# Patient Record
Sex: Female | Born: 1945
Health system: Southern US, Community
[De-identification: ages and names within clinical notes are randomized; demographics above are authoritative.]

## PROBLEM LIST (undated history)

## (undated) DIAGNOSIS — J9610 Chronic respiratory failure, unspecified whether with hypoxia or hypercapnia: Secondary | ICD-10-CM

## (undated) DIAGNOSIS — C801 Malignant (primary) neoplasm, unspecified: Secondary | ICD-10-CM

## (undated) DIAGNOSIS — E785 Hyperlipidemia, unspecified: Secondary | ICD-10-CM

## (undated) DIAGNOSIS — K529 Noninfective gastroenteritis and colitis, unspecified: Secondary | ICD-10-CM

## (undated) DIAGNOSIS — K219 Gastro-esophageal reflux disease without esophagitis: Secondary | ICD-10-CM

## (undated) DIAGNOSIS — J449 Chronic obstructive pulmonary disease, unspecified: Secondary | ICD-10-CM

## (undated) DIAGNOSIS — K449 Diaphragmatic hernia without obstruction or gangrene: Secondary | ICD-10-CM

## (undated) DIAGNOSIS — E039 Hypothyroidism, unspecified: Secondary | ICD-10-CM

## (undated) DIAGNOSIS — E876 Hypokalemia: Secondary | ICD-10-CM

## (undated) DIAGNOSIS — H269 Unspecified cataract: Secondary | ICD-10-CM

## (undated) DIAGNOSIS — I5032 Chronic diastolic (congestive) heart failure: Secondary | ICD-10-CM

## (undated) DIAGNOSIS — I471 Supraventricular tachycardia: Secondary | ICD-10-CM

## (undated) DIAGNOSIS — K222 Esophageal obstruction: Principal | ICD-10-CM

## (undated) DIAGNOSIS — R0902 Hypoxemia: Secondary | ICD-10-CM

## (undated) DIAGNOSIS — K5100 Ulcerative (chronic) pancolitis without complications: Secondary | ICD-10-CM

## (undated) DIAGNOSIS — R609 Edema, unspecified: Secondary | ICD-10-CM

## (undated) HISTORY — PX: CARPAL TUNNEL RELEASE: SHX101

## (undated) HISTORY — DX: Hyperlipidemia, unspecified: E78.5

## (undated) HISTORY — DX: Unspecified cataract: H26.9

## (undated) HISTORY — PX: UPPER GASTROINTESTINAL ENDOSCOPY: SHX188

## (undated) HISTORY — DX: Diaphragmatic hernia without obstruction or gangrene: K44.9

## (undated) HISTORY — DX: Chronic respiratory failure, unspecified whether with hypoxia or hypercapnia: J96.10

## (undated) HISTORY — DX: Esophageal obstruction: K22.2

## (undated) HISTORY — DX: Hypoxemia: R09.02

## (undated) HISTORY — DX: Gastro-esophageal reflux disease without esophagitis: K21.9

## (undated) HISTORY — DX: Ulcerative (chronic) pancolitis without complications: K51.00

## (undated) HISTORY — PX: CHOLECYSTECTOMY: SHX55

## (undated) HISTORY — PX: TONSILLECTOMY: SUR1361

## (undated) HISTORY — PX: LEG SURGERY: SHX1003

## (undated) HISTORY — DX: Hypothyroidism, unspecified: E03.9

## (undated) HISTORY — DX: Chronic obstructive pulmonary disease, unspecified: J44.9

---

## 2004-11-12 ENCOUNTER — Ambulatory Visit: Payer: Self-pay | Admitting: Family Medicine

## 2004-11-12 ENCOUNTER — Inpatient Hospital Stay (HOSPITAL_COMMUNITY): Admission: EM | Admit: 2004-11-12 | Discharge: 2004-11-15 | Payer: Self-pay | Admitting: Emergency Medicine

## 2004-11-12 IMAGING — CR DG CHEST 2V
2 series · 2 of 2 positions shown · non-contrast
Comparison: None.

CLINICAL DATA: Short of breath, cough. 
 CHEST ? 2 VIEW:

[w chest pa]
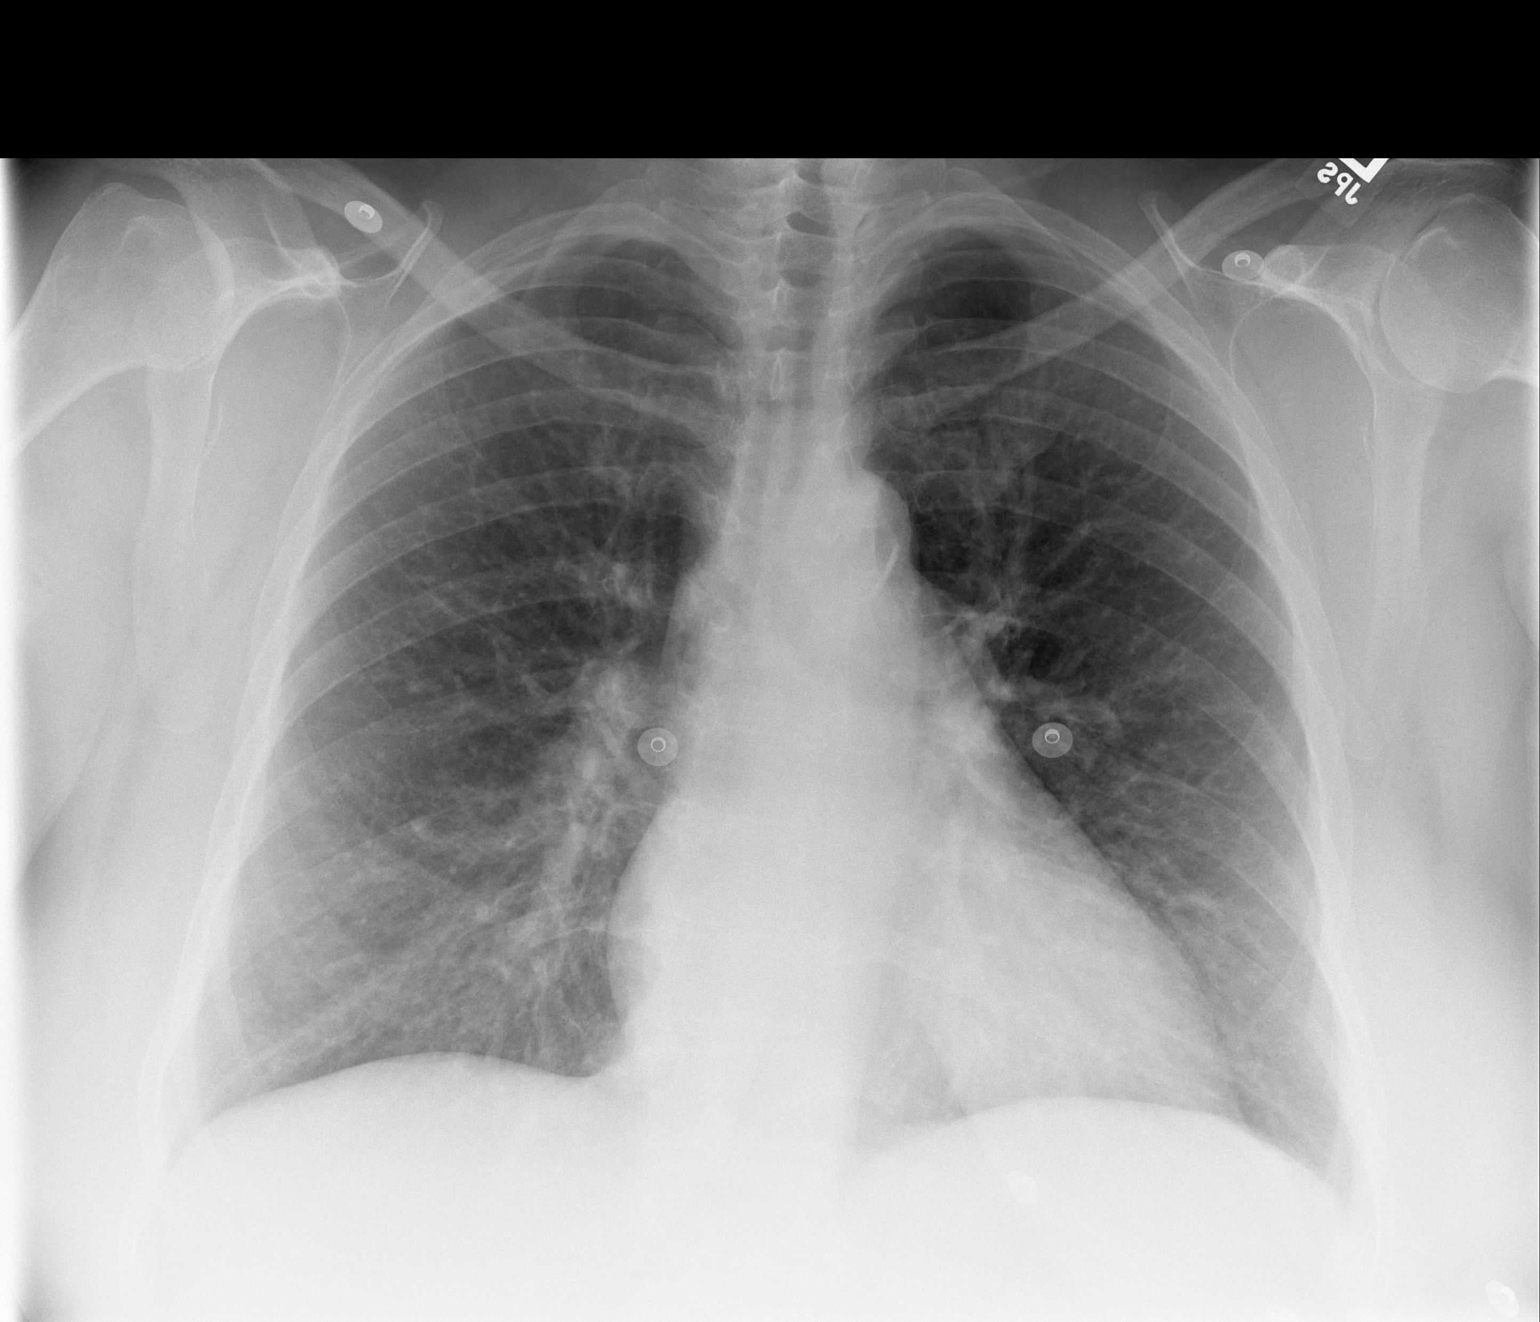

[w chest lat]
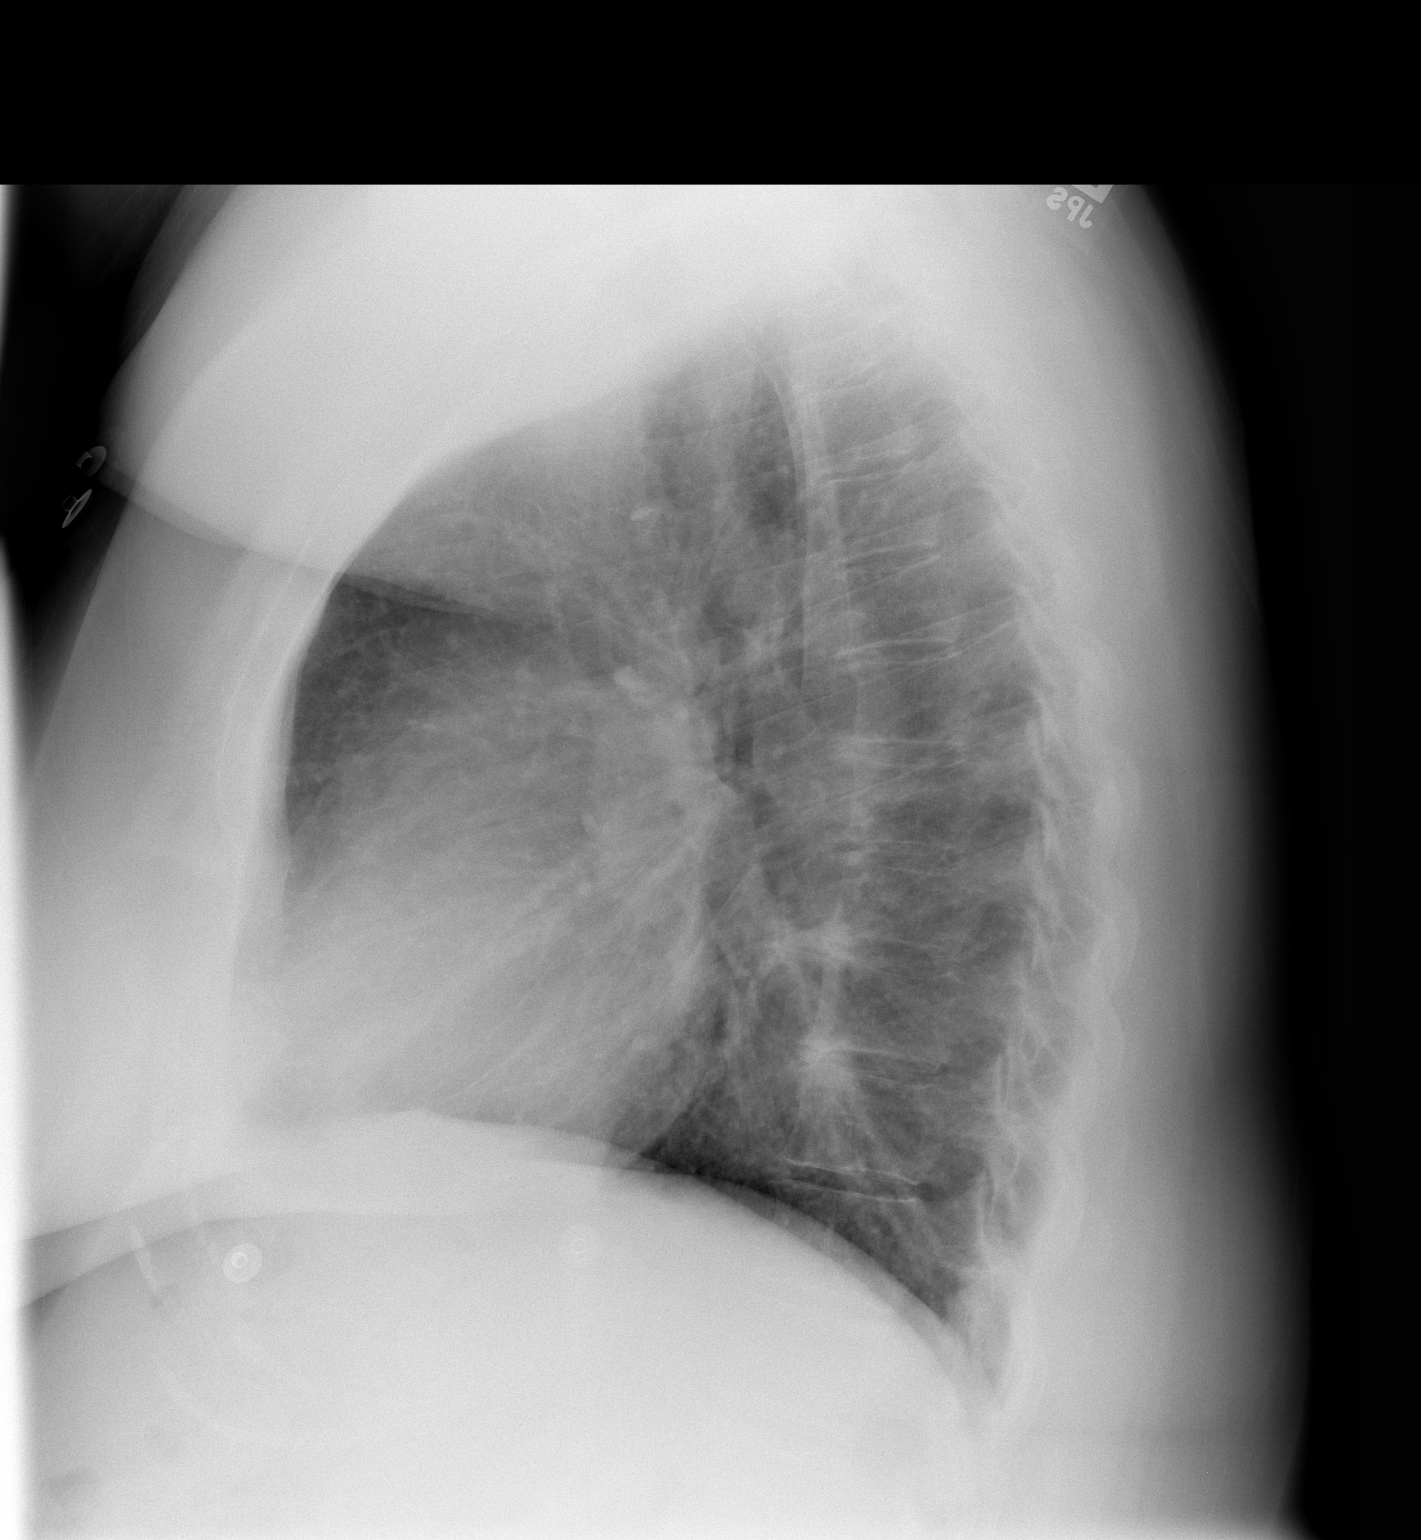

[2 of 2 positions shown; findings below may reference images not displayed]

Heart size upper normal.  There is peribronchial thickening without definite acute airspace disease or pleural fluid.  There is calcification in the aortic arch.
IMPRESSION: Borderline cardiomegaly and bronchitic changes ? no active airspace disease or congestive heart failure.

## 2010-08-29 ENCOUNTER — Encounter (INDEPENDENT_AMBULATORY_CARE_PROVIDER_SITE_OTHER): Payer: Self-pay | Admitting: *Deleted

## 2010-09-04 NOTE — Letter (Signed)
Summary: New Patient letter  [facility]  [place], [place] [address]   Phone: [phone]  Fax: [phone]       08/29/2010 MRN: [id number]  Jamie Montoya [address] [place], [place]  [address]  Dear Jamie Montoya,  Welcome to the Gastroenterology Division at [facility].    You are scheduled to see Dr.  Carlean Purl on 09-06-10 at 10:00A.M. on the 3rd floor at [facility] [employer].  We ask that you try to arrive at our office 15 minutes prior to your appointment time to allow for check-in.  We would like you to complete the enclosed self-administered evaluation form prior to your visit and bring it with you on the day of your appointment.  We will review it with you.  Also, please bring a complete list of all your medications or, if you prefer, bring the medication bottles and we will list them.  Please bring your insurance card so that we may make a copy of it.  If your insurance requires a referral to see a specialist, please bring your referral form from your primary care physician.  Co-payments are due at the time of your visit and may be paid by cash, check or credit card.     Your office visit will consist of a consult with your physician (includes a physical exam), any laboratory testing he/she may order, scheduling of any necessary diagnostic testing (e.g. x-ray, ultrasound, CT-scan), and scheduling of a procedure (e.g. Endoscopy, Colonoscopy) if required.  Please allow enough time on your schedule to allow for any/all of these possibilities.    If you cannot keep your appointment, please call [phone] to cancel or reschedule prior to your appointment date.  This allows [place] the opportunity to schedule an appointment for another patient in need of care.  If you do not cancel or reschedule by 5 p.m. the business day prior to your appointment date, you will be charged a $50.00 late cancellation/no-show fee.    Thank you for choosing [facility] for your medical needs.  We appreciate the opportunity to care for you.  Please visit [place] at our website  to learn more about our practice.                     Sincerely,                                                             The Gastroenterology Division

## 2010-09-06 ENCOUNTER — Ambulatory Visit (INDEPENDENT_AMBULATORY_CARE_PROVIDER_SITE_OTHER): Payer: Medicare Other | Admitting: Internal Medicine

## 2010-09-06 ENCOUNTER — Other Ambulatory Visit: Payer: Medicare Other

## 2010-09-06 ENCOUNTER — Other Ambulatory Visit: Payer: Self-pay | Admitting: Internal Medicine

## 2010-09-06 ENCOUNTER — Encounter: Payer: Self-pay | Admitting: Internal Medicine

## 2010-09-06 DIAGNOSIS — K222 Esophageal obstruction: Secondary | ICD-10-CM | POA: Insufficient documentation

## 2010-09-06 DIAGNOSIS — R1319 Other dysphagia: Secondary | ICD-10-CM

## 2010-09-06 DIAGNOSIS — K5190 Ulcerative colitis, unspecified, without complications: Secondary | ICD-10-CM | POA: Insufficient documentation

## 2010-09-06 DIAGNOSIS — J449 Chronic obstructive pulmonary disease, unspecified: Secondary | ICD-10-CM | POA: Insufficient documentation

## 2010-09-06 DIAGNOSIS — K921 Melena: Secondary | ICD-10-CM

## 2010-09-06 DIAGNOSIS — E66813 Obesity, class 3: Secondary | ICD-10-CM | POA: Insufficient documentation

## 2010-09-06 LAB — CBC WITH DIFFERENTIAL/PLATELET
Basophils Relative: 0.8 % (ref 0.0–3.0)
Eosinophils Absolute: 0.2 10*3/uL (ref 0.0–0.7)
Eosinophils Relative: 2.9 % (ref 0.0–5.0)
Lymphs Abs: 1.8 10*3/uL (ref 0.7–4.0)
MCHC: 33.6 g/dL (ref 30.0–36.0)
Monocytes Relative: 9.7 % (ref 3.0–12.0)
Neutro Abs: 2.9 10*3/uL (ref 1.4–7.7)
WBC: 5.4 10*3/uL (ref 4.5–10.5)

## 2010-09-10 ENCOUNTER — Encounter (AMBULATORY_SURGERY_CENTER): Payer: Medicare Other | Admitting: Internal Medicine

## 2010-09-10 ENCOUNTER — Encounter: Payer: Self-pay | Admitting: Internal Medicine

## 2010-09-10 ENCOUNTER — Other Ambulatory Visit: Payer: Self-pay | Admitting: Internal Medicine

## 2010-09-10 DIAGNOSIS — K222 Esophageal obstruction: Secondary | ICD-10-CM

## 2010-09-10 DIAGNOSIS — K5289 Other specified noninfective gastroenteritis and colitis: Secondary | ICD-10-CM

## 2010-09-10 DIAGNOSIS — D126 Benign neoplasm of colon, unspecified: Secondary | ICD-10-CM

## 2010-09-10 DIAGNOSIS — K219 Gastro-esophageal reflux disease without esophagitis: Secondary | ICD-10-CM

## 2010-09-10 DIAGNOSIS — K449 Diaphragmatic hernia without obstruction or gangrene: Secondary | ICD-10-CM

## 2010-09-10 DIAGNOSIS — K5100 Ulcerative (chronic) pancolitis without complications: Secondary | ICD-10-CM

## 2010-09-10 DIAGNOSIS — K625 Hemorrhage of anus and rectum: Secondary | ICD-10-CM

## 2010-09-10 DIAGNOSIS — R1319 Other dysphagia: Secondary | ICD-10-CM

## 2010-09-10 HISTORY — DX: Gastro-esophageal reflux disease without esophagitis: K21.9

## 2010-09-10 HISTORY — DX: Esophageal obstruction: K22.2

## 2010-09-10 HISTORY — DX: Ulcerative (chronic) pancolitis without complications: K51.00

## 2010-09-10 HISTORY — PX: ESOPHAGOGASTRODUODENOSCOPY: SHX1529

## 2010-09-10 HISTORY — DX: Diaphragmatic hernia without obstruction or gangrene: K44.9

## 2010-09-10 HISTORY — PX: COLONOSCOPY: SHX174
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
## 2010-09-11 NOTE — Letter (Signed)
Summary: [facility] Instructions  [facility]  [place], [place] [address]   Phone: [phone]  Fax: [phone]       Jamie Montoya    [birth year]    MRN: [id number]        Procedure Day /Date: Monday March 19th, 2012     Arrival Time: 2:00pm     Procedure Time: 3:00pm     Location of Procedure:                    _ x_  [place] (4th Floor)                        [place]   Starting 5 days prior to your procedure today do not eat nuts, seeds, popcorn, corn, beans, peas,  salads, or any raw vegetables.  Do not take any fiber supplements (e.g. Metamucil, Citrucel, and Benefiber).  THE DAY BEFORE YOUR PROCEDURE         DATE: 09/09/10  DAY: Sunday  1.  Drink clear liquids the entire day-NO SOLID FOOD  2.  Do not drink anything colored red or purple.  Avoid juices with pulp.  No orange juice.  3.  Drink at least 64 oz. (8 glasses) of fluid/clear liquids during the day to prevent dehydration and help the prep work efficiently.  CLEAR LIQUIDS INCLUDE: Water Jello Ice Popsicles Tea (sugar ok, no milk/cream) Powdered fruit flavored drinks Coffee (sugar ok, no milk/cream) Gatorade Juice: apple, white grape, white cranberry  Lemonade Clear bullion, consomm, broth Carbonated beverages (any kind) Strained chicken noodle soup Hard Candy                             4.  In the morning, mix first dose of MoviPrep solution:    Empty 1 Pouch A and 1 Pouch B into the disposable container    Add lukewarm drinking water to the top line of the container. Mix to dissolve    Refrigerate (mixed solution should be used within 24 hrs)  5.  Begin drinking the prep at 5:00 p.m. The MoviPrep container is divided by 4 marks.   Every 15 minutes drink the solution down to the next mark (approximately 8 oz) until the full liter is complete.   6.  Follow completed prep with 16 oz of clear liquid of your choice (Nothing  red or purple).  Continue to drink clear liquids until bedtime.  7.  Before going to bed, mix second dose of MoviPrep solution:    Empty 1 Pouch A and 1 Pouch B into the disposable container    Add lukewarm drinking water to the top line of the container. Mix to dissolve    Refrigerate  THE DAY OF YOUR PROCEDURE      DATE: 09/10/10 DAY: Monday Beginning at 10:00 a.m. (5 hours before procedure):         1. Every 15 minutes, drink the solution down to the next mark (approx 8 oz) until the full liter is complete.  2. Follow completed prep with 16 oz. of clear liquid of your choice.    3. You may drink clear liquids until 1:00pm (2 HOURS BEFORE PROCEDURE).   MEDICATION INSTRUCTIONS  Unless otherwise instructed, you should take regular prescription medications with a small sip of water   as early as possible the morning of your procedure.  OTHER INSTRUCTIONS  You will need a responsible adult at least 67 years of age to accompany you and drive you home.   This person must remain in the waiting room during your procedure.  Wear loose fitting clothing that is easily removed.  Leave jewelry and other valuables at home.  However, you may wish to bring a book to read or  an iPod/MP3 player to listen to music as you wait for your procedure to start.  Remove all body piercing jewelry and leave at home.  Total time from sign-in until discharge is approximately 2-3 hours.  You should go home directly after your procedure and rest.  You can resume normal activities the  day after your procedure.  The day of your procedure you should not:   Drive   Make legal decisions   Operate machinery   Drink alcohol   Return to work  You will receive specific instructions about eating, activities and medications before you leave.    The above instructions have been reviewed and explained to me by   _______________________    I fully understand and can verbalize these  instructions _____________________________ Date _________

## 2010-09-20 ENCOUNTER — Encounter: Payer: Self-pay | Admitting: Internal Medicine

## 2010-09-20 ENCOUNTER — Telehealth: Payer: Self-pay | Admitting: Internal Medicine

## 2010-09-20 DIAGNOSIS — K5190 Ulcerative colitis, unspecified, without complications: Secondary | ICD-10-CM

## 2010-09-20 DIAGNOSIS — K219 Gastro-esophageal reflux disease without esophagitis: Secondary | ICD-10-CM

## 2010-09-20 DIAGNOSIS — K222 Esophageal obstruction: Secondary | ICD-10-CM | POA: Insufficient documentation

## 2010-09-20 NOTE — Procedures (Signed)
Summary: Colonoscopy  Patient: [name] Note: All result statuses are Final unless otherwise noted.  Tests: (1) Colonoscopy (COL)   COL Colonoscopy           [facility]     [place], [place]  [address]          COLONOSCOPY PROCEDURE REPORT          PATIENT:  Jamie Montoya, Jamie Montoya  MR#:  [id number]     BIRTHDATE:  08-16-1945, 41 yrs. old  GENDER:  female     ENDOSCOPIST:  Gatha Mayer, MD, [facility]          PROCEDURE DATE:  09/10/2010     PROCEDURE:  Colonoscopy with biopsy and snare polypectomy     ASA CLASS:  Class III     INDICATIONS:  hematochezia     MEDICATIONS:   There was residual sedation effect present from     prior procedure., MAC sedation, administered by CRNA, propofol     (Diprivan) 220 mg IV          DESCRIPTION OF PROCEDURE:   After the risks benefits and     alternatives of the procedure were thoroughly explained, informed     consent was obtained.  Digital rectal exam was performed and     revealed no abnormalities.   The LB 180AL [id number] endoscope was     introduced through the anus and advanced to the cecum, which was     identified by both the appendix and ileocecal valve, without     limitations.  The quality of the prep was excellent, using     MoviPrep.  The instrument was then slowly withdrawn as the colon     was fully examined. Insertion: 5:41 minutes and Withdrawal: 9:29     minutes     <<PROCEDUREIMAGES>>          FINDINGS:  Colitis was found in the rectum and sigmoid colon.     Confluent granularity and aphthous ulceration with loss of normal     mucosal pattern except for some patchy changes in rectum. From     rectum to 40 cm (sigmoid). Multiple biopsies were obtained and     sent to pathology.  A diminutive polyp was found in the mid     transverse colon. Polyp was snared without cautery. Retrieval was     successful. snare polyp  Mild diverticulosis was found throughout     the colon.  This was otherwise a  normal examination of the colon.     The ileocecal valve might have been inflamed, could not enter as     redundant colon effect in place with most of scope inserted. This     was biopsied also. Random biopsies of normal colon taken also.     Retroflexed views in the rectum revealed no other findings other     than those already described.    The scope was then withdrawn from     the patient and the procedure completed.          COMPLICATIONS:  None     ENDOSCOPIC IMPRESSION:     1) Colitis in the rectum and sigmoid colon     2) Diminutive polyp in the mid transverse colon     3) Mild diverticulosis throughout the colon     4) Otherwise normal examination except ? inflamed ileocecal  valve (ileum not entered due to redundant colon effect)          RECOMMENDATIONS:     1) Await pathology     2) looks like left-sided ulcerative colitis - start Asacol 800     mg three times a day - prescription sent     REPEAT EXAM:  In for Colonoscopy, pending biopsy results.          Gatha Mayer, MD, [facility]          CC:  Daiva Eves, MD and The Patient          n.     eSIGNED:   Gatha Mayer at 09/10/2010 04:16 PM          Wilburt Finlay, [id number]  Note: An exclamation mark (!) indicates a result that was not dispersed into the flowsheet. Document Creation Date: 09/10/2010 4:16 PM _______________________________________________________________________  (1) Order result status: Final Collection or observation date-time: 09/10/2010 15:57 Requested date-time:  Receipt date-time:  Reported date-time:  Referring Physician:   Ordering Physician: Silvano Rusk [phone]) Specimen Source:  Source: Tawanna Cooler Order Number: [id number] Lab site:

## 2010-09-20 NOTE — Miscellaneous (Signed)
Summary: omeprazole and asacol rxs  Clinical Lists Changes  Medications: Removed medication of MOVIPREP 100 GM  SOLR (PEG-KCL-NACL-NASULF-NA ASC-C) As per prep instructions. Added new medication of OMEPRAZOLE 40 MG CPDR (OMEPRAZOLE) 1 by mouth once daily 30 minutes before breakfast - Signed Added new medication of ASACOL 400 MG TBEC (MESALAMINE) 2 by mouth three times a day - Signed Rx of OMEPRAZOLE 40 MG CPDR (OMEPRAZOLE) 1 by mouth once daily 30 minutes before breakfast;  #30 x 11;  Signed;  Entered by: Gatha Mayer MD, [facility];  Authorized by: Gatha Mayer MD, [facility];  Method used: Electronically to [facility] [address], [place], [place], [place]  [address], Ph: [phone], Fax: [phone] Rx of ASACOL 400 MG TBEC (MESALAMINE) 2 by mouth three times a day;  #180 x 11;  Signed;  Entered by: Gatha Mayer MD, [facility];  Authorized by: Gatha Mayer MD, [facility];  Method used: Electronically to [facility] [address], [place], [place], [place]  [address], Ph: [phone], Fax: [phone]    Prescriptions: ASACOL 400 MG TBEC (MESALAMINE) 2 by mouth three times a day  #180 x 11   Entered and Authorized by:   Gatha Mayer MD, [facility]   Signed by:   Gatha Mayer MD, [facility] on 09/10/2010   Method used:   Electronically to        [facility] (retail)       [address]       [place], [place]  [address]       Ph: [phone]       Fax: [phone]   RxID:   [id number] OMEPRAZOLE 40 MG CPDR (OMEPRAZOLE) 1 by mouth once daily 30 minutes before breakfast  #30 x 11   Entered and Authorized by:   Gatha Mayer MD, [facility]   Signed by:   Gatha Mayer MD, FACG on 09/10/2010   Method used:   Electronically to        [facility] (retail)       [address]       [place], [place]  [address]       Ph: [phone]       Fax: [phone]   RxID:   [id number]

## 2010-09-20 NOTE — Telephone Encounter (Signed)
RN to call patient and let her know that  Biopsies confirm ulcerative colitis. Stay on Asacol Schedule REV with Dr. Carlean Purl for 6 weeks or so. Let us know if problems before then, bleeding should resolve over time with this treatment Colon polyp is benign and she will need a colonoscopy recall in 3 years for this. Once we call her need to let [place] Ernestine Conrad know) and get the recall placed (3 years for colonoscopy)

## 2010-09-20 NOTE — Procedures (Addendum)
Summary: Upper Endoscopy w/DIL  Patient: Jamie Montoya Note: All result statuses are Final unless otherwise noted.  Tests: (1) Upper Endoscopy w/DIL (UED)  UED Upper Endoscopy w/DIL                             [facility].     [place], [place]  [address]          ENDOSCOPY PROCEDURE REPORT          PATIENT:  Jamie, Montoya  MR#:  [id number]     BIRTHDATE:  1946-02-03, 72 yrs. old  GENDER:  female          ENDOSCOPIST:  Gatha Mayer, MD, [facility]          Referred by:  Self          PROCEDURE DATE:  09/10/2010     PROCEDURE:  EGD, diagnostic, Maloney Dilation of the Esophagus     ASA CLASS:  Class III     INDICATIONS:  1) dysphagia          MEDICATIONS:   MAC sedation, administered by CRNA, propofol     (Diprivan) 180 mg IV     TOPICAL ANESTHETIC:  Exactacain Spray          DESCRIPTION OF PROCEDURE:   After the risks benefits and     alternatives of the procedure were thoroughly explained, informed     consent was obtained.  The LB GIF-H180 [id number] endoscope was     introduced through the mouth and advanced to the second portion of     the duodenum, without limitations.  The instrument was slowly     withdrawn as the mucosa was carefully examined.     <<PROCEDUREIMAGES>>          A stricture was found in the distal esophagus.15 mm diameter     estimated. Associated inflammatory changes.  A hiatal hernia was     found. It was 1 - 2 cm in size.  The examination was otherwise     normal.    Dilation was then performed at the distal esophagus     1) Dilator:  Venia Minks  Size(s):  50 and 54 [place], sequential     Resistance:  minimal  Heme:  none          COMPLICATIONS:  None          ENDOSCOPIC IMPRESSION:     1) Stricture (inflammatory from GERD) in the distal esophagus -     dilated to 54 [place]     2) 1 - 2 cm hiatal hernia     3) Otherwise normal examination.     RECOMMENDATIONS:     1) Clear liquids until 5PM then soft foods  today     2) Normal consistency foods tomorrow     3) START OMEPRAZOLE 40 MG EVERY MORNING 30 MINUTES BEFORE     BREAKFAST, PRESCRIPTION SENT     4) see colonoscopy report also          REPEAT EXAM:  In for as needed.          Gatha Mayer, MD, [facility]          CC:  Daiva Eves, MD     The Patient          n.     eSIGNED:  Gatha Mayer at 09/10/2010 04:05 PM          Wilburt Finlay, [id number]  Note: An exclamation mark (!) indicates a result that was not dispersed into the flowsheet. Document Creation Date: 09/19/2010 10:03 AM _______________________________________________________________________  (1) Order result status: Final Collection or observation date-time: 09/10/2010 15:35 Requested date-time:  Receipt date-time:  Reported date-time:  Referring Physician:   Ordering Physician: Silvano Rusk [phone]) Specimen Source:  Source: Tawanna Cooler Order Number: [id number] Lab site:

## 2010-09-21 ENCOUNTER — Encounter: Payer: Self-pay | Admitting: Internal Medicine

## 2010-09-21 NOTE — Telephone Encounter (Signed)
I have advised the patient of Dr Celesta Aver recommendations and she will call back if she is not continuing to improve after a few weeks.

## 2010-09-21 NOTE — Telephone Encounter (Signed)
Diarrhea should gradually resolve Asacol is a great drug with few side effects We may be able to switch it but want her to stay with it for now and we can talk about changing to possible cheaper rx when she returns- her ulcerative colitis will require chronic daily medication to control and reduce cancer risl

## 2010-09-21 NOTE — Telephone Encounter (Signed)
Notified pt per Dr Carlean Purl, the biopies confirm UC and she needs to remain on Asacol. She was also scheduled for a f/u on 10/29/10 @ 1045am. She is to call us for problems before that appt and notified her the bleeding should resolve over time with the tx. She stated understanding when told the polyp was benign, but she will need a f/u COLON in 3 years- informed Ernestine Conrad in [facility]. Pt reports the bleeding is gone, but the diarrhea remains and starts soon after she eats. Pt is concerned with the co pay on her Asacol, but stated the Omeprazole is working great. Dr Carlean Purl, pt wants to know: 1) will she have to remain on Asacol forever?  2) Will the diarrhea clear with the Asacol?- no real compacted stool ever, just really loose.

## 2010-09-25 NOTE — Letter (Signed)
Summary: Appt Reminder [facility]  [place], [place] [address]   Phone: [phone]  Fax: [phone]        September 21, 2010 MRN: [id number]    Jamie Montoya [address] [place], [place]  [address]    Dear Ms. Ronnald Ramp,   You have a return appointment with Dr. Carlean Purl on Oct 29, 2010 at 10:45 am.  Please remember to bring a complete list of the medicines you are taking, your insurance card and your co-pay.  If you have to cancel or reschedule this appointment, please call before 5:00 pm the evening before to avoid a cancellation fee.  If you have any questions or concerns, please call [phone].    Sincerely,    Shella Maxim RN

## 2010-10-29 ENCOUNTER — Other Ambulatory Visit (INDEPENDENT_AMBULATORY_CARE_PROVIDER_SITE_OTHER): Payer: Medicare Other

## 2010-10-29 ENCOUNTER — Encounter: Payer: Self-pay | Admitting: Internal Medicine

## 2010-10-29 ENCOUNTER — Ambulatory Visit (INDEPENDENT_AMBULATORY_CARE_PROVIDER_SITE_OTHER): Payer: Medicare Other | Admitting: Internal Medicine

## 2010-10-29 VITALS — BP 104/68 | HR 80 | Ht 64.5 in | Wt 327.0 lb

## 2010-10-29 DIAGNOSIS — K219 Gastro-esophageal reflux disease without esophagitis: Secondary | ICD-10-CM

## 2010-10-29 DIAGNOSIS — R5383 Other fatigue: Secondary | ICD-10-CM

## 2010-10-29 DIAGNOSIS — K5100 Ulcerative (chronic) pancolitis without complications: Secondary | ICD-10-CM

## 2010-10-29 DIAGNOSIS — R5381 Other malaise: Secondary | ICD-10-CM

## 2010-10-29 DIAGNOSIS — K222 Esophageal obstruction: Secondary | ICD-10-CM

## 2010-10-29 DIAGNOSIS — Z79899 Other long term (current) drug therapy: Secondary | ICD-10-CM

## 2010-10-29 LAB — VITAMIN B12: Vitamin B-12: 725 pg/mL (ref 211–911)

## 2010-10-29 MED ORDER — LOPERAMIDE HCL 2 MG PO TABS: ORAL_TABLET | ORAL | Status: DC

## 2010-10-29 MED ORDER — MESALAMINE 400 MG PO TBEC: DELAYED_RELEASE_TABLET | ORAL | Status: DC

## 2010-10-29 NOTE — Assessment & Plan Note (Signed)
Better as faras cramps and bleeding but still diarrhea increse asacol to 1600 mg tid Rev 6 weeks Check TSH loperamide

## 2010-10-29 NOTE — Assessment & Plan Note (Signed)
improved

## 2010-10-29 NOTE — Patient Instructions (Signed)
You will go to the basement for labs today We have increased your Asacol Please schedule a 6 week follow up appointment with Dr Carlean Purl

## 2010-10-29 NOTE — Progress Notes (Signed)
  Subjective:    Patient ID: Jamie Montoya, female    DOB: 06-05-1946, 65 y.o.   MRN: [id number]  HPI 65 yo ww here to follow-up GERD with dysphagia and stricture and ulcerative colitis.  No dysphagia, no heartburn on omeprazle 40 mg daily started after EGD in March 2012.  Is still having loose stools, 10-15 times a day but no blood. No formed stools. Less abdominal cramps, some nausea. Is on Asacol 800 mg tid.  The past medical history, past surgical history, social history, family history, medications and allergies were reviewed and updated in the EMR database.    Review of Systems As per history of present illness. She also complains of persistent fatigue. She thinks her last TSH test was in the fall of 2011.    Objective:   Physical Exam Obese no acute distress Eyes anicteric Lungs clear anteriorly Heart S1-S2 no rubs murmurs or gallops The abdomen is obese soft and nontender without obvious organomegaly or mass     Assessment & Plan:

## 2010-10-29 NOTE — Assessment & Plan Note (Signed)
Improved after dili and PPI

## 2010-10-30 NOTE — Progress Notes (Signed)
Quick Note:  Let her know B12 and TSH are ok I have cced Dr. Lisbeth Ply ______

## 2010-12-07 ENCOUNTER — Ambulatory Visit: Payer: Medicare Other | Admitting: Internal Medicine

## 2011-01-15 ENCOUNTER — Ambulatory Visit (INDEPENDENT_AMBULATORY_CARE_PROVIDER_SITE_OTHER): Payer: Medicare Other | Admitting: Internal Medicine

## 2011-01-15 ENCOUNTER — Encounter: Payer: Self-pay | Admitting: Internal Medicine

## 2011-01-15 DIAGNOSIS — K5190 Ulcerative colitis, unspecified, without complications: Secondary | ICD-10-CM

## 2011-01-15 DIAGNOSIS — K222 Esophageal obstruction: Secondary | ICD-10-CM

## 2011-01-15 DIAGNOSIS — K219 Gastro-esophageal reflux disease without esophagitis: Secondary | ICD-10-CM

## 2011-01-15 NOTE — Patient Instructions (Signed)
Continue current medications. Return to see Dr. Carlean Purl in 4 months.

## 2011-01-15 NOTE — Assessment & Plan Note (Signed)
No dysphagia at this time.

## 2011-01-15 NOTE — Assessment & Plan Note (Signed)
Asymptomatic - continue PPI ? Dose reduction later

## 2011-01-15 NOTE — Progress Notes (Signed)
  This is a 65 year old white woman with ulcerative colitis diagnosed earlier this year. She did not respond initially to overdose the sounding but on 4.8 g daily she appears relatively asymptomatic for the past several weeks. No loperamide used lately. She has no dysphagia and her heartburn is controlled. 15 minutes time spent discussing the patient's illness is with her. She is currently in the Medicare doughnut hole which is causing some problems, we worked at possibly getting meds through [place] but that was not necessarily cheaper for her. She is asking about possible reduced dose of mesalamine in the future.

## 2011-01-15 NOTE — Assessment & Plan Note (Addendum)
Doing well without diarrhea on Asacol 4.8 grams/day. Continue this. She is in donut hole and it is problematic. Still cheaper here than Meds via [place] she says (we checked). Return in 4 months  Asacol was denied Will try sulfasalazine 100 mg qid Check cbc in 2 weeks after starting

## 2011-01-30 MED ORDER — SULFASALAZINE 500 MG PO TABS: ORAL_TABLET | ORAL | Status: DC

## 2011-01-30 NOTE — Progress Notes (Signed)
Addended by: Gatha Mayer on: 01/30/2011 05:03 PM   Modules accepted: Orders

## 2011-07-17 ENCOUNTER — Other Ambulatory Visit: Payer: Self-pay | Admitting: Gastroenterology

## 2011-07-17 DIAGNOSIS — K5190 Ulcerative colitis, unspecified, without complications: Secondary | ICD-10-CM

## 2011-07-17 MED ORDER — SULFASALAZINE 500 MG PO TABS: 1000.0000 mg | ORAL_TABLET | Freq: Four times a day (QID) | ORAL | Status: DC

## 2011-07-17 NOTE — Telephone Encounter (Signed)
Received a faxed medication refill request from [facility]. Medication filled for 1 month with a note that patient needs an appointment.

## 2011-08-19 ENCOUNTER — Other Ambulatory Visit: Payer: Self-pay | Admitting: Internal Medicine

## 2011-09-24 ENCOUNTER — Other Ambulatory Visit: Payer: Self-pay | Admitting: Internal Medicine

## 2011-10-25 ENCOUNTER — Other Ambulatory Visit: Payer: Self-pay | Admitting: Internal Medicine

## 2011-10-25 DIAGNOSIS — K5190 Ulcerative colitis, unspecified, without complications: Secondary | ICD-10-CM

## 2011-10-25 MED ORDER — SULFASALAZINE 500 MG PO TABS: 1000.0000 mg | ORAL_TABLET | Freq: Four times a day (QID) | ORAL | Status: DC

## 2011-10-25 MED ORDER — OMEPRAZOLE 40 MG PO CPDR: 40.0000 mg | DELAYED_RELEASE_CAPSULE | Freq: Every day | ORAL | Status: DC

## 2011-10-25 NOTE — Telephone Encounter (Signed)
Pt has been scheduled an ROV and was told no further refills until seen in the office

## 2011-11-11 ENCOUNTER — Telehealth: Payer: Self-pay | Admitting: Internal Medicine

## 2011-11-11 NOTE — Telephone Encounter (Signed)
Received copies from [facility] 11/11/11. Forwarded 3  pages to Dr. Carlean Purl ,for review.

## 2011-11-12 ENCOUNTER — Encounter: Payer: Self-pay | Admitting: *Deleted

## 2011-11-13 ENCOUNTER — Ambulatory Visit (INDEPENDENT_AMBULATORY_CARE_PROVIDER_SITE_OTHER): Payer: Medicare Other | Admitting: Internal Medicine

## 2011-11-13 ENCOUNTER — Encounter: Payer: Self-pay | Admitting: Internal Medicine

## 2011-11-13 VITALS — BP 124/64 | HR 80 | Ht 64.5 in | Wt 372.0 lb

## 2011-11-13 DIAGNOSIS — K5190 Ulcerative colitis, unspecified, without complications: Secondary | ICD-10-CM

## 2011-11-13 DIAGNOSIS — L989 Disorder of the skin and subcutaneous tissue, unspecified: Secondary | ICD-10-CM

## 2011-11-13 DIAGNOSIS — K219 Gastro-esophageal reflux disease without esophagitis: Secondary | ICD-10-CM

## 2011-11-13 DIAGNOSIS — Z860100 Personal history of colon polyps, unspecified: Secondary | ICD-10-CM | POA: Insufficient documentation

## 2011-11-13 MED ORDER — ESOMEPRAZOLE MAGNESIUM 40 MG PO PACK: 40.0000 mg | PACK | Freq: Every day | ORAL | Status: DC

## 2011-11-13 MED ORDER — TRIAMTERENE-HCTZ 37.5-25 MG PO CAPS: 1.0000 | ORAL_CAPSULE | Freq: Every day | ORAL | Status: DC

## 2011-11-13 MED ORDER — OMEPRAZOLE 40 MG PO CPDR: 40.0000 mg | DELAYED_RELEASE_CAPSULE | Freq: Every day | ORAL | Status: DC

## 2011-11-13 MED ORDER — SULFASALAZINE 500 MG PO TABS: 1000.0000 mg | ORAL_TABLET | Freq: Three times a day (TID) | ORAL | Status: DC

## 2011-11-13 NOTE — Patient Instructions (Signed)
We have sent the following medications to your pharmacy for you to pick up at your convenience: Generic Prilosec, samples given today.   Come Oct. 7th and go to the lab for a CBC to be drawn.  They are open 7:30am-5pm.  Please let your PCP see your knee lesion.

## 2011-11-13 NOTE — Progress Notes (Signed)
  Subjective:    Patient ID: Jamie Montoya, female    DOB: 13-Oct-1945, 66 y.o.   MRN: [id number]  HPI 66 year old white woman with ulcerative colitis and history of adenomatous colon polyp diagnosed in 2012. Originally started on Asacol but was not on her formulary so switch to sulfasalazine. She self reduced the dose from 1000 mg 4 times a day to 3 times a day. She has been fine without diarrhea or bleeding. She is on omeprazole 40 mg daily, currently has run out. She has significant heartburn she does not take this. She has a history of esophageal stricture and dilation, there is no dysphagia at this time.  Medications, allergies, past medical history, past surgical history, family history and social history are reviewed and updated in the EMR.  Review of Systems Skin lesion, ? Elevated blood pressure at last PCP visit    Objective:   Physical Exam General:  NAD - morbidly obese Eyes:   anicteric Lungs:  clear Heart:  S1S2 no rubs, murmurs or gallops Abdomen:  soft and nontender, BS+, obese Skin:  Pink, erythematous round papule on left inner leg below knee     Data Reviewed: Laboratory testing from 11/06/2011, comprehensive metabolic panel is normal. TSH is 24, her thyroid medication was adjusted. Her hemoglobin was 13 white count 5.4 and normal platelets at 198.         Assessment & Plan:   1. Ulcerative colitis, chronic   Doing well, continue sulfasalazine, return in one year. CBC in October as there are risks of leukopenia and hematologic abnormalities on sulfasalazine.   2. GERD with stricture   Refill omeprazole 40 mg daily. May order prescription sent. Since she is out a few samples of Nexium 40 mg a day or given. Weight loss again advised.   3. Skin lesion of left leg   I have advised she return to primary care for evaluation of this. She was there recently but says she simply forgot to mention it.     CC: Leonides Sake, MD

## 2011-11-13 NOTE — Assessment & Plan Note (Addendum)
EGD and dilation 2012 Doing well on omeprazole 40 mg daily - refilled Advised weight loss.

## 2011-11-13 NOTE — Assessment & Plan Note (Signed)
Doing well on sulfasalazine 1000 mg tid. Continue REV 1 year CBC in October 2013

## 2012-03-04 ENCOUNTER — Other Ambulatory Visit (INDEPENDENT_AMBULATORY_CARE_PROVIDER_SITE_OTHER): Payer: Medicare Other

## 2012-03-04 ENCOUNTER — Other Ambulatory Visit: Payer: Self-pay

## 2012-03-04 DIAGNOSIS — K5190 Ulcerative colitis, unspecified, without complications: Secondary | ICD-10-CM

## 2012-03-04 DIAGNOSIS — D649 Anemia, unspecified: Secondary | ICD-10-CM

## 2012-03-04 LAB — CBC WITH DIFFERENTIAL/PLATELET
Basophils Absolute: 0 10*3/uL (ref 0.0–0.1)
Basophils Relative: 0.5 % (ref 0.0–3.0)
HCT: 44.9 % (ref 36.0–46.0)
Monocytes Relative: 10.4 % (ref 3.0–12.0)
Neutrophils Relative %: 63.1 % (ref 43.0–77.0)
Platelets: 171 10*3/uL (ref 150.0–400.0)
RBC: 4.91 Mil/uL (ref 3.87–5.11)
RDW: 15.7 % — ABNORMAL HIGH (ref 11.5–14.6)

## 2012-03-04 NOTE — Progress Notes (Signed)
Quick Note:  Let her know this is ok  Repeat CBC in 6 months  Also - double check that her MVI has 1 mg folate in it and if not needs to be on this qd ______

## 2012-06-02 ENCOUNTER — Telehealth: Payer: Self-pay | Admitting: Internal Medicine

## 2012-06-02 NOTE — Telephone Encounter (Signed)
Patient is not due for labs until March 2014.  CBC.

## 2012-09-10 ENCOUNTER — Other Ambulatory Visit (INDEPENDENT_AMBULATORY_CARE_PROVIDER_SITE_OTHER): Payer: Medicare Other

## 2012-09-10 DIAGNOSIS — D649 Anemia, unspecified: Secondary | ICD-10-CM

## 2012-09-10 LAB — CBC WITH DIFFERENTIAL/PLATELET
Basophils Absolute: 0 10*3/uL (ref 0.0–0.1)
Hemoglobin: 13.9 g/dL (ref 12.0–15.0)
Lymphocytes Relative: 19.2 % (ref 12.0–46.0)
Lymphs Abs: 1.4 10*3/uL (ref 0.7–4.0)
MCHC: 31.3 g/dL (ref 30.0–36.0)
Monocytes Absolute: 0.7 10*3/uL (ref 0.1–1.0)
Monocytes Relative: 10 % (ref 3.0–12.0)
Platelets: 193 10*3/uL (ref 150.0–400.0)
RBC: 5.06 Mil/uL (ref 3.87–5.11)
RDW: 15.9 % — ABNORMAL HIGH (ref 11.5–14.6)
WBC: 7.1 10*3/uL (ref 4.5–10.5)

## 2012-09-10 NOTE — Progress Notes (Signed)
Quick Note:  Labs are ok Please schedule an appointment to see me in April or May ______

## 2013-02-11 ENCOUNTER — Other Ambulatory Visit: Payer: Self-pay

## 2013-02-11 MED ORDER — OMEPRAZOLE 40 MG PO CPDR: 40.0000 mg | DELAYED_RELEASE_CAPSULE | Freq: Every day | ORAL | Status: DC

## 2013-08-17 ENCOUNTER — Other Ambulatory Visit: Payer: Self-pay

## 2013-08-17 MED ORDER — SULFASALAZINE 500 MG PO TABS: 1000.0000 mg | ORAL_TABLET | Freq: Three times a day (TID) | ORAL | Status: DC

## 2013-08-17 NOTE — Telephone Encounter (Signed)
Spoke with patient and made appointment for April 14th 2015 and sent in refill of her azulfidine to [facility] as requested.

## 2013-08-25 ENCOUNTER — Encounter: Payer: Self-pay | Admitting: Internal Medicine

## 2013-10-11 ENCOUNTER — Ambulatory Visit (INDEPENDENT_AMBULATORY_CARE_PROVIDER_SITE_OTHER): Payer: Medicare Other | Admitting: Internal Medicine

## 2013-10-11 ENCOUNTER — Other Ambulatory Visit (INDEPENDENT_AMBULATORY_CARE_PROVIDER_SITE_OTHER): Payer: Medicare Other

## 2013-10-11 ENCOUNTER — Encounter: Payer: Self-pay | Admitting: Internal Medicine

## 2013-10-11 VITALS — BP 124/82 | HR 80 | Ht 64.5 in | Wt 345.6 lb

## 2013-10-11 DIAGNOSIS — K219 Gastro-esophageal reflux disease without esophagitis: Secondary | ICD-10-CM

## 2013-10-11 DIAGNOSIS — K222 Esophageal obstruction: Secondary | ICD-10-CM

## 2013-10-11 DIAGNOSIS — K5190 Ulcerative colitis, unspecified, without complications: Secondary | ICD-10-CM

## 2013-10-11 LAB — CBC WITH DIFFERENTIAL/PLATELET
BASOS ABS: 0.1 10*3/uL (ref 0.0–0.1)
Basophils Relative: 0.8 % (ref 0.0–3.0)
EOS ABS: 0.1 10*3/uL (ref 0.0–0.7)
EOS PCT: 1.9 % (ref 0.0–5.0)
HEMATOCRIT: 40.2 % (ref 36.0–46.0)
HEMOGLOBIN: 12.9 g/dL (ref 12.0–15.0)
LYMPHS ABS: 1.8 10*3/uL (ref 0.7–4.0)
Lymphocytes Relative: 23.3 % (ref 12.0–46.0)
MCHC: 32.2 g/dL (ref 30.0–36.0)
MCV: 90.9 fl (ref 78.0–100.0)
MONO ABS: 0.8 10*3/uL (ref 0.1–1.0)
Monocytes Relative: 10.5 % (ref 3.0–12.0)
Neutro Abs: 4.9 10*3/uL (ref 1.4–7.7)
Neutrophils Relative %: 63.5 % (ref 43.0–77.0)
PLATELETS: 201 10*3/uL (ref 150.0–400.0)
RBC: 4.43 Mil/uL (ref 3.87–5.11)
RDW: 15.1 % — AB (ref 11.5–14.6)
WBC: 7.8 10*3/uL (ref 4.5–10.5)

## 2013-10-11 MED ORDER — SULFASALAZINE 500 MG PO TABS
1000.0000 mg | ORAL_TABLET | Freq: Three times a day (TID) | ORAL | Status: DC
Start: 2013-10-11 — End: 2014-06-28

## 2013-10-11 NOTE — Patient Instructions (Addendum)
Your physician has requested that you go to the basement for the following lab work before leaving today: CBC/diff  Please take all of sulfasalazine doses every day.  Take 3 twice a day if that's easier to remember.   I appreciate the opportunity to care for you.

## 2013-10-11 NOTE — Assessment & Plan Note (Signed)
Continue PPI ?

## 2013-10-11 NOTE — Progress Notes (Signed)
    Subjective:    Patient ID: Jamie Montoya, female    DOB: 02/06/1946, 68 y.o.   MRN: [id number]  HPI Pleasant woman with UC dx 2012 - treated w/ Asacol then sulfasalazine (cost issues) She has occ diarrhea, no bleeding or abd pain Missing second dose of sulfasalazine She tried to stop PPI but had terrible heartburn. No dysphagia  Review of Systems CHF admission last year    Objective:   Physical Exam General:  NAD, morbidly obese Eyes:   anicteric Lungs:  clear Heart:  S1S2 no rubs, murmurs or gallops Abdomen:  Obese, soft and nontender, BS+ Ext:   1+ edema, surgical deformity LLE    Data Reviewed:  Labs from Dr. Lisbeth Ply TSH, CMET - scanned     Assessment & Plan:  Ulcerative colitis, chronic Continue sulfasalazine - get all 6 in (3 bid ok) RTC 1 year CBC today  GERD with stricture Continue PPI    JO:INOMVEH,MCNOB L, MD

## 2013-10-11 NOTE — Assessment & Plan Note (Addendum)
Continue sulfasalazine - get all 6 in (3 bid ok) RTC 1 year CBC today

## 2013-10-12 ENCOUNTER — Encounter: Payer: Self-pay | Admitting: Internal Medicine

## 2013-10-12 ENCOUNTER — Other Ambulatory Visit: Payer: Self-pay

## 2013-10-12 DIAGNOSIS — K5190 Ulcerative colitis, unspecified, without complications: Secondary | ICD-10-CM

## 2013-10-12 NOTE — Progress Notes (Signed)
Mailed letter with 10/11/13 CBC test results that Dr. Carlean Purl wrote and put order in for CBC to be done in August 2015.

## 2013-11-16 ENCOUNTER — Encounter: Payer: Self-pay | Admitting: Podiatrist

## 2013-11-16 ENCOUNTER — Ambulatory Visit (INDEPENDENT_AMBULATORY_CARE_PROVIDER_SITE_OTHER): Payer: Medicare Other | Admitting: Podiatrist

## 2013-11-16 VITALS — BP 107/46 | HR 70 | Resp 18

## 2013-11-16 DIAGNOSIS — M722 Plantar fascial fibromatosis: Secondary | ICD-10-CM

## 2013-11-16 MED ORDER — TRIAMCINOLONE ACETONIDE 10 MG/ML IJ SUSP
10.0000 mg | Freq: Once | INTRAMUSCULAR | Status: AC
  Administered 2013-11-16: 10 mg

## 2013-11-16 NOTE — Patient Instructions (Signed)

## 2013-11-16 NOTE — Progress Notes (Signed)
   Subjective:    Patient ID: Jamie Montoya, female    DOB: September 25, 1945, 68 y.o.   MRN: [id number]  HPI My left heel is not doing any better and I was here last February and something is hurting it and there is a place on the bottom of my heel    Review of Systems  Respiratory:       COPD  Cardiovascular:       CHF  Hematological: Bruises/bleeds easily.  All other systems reviewed and are negative.      Objective:   Physical Exam Patient is awake, alert, and oriented x 3.  In no acute distress.  Vascular status is intact with palpable pedal pulses at 2/4 DP and PT bilateral and capillary refill time within normal limits. Swelling to bilateral legs is present with redness to anterior shins-- appears to be from the swelling itself and does appear cellulitic.   Neurological sensation is also intact bilaterally via Semmes Weinstein monofilament at 5/5 sites. Light touch, vibratory sensation, Achilles tendon reflex is intact. Porokeratotic lesions present x 2 left heel.  Pain with direct pressure elicited.  Pain along the plantar medial side of the left foot also palpated consistent with plantar fasciitis.     Assessment & Plan:  Plantar fasciitis left, porokeratotic lesion left heel  Plan: Under sterile technique an injection of Kenalog and Marcaine mixture was infiltrated into the plantar aspect of the left heel without complication. The porokeratotic lesions were excised as well with a #15 blade. If the injection is not beneficial within 2 weeks she is instructed to call otherwise she will be seen back for recheck as necessary.

## 2014-02-24 ENCOUNTER — Encounter: Payer: Self-pay | Admitting: Internal Medicine

## 2014-03-02 ENCOUNTER — Encounter: Payer: Self-pay | Admitting: Internal Medicine

## 2014-04-25 ENCOUNTER — Telehealth: Payer: Self-pay | Admitting: *Deleted

## 2014-04-25 NOTE — Telephone Encounter (Signed)
Dr Jamie Montoya,  This Pt. Is scheduled for a PV Wednesday 11-4 and reviewing her chart her last BMI was 58.5 in April of this year. I didn't know if you wanted to see her in the office or if she was okay for a direct at the hospital. She has history of TA polyps.  Please advise. Thanks   Lelan Pons PV

## 2014-04-26 NOTE — Telephone Encounter (Signed)
OK for direct I saw her in April If anything up at [facility] we can go from there

## 2014-04-27 ENCOUNTER — Ambulatory Visit (AMBULATORY_SURGERY_CENTER): Payer: Self-pay | Admitting: *Deleted

## 2014-04-27 ENCOUNTER — Other Ambulatory Visit: Payer: Self-pay

## 2014-04-27 VITALS — Ht 65.0 in | Wt 365.8 lb

## 2014-04-27 DIAGNOSIS — Z1211 Encounter for screening for malignant neoplasm of colon: Secondary | ICD-10-CM

## 2014-04-27 DIAGNOSIS — Z86010 Personal history of colonic polyps: Secondary | ICD-10-CM

## 2014-04-27 NOTE — Progress Notes (Signed)
No egg or soy allergy. ewm No issues with past sedation. ewm Pt has 02 at home, 2.5L as needed via [place] per pt. ewm No diet pills. ewm No blood thinners ewm Pt has BMI of 60.87 today in PV, weight of 364.8lb. Per TE from Dr Carlean Purl, ok for a direct Hospital colon. See TE. ewm Pt doesn't have e  Mail. ewm Pt given blank instructions to complete when called by office to give new dates and times . Pt worried about insurance covering her procedure at [facility]. ewm

## 2014-04-27 NOTE — Telephone Encounter (Signed)
Patient is scheduled for 06/21/14 10:00 at [facility].  I have reviewed all the instructions with the patient by phone.  Orders entered

## 2014-04-27 NOTE — Telephone Encounter (Signed)
Pt came in today for her PV. Her weight was 364.8lb, BMI 60.87 and pt states she uses 02 2.5L at home as needed for her breathing issues. Per Dr Celesta Aver note, okay to schedule pt as a direct colon at [facility]. I gave pt blank instructions and informed her she would be called with dates and times for procedure and how to complete her prep and arrive at hosp etc. Pt is concerned about her insurance not covering her at the hospital. Told her that she can call her insurance to check on coverage and that the office would do the same.   I saw this pt in PV today.  Pt is scheduled for procedure at [facility] on .  This is your reminder to enter orders for pt.  Thanks.  Any questions please call me in PV.  Thanks  Lelan Pons

## 2014-05-03 ENCOUNTER — Telehealth: Payer: Self-pay

## 2014-05-03 NOTE — Telephone Encounter (Signed)
Questions answered about doing the Miralax prep for her upcoming colonoscopy at [place] the end of December.

## 2014-05-11 ENCOUNTER — Encounter: Payer: Medicare Other | Admitting: Internal Medicine

## 2014-06-03 ENCOUNTER — Encounter (HOSPITAL_COMMUNITY): Payer: Self-pay | Admitting: *Deleted

## 2014-06-21 ENCOUNTER — Ambulatory Visit (HOSPITAL_COMMUNITY)
Admission: RE | Admit: 2014-06-21 | Discharge: 2014-06-21 | Disposition: A | Discharge status: Home / Self Care | Payer: Medicare Other | Source: Ambulatory Visit | Attending: Internal Medicine | Admitting: Internal Medicine

## 2014-06-21 ENCOUNTER — Encounter (HOSPITAL_COMMUNITY)
Admission: RE | Disposition: A | Discharge status: Home / Self Care | Payer: Self-pay | Source: Ambulatory Visit | Attending: Internal Medicine

## 2014-06-21 ENCOUNTER — Ambulatory Visit (HOSPITAL_COMMUNITY): Payer: Medicare Other | Admitting: *Deleted

## 2014-06-21 ENCOUNTER — Encounter (HOSPITAL_COMMUNITY): Payer: Self-pay

## 2014-06-21 DIAGNOSIS — E039 Hypothyroidism, unspecified: Secondary | ICD-10-CM | POA: Insufficient documentation

## 2014-06-21 DIAGNOSIS — I509 Heart failure, unspecified: Secondary | ICD-10-CM | POA: Diagnosis not present

## 2014-06-21 DIAGNOSIS — Z1211 Encounter for screening for malignant neoplasm of colon: Secondary | ICD-10-CM | POA: Diagnosis present

## 2014-06-21 DIAGNOSIS — Z9981 Dependence on supplemental oxygen: Secondary | ICD-10-CM | POA: Diagnosis not present

## 2014-06-21 DIAGNOSIS — Z87891 Personal history of nicotine dependence: Secondary | ICD-10-CM | POA: Insufficient documentation

## 2014-06-21 DIAGNOSIS — E785 Hyperlipidemia, unspecified: Secondary | ICD-10-CM | POA: Diagnosis not present

## 2014-06-21 DIAGNOSIS — K621 Rectal polyp: Secondary | ICD-10-CM | POA: Insufficient documentation

## 2014-06-21 DIAGNOSIS — K5190 Ulcerative colitis, unspecified, without complications: Secondary | ICD-10-CM | POA: Diagnosis not present

## 2014-06-21 DIAGNOSIS — K635 Polyp of colon: Secondary | ICD-10-CM | POA: Diagnosis not present

## 2014-06-21 DIAGNOSIS — K219 Gastro-esophageal reflux disease without esophagitis: Secondary | ICD-10-CM | POA: Diagnosis not present

## 2014-06-21 DIAGNOSIS — K5790 Diverticulosis of intestine, part unspecified, without perforation or abscess without bleeding: Secondary | ICD-10-CM | POA: Diagnosis not present

## 2014-06-21 DIAGNOSIS — D123 Benign neoplasm of transverse colon: Secondary | ICD-10-CM | POA: Insufficient documentation

## 2014-06-21 DIAGNOSIS — K51918 Ulcerative colitis, unspecified with other complication: Secondary | ICD-10-CM

## 2014-06-21 DIAGNOSIS — J449 Chronic obstructive pulmonary disease, unspecified: Secondary | ICD-10-CM | POA: Diagnosis not present

## 2014-06-21 DIAGNOSIS — D120 Benign neoplasm of cecum: Secondary | ICD-10-CM | POA: Insufficient documentation

## 2014-06-21 DIAGNOSIS — Z860100 Personal history of colon polyps, unspecified: Secondary | ICD-10-CM | POA: Insufficient documentation

## 2014-06-21 DIAGNOSIS — D128 Benign neoplasm of rectum: Secondary | ICD-10-CM | POA: Insufficient documentation

## 2014-06-21 HISTORY — PX: COLONOSCOPY WITH PROPOFOL: SHX5780

## 2014-06-21 SURGERY — COLONOSCOPY WITH PROPOFOL
Anesthesia: Monitor Anesthesia Care

## 2014-06-21 MED ORDER — PROPOFOL 10 MG/ML IV BOLUS
INTRAVENOUS | Status: AC
  Filled 2014-06-21: qty 20

## 2014-06-21 MED ORDER — LIDOCAINE HCL (CARDIAC) 20 MG/ML IV SOLN
INTRAVENOUS | Status: DC | PRN
  Administered 2014-06-21 (×2): 50 mg via INTRAVENOUS

## 2014-06-21 MED ORDER — GLYCOPYRROLATE 0.2 MG/ML IJ SOLN
INTRAMUSCULAR | Status: DC | PRN
  Administered 2014-06-21 (×2): 0.1 mg via INTRAVENOUS

## 2014-06-21 MED ORDER — PROPOFOL 10 MG/ML IV BOLUS
INTRAVENOUS | Status: DC | PRN
  Administered 2014-06-21: 10 mg via INTRAVENOUS
  Administered 2014-06-21 (×2): 20 mg via INTRAVENOUS
  Administered 2014-06-21: 30 mg via INTRAVENOUS
  Administered 2014-06-21: 20 mg via INTRAVENOUS

## 2014-06-21 MED ORDER — ESMOLOL HCL 10 MG/ML IV SOLN
INTRAVENOUS | Status: DC | PRN
  Administered 2014-06-21 (×2): 5 mg via INTRAVENOUS

## 2014-06-21 MED ORDER — KETAMINE HCL 10 MG/ML IJ SOLN
INTRAMUSCULAR | Status: AC
  Filled 2014-06-21: qty 1

## 2014-06-21 MED ORDER — ONDANSETRON HCL 4 MG/2ML IJ SOLN
INTRAMUSCULAR | Status: DC | PRN
Start: 2014-06-21 — End: 2014-06-21
  Administered 2014-06-21: 4 mg via INTRAVENOUS

## 2014-06-21 MED ORDER — FENTANYL CITRATE 0.05 MG/ML IJ SOLN: 25.0000 ug | INTRAMUSCULAR | Status: DC | PRN

## 2014-06-21 MED ORDER — SODIUM CHLORIDE 0.9 % IV SOLN: INTRAVENOUS | Status: DC

## 2014-06-21 MED ORDER — PROPOFOL INFUSION 10 MG/ML OPTIME
INTRAVENOUS | Status: DC | PRN
  Administered 2014-06-21: 200 ug/kg/min via INTRAVENOUS

## 2014-06-21 MED ORDER — KETAMINE HCL 10 MG/ML IJ SOLN
INTRAMUSCULAR | Status: DC | PRN
  Administered 2014-06-21 (×2): 5 mg via INTRAVENOUS
  Administered 2014-06-21 (×2): 10 mg via INTRAVENOUS
  Administered 2014-06-21: 20 mg via INTRAVENOUS
  Administered 2014-06-21: 10 mg via INTRAVENOUS

## 2014-06-21 MED ORDER — METOCLOPRAMIDE HCL 5 MG/ML IJ SOLN
INTRAMUSCULAR | Status: DC | PRN
  Administered 2014-06-21: 10 mg via INTRAVENOUS

## 2014-06-21 MED ORDER — LACTATED RINGERS IV SOLN
INTRAVENOUS | Status: DC
  Administered 2014-06-21: 10:00:00 via INTRAVENOUS

## 2014-06-21 SURGICAL SUPPLY — 21 items

## 2014-06-21 NOTE — Discharge Instructions (Signed)
° °  The colitis is active in the left-side of the colon.  I found and removed 3 polyps.  I will let you know results and plans. You need to be sure to take all 3 doses of sulfasalazine! Every day!  I appreciate the opportunity to care for you. Gatha Mayer, MD, FACG  YOU HAD AN ENDOSCOPIC PROCEDURE TODAY: Refer to the procedure report and other information in the discharge instructions given to you for any specific questions about what was found during the examination. If this information does not answer your questions, please call Dr. Celesta Aver office at [phone] to clarify.   YOU SHOULD EXPECT: Some feelings of bloating in the abdomen. Passage of more gas than usual. Walking can help get rid of the air that was put into your GI tract during the procedure and reduce the bloating. If you had a lower endoscopy (such as a colonoscopy or flexible sigmoidoscopy) you may notice spotting of blood in your stool or on the toilet paper. Some abdominal soreness may be present for a day or two, also.  DIET: Your first meal following the procedure should be a light meal and then it is ok to progress to your normal diet. A half-sandwich or bowl of soup is an example of a good first meal. Heavy or fried foods are harder to digest and may make you feel nauseous or bloated. Drink plenty of fluids but you should avoid alcoholic beverages for 24 hours.   ACTIVITY: Your care partner should take you home directly after the procedure. You should plan to take it easy, moving slowly for the rest of the day. You can resume normal activity the day after the procedure however YOU SHOULD NOT DRIVE, use power tools, machinery or perform tasks that involve climbing or major physical exertion for 24 hours (because of the sedation medicines used during the test).   SYMPTOMS TO REPORT IMMEDIATELY: A gastroenterologist can be reached at any hour. Please call [phone]  for any of the following symptoms:  Following  lower endoscopy (colonoscopy, flexible sigmoidoscopy) Excessive amounts of blood in the stool  Significant tenderness, worsening of abdominal pains  Swelling of the abdomen that is new, acute  Fever of 100 or higher  Following upper endoscopy (EGD, EUS, ERCP, esophageal dilation) Vomiting of blood or coffee ground material  New, significant abdominal pain  New, significant chest pain or pain under the shoulder blades  Painful or persistently difficult swallowing  New shortness of breath  Black, tarry-looking or red, bloody stools  FOLLOW UP:  If any biopsies were taken you will be contacted by phone or by letter within the next 1-3 weeks. Call [phone]  if you have not heard about the biopsies in 3 weeks.  Please also call with any specific questions about appointments or follow up tests.

## 2014-06-21 NOTE — Transfer of Care (Signed)
Immediate Anesthesia Transfer of Care Note  Patient: Jamie Montoya  Procedure(s) Performed: Procedure(s): COLONOSCOPY WITH PROPOFOL (N/A)  Patient Location: PACU  Anesthesia Type:MAC  Level of Consciousness: Patient easily awoken, sedated, comfortable, cooperative, following commands, responds to stimulation.   Airway & Oxygen Therapy: Patient spontaneously breathing, ventilating well, oxygen via simple oxygen mask.  Post-op Assessment: Report given to PACU RN, vital signs reviewed and stable, moving all extremities.   Post vital signs: Reviewed and stable.  Complications: No apparent anesthesia complications

## 2014-06-21 NOTE — H&P (Signed)
[facility] History and Physical   Primary Care Physician:  Leonides Sake, MD   Reason for Procedure:   Surveillance of ulcerative colitis and colon polyp surveillance  Plan:    Colonoscopy - The risks and benefits as well as alternatives of endoscopic procedure(s) have been discussed and reviewed. All questions answered. The patient agrees to proceed.      HPI: Jamie Montoya is a 68 y.o. female with ulcerative colitis and hx colonic adenoma - she is due for routine repeat colonoscopy and surveillance biopsies.   Past Medical History  Diagnosis Date  . Stricture and stenosis of esophagus 09/10/2010  . Ulcerative colitis, universal 09/10/2010    endoscopic changes in rectum and sigmoid, microscopic elsewhere  . Obesity   . COPD (chronic obstructive pulmonary disease)   . GERD (gastroesophageal reflux disease) 09/10/10  . Hypothyroidism   . Hiatal hernia 09/10/10    1-2 cm  . CHF (congestive heart failure) 2014  . Cataract   . Hyperlipidemia     borderline  . Oxygen deficiency     has 02 at home to use as needed. no use in the last several months 2.5L as needed   . Edema extremities     bilateral lower extremity edema  . Cataract     bilateral    Past Surgical History  Procedure Laterality Date  . Carpal tunnel release      bilateral  . Cholecystectomy    . Colonoscopy  09/10/2010    ulcerative colitis, diminutive adenoma, diverticulosis  . Esophagogastroduodenoscopy  09/10/2010    esophageal stricture dilation, GERD, 1-2 cm hiatal hernia  . Polypectomy    . Upper gastrointestinal endoscopy    . Tonsillectomy      child    Prior to Admission medications   Medication Sig Start Date End Date Taking? Authorizing Provider  Biotin 5000 MCG TABS Take 1 tablet by mouth at bedtime.   Yes Historical Provider, MD  carvedilol (COREG) 6.25 MG tablet Take 6.25 mg by mouth 2 (two) times daily with a meal.   Yes Historical Provider, MD  clotrimazole-betamethasone  (LOTRISONE) cream Apply 1 application topically at bedtime. Applies to legs. 11/09/13  Yes Historical Provider, MD  furosemide (LASIX) 40 MG tablet Take 80 mg by mouth 2 (two) times daily.    Yes Historical Provider, MD  levothyroxine (SYNTHROID, LEVOTHROID) 125 MCG tablet Take 250 mcg by mouth daily before breakfast.   Yes Historical Provider, MD  Multiple Vitamins-Minerals (QC WOMENS DAILY MULTIVITAMIN) TABS Take 1 tablet by mouth at bedtime.    Yes Historical Provider, MD  omeprazole (PRILOSEC) 40 MG capsule Take 1 capsule (40 mg total) by mouth daily. Patient taking differently: Take 40 mg by mouth every morning.  02/11/13  Yes Gatha Mayer, MD  potassium chloride SA (K-DUR,KLOR-CON) 20 MEQ tablet Take 20 mEq by mouth at bedtime.    Yes Historical Provider, MD  pravastatin (PRAVACHOL) 20 MG tablet Take 20 mg by mouth at bedtime.    Yes Historical Provider, MD  sulfaSALAzine (AZULFIDINE) 500 MG tablet Take 2 tablets (1,000 mg total) by mouth 3 (three) times daily. 10/11/13  Yes Gatha Mayer, MD    Current Facility-Administered Medications  Medication Dose Route Frequency Provider Last Rate Last Dose  . 0.9 %  sodium chloride infusion   Intravenous Continuous Gatha Mayer, MD        Allergies as of 04/27/2014  . (No Known Allergies)    Family History  Problem Relation  Age of Onset  . Heart disease Father   . Esophageal cancer Mother   . Peripheral vascular disease Father   . Colon cancer Neg Hx     History   Social History  . Marital Status: Divorced    Spouse Name: N/A    Number of Children: 2  . Years of Education: N/A   Occupational History  . retired [employer]    Social History Main Topics  . Smoking status: Former Smoker    Types: Cigarettes    Quit date: 06/04/2003  . Smokeless tobacco: Never Used  . Alcohol Use: No     Comment: occasional ,very rare  . Drug Use: No  . Sexual Activity: Not on file   Other Topics Concern  . Not on file   Social History  Narrative    Review of Systems: All other review of systems as mentioned in the HPI.  Physical Exam: Vital signs in last 24 hours:     General:   Alert,  Well-developed, well-nourished, pleasant and cooperative in NAD - obese Lungs:  Clear throughout to auscultation.   Heart:  Regular rate and rhythm; no murmurs, clicks, rubs,  or gallops. Abdomen:  Soft, nontender and nondistended. Normal bowel sounds.   Neuro/Psych:  Alert and cooperative. Normal mood and affect. A and O x 3   @Jamie Vanzile  Simonne Maffucci, MD, [facility] [phone] (pager) 06/21/2014 9:49 AM@

## 2014-06-21 NOTE — Op Note (Signed)
[facility] [place], [address]   COLONOSCOPY PROCEDURE REPORT  PATIENT: Jamie Montoya, Jamie Montoya  MR#: [id number] BIRTHDATE: 26-May-1946 , 35  yrs. old GENDER: female ENDOSCOPIST: Gatha Mayer, MD, [facility] PROCEDURE DATE:  06/21/2014 PROCEDURE:   Colonoscopy with biopsy and Colonoscopy with snare polypectomy  ASA CLASS:   Class III INDICATIONS:hx adenomatous polyp(s) and ulecrative colitis. MEDICATIONS: Monitored anesthesia care and Per Anesthesia  DESCRIPTION OF PROCEDURE:   After the risks benefits and alternatives of the procedure were thoroughly explained, informed consent was obtained.  The digital rectal exam revealed no abnormalities of the rectum.   The Pentax Adult Colonscope [id number] endoscope was introduced through the anus and advanced to the cecum, which was identified by both the appendix and ileocecal valve. No adverse events experienced.   The quality of the prep was good, using MiraLax  The instrument was then slowly withdrawn as the colon was fully examined.      COLON FINDINGS: 1) 3 polyps removed by cold snare including surrounding tissue using cold snare technique.  cecum (20m) transverse (6 mm) and rectum (8636m.  Sent to pathology. 2) Active colitis, 15-40 cm with diffuse mucosal ulceration, erythema and loss of vascular pattern - moderate activity - multiple biopises taken 3) Nodular/polypoid patches of mucosa 50-60 cm - biopsied 4) Otherwise normal mucosa - biopised throughout in surveillance/dysplasia fashion 5) Pan-diverticulosis.  Retroflexed views revealed no abnormalities. The time to cecum=4 minutes 0 seconds.  Withdrawal time=28 minutes 0 seconds.  The scope was withdrawn and the procedure completed. COMPLICATIONS: There were no immediate complications.   ENDOSCOPIC IMPRESSION: 1) 3 polyps removed by cold snare including surrounding tissue using cold snare technique.  cecum (36m50mtransverse (6 mm) and rectum (8mm43m Sent  to pathology. 2) Active colitis, 15-40 cm with diffuse mucosal ulceration, erythema and loss of vascular pattern - moderate activity - multiple biopises taken 3) Nodular/polypoid patches of mucosa 50-60 cm - biopsied 4) Otherwise normal mucosa - biopised throughout in surveillance/dysplasia fashion 5) Pan-diverticulosis  RECOMMENDATIONS: 1.  Await biopsy results - will call 2.  Make sure she is taking full dose of sulfasalazine - may need to increase Co-morbidities may preclude future routine colonoscopy  eSigned:  CarlGatha Mayer, FACGSan Mateo Medical Center29/2015 11:19 AM   cc: MaurDaiva Eves and The Patient   PATIENT NAME:  Jamie Montoya, Jamie Montoya: [id number]

## 2014-06-21 NOTE — Anesthesia Preprocedure Evaluation (Addendum)
Anesthesia Evaluation  Patient identified by MRN, date of birth, ID band Patient awake    Reviewed: Allergy & Precautions, H&P , NPO status , Patient's Chart, lab work & pertinent test results  Airway Mallampati: III  TM Distance: >3 FB Neck ROM: full    Dental no notable dental hx. (+) Edentulous Upper, Edentulous Lower, Dental Advisory Given   Pulmonary COPD oxygen dependent, former smoker,  breath sounds clear to auscultation  Pulmonary exam normal       Cardiovascular Exercise Tolerance: Poor +CHF Rhythm:regular Rate:Normal     Neuro/Psych negative neurological ROS  negative psych ROS   GI/Hepatic negative GI ROS, Neg liver ROS, hiatal hernia, PUD, GERD-  Controlled and Medicated,Esophageal stricture   Endo/Other  Hypothyroidism Morbid obesity  Renal/GU negative Renal ROS  negative genitourinary   Musculoskeletal   Abdominal (+) + obese,   Peds  Hematology negative hematology ROS (+)   Anesthesia Other Findings   Reproductive/Obstetrics negative OB ROS                           Anesthesia Physical Anesthesia Plan  ASA: IV  Anesthesia Plan: MAC   Post-op Pain Management:    Induction:   Airway Management Planned:   Additional Equipment:   Intra-op Plan:   Post-operative Plan:   Informed Consent: I have reviewed the patients History and Physical, chart, labs and discussed the procedure including the risks, benefits and alternatives for the proposed anesthesia with the patient or authorized representative who has indicated his/her understanding and acceptance.   Dental Advisory Given  Plan Discussed with: CRNA and Surgeon  Anesthesia Plan Comments:         Anesthesia Quick Evaluation

## 2014-06-21 NOTE — Anesthesia Postprocedure Evaluation (Signed)
  Anesthesia Post-op Note  Patient: Jamie Montoya  Procedure(s) Performed: Procedure(s) (LRB): COLONOSCOPY WITH PROPOFOL (N/A)  Patient Location: PACU  Anesthesia Type: MAC  Level of Consciousness: awake and alert   Airway and Oxygen Therapy: Patient Spontanous Breathing  Post-op Pain: mild  Post-op Assessment: Post-op Vital signs reviewed, Patient's Cardiovascular Status Stable, Respiratory Function Stable, Patent Airway and No signs of Nausea or vomiting  Last Vitals:  Filed Vitals:   06/21/14 1134  BP:   Pulse: 66  Temp:   Resp: 10    Post-op Vital Signs: stable   Complications: No apparent anesthesia complications

## 2014-06-22 ENCOUNTER — Encounter (HOSPITAL_COMMUNITY): Payer: Self-pay | Admitting: Internal Medicine

## 2014-06-28 ENCOUNTER — Other Ambulatory Visit: Payer: Self-pay

## 2014-06-28 MED ORDER — SULFASALAZINE 500 MG PO TABS
1000.0000 mg | ORAL_TABLET | Freq: Four times a day (QID) | ORAL | Status: DC
Start: 2014-06-28 — End: 2015-10-03

## 2014-06-28 NOTE — Progress Notes (Signed)
Quick Note:  Let her know polyps were benign No cancer We may need to repeat colonoscopy in 2016 and I want her to schedule a next available appointment (Feb or March OK) to see me and discuss Place 1 year recall colon ______

## 2014-06-28 NOTE — Progress Notes (Signed)
Quick Note:  I called her - she will call for an appointment She needs new Rx for sulfasalazine 562m tabs 2 qid # 3 months supply with 3 RF ______

## 2014-07-18 DIAGNOSIS — J449 Chronic obstructive pulmonary disease, unspecified: Secondary | ICD-10-CM | POA: Diagnosis not present

## 2014-08-18 DIAGNOSIS — J449 Chronic obstructive pulmonary disease, unspecified: Secondary | ICD-10-CM | POA: Diagnosis not present

## 2014-09-08 ENCOUNTER — Ambulatory Visit (INDEPENDENT_AMBULATORY_CARE_PROVIDER_SITE_OTHER): Payer: Medicare Other

## 2014-09-08 VITALS — BP 130/67 | HR 77 | Resp 18

## 2014-09-08 DIAGNOSIS — Q828 Other specified congenital malformations of skin: Secondary | ICD-10-CM | POA: Diagnosis not present

## 2014-09-08 DIAGNOSIS — B070 Plantar wart: Secondary | ICD-10-CM | POA: Diagnosis not present

## 2014-09-08 NOTE — Progress Notes (Signed)
   Subjective:    Patient ID: Jamie Montoya, female    DOB: May 23, 1946, 69 y.o.   MRN: [id number]  HPI I HAVE SOME SPOTS ON THE BOTTOM OF MY LEFT HEEL AND HAS BEEN GOING ON FOR ABOUT 6 MONTHS AND HURTS WITH PRESSURE AND SORE AND TENDER AND BURNS AND THROBS AND SOME COLDNESS AND ON THE VERGE OF BEING DIABETIC    Review of Systems  All other systems reviewed and are negative.      Objective:   Physical Exam 69-year-old the options this time for follow up has recurrence of keratotic lesion plantar inferior left heel to the keratotic lesions are identified consistent with either verruca or porokeratosis had previously been treated however this time has recurred over the last several months. Patient is otherwise relatively stable health no diabetes no neuropathy neurovascular status intact except for significant edema both lower extremities a she's on a fluid pill did recommend using compression stockings to help control the edema and venous stasis which is present on both lower extremities. The keratotic lesions are debrided at this time should note neurovascular status otherwise intact       Assessment & Plan:  Assessment verruca plantaris for support keratoses lesions are debrided pack to 03% salicylic acid under occlusion for 24 hours follow-up in the future on an as-needed basis if there is any recurrence or exacerbation maintain cream or lotion recommended compression stockings to help with the edema in the future.  Harriet Masson DPM

## 2014-09-08 NOTE — Patient Instructions (Signed)

## 2014-09-09 DIAGNOSIS — E039 Hypothyroidism, unspecified: Secondary | ICD-10-CM | POA: Diagnosis not present

## 2014-09-09 DIAGNOSIS — R21 Rash and other nonspecific skin eruption: Secondary | ICD-10-CM | POA: Diagnosis not present

## 2014-09-09 DIAGNOSIS — Z9181 History of falling: Secondary | ICD-10-CM | POA: Diagnosis not present

## 2014-09-09 DIAGNOSIS — E782 Mixed hyperlipidemia: Secondary | ICD-10-CM | POA: Diagnosis not present

## 2014-09-09 DIAGNOSIS — R7309 Other abnormal glucose: Secondary | ICD-10-CM | POA: Diagnosis not present

## 2014-09-09 DIAGNOSIS — Z1389 Encounter for screening for other disorder: Secondary | ICD-10-CM | POA: Diagnosis not present

## 2014-09-09 DIAGNOSIS — E876 Hypokalemia: Secondary | ICD-10-CM | POA: Diagnosis not present

## 2014-09-16 DIAGNOSIS — J449 Chronic obstructive pulmonary disease, unspecified: Secondary | ICD-10-CM | POA: Diagnosis not present

## 2014-09-28 DIAGNOSIS — L97921 Non-pressure chronic ulcer of unspecified part of left lower leg limited to breakdown of skin: Secondary | ICD-10-CM | POA: Diagnosis not present

## 2014-09-28 DIAGNOSIS — I8310 Varicose veins of unspecified lower extremity with inflammation: Secondary | ICD-10-CM | POA: Diagnosis not present

## 2014-10-04 DIAGNOSIS — I8310 Varicose veins of unspecified lower extremity with inflammation: Secondary | ICD-10-CM | POA: Diagnosis not present

## 2014-10-04 DIAGNOSIS — L97921 Non-pressure chronic ulcer of unspecified part of left lower leg limited to breakdown of skin: Secondary | ICD-10-CM | POA: Diagnosis not present

## 2014-10-11 DIAGNOSIS — I8310 Varicose veins of unspecified lower extremity with inflammation: Secondary | ICD-10-CM | POA: Diagnosis not present

## 2014-10-11 DIAGNOSIS — R600 Localized edema: Secondary | ICD-10-CM | POA: Diagnosis not present

## 2014-10-17 DIAGNOSIS — J449 Chronic obstructive pulmonary disease, unspecified: Secondary | ICD-10-CM | POA: Diagnosis not present

## 2014-10-26 DIAGNOSIS — I8310 Varicose veins of unspecified lower extremity with inflammation: Secondary | ICD-10-CM | POA: Diagnosis not present

## 2014-11-16 DIAGNOSIS — J449 Chronic obstructive pulmonary disease, unspecified: Secondary | ICD-10-CM | POA: Diagnosis not present

## 2014-12-17 DIAGNOSIS — J449 Chronic obstructive pulmonary disease, unspecified: Secondary | ICD-10-CM | POA: Diagnosis not present

## 2015-01-16 DIAGNOSIS — J449 Chronic obstructive pulmonary disease, unspecified: Secondary | ICD-10-CM | POA: Diagnosis not present

## 2015-02-16 DIAGNOSIS — J449 Chronic obstructive pulmonary disease, unspecified: Secondary | ICD-10-CM | POA: Diagnosis not present

## 2015-03-19 DIAGNOSIS — J449 Chronic obstructive pulmonary disease, unspecified: Secondary | ICD-10-CM | POA: Diagnosis not present

## 2015-04-12 DIAGNOSIS — Z1389 Encounter for screening for other disorder: Secondary | ICD-10-CM | POA: Diagnosis not present

## 2015-04-12 DIAGNOSIS — Z23 Encounter for immunization: Secondary | ICD-10-CM | POA: Diagnosis not present

## 2015-04-12 DIAGNOSIS — R7303 Prediabetes: Secondary | ICD-10-CM | POA: Diagnosis not present

## 2015-04-12 DIAGNOSIS — R600 Localized edema: Secondary | ICD-10-CM | POA: Diagnosis not present

## 2015-04-12 DIAGNOSIS — Z139 Encounter for screening, unspecified: Secondary | ICD-10-CM | POA: Diagnosis not present

## 2015-04-12 DIAGNOSIS — E039 Hypothyroidism, unspecified: Secondary | ICD-10-CM | POA: Diagnosis not present

## 2015-04-12 DIAGNOSIS — E782 Mixed hyperlipidemia: Secondary | ICD-10-CM | POA: Diagnosis not present

## 2015-04-12 DIAGNOSIS — Z9181 History of falling: Secondary | ICD-10-CM | POA: Diagnosis not present

## 2015-04-18 DIAGNOSIS — J449 Chronic obstructive pulmonary disease, unspecified: Secondary | ICD-10-CM | POA: Diagnosis not present

## 2015-05-09 DIAGNOSIS — I8312 Varicose veins of left lower extremity with inflammation: Secondary | ICD-10-CM | POA: Diagnosis not present

## 2015-05-09 DIAGNOSIS — Z6841 Body Mass Index (BMI) 40.0 and over, adult: Secondary | ICD-10-CM | POA: Diagnosis not present

## 2015-05-09 DIAGNOSIS — J449 Chronic obstructive pulmonary disease, unspecified: Secondary | ICD-10-CM | POA: Diagnosis not present

## 2015-05-09 DIAGNOSIS — G4733 Obstructive sleep apnea (adult) (pediatric): Secondary | ICD-10-CM | POA: Diagnosis not present

## 2015-05-09 DIAGNOSIS — R609 Edema, unspecified: Secondary | ICD-10-CM | POA: Diagnosis not present

## 2015-05-09 DIAGNOSIS — T148 Other injury of unspecified body region: Secondary | ICD-10-CM | POA: Diagnosis not present

## 2015-05-09 DIAGNOSIS — J9690 Respiratory failure, unspecified, unspecified whether with hypoxia or hypercapnia: Secondary | ICD-10-CM | POA: Diagnosis not present

## 2015-05-09 DIAGNOSIS — I509 Heart failure, unspecified: Secondary | ICD-10-CM | POA: Diagnosis not present

## 2015-05-09 DIAGNOSIS — J441 Chronic obstructive pulmonary disease with (acute) exacerbation: Secondary | ICD-10-CM | POA: Diagnosis not present

## 2015-05-09 DIAGNOSIS — G9341 Metabolic encephalopathy: Secondary | ICD-10-CM | POA: Diagnosis not present

## 2015-05-09 DIAGNOSIS — E7800 Pure hypercholesterolemia, unspecified: Secondary | ICD-10-CM | POA: Diagnosis not present

## 2015-05-09 DIAGNOSIS — Z79899 Other long term (current) drug therapy: Secondary | ICD-10-CM | POA: Diagnosis not present

## 2015-05-09 DIAGNOSIS — L03116 Cellulitis of left lower limb: Secondary | ICD-10-CM | POA: Diagnosis not present

## 2015-05-09 DIAGNOSIS — Z515 Encounter for palliative care: Secondary | ICD-10-CM | POA: Diagnosis not present

## 2015-05-09 DIAGNOSIS — I8311 Varicose veins of right lower extremity with inflammation: Secondary | ICD-10-CM | POA: Diagnosis not present

## 2015-05-09 DIAGNOSIS — I110 Hypertensive heart disease with heart failure: Secondary | ICD-10-CM | POA: Diagnosis not present

## 2015-05-09 DIAGNOSIS — J9621 Acute and chronic respiratory failure with hypoxia: Secondary | ICD-10-CM | POA: Diagnosis not present

## 2015-05-09 DIAGNOSIS — E662 Morbid (severe) obesity with alveolar hypoventilation: Secondary | ICD-10-CM | POA: Diagnosis not present

## 2015-05-09 DIAGNOSIS — J9620 Acute and chronic respiratory failure, unspecified whether with hypoxia or hypercapnia: Secondary | ICD-10-CM | POA: Diagnosis not present

## 2015-05-09 DIAGNOSIS — M25571 Pain in right ankle and joints of right foot: Secondary | ICD-10-CM | POA: Diagnosis not present

## 2015-05-09 DIAGNOSIS — J9601 Acute respiratory failure with hypoxia: Secondary | ICD-10-CM | POA: Diagnosis not present

## 2015-05-09 DIAGNOSIS — L03115 Cellulitis of right lower limb: Secondary | ICD-10-CM | POA: Diagnosis not present

## 2015-05-09 DIAGNOSIS — I351 Nonrheumatic aortic (valve) insufficiency: Secondary | ICD-10-CM | POA: Diagnosis not present

## 2015-05-09 DIAGNOSIS — Z66 Do not resuscitate: Secondary | ICD-10-CM | POA: Diagnosis not present

## 2015-05-09 DIAGNOSIS — J9622 Acute and chronic respiratory failure with hypercapnia: Secondary | ICD-10-CM | POA: Diagnosis not present

## 2015-05-09 DIAGNOSIS — K219 Gastro-esophageal reflux disease without esophagitis: Secondary | ICD-10-CM | POA: Diagnosis not present

## 2015-05-09 DIAGNOSIS — R0602 Shortness of breath: Secondary | ICD-10-CM | POA: Diagnosis not present

## 2015-05-09 DIAGNOSIS — Z7982 Long term (current) use of aspirin: Secondary | ICD-10-CM | POA: Diagnosis not present

## 2015-05-09 DIAGNOSIS — J9602 Acute respiratory failure with hypercapnia: Secondary | ICD-10-CM | POA: Diagnosis not present

## 2015-05-09 DIAGNOSIS — M7731 Calcaneal spur, right foot: Secondary | ICD-10-CM | POA: Diagnosis not present

## 2015-05-09 DIAGNOSIS — E039 Hypothyroidism, unspecified: Secondary | ICD-10-CM | POA: Diagnosis not present

## 2015-05-09 DIAGNOSIS — Z7901 Long term (current) use of anticoagulants: Secondary | ICD-10-CM | POA: Diagnosis not present

## 2015-05-09 DIAGNOSIS — Z87891 Personal history of nicotine dependence: Secondary | ICD-10-CM | POA: Diagnosis not present

## 2015-05-09 DIAGNOSIS — J9600 Acute respiratory failure, unspecified whether with hypoxia or hypercapnia: Secondary | ICD-10-CM | POA: Diagnosis not present

## 2015-05-16 DIAGNOSIS — Z7401 Bed confinement status: Secondary | ICD-10-CM | POA: Diagnosis not present

## 2015-05-16 DIAGNOSIS — Z9981 Dependence on supplemental oxygen: Secondary | ICD-10-CM | POA: Diagnosis not present

## 2015-05-25 DIAGNOSIS — R197 Diarrhea, unspecified: Secondary | ICD-10-CM | POA: Diagnosis not present

## 2015-06-08 DIAGNOSIS — N390 Urinary tract infection, site not specified: Secondary | ICD-10-CM | POA: Diagnosis not present

## 2015-06-08 DIAGNOSIS — Z8719 Personal history of other diseases of the digestive system: Secondary | ICD-10-CM | POA: Diagnosis not present

## 2015-07-06 DIAGNOSIS — E669 Obesity, unspecified: Secondary | ICD-10-CM | POA: Diagnosis not present

## 2015-07-06 DIAGNOSIS — R262 Difficulty in walking, not elsewhere classified: Secondary | ICD-10-CM | POA: Diagnosis not present

## 2015-07-06 DIAGNOSIS — K5100 Ulcerative (chronic) pancolitis without complications: Secondary | ICD-10-CM | POA: Diagnosis not present

## 2015-07-06 DIAGNOSIS — J440 Chronic obstructive pulmonary disease with acute lower respiratory infection: Secondary | ICD-10-CM | POA: Diagnosis not present

## 2015-07-06 DIAGNOSIS — J449 Chronic obstructive pulmonary disease, unspecified: Secondary | ICD-10-CM | POA: Diagnosis not present

## 2015-07-06 DIAGNOSIS — Z8679 Personal history of other diseases of the circulatory system: Secondary | ICD-10-CM | POA: Diagnosis not present

## 2015-07-06 DIAGNOSIS — I10 Essential (primary) hypertension: Secondary | ICD-10-CM | POA: Diagnosis not present

## 2015-07-06 DIAGNOSIS — A047 Enterocolitis due to Clostridium difficile: Secondary | ICD-10-CM | POA: Diagnosis not present

## 2015-07-06 DIAGNOSIS — E785 Hyperlipidemia, unspecified: Secondary | ICD-10-CM | POA: Diagnosis not present

## 2015-07-06 DIAGNOSIS — R600 Localized edema: Secondary | ICD-10-CM | POA: Diagnosis not present

## 2015-07-06 DIAGNOSIS — Z8639 Personal history of other endocrine, nutritional and metabolic disease: Secondary | ICD-10-CM | POA: Diagnosis not present

## 2015-07-06 DIAGNOSIS — J069 Acute upper respiratory infection, unspecified: Secondary | ICD-10-CM | POA: Diagnosis not present

## 2015-07-06 DIAGNOSIS — I5023 Acute on chronic systolic (congestive) heart failure: Secondary | ICD-10-CM | POA: Diagnosis not present

## 2015-07-06 DIAGNOSIS — M6281 Muscle weakness (generalized): Secondary | ICD-10-CM | POA: Diagnosis not present

## 2015-07-10 DIAGNOSIS — I10 Essential (primary) hypertension: Secondary | ICD-10-CM | POA: Diagnosis not present

## 2015-07-10 DIAGNOSIS — K5100 Ulcerative (chronic) pancolitis without complications: Secondary | ICD-10-CM | POA: Diagnosis not present

## 2015-07-10 DIAGNOSIS — J449 Chronic obstructive pulmonary disease, unspecified: Secondary | ICD-10-CM | POA: Diagnosis not present

## 2015-07-10 DIAGNOSIS — I5023 Acute on chronic systolic (congestive) heart failure: Secondary | ICD-10-CM | POA: Diagnosis not present

## 2015-07-11 DIAGNOSIS — A047 Enterocolitis due to Clostridium difficile: Secondary | ICD-10-CM | POA: Diagnosis not present

## 2015-07-17 DIAGNOSIS — J069 Acute upper respiratory infection, unspecified: Secondary | ICD-10-CM | POA: Diagnosis not present

## 2015-07-20 DIAGNOSIS — J440 Chronic obstructive pulmonary disease with acute lower respiratory infection: Secondary | ICD-10-CM | POA: Diagnosis not present

## 2015-07-20 DIAGNOSIS — I5023 Acute on chronic systolic (congestive) heart failure: Secondary | ICD-10-CM | POA: Diagnosis not present

## 2015-07-20 DIAGNOSIS — R600 Localized edema: Secondary | ICD-10-CM | POA: Diagnosis not present

## 2015-07-24 ENCOUNTER — Encounter: Payer: Self-pay | Admitting: Internal Medicine

## 2015-07-26 DIAGNOSIS — K5100 Ulcerative (chronic) pancolitis without complications: Secondary | ICD-10-CM | POA: Diagnosis not present

## 2015-07-26 DIAGNOSIS — J449 Chronic obstructive pulmonary disease, unspecified: Secondary | ICD-10-CM | POA: Diagnosis not present

## 2015-07-26 DIAGNOSIS — R600 Localized edema: Secondary | ICD-10-CM | POA: Diagnosis not present

## 2015-07-26 DIAGNOSIS — J069 Acute upper respiratory infection, unspecified: Secondary | ICD-10-CM | POA: Diagnosis not present

## 2015-07-28 DIAGNOSIS — E785 Hyperlipidemia, unspecified: Secondary | ICD-10-CM | POA: Diagnosis not present

## 2015-07-28 DIAGNOSIS — Z8679 Personal history of other diseases of the circulatory system: Secondary | ICD-10-CM | POA: Diagnosis not present

## 2015-07-28 DIAGNOSIS — Z9981 Dependence on supplemental oxygen: Secondary | ICD-10-CM | POA: Diagnosis not present

## 2015-07-28 DIAGNOSIS — M6281 Muscle weakness (generalized): Secondary | ICD-10-CM | POA: Diagnosis not present

## 2015-07-28 DIAGNOSIS — R262 Difficulty in walking, not elsewhere classified: Secondary | ICD-10-CM | POA: Diagnosis not present

## 2015-07-28 DIAGNOSIS — Z8639 Personal history of other endocrine, nutritional and metabolic disease: Secondary | ICD-10-CM | POA: Diagnosis not present

## 2015-07-28 DIAGNOSIS — I10 Essential (primary) hypertension: Secondary | ICD-10-CM | POA: Diagnosis not present

## 2015-07-28 DIAGNOSIS — I509 Heart failure, unspecified: Secondary | ICD-10-CM | POA: Diagnosis not present

## 2015-07-28 DIAGNOSIS — J449 Chronic obstructive pulmonary disease, unspecified: Secondary | ICD-10-CM | POA: Diagnosis not present

## 2015-07-29 DIAGNOSIS — Z9981 Dependence on supplemental oxygen: Secondary | ICD-10-CM | POA: Diagnosis not present

## 2015-07-29 DIAGNOSIS — J449 Chronic obstructive pulmonary disease, unspecified: Secondary | ICD-10-CM | POA: Diagnosis not present

## 2015-07-29 DIAGNOSIS — Z8679 Personal history of other diseases of the circulatory system: Secondary | ICD-10-CM | POA: Diagnosis not present

## 2015-07-29 DIAGNOSIS — Z8639 Personal history of other endocrine, nutritional and metabolic disease: Secondary | ICD-10-CM | POA: Diagnosis not present

## 2015-07-29 DIAGNOSIS — I509 Heart failure, unspecified: Secondary | ICD-10-CM | POA: Diagnosis not present

## 2015-07-29 DIAGNOSIS — R262 Difficulty in walking, not elsewhere classified: Secondary | ICD-10-CM | POA: Diagnosis not present

## 2015-07-29 DIAGNOSIS — E785 Hyperlipidemia, unspecified: Secondary | ICD-10-CM | POA: Diagnosis not present

## 2015-07-29 DIAGNOSIS — M6281 Muscle weakness (generalized): Secondary | ICD-10-CM | POA: Diagnosis not present

## 2015-07-29 DIAGNOSIS — I10 Essential (primary) hypertension: Secondary | ICD-10-CM | POA: Diagnosis not present

## 2015-08-01 DIAGNOSIS — Z8639 Personal history of other endocrine, nutritional and metabolic disease: Secondary | ICD-10-CM | POA: Diagnosis not present

## 2015-08-01 DIAGNOSIS — E785 Hyperlipidemia, unspecified: Secondary | ICD-10-CM | POA: Diagnosis not present

## 2015-08-01 DIAGNOSIS — I10 Essential (primary) hypertension: Secondary | ICD-10-CM | POA: Diagnosis not present

## 2015-08-01 DIAGNOSIS — Z9981 Dependence on supplemental oxygen: Secondary | ICD-10-CM | POA: Diagnosis not present

## 2015-08-01 DIAGNOSIS — J449 Chronic obstructive pulmonary disease, unspecified: Secondary | ICD-10-CM | POA: Diagnosis not present

## 2015-08-01 DIAGNOSIS — M6281 Muscle weakness (generalized): Secondary | ICD-10-CM | POA: Diagnosis not present

## 2015-08-01 DIAGNOSIS — I509 Heart failure, unspecified: Secondary | ICD-10-CM | POA: Diagnosis not present

## 2015-08-01 DIAGNOSIS — Z8679 Personal history of other diseases of the circulatory system: Secondary | ICD-10-CM | POA: Diagnosis not present

## 2015-08-01 DIAGNOSIS — R262 Difficulty in walking, not elsewhere classified: Secondary | ICD-10-CM | POA: Diagnosis not present

## 2015-08-03 DIAGNOSIS — Z8679 Personal history of other diseases of the circulatory system: Secondary | ICD-10-CM | POA: Diagnosis not present

## 2015-08-03 DIAGNOSIS — Z8639 Personal history of other endocrine, nutritional and metabolic disease: Secondary | ICD-10-CM | POA: Diagnosis not present

## 2015-08-03 DIAGNOSIS — R262 Difficulty in walking, not elsewhere classified: Secondary | ICD-10-CM | POA: Diagnosis not present

## 2015-08-03 DIAGNOSIS — Z9981 Dependence on supplemental oxygen: Secondary | ICD-10-CM | POA: Diagnosis not present

## 2015-08-03 DIAGNOSIS — J449 Chronic obstructive pulmonary disease, unspecified: Secondary | ICD-10-CM | POA: Diagnosis not present

## 2015-08-03 DIAGNOSIS — I509 Heart failure, unspecified: Secondary | ICD-10-CM | POA: Diagnosis not present

## 2015-08-03 DIAGNOSIS — E785 Hyperlipidemia, unspecified: Secondary | ICD-10-CM | POA: Diagnosis not present

## 2015-08-03 DIAGNOSIS — I10 Essential (primary) hypertension: Secondary | ICD-10-CM | POA: Diagnosis not present

## 2015-08-03 DIAGNOSIS — M6281 Muscle weakness (generalized): Secondary | ICD-10-CM | POA: Diagnosis not present

## 2015-08-04 DIAGNOSIS — Z8679 Personal history of other diseases of the circulatory system: Secondary | ICD-10-CM | POA: Diagnosis not present

## 2015-08-04 DIAGNOSIS — R262 Difficulty in walking, not elsewhere classified: Secondary | ICD-10-CM | POA: Diagnosis not present

## 2015-08-04 DIAGNOSIS — I10 Essential (primary) hypertension: Secondary | ICD-10-CM | POA: Diagnosis not present

## 2015-08-04 DIAGNOSIS — Z9981 Dependence on supplemental oxygen: Secondary | ICD-10-CM | POA: Diagnosis not present

## 2015-08-04 DIAGNOSIS — I509 Heart failure, unspecified: Secondary | ICD-10-CM | POA: Diagnosis not present

## 2015-08-04 DIAGNOSIS — E785 Hyperlipidemia, unspecified: Secondary | ICD-10-CM | POA: Diagnosis not present

## 2015-08-04 DIAGNOSIS — J449 Chronic obstructive pulmonary disease, unspecified: Secondary | ICD-10-CM | POA: Diagnosis not present

## 2015-08-04 DIAGNOSIS — Z8639 Personal history of other endocrine, nutritional and metabolic disease: Secondary | ICD-10-CM | POA: Diagnosis not present

## 2015-08-04 DIAGNOSIS — M6281 Muscle weakness (generalized): Secondary | ICD-10-CM | POA: Diagnosis not present

## 2015-08-07 DIAGNOSIS — Z9981 Dependence on supplemental oxygen: Secondary | ICD-10-CM | POA: Diagnosis not present

## 2015-08-07 DIAGNOSIS — I10 Essential (primary) hypertension: Secondary | ICD-10-CM | POA: Diagnosis not present

## 2015-08-07 DIAGNOSIS — R262 Difficulty in walking, not elsewhere classified: Secondary | ICD-10-CM | POA: Diagnosis not present

## 2015-08-07 DIAGNOSIS — I509 Heart failure, unspecified: Secondary | ICD-10-CM | POA: Diagnosis not present

## 2015-08-07 DIAGNOSIS — Z8679 Personal history of other diseases of the circulatory system: Secondary | ICD-10-CM | POA: Diagnosis not present

## 2015-08-07 DIAGNOSIS — J449 Chronic obstructive pulmonary disease, unspecified: Secondary | ICD-10-CM | POA: Diagnosis not present

## 2015-08-07 DIAGNOSIS — M6281 Muscle weakness (generalized): Secondary | ICD-10-CM | POA: Diagnosis not present

## 2015-08-07 DIAGNOSIS — E785 Hyperlipidemia, unspecified: Secondary | ICD-10-CM | POA: Diagnosis not present

## 2015-08-07 DIAGNOSIS — Z8639 Personal history of other endocrine, nutritional and metabolic disease: Secondary | ICD-10-CM | POA: Diagnosis not present

## 2015-08-09 DIAGNOSIS — Z8679 Personal history of other diseases of the circulatory system: Secondary | ICD-10-CM | POA: Diagnosis not present

## 2015-08-09 DIAGNOSIS — I10 Essential (primary) hypertension: Secondary | ICD-10-CM | POA: Diagnosis not present

## 2015-08-09 DIAGNOSIS — E785 Hyperlipidemia, unspecified: Secondary | ICD-10-CM | POA: Diagnosis not present

## 2015-08-09 DIAGNOSIS — J449 Chronic obstructive pulmonary disease, unspecified: Secondary | ICD-10-CM | POA: Diagnosis not present

## 2015-08-09 DIAGNOSIS — M6281 Muscle weakness (generalized): Secondary | ICD-10-CM | POA: Diagnosis not present

## 2015-08-09 DIAGNOSIS — R262 Difficulty in walking, not elsewhere classified: Secondary | ICD-10-CM | POA: Diagnosis not present

## 2015-08-09 DIAGNOSIS — Z9981 Dependence on supplemental oxygen: Secondary | ICD-10-CM | POA: Diagnosis not present

## 2015-08-09 DIAGNOSIS — I509 Heart failure, unspecified: Secondary | ICD-10-CM | POA: Diagnosis not present

## 2015-08-09 DIAGNOSIS — Z8639 Personal history of other endocrine, nutritional and metabolic disease: Secondary | ICD-10-CM | POA: Diagnosis not present

## 2015-08-10 ENCOUNTER — Telehealth: Payer: Self-pay | Admitting: Internal Medicine

## 2015-08-10 DIAGNOSIS — R262 Difficulty in walking, not elsewhere classified: Secondary | ICD-10-CM | POA: Diagnosis not present

## 2015-08-10 DIAGNOSIS — M6281 Muscle weakness (generalized): Secondary | ICD-10-CM | POA: Diagnosis not present

## 2015-08-10 DIAGNOSIS — E785 Hyperlipidemia, unspecified: Secondary | ICD-10-CM | POA: Diagnosis not present

## 2015-08-10 DIAGNOSIS — Z9981 Dependence on supplemental oxygen: Secondary | ICD-10-CM | POA: Diagnosis not present

## 2015-08-10 DIAGNOSIS — Z8639 Personal history of other endocrine, nutritional and metabolic disease: Secondary | ICD-10-CM | POA: Diagnosis not present

## 2015-08-10 DIAGNOSIS — I509 Heart failure, unspecified: Secondary | ICD-10-CM | POA: Diagnosis not present

## 2015-08-10 DIAGNOSIS — J449 Chronic obstructive pulmonary disease, unspecified: Secondary | ICD-10-CM | POA: Diagnosis not present

## 2015-08-10 DIAGNOSIS — I10 Essential (primary) hypertension: Secondary | ICD-10-CM | POA: Diagnosis not present

## 2015-08-10 DIAGNOSIS — Z8679 Personal history of other diseases of the circulatory system: Secondary | ICD-10-CM | POA: Diagnosis not present

## 2015-08-10 NOTE — Telephone Encounter (Signed)
Patent reports that he is not able to leave her house for appt on 07/27/15 with Cecille Rubin Hvozdovic, PA. She needs a ramp built and is not able to navigate steps at this time.  She is having diarrhea.  She has been in and out of the hospital and hospice.  She is encouraged to call back once she has her ramp built and we can check for a cancellation on Dr. Carlean Purl or have her see an APP.  She is not sure when she will have the ramp built "maybe in a couple of weeks"   She is encouraged to call me once it is complete and we can look for an appt.  For now she is scheduled for 10/03/15.  She is advised that she should take imodium until she can leave the house and we can arrange an appt for her once ramp is built

## 2015-08-14 ENCOUNTER — Ambulatory Visit: Payer: Self-pay | Admitting: Physician Assistant

## 2015-08-14 DIAGNOSIS — M6281 Muscle weakness (generalized): Secondary | ICD-10-CM | POA: Diagnosis not present

## 2015-08-14 DIAGNOSIS — Z8679 Personal history of other diseases of the circulatory system: Secondary | ICD-10-CM | POA: Diagnosis not present

## 2015-08-14 DIAGNOSIS — R262 Difficulty in walking, not elsewhere classified: Secondary | ICD-10-CM | POA: Diagnosis not present

## 2015-08-14 DIAGNOSIS — Z8639 Personal history of other endocrine, nutritional and metabolic disease: Secondary | ICD-10-CM | POA: Diagnosis not present

## 2015-08-14 DIAGNOSIS — I509 Heart failure, unspecified: Secondary | ICD-10-CM | POA: Diagnosis not present

## 2015-08-14 DIAGNOSIS — Z9981 Dependence on supplemental oxygen: Secondary | ICD-10-CM | POA: Diagnosis not present

## 2015-08-14 DIAGNOSIS — E785 Hyperlipidemia, unspecified: Secondary | ICD-10-CM | POA: Diagnosis not present

## 2015-08-14 DIAGNOSIS — J449 Chronic obstructive pulmonary disease, unspecified: Secondary | ICD-10-CM | POA: Diagnosis not present

## 2015-08-14 DIAGNOSIS — I10 Essential (primary) hypertension: Secondary | ICD-10-CM | POA: Diagnosis not present

## 2015-08-15 DIAGNOSIS — R262 Difficulty in walking, not elsewhere classified: Secondary | ICD-10-CM | POA: Diagnosis not present

## 2015-08-15 DIAGNOSIS — Z9981 Dependence on supplemental oxygen: Secondary | ICD-10-CM | POA: Diagnosis not present

## 2015-08-15 DIAGNOSIS — Z8639 Personal history of other endocrine, nutritional and metabolic disease: Secondary | ICD-10-CM | POA: Diagnosis not present

## 2015-08-15 DIAGNOSIS — J449 Chronic obstructive pulmonary disease, unspecified: Secondary | ICD-10-CM | POA: Diagnosis not present

## 2015-08-15 DIAGNOSIS — Z8679 Personal history of other diseases of the circulatory system: Secondary | ICD-10-CM | POA: Diagnosis not present

## 2015-08-15 DIAGNOSIS — E785 Hyperlipidemia, unspecified: Secondary | ICD-10-CM | POA: Diagnosis not present

## 2015-08-15 DIAGNOSIS — M6281 Muscle weakness (generalized): Secondary | ICD-10-CM | POA: Diagnosis not present

## 2015-08-15 DIAGNOSIS — I10 Essential (primary) hypertension: Secondary | ICD-10-CM | POA: Diagnosis not present

## 2015-08-15 DIAGNOSIS — I509 Heart failure, unspecified: Secondary | ICD-10-CM | POA: Diagnosis not present

## 2015-08-16 DIAGNOSIS — I10 Essential (primary) hypertension: Secondary | ICD-10-CM | POA: Diagnosis not present

## 2015-08-16 DIAGNOSIS — Z8639 Personal history of other endocrine, nutritional and metabolic disease: Secondary | ICD-10-CM | POA: Diagnosis not present

## 2015-08-16 DIAGNOSIS — J449 Chronic obstructive pulmonary disease, unspecified: Secondary | ICD-10-CM | POA: Diagnosis not present

## 2015-08-16 DIAGNOSIS — Z9981 Dependence on supplemental oxygen: Secondary | ICD-10-CM | POA: Diagnosis not present

## 2015-08-16 DIAGNOSIS — Z8679 Personal history of other diseases of the circulatory system: Secondary | ICD-10-CM | POA: Diagnosis not present

## 2015-08-16 DIAGNOSIS — E785 Hyperlipidemia, unspecified: Secondary | ICD-10-CM | POA: Diagnosis not present

## 2015-08-16 DIAGNOSIS — R262 Difficulty in walking, not elsewhere classified: Secondary | ICD-10-CM | POA: Diagnosis not present

## 2015-08-16 DIAGNOSIS — M6281 Muscle weakness (generalized): Secondary | ICD-10-CM | POA: Diagnosis not present

## 2015-08-16 DIAGNOSIS — I509 Heart failure, unspecified: Secondary | ICD-10-CM | POA: Diagnosis not present

## 2015-08-17 DIAGNOSIS — Z9981 Dependence on supplemental oxygen: Secondary | ICD-10-CM | POA: Diagnosis not present

## 2015-08-17 DIAGNOSIS — J449 Chronic obstructive pulmonary disease, unspecified: Secondary | ICD-10-CM | POA: Diagnosis not present

## 2015-08-17 DIAGNOSIS — E785 Hyperlipidemia, unspecified: Secondary | ICD-10-CM | POA: Diagnosis not present

## 2015-08-17 DIAGNOSIS — M6281 Muscle weakness (generalized): Secondary | ICD-10-CM | POA: Diagnosis not present

## 2015-08-17 DIAGNOSIS — Z8639 Personal history of other endocrine, nutritional and metabolic disease: Secondary | ICD-10-CM | POA: Diagnosis not present

## 2015-08-17 DIAGNOSIS — I509 Heart failure, unspecified: Secondary | ICD-10-CM | POA: Diagnosis not present

## 2015-08-17 DIAGNOSIS — R262 Difficulty in walking, not elsewhere classified: Secondary | ICD-10-CM | POA: Diagnosis not present

## 2015-08-17 DIAGNOSIS — Z8679 Personal history of other diseases of the circulatory system: Secondary | ICD-10-CM | POA: Diagnosis not present

## 2015-08-17 DIAGNOSIS — I10 Essential (primary) hypertension: Secondary | ICD-10-CM | POA: Diagnosis not present

## 2015-08-21 DIAGNOSIS — K529 Noninfective gastroenteritis and colitis, unspecified: Secondary | ICD-10-CM | POA: Diagnosis not present

## 2015-08-21 DIAGNOSIS — Z8679 Personal history of other diseases of the circulatory system: Secondary | ICD-10-CM | POA: Diagnosis not present

## 2015-08-21 DIAGNOSIS — R262 Difficulty in walking, not elsewhere classified: Secondary | ICD-10-CM | POA: Diagnosis not present

## 2015-08-21 DIAGNOSIS — J449 Chronic obstructive pulmonary disease, unspecified: Secondary | ICD-10-CM | POA: Diagnosis not present

## 2015-08-21 DIAGNOSIS — I5030 Unspecified diastolic (congestive) heart failure: Secondary | ICD-10-CM | POA: Diagnosis not present

## 2015-08-21 DIAGNOSIS — I509 Heart failure, unspecified: Secondary | ICD-10-CM | POA: Diagnosis not present

## 2015-08-21 DIAGNOSIS — E785 Hyperlipidemia, unspecified: Secondary | ICD-10-CM | POA: Diagnosis not present

## 2015-08-21 DIAGNOSIS — I10 Essential (primary) hypertension: Secondary | ICD-10-CM | POA: Diagnosis not present

## 2015-08-21 DIAGNOSIS — Z9981 Dependence on supplemental oxygen: Secondary | ICD-10-CM | POA: Diagnosis not present

## 2015-08-21 DIAGNOSIS — I87303 Chronic venous hypertension (idiopathic) without complications of bilateral lower extremity: Secondary | ICD-10-CM | POA: Diagnosis not present

## 2015-08-21 DIAGNOSIS — Z8639 Personal history of other endocrine, nutritional and metabolic disease: Secondary | ICD-10-CM | POA: Diagnosis not present

## 2015-08-21 DIAGNOSIS — Z79899 Other long term (current) drug therapy: Secondary | ICD-10-CM | POA: Diagnosis not present

## 2015-08-21 DIAGNOSIS — E039 Hypothyroidism, unspecified: Secondary | ICD-10-CM | POA: Diagnosis not present

## 2015-08-21 DIAGNOSIS — M6281 Muscle weakness (generalized): Secondary | ICD-10-CM | POA: Diagnosis not present

## 2015-08-22 DIAGNOSIS — E785 Hyperlipidemia, unspecified: Secondary | ICD-10-CM | POA: Diagnosis not present

## 2015-08-22 DIAGNOSIS — Z8639 Personal history of other endocrine, nutritional and metabolic disease: Secondary | ICD-10-CM | POA: Diagnosis not present

## 2015-08-22 DIAGNOSIS — M6281 Muscle weakness (generalized): Secondary | ICD-10-CM | POA: Diagnosis not present

## 2015-08-22 DIAGNOSIS — Z9981 Dependence on supplemental oxygen: Secondary | ICD-10-CM | POA: Diagnosis not present

## 2015-08-22 DIAGNOSIS — J449 Chronic obstructive pulmonary disease, unspecified: Secondary | ICD-10-CM | POA: Diagnosis not present

## 2015-08-22 DIAGNOSIS — Z8679 Personal history of other diseases of the circulatory system: Secondary | ICD-10-CM | POA: Diagnosis not present

## 2015-08-22 DIAGNOSIS — I509 Heart failure, unspecified: Secondary | ICD-10-CM | POA: Diagnosis not present

## 2015-08-22 DIAGNOSIS — R262 Difficulty in walking, not elsewhere classified: Secondary | ICD-10-CM | POA: Diagnosis not present

## 2015-08-22 DIAGNOSIS — I10 Essential (primary) hypertension: Secondary | ICD-10-CM | POA: Diagnosis not present

## 2015-08-23 DIAGNOSIS — I509 Heart failure, unspecified: Secondary | ICD-10-CM | POA: Diagnosis not present

## 2015-08-23 DIAGNOSIS — Z8639 Personal history of other endocrine, nutritional and metabolic disease: Secondary | ICD-10-CM | POA: Diagnosis not present

## 2015-08-23 DIAGNOSIS — R262 Difficulty in walking, not elsewhere classified: Secondary | ICD-10-CM | POA: Diagnosis not present

## 2015-08-23 DIAGNOSIS — E785 Hyperlipidemia, unspecified: Secondary | ICD-10-CM | POA: Diagnosis not present

## 2015-08-23 DIAGNOSIS — J449 Chronic obstructive pulmonary disease, unspecified: Secondary | ICD-10-CM | POA: Diagnosis not present

## 2015-08-23 DIAGNOSIS — I10 Essential (primary) hypertension: Secondary | ICD-10-CM | POA: Diagnosis not present

## 2015-08-23 DIAGNOSIS — Z8679 Personal history of other diseases of the circulatory system: Secondary | ICD-10-CM | POA: Diagnosis not present

## 2015-08-23 DIAGNOSIS — Z9981 Dependence on supplemental oxygen: Secondary | ICD-10-CM | POA: Diagnosis not present

## 2015-08-23 DIAGNOSIS — M6281 Muscle weakness (generalized): Secondary | ICD-10-CM | POA: Diagnosis not present

## 2015-08-24 DIAGNOSIS — G4733 Obstructive sleep apnea (adult) (pediatric): Secondary | ICD-10-CM | POA: Diagnosis not present

## 2015-08-24 DIAGNOSIS — E785 Hyperlipidemia, unspecified: Secondary | ICD-10-CM | POA: Diagnosis not present

## 2015-08-24 DIAGNOSIS — M6281 Muscle weakness (generalized): Secondary | ICD-10-CM | POA: Diagnosis not present

## 2015-08-24 DIAGNOSIS — E662 Morbid (severe) obesity with alveolar hypoventilation: Secondary | ICD-10-CM | POA: Diagnosis not present

## 2015-08-24 DIAGNOSIS — Z9981 Dependence on supplemental oxygen: Secondary | ICD-10-CM | POA: Diagnosis not present

## 2015-08-24 DIAGNOSIS — I509 Heart failure, unspecified: Secondary | ICD-10-CM | POA: Diagnosis not present

## 2015-08-24 DIAGNOSIS — Z8639 Personal history of other endocrine, nutritional and metabolic disease: Secondary | ICD-10-CM | POA: Diagnosis not present

## 2015-08-24 DIAGNOSIS — Z8679 Personal history of other diseases of the circulatory system: Secondary | ICD-10-CM | POA: Diagnosis not present

## 2015-08-24 DIAGNOSIS — I82499 Acute embolism and thrombosis of other specified deep vein of unspecified lower extremity: Secondary | ICD-10-CM | POA: Diagnosis not present

## 2015-08-24 DIAGNOSIS — J449 Chronic obstructive pulmonary disease, unspecified: Secondary | ICD-10-CM | POA: Diagnosis not present

## 2015-08-24 DIAGNOSIS — I5030 Unspecified diastolic (congestive) heart failure: Secondary | ICD-10-CM | POA: Diagnosis not present

## 2015-08-24 DIAGNOSIS — I10 Essential (primary) hypertension: Secondary | ICD-10-CM | POA: Diagnosis not present

## 2015-08-24 DIAGNOSIS — R262 Difficulty in walking, not elsewhere classified: Secondary | ICD-10-CM | POA: Diagnosis not present

## 2015-08-25 DIAGNOSIS — Z9981 Dependence on supplemental oxygen: Secondary | ICD-10-CM | POA: Diagnosis not present

## 2015-08-25 DIAGNOSIS — I10 Essential (primary) hypertension: Secondary | ICD-10-CM | POA: Diagnosis not present

## 2015-08-25 DIAGNOSIS — Z8679 Personal history of other diseases of the circulatory system: Secondary | ICD-10-CM | POA: Diagnosis not present

## 2015-08-25 DIAGNOSIS — I509 Heart failure, unspecified: Secondary | ICD-10-CM | POA: Diagnosis not present

## 2015-08-25 DIAGNOSIS — E785 Hyperlipidemia, unspecified: Secondary | ICD-10-CM | POA: Diagnosis not present

## 2015-08-25 DIAGNOSIS — Z8639 Personal history of other endocrine, nutritional and metabolic disease: Secondary | ICD-10-CM | POA: Diagnosis not present

## 2015-08-25 DIAGNOSIS — M6281 Muscle weakness (generalized): Secondary | ICD-10-CM | POA: Diagnosis not present

## 2015-08-25 DIAGNOSIS — J449 Chronic obstructive pulmonary disease, unspecified: Secondary | ICD-10-CM | POA: Diagnosis not present

## 2015-08-25 DIAGNOSIS — R262 Difficulty in walking, not elsewhere classified: Secondary | ICD-10-CM | POA: Diagnosis not present

## 2015-08-27 DIAGNOSIS — Z8639 Personal history of other endocrine, nutritional and metabolic disease: Secondary | ICD-10-CM | POA: Diagnosis not present

## 2015-08-27 DIAGNOSIS — M6281 Muscle weakness (generalized): Secondary | ICD-10-CM | POA: Diagnosis not present

## 2015-08-27 DIAGNOSIS — I10 Essential (primary) hypertension: Secondary | ICD-10-CM | POA: Diagnosis not present

## 2015-08-27 DIAGNOSIS — R262 Difficulty in walking, not elsewhere classified: Secondary | ICD-10-CM | POA: Diagnosis not present

## 2015-08-27 DIAGNOSIS — Z8679 Personal history of other diseases of the circulatory system: Secondary | ICD-10-CM | POA: Diagnosis not present

## 2015-08-27 DIAGNOSIS — J449 Chronic obstructive pulmonary disease, unspecified: Secondary | ICD-10-CM | POA: Diagnosis not present

## 2015-08-27 DIAGNOSIS — E785 Hyperlipidemia, unspecified: Secondary | ICD-10-CM | POA: Diagnosis not present

## 2015-08-27 DIAGNOSIS — Z9981 Dependence on supplemental oxygen: Secondary | ICD-10-CM | POA: Diagnosis not present

## 2015-08-27 DIAGNOSIS — I509 Heart failure, unspecified: Secondary | ICD-10-CM | POA: Diagnosis not present

## 2015-08-29 DIAGNOSIS — J449 Chronic obstructive pulmonary disease, unspecified: Secondary | ICD-10-CM | POA: Diagnosis not present

## 2015-08-29 DIAGNOSIS — M6281 Muscle weakness (generalized): Secondary | ICD-10-CM | POA: Diagnosis not present

## 2015-08-29 DIAGNOSIS — I10 Essential (primary) hypertension: Secondary | ICD-10-CM | POA: Diagnosis not present

## 2015-08-29 DIAGNOSIS — E785 Hyperlipidemia, unspecified: Secondary | ICD-10-CM | POA: Diagnosis not present

## 2015-08-29 DIAGNOSIS — Z9981 Dependence on supplemental oxygen: Secondary | ICD-10-CM | POA: Diagnosis not present

## 2015-08-29 DIAGNOSIS — Z8679 Personal history of other diseases of the circulatory system: Secondary | ICD-10-CM | POA: Diagnosis not present

## 2015-08-29 DIAGNOSIS — I509 Heart failure, unspecified: Secondary | ICD-10-CM | POA: Diagnosis not present

## 2015-08-29 DIAGNOSIS — Z8639 Personal history of other endocrine, nutritional and metabolic disease: Secondary | ICD-10-CM | POA: Diagnosis not present

## 2015-08-29 DIAGNOSIS — R262 Difficulty in walking, not elsewhere classified: Secondary | ICD-10-CM | POA: Diagnosis not present

## 2015-08-31 DIAGNOSIS — Z8679 Personal history of other diseases of the circulatory system: Secondary | ICD-10-CM | POA: Diagnosis not present

## 2015-08-31 DIAGNOSIS — R262 Difficulty in walking, not elsewhere classified: Secondary | ICD-10-CM | POA: Diagnosis not present

## 2015-08-31 DIAGNOSIS — I509 Heart failure, unspecified: Secondary | ICD-10-CM | POA: Diagnosis not present

## 2015-08-31 DIAGNOSIS — M6281 Muscle weakness (generalized): Secondary | ICD-10-CM | POA: Diagnosis not present

## 2015-08-31 DIAGNOSIS — Z8639 Personal history of other endocrine, nutritional and metabolic disease: Secondary | ICD-10-CM | POA: Diagnosis not present

## 2015-08-31 DIAGNOSIS — Z9981 Dependence on supplemental oxygen: Secondary | ICD-10-CM | POA: Diagnosis not present

## 2015-08-31 DIAGNOSIS — I10 Essential (primary) hypertension: Secondary | ICD-10-CM | POA: Diagnosis not present

## 2015-08-31 DIAGNOSIS — E785 Hyperlipidemia, unspecified: Secondary | ICD-10-CM | POA: Diagnosis not present

## 2015-08-31 DIAGNOSIS — J449 Chronic obstructive pulmonary disease, unspecified: Secondary | ICD-10-CM | POA: Diagnosis not present

## 2015-09-04 DIAGNOSIS — J449 Chronic obstructive pulmonary disease, unspecified: Secondary | ICD-10-CM | POA: Diagnosis not present

## 2015-09-04 DIAGNOSIS — Z8679 Personal history of other diseases of the circulatory system: Secondary | ICD-10-CM | POA: Diagnosis not present

## 2015-09-04 DIAGNOSIS — M6281 Muscle weakness (generalized): Secondary | ICD-10-CM | POA: Diagnosis not present

## 2015-09-04 DIAGNOSIS — R262 Difficulty in walking, not elsewhere classified: Secondary | ICD-10-CM | POA: Diagnosis not present

## 2015-09-04 DIAGNOSIS — I509 Heart failure, unspecified: Secondary | ICD-10-CM | POA: Diagnosis not present

## 2015-09-04 DIAGNOSIS — Z9981 Dependence on supplemental oxygen: Secondary | ICD-10-CM | POA: Diagnosis not present

## 2015-09-04 DIAGNOSIS — I10 Essential (primary) hypertension: Secondary | ICD-10-CM | POA: Diagnosis not present

## 2015-09-04 DIAGNOSIS — E785 Hyperlipidemia, unspecified: Secondary | ICD-10-CM | POA: Diagnosis not present

## 2015-09-04 DIAGNOSIS — Z8639 Personal history of other endocrine, nutritional and metabolic disease: Secondary | ICD-10-CM | POA: Diagnosis not present

## 2015-09-07 DIAGNOSIS — Z8679 Personal history of other diseases of the circulatory system: Secondary | ICD-10-CM | POA: Diagnosis not present

## 2015-09-07 DIAGNOSIS — M6281 Muscle weakness (generalized): Secondary | ICD-10-CM | POA: Diagnosis not present

## 2015-09-07 DIAGNOSIS — I509 Heart failure, unspecified: Secondary | ICD-10-CM | POA: Diagnosis not present

## 2015-09-07 DIAGNOSIS — Z9981 Dependence on supplemental oxygen: Secondary | ICD-10-CM | POA: Diagnosis not present

## 2015-09-07 DIAGNOSIS — I10 Essential (primary) hypertension: Secondary | ICD-10-CM | POA: Diagnosis not present

## 2015-09-07 DIAGNOSIS — R262 Difficulty in walking, not elsewhere classified: Secondary | ICD-10-CM | POA: Diagnosis not present

## 2015-09-07 DIAGNOSIS — E785 Hyperlipidemia, unspecified: Secondary | ICD-10-CM | POA: Diagnosis not present

## 2015-09-07 DIAGNOSIS — J449 Chronic obstructive pulmonary disease, unspecified: Secondary | ICD-10-CM | POA: Diagnosis not present

## 2015-09-07 DIAGNOSIS — Z8639 Personal history of other endocrine, nutritional and metabolic disease: Secondary | ICD-10-CM | POA: Diagnosis not present

## 2015-09-08 DIAGNOSIS — I509 Heart failure, unspecified: Secondary | ICD-10-CM | POA: Diagnosis not present

## 2015-09-08 DIAGNOSIS — Z8679 Personal history of other diseases of the circulatory system: Secondary | ICD-10-CM | POA: Diagnosis not present

## 2015-09-08 DIAGNOSIS — M6281 Muscle weakness (generalized): Secondary | ICD-10-CM | POA: Diagnosis not present

## 2015-09-08 DIAGNOSIS — Z9981 Dependence on supplemental oxygen: Secondary | ICD-10-CM | POA: Diagnosis not present

## 2015-09-08 DIAGNOSIS — I10 Essential (primary) hypertension: Secondary | ICD-10-CM | POA: Diagnosis not present

## 2015-09-08 DIAGNOSIS — R262 Difficulty in walking, not elsewhere classified: Secondary | ICD-10-CM | POA: Diagnosis not present

## 2015-09-08 DIAGNOSIS — E785 Hyperlipidemia, unspecified: Secondary | ICD-10-CM | POA: Diagnosis not present

## 2015-09-08 DIAGNOSIS — Z8639 Personal history of other endocrine, nutritional and metabolic disease: Secondary | ICD-10-CM | POA: Diagnosis not present

## 2015-09-08 DIAGNOSIS — J449 Chronic obstructive pulmonary disease, unspecified: Secondary | ICD-10-CM | POA: Diagnosis not present

## 2015-09-11 DIAGNOSIS — J449 Chronic obstructive pulmonary disease, unspecified: Secondary | ICD-10-CM | POA: Diagnosis not present

## 2015-09-11 DIAGNOSIS — Z9981 Dependence on supplemental oxygen: Secondary | ICD-10-CM | POA: Diagnosis not present

## 2015-09-11 DIAGNOSIS — Z8639 Personal history of other endocrine, nutritional and metabolic disease: Secondary | ICD-10-CM | POA: Diagnosis not present

## 2015-09-11 DIAGNOSIS — I10 Essential (primary) hypertension: Secondary | ICD-10-CM | POA: Diagnosis not present

## 2015-09-11 DIAGNOSIS — R262 Difficulty in walking, not elsewhere classified: Secondary | ICD-10-CM | POA: Diagnosis not present

## 2015-09-11 DIAGNOSIS — I509 Heart failure, unspecified: Secondary | ICD-10-CM | POA: Diagnosis not present

## 2015-09-11 DIAGNOSIS — E785 Hyperlipidemia, unspecified: Secondary | ICD-10-CM | POA: Diagnosis not present

## 2015-09-11 DIAGNOSIS — Z8679 Personal history of other diseases of the circulatory system: Secondary | ICD-10-CM | POA: Diagnosis not present

## 2015-09-11 DIAGNOSIS — M6281 Muscle weakness (generalized): Secondary | ICD-10-CM | POA: Diagnosis not present

## 2015-09-12 DIAGNOSIS — I509 Heart failure, unspecified: Secondary | ICD-10-CM | POA: Diagnosis not present

## 2015-09-12 DIAGNOSIS — M6281 Muscle weakness (generalized): Secondary | ICD-10-CM | POA: Diagnosis not present

## 2015-09-12 DIAGNOSIS — J449 Chronic obstructive pulmonary disease, unspecified: Secondary | ICD-10-CM | POA: Diagnosis not present

## 2015-09-12 DIAGNOSIS — E785 Hyperlipidemia, unspecified: Secondary | ICD-10-CM | POA: Diagnosis not present

## 2015-09-12 DIAGNOSIS — Z9981 Dependence on supplemental oxygen: Secondary | ICD-10-CM | POA: Diagnosis not present

## 2015-09-12 DIAGNOSIS — Z8679 Personal history of other diseases of the circulatory system: Secondary | ICD-10-CM | POA: Diagnosis not present

## 2015-09-12 DIAGNOSIS — I10 Essential (primary) hypertension: Secondary | ICD-10-CM | POA: Diagnosis not present

## 2015-09-12 DIAGNOSIS — Z8639 Personal history of other endocrine, nutritional and metabolic disease: Secondary | ICD-10-CM | POA: Diagnosis not present

## 2015-09-12 DIAGNOSIS — R262 Difficulty in walking, not elsewhere classified: Secondary | ICD-10-CM | POA: Diagnosis not present

## 2015-09-14 DIAGNOSIS — I10 Essential (primary) hypertension: Secondary | ICD-10-CM | POA: Diagnosis not present

## 2015-09-14 DIAGNOSIS — Z8679 Personal history of other diseases of the circulatory system: Secondary | ICD-10-CM | POA: Diagnosis not present

## 2015-09-14 DIAGNOSIS — R262 Difficulty in walking, not elsewhere classified: Secondary | ICD-10-CM | POA: Diagnosis not present

## 2015-09-14 DIAGNOSIS — J449 Chronic obstructive pulmonary disease, unspecified: Secondary | ICD-10-CM | POA: Diagnosis not present

## 2015-09-14 DIAGNOSIS — Z9981 Dependence on supplemental oxygen: Secondary | ICD-10-CM | POA: Diagnosis not present

## 2015-09-14 DIAGNOSIS — E785 Hyperlipidemia, unspecified: Secondary | ICD-10-CM | POA: Diagnosis not present

## 2015-09-14 DIAGNOSIS — M6281 Muscle weakness (generalized): Secondary | ICD-10-CM | POA: Diagnosis not present

## 2015-09-14 DIAGNOSIS — Z8639 Personal history of other endocrine, nutritional and metabolic disease: Secondary | ICD-10-CM | POA: Diagnosis not present

## 2015-09-14 DIAGNOSIS — I509 Heart failure, unspecified: Secondary | ICD-10-CM | POA: Diagnosis not present

## 2015-09-18 DIAGNOSIS — Z8679 Personal history of other diseases of the circulatory system: Secondary | ICD-10-CM | POA: Diagnosis not present

## 2015-09-18 DIAGNOSIS — E785 Hyperlipidemia, unspecified: Secondary | ICD-10-CM | POA: Diagnosis not present

## 2015-09-18 DIAGNOSIS — R262 Difficulty in walking, not elsewhere classified: Secondary | ICD-10-CM | POA: Diagnosis not present

## 2015-09-18 DIAGNOSIS — I10 Essential (primary) hypertension: Secondary | ICD-10-CM | POA: Diagnosis not present

## 2015-09-18 DIAGNOSIS — J449 Chronic obstructive pulmonary disease, unspecified: Secondary | ICD-10-CM | POA: Diagnosis not present

## 2015-09-18 DIAGNOSIS — I509 Heart failure, unspecified: Secondary | ICD-10-CM | POA: Diagnosis not present

## 2015-09-18 DIAGNOSIS — M6281 Muscle weakness (generalized): Secondary | ICD-10-CM | POA: Diagnosis not present

## 2015-09-18 DIAGNOSIS — Z9981 Dependence on supplemental oxygen: Secondary | ICD-10-CM | POA: Diagnosis not present

## 2015-09-18 DIAGNOSIS — Z8639 Personal history of other endocrine, nutritional and metabolic disease: Secondary | ICD-10-CM | POA: Diagnosis not present

## 2015-09-21 DIAGNOSIS — M6281 Muscle weakness (generalized): Secondary | ICD-10-CM | POA: Diagnosis not present

## 2015-09-21 DIAGNOSIS — Z8639 Personal history of other endocrine, nutritional and metabolic disease: Secondary | ICD-10-CM | POA: Diagnosis not present

## 2015-09-21 DIAGNOSIS — I509 Heart failure, unspecified: Secondary | ICD-10-CM | POA: Diagnosis not present

## 2015-09-21 DIAGNOSIS — Z9981 Dependence on supplemental oxygen: Secondary | ICD-10-CM | POA: Diagnosis not present

## 2015-09-21 DIAGNOSIS — I10 Essential (primary) hypertension: Secondary | ICD-10-CM | POA: Diagnosis not present

## 2015-09-21 DIAGNOSIS — J449 Chronic obstructive pulmonary disease, unspecified: Secondary | ICD-10-CM | POA: Diagnosis not present

## 2015-09-21 DIAGNOSIS — R262 Difficulty in walking, not elsewhere classified: Secondary | ICD-10-CM | POA: Diagnosis not present

## 2015-09-21 DIAGNOSIS — Z8679 Personal history of other diseases of the circulatory system: Secondary | ICD-10-CM | POA: Diagnosis not present

## 2015-09-21 DIAGNOSIS — E785 Hyperlipidemia, unspecified: Secondary | ICD-10-CM | POA: Diagnosis not present

## 2015-09-24 DIAGNOSIS — I5030 Unspecified diastolic (congestive) heart failure: Secondary | ICD-10-CM | POA: Diagnosis not present

## 2015-09-24 DIAGNOSIS — E662 Morbid (severe) obesity with alveolar hypoventilation: Secondary | ICD-10-CM | POA: Diagnosis not present

## 2015-09-25 ENCOUNTER — Telehealth: Payer: Self-pay

## 2015-09-25 ENCOUNTER — Other Ambulatory Visit: Payer: Self-pay

## 2015-09-25 NOTE — Telephone Encounter (Signed)
Spoke with patient and she has almost enough sulfasalazine until her appointment next week.  She said lets just wait and see if Dr Carlean Purl wants to refill that or rx something new for her.

## 2015-09-25 NOTE — Telephone Encounter (Signed)
Opened in error

## 2015-09-26 DIAGNOSIS — J449 Chronic obstructive pulmonary disease, unspecified: Secondary | ICD-10-CM | POA: Diagnosis not present

## 2015-09-26 DIAGNOSIS — E785 Hyperlipidemia, unspecified: Secondary | ICD-10-CM | POA: Diagnosis not present

## 2015-09-26 DIAGNOSIS — Z8679 Personal history of other diseases of the circulatory system: Secondary | ICD-10-CM | POA: Diagnosis not present

## 2015-09-26 DIAGNOSIS — M6281 Muscle weakness (generalized): Secondary | ICD-10-CM | POA: Diagnosis not present

## 2015-09-26 DIAGNOSIS — Z8639 Personal history of other endocrine, nutritional and metabolic disease: Secondary | ICD-10-CM | POA: Diagnosis not present

## 2015-09-26 DIAGNOSIS — Z9981 Dependence on supplemental oxygen: Secondary | ICD-10-CM | POA: Diagnosis not present

## 2015-09-26 DIAGNOSIS — I509 Heart failure, unspecified: Secondary | ICD-10-CM | POA: Diagnosis not present

## 2015-09-26 DIAGNOSIS — I10 Essential (primary) hypertension: Secondary | ICD-10-CM | POA: Diagnosis not present

## 2015-09-26 DIAGNOSIS — R262 Difficulty in walking, not elsewhere classified: Secondary | ICD-10-CM | POA: Diagnosis not present

## 2015-09-29 DIAGNOSIS — I509 Heart failure, unspecified: Secondary | ICD-10-CM | POA: Diagnosis not present

## 2015-09-29 DIAGNOSIS — Z8639 Personal history of other endocrine, nutritional and metabolic disease: Secondary | ICD-10-CM | POA: Diagnosis not present

## 2015-09-29 DIAGNOSIS — M6281 Muscle weakness (generalized): Secondary | ICD-10-CM | POA: Diagnosis not present

## 2015-09-29 DIAGNOSIS — Z8679 Personal history of other diseases of the circulatory system: Secondary | ICD-10-CM | POA: Diagnosis not present

## 2015-09-29 DIAGNOSIS — Z9981 Dependence on supplemental oxygen: Secondary | ICD-10-CM | POA: Diagnosis not present

## 2015-09-29 DIAGNOSIS — E785 Hyperlipidemia, unspecified: Secondary | ICD-10-CM | POA: Diagnosis not present

## 2015-09-29 DIAGNOSIS — J449 Chronic obstructive pulmonary disease, unspecified: Secondary | ICD-10-CM | POA: Diagnosis not present

## 2015-09-29 DIAGNOSIS — R262 Difficulty in walking, not elsewhere classified: Secondary | ICD-10-CM | POA: Diagnosis not present

## 2015-09-29 DIAGNOSIS — I10 Essential (primary) hypertension: Secondary | ICD-10-CM | POA: Diagnosis not present

## 2015-10-02 DIAGNOSIS — E785 Hyperlipidemia, unspecified: Secondary | ICD-10-CM | POA: Diagnosis not present

## 2015-10-02 DIAGNOSIS — M6281 Muscle weakness (generalized): Secondary | ICD-10-CM | POA: Diagnosis not present

## 2015-10-02 DIAGNOSIS — I509 Heart failure, unspecified: Secondary | ICD-10-CM | POA: Diagnosis not present

## 2015-10-02 DIAGNOSIS — J449 Chronic obstructive pulmonary disease, unspecified: Secondary | ICD-10-CM | POA: Diagnosis not present

## 2015-10-02 DIAGNOSIS — R262 Difficulty in walking, not elsewhere classified: Secondary | ICD-10-CM | POA: Diagnosis not present

## 2015-10-02 DIAGNOSIS — I10 Essential (primary) hypertension: Secondary | ICD-10-CM | POA: Diagnosis not present

## 2015-10-02 DIAGNOSIS — Z8639 Personal history of other endocrine, nutritional and metabolic disease: Secondary | ICD-10-CM | POA: Diagnosis not present

## 2015-10-02 DIAGNOSIS — Z9981 Dependence on supplemental oxygen: Secondary | ICD-10-CM | POA: Diagnosis not present

## 2015-10-02 DIAGNOSIS — Z8679 Personal history of other diseases of the circulatory system: Secondary | ICD-10-CM | POA: Diagnosis not present

## 2015-10-03 ENCOUNTER — Ambulatory Visit (INDEPENDENT_AMBULATORY_CARE_PROVIDER_SITE_OTHER): Payer: Medicare Other | Admitting: Internal Medicine

## 2015-10-03 ENCOUNTER — Encounter: Payer: Self-pay | Admitting: Internal Medicine

## 2015-10-03 ENCOUNTER — Other Ambulatory Visit (INDEPENDENT_AMBULATORY_CARE_PROVIDER_SITE_OTHER): Payer: Medicare Other

## 2015-10-03 VITALS — BP 106/66 | HR 76

## 2015-10-03 DIAGNOSIS — R197 Diarrhea, unspecified: Secondary | ICD-10-CM | POA: Diagnosis not present

## 2015-10-03 DIAGNOSIS — K51919 Ulcerative colitis, unspecified with unspecified complications: Secondary | ICD-10-CM

## 2015-10-03 DIAGNOSIS — J9611 Chronic respiratory failure with hypoxia: Secondary | ICD-10-CM | POA: Diagnosis not present

## 2015-10-03 LAB — CBC WITH DIFFERENTIAL/PLATELET
BASOS PCT: 0.6 % (ref 0.0–3.0)
Basophils Absolute: 0 10*3/uL (ref 0.0–0.1)
EOS ABS: 0.1 10*3/uL (ref 0.0–0.7)
Eosinophils Relative: 2.4 % (ref 0.0–5.0)
HCT: 38.9 % (ref 36.0–46.0)
Hemoglobin: 12.3 g/dL (ref 12.0–15.0)
LYMPHS ABS: 1.1 10*3/uL (ref 0.7–4.0)
Lymphocytes Relative: 23.3 % (ref 12.0–46.0)
MCHC: 31.5 g/dL (ref 30.0–36.0)
MCV: 97.8 fl (ref 78.0–100.0)
MONO ABS: 0.5 10*3/uL (ref 0.1–1.0)
Monocytes Relative: 11.8 % (ref 3.0–12.0)
NEUTROS ABS: 2.9 10*3/uL (ref 1.4–7.7)
NEUTROS PCT: 61.9 % (ref 43.0–77.0)
PLATELETS: 145 10*3/uL — AB (ref 150.0–400.0)
RBC: 3.97 Mil/uL (ref 3.87–5.11)
RDW: 15.3 % (ref 11.5–15.5)
WBC: 4.6 10*3/uL (ref 4.0–10.5)

## 2015-10-03 LAB — COMPREHENSIVE METABOLIC PANEL
ALT: 7 U/L (ref 0–35)
AST: 10 U/L (ref 0–37)
Albumin: 3.8 g/dL (ref 3.5–5.2)
Alkaline Phosphatase: 48 U/L (ref 39–117)
BUN: 16 mg/dL (ref 6–23)
CALCIUM: 10.2 mg/dL (ref 8.4–10.5)
CHLORIDE: 95 meq/L — AB (ref 96–112)
CO2: 42 meq/L — AB (ref 19–32)
CREATININE: 0.71 mg/dL (ref 0.40–1.20)
GFR: 86.15 mL/min (ref >60.00)
Glucose, Bld: 87 mg/dL (ref 70–99)
Potassium: 4.2 mEq/L (ref 3.5–5.1)
SODIUM: 143 meq/L (ref 135–145)
Total Bilirubin: 0.4 mg/dL (ref 0.2–1.2)
Total Protein: 7.3 g/dL (ref 6.0–8.3)

## 2015-10-03 MED ORDER — PREDNISONE 10 MG PO TABS
40.0000 mg | ORAL_TABLET | Freq: Every day | ORAL | Status: DC
Start: 2015-10-03 — End: 2015-11-08

## 2015-10-03 MED ORDER — SULFASALAZINE 500 MG PO TABS: 1000.0000 mg | ORAL_TABLET | Freq: Four times a day (QID) | ORAL | Status: DC

## 2015-10-03 NOTE — Progress Notes (Signed)
   Subjective:    Patient ID: Jamie Montoya, female    DOB: August 21, 1945, 70 y.o.   MRN: [id number] Cc: diarrhea HPI Elderly ww w/ ulcerative colitis not seen in over 1 year - last year was in [facility] w/ resp failure it seems and was snet to Hospice then a SNF after leaving hospice. She was told only had months to live. Remains on sulfasalazine but has terrible watery diarrhea, no bleeding. Is weak. Some abd cramps. Has fecal incontinence. Says multiple C diff tests neg  I do not have records this is per patient and daughter Medications, allergies, past medical history, past surgical history, family history and social history are reviewed and updated in the EMR.   Review of Systems As above    Objective:   Physical Exam @BP  106/66 mmHg  Pulse 76  Ht   Wt @  General:  NAD morbidly obese chron ill on O2 in wheelchair Eyes:   anicteric Lungs:  clear Heart:: S1S2 no rubs, murmurs or gallops Abdomen:  soft and nontender, BS+ Ext:   no edema, cyanosis or clubbing       Assessment & Plan:   Encounter Diagnoses  Name Primary?  . Ulcerative colitis, chronic, unspecified complication ([place]) Yes  . Diarrhea, unspecified type   . Chronic respiratory failure with hypoxia ([facility])      Start prednisone at 40 mg qd and f/u by phone 5-7 d re: ? Response - my best guess now is fl[facility] - she seems reliable re: C diff neg - if prednisone not helping and records unhelpful could need C diff PCR vs endoscopic evaluation though her co-morbidities make that difficult She will remain on cholestyramine for now ? increase  Check labs  [facility]    Current outpatient prescriptions:  .  Biotin 5000 MCG TABS, Take 1 tablet by mouth at bedtime., Disp: , Rfl:  .  carvedilol (COREG) 6.25 MG tablet, Take 6.25 mg by mouth 2 (two) times daily with a meal., Disp: , Rfl:  .  cholestyramine (QUESTRAN) 4 g packet, Take 4 g by mouth 2 (two) times daily., Disp: , Rfl:  .   ferrous sulfate 325 (65 FE) MG tablet, Take 325 mg by mouth daily with breakfast., Disp: , Rfl:  .  furosemide (LASIX) 40 MG tablet, Take 40 mg by mouth 2 (two) times daily. , Disp: , Rfl:  .  levothyroxine (SYNTHROID, LEVOTHROID) 125 MCG tablet, Take 350 mcg by mouth daily before breakfast. , Disp: , Rfl:  .  potassium chloride SA (K-DUR,KLOR-CON) 20 MEQ tablet, Take 20 mEq by mouth at bedtime. , Disp: , Rfl:  .  pravastatin (PRAVACHOL) 20 MG tablet, Take 20 mg by mouth at bedtime. , Disp: , Rfl:  .  sulfaSALAzine (AZULFIDINE) 500 MG tablet, Take 2 tablets (1,000 mg total) by mouth 4 (four) times daily., Disp: 720 tablet, Rfl: 1 .  predniSONE (DELTASONE) 10 MG tablet, Take 4 tablets (40 mg total) by mouth daily with breakfast., Disp: 100 tablet, Rfl: 1   Cc;HAMRICK,MAURA L, MD

## 2015-10-03 NOTE — Patient Instructions (Addendum)
  Your physician has requested that you go to the basement for the following lab work before leaving today: Cbc/diff, Cmet   We are going to get your records to review from [facility], [facility], and [place].   We have sent the following medications to your pharmacy for you to pick up at your convenience: Prednisone, take 4 tablets daily until we direct you different.    We have sent the following prescriptions to your mail in pharmacy: sulfasalazine  If you have not heard from your mail in pharmacy within 1 week or if you have not received your medication in the mail, please contact us at [phone] so we may find out why.    I appreciate the opportunity to care for you.

## 2015-10-03 NOTE — Assessment & Plan Note (Signed)
Prednisone 40 mg daily x 1 week then 30 mg daily

## 2015-10-04 ENCOUNTER — Encounter: Payer: Self-pay | Admitting: Internal Medicine

## 2015-10-04 NOTE — Progress Notes (Signed)
Quick Note:  Labs ok except slightly low platelts - not a problem and abnormal CO2 and CL which I would bet is due to her breathiing problems and not something we would add meds for, etc I need an update from her in 5-7 days re: how she is doing on 40 mg prednisone - if she does not call us we need to call her  ______

## 2015-10-05 ENCOUNTER — Other Ambulatory Visit (INDEPENDENT_AMBULATORY_CARE_PROVIDER_SITE_OTHER): Payer: Medicare Other

## 2015-10-05 ENCOUNTER — Telehealth: Payer: Self-pay

## 2015-10-05 DIAGNOSIS — E785 Hyperlipidemia, unspecified: Secondary | ICD-10-CM | POA: Diagnosis not present

## 2015-10-05 DIAGNOSIS — R197 Diarrhea, unspecified: Secondary | ICD-10-CM

## 2015-10-05 DIAGNOSIS — Z9981 Dependence on supplemental oxygen: Secondary | ICD-10-CM | POA: Diagnosis not present

## 2015-10-05 DIAGNOSIS — J449 Chronic obstructive pulmonary disease, unspecified: Secondary | ICD-10-CM | POA: Diagnosis not present

## 2015-10-05 DIAGNOSIS — R262 Difficulty in walking, not elsewhere classified: Secondary | ICD-10-CM | POA: Diagnosis not present

## 2015-10-05 DIAGNOSIS — Z8679 Personal history of other diseases of the circulatory system: Secondary | ICD-10-CM | POA: Diagnosis not present

## 2015-10-05 DIAGNOSIS — I10 Essential (primary) hypertension: Secondary | ICD-10-CM | POA: Diagnosis not present

## 2015-10-05 DIAGNOSIS — M6281 Muscle weakness (generalized): Secondary | ICD-10-CM | POA: Diagnosis not present

## 2015-10-05 DIAGNOSIS — I509 Heart failure, unspecified: Secondary | ICD-10-CM | POA: Diagnosis not present

## 2015-10-05 DIAGNOSIS — Z8639 Personal history of other endocrine, nutritional and metabolic disease: Secondary | ICD-10-CM | POA: Diagnosis not present

## 2015-10-05 LAB — TSH: TSH: 0.23 u[IU]/mL — AB (ref 0.35–4.50)

## 2015-10-05 NOTE — Telephone Encounter (Signed)
-----   Message from Gatha Mayer, MD sent at 10/04/2015  5:46 PM EDT ----- Regarding: add TSH? See if lab can add TSH to 4/11 labs using diarrhea dx  If not that is ok

## 2015-10-05 NOTE — Telephone Encounter (Signed)
Spoke to the lab to add on TSH and faxed them a add on request form.

## 2015-10-05 NOTE — Telephone Encounter (Signed)
Jamie Montoya called in to give [place] the correct dose on her synthroid, changed in her chart.  She started her prednisone and has had less diarrhea today.  We will talk on Monday for a status update.

## 2015-10-09 ENCOUNTER — Telehealth: Payer: Self-pay

## 2015-10-09 DIAGNOSIS — M6281 Muscle weakness (generalized): Secondary | ICD-10-CM | POA: Diagnosis not present

## 2015-10-09 DIAGNOSIS — R262 Difficulty in walking, not elsewhere classified: Secondary | ICD-10-CM | POA: Diagnosis not present

## 2015-10-09 DIAGNOSIS — E785 Hyperlipidemia, unspecified: Secondary | ICD-10-CM | POA: Diagnosis not present

## 2015-10-09 DIAGNOSIS — J449 Chronic obstructive pulmonary disease, unspecified: Secondary | ICD-10-CM | POA: Diagnosis not present

## 2015-10-09 DIAGNOSIS — Z9981 Dependence on supplemental oxygen: Secondary | ICD-10-CM | POA: Diagnosis not present

## 2015-10-09 DIAGNOSIS — I10 Essential (primary) hypertension: Secondary | ICD-10-CM | POA: Diagnosis not present

## 2015-10-09 DIAGNOSIS — I509 Heart failure, unspecified: Secondary | ICD-10-CM | POA: Diagnosis not present

## 2015-10-09 DIAGNOSIS — Z8679 Personal history of other diseases of the circulatory system: Secondary | ICD-10-CM | POA: Diagnosis not present

## 2015-10-09 DIAGNOSIS — Z8639 Personal history of other endocrine, nutritional and metabolic disease: Secondary | ICD-10-CM | POA: Diagnosis not present

## 2015-10-09 NOTE — Progress Notes (Signed)
Quick Note:  This suggests she is on too much thyroid medication - she needs to see PCP about this. Too much thyroid hormone can cause diarrhea. Please get an update re: diarrhea sxs ______

## 2015-10-09 NOTE — Telephone Encounter (Signed)
  Spoke to [facility] [employer] [facility] and she reports doing "ok". Still has the urgency and 3-4 episodes of diarrhea daily. No temp.  I told her to continue the prednisone 70m at 4 tabs daily until we tell her different.  She is going to see Dr HLisbeth Plyon 10/11/15.   Sheri notified her today of TSH results.

## 2015-10-11 DIAGNOSIS — R7303 Prediabetes: Secondary | ICD-10-CM | POA: Diagnosis not present

## 2015-10-11 DIAGNOSIS — E782 Mixed hyperlipidemia: Secondary | ICD-10-CM | POA: Diagnosis not present

## 2015-10-11 DIAGNOSIS — R600 Localized edema: Secondary | ICD-10-CM | POA: Diagnosis not present

## 2015-10-11 DIAGNOSIS — E039 Hypothyroidism, unspecified: Secondary | ICD-10-CM | POA: Diagnosis not present

## 2015-10-12 DIAGNOSIS — I10 Essential (primary) hypertension: Secondary | ICD-10-CM | POA: Diagnosis not present

## 2015-10-12 DIAGNOSIS — R262 Difficulty in walking, not elsewhere classified: Secondary | ICD-10-CM | POA: Diagnosis not present

## 2015-10-12 DIAGNOSIS — M6281 Muscle weakness (generalized): Secondary | ICD-10-CM | POA: Diagnosis not present

## 2015-10-12 DIAGNOSIS — Z8679 Personal history of other diseases of the circulatory system: Secondary | ICD-10-CM | POA: Diagnosis not present

## 2015-10-12 DIAGNOSIS — J449 Chronic obstructive pulmonary disease, unspecified: Secondary | ICD-10-CM | POA: Diagnosis not present

## 2015-10-12 DIAGNOSIS — Z9981 Dependence on supplemental oxygen: Secondary | ICD-10-CM | POA: Diagnosis not present

## 2015-10-12 DIAGNOSIS — E785 Hyperlipidemia, unspecified: Secondary | ICD-10-CM | POA: Diagnosis not present

## 2015-10-12 DIAGNOSIS — I509 Heart failure, unspecified: Secondary | ICD-10-CM | POA: Diagnosis not present

## 2015-10-12 DIAGNOSIS — Z8639 Personal history of other endocrine, nutritional and metabolic disease: Secondary | ICD-10-CM | POA: Diagnosis not present

## 2015-10-16 ENCOUNTER — Telehealth: Payer: Self-pay | Admitting: Internal Medicine

## 2015-10-16 DIAGNOSIS — I509 Heart failure, unspecified: Secondary | ICD-10-CM | POA: Diagnosis not present

## 2015-10-16 DIAGNOSIS — J449 Chronic obstructive pulmonary disease, unspecified: Secondary | ICD-10-CM | POA: Diagnosis not present

## 2015-10-16 DIAGNOSIS — E785 Hyperlipidemia, unspecified: Secondary | ICD-10-CM | POA: Diagnosis not present

## 2015-10-16 DIAGNOSIS — Z8679 Personal history of other diseases of the circulatory system: Secondary | ICD-10-CM | POA: Diagnosis not present

## 2015-10-16 DIAGNOSIS — I10 Essential (primary) hypertension: Secondary | ICD-10-CM | POA: Diagnosis not present

## 2015-10-16 DIAGNOSIS — Z9981 Dependence on supplemental oxygen: Secondary | ICD-10-CM | POA: Diagnosis not present

## 2015-10-16 DIAGNOSIS — R262 Difficulty in walking, not elsewhere classified: Secondary | ICD-10-CM | POA: Diagnosis not present

## 2015-10-16 DIAGNOSIS — M6281 Muscle weakness (generalized): Secondary | ICD-10-CM | POA: Diagnosis not present

## 2015-10-16 DIAGNOSIS — Z8639 Personal history of other endocrine, nutritional and metabolic disease: Secondary | ICD-10-CM | POA: Diagnosis not present

## 2015-10-16 NOTE — Telephone Encounter (Signed)
Rec'd from [facility] forward 65 pages to [employer]

## 2015-10-19 DIAGNOSIS — I509 Heart failure, unspecified: Secondary | ICD-10-CM | POA: Diagnosis not present

## 2015-10-19 DIAGNOSIS — E785 Hyperlipidemia, unspecified: Secondary | ICD-10-CM | POA: Diagnosis not present

## 2015-10-19 DIAGNOSIS — I10 Essential (primary) hypertension: Secondary | ICD-10-CM | POA: Diagnosis not present

## 2015-10-19 DIAGNOSIS — Z8679 Personal history of other diseases of the circulatory system: Secondary | ICD-10-CM | POA: Diagnosis not present

## 2015-10-19 DIAGNOSIS — J449 Chronic obstructive pulmonary disease, unspecified: Secondary | ICD-10-CM | POA: Diagnosis not present

## 2015-10-19 DIAGNOSIS — Z8639 Personal history of other endocrine, nutritional and metabolic disease: Secondary | ICD-10-CM | POA: Diagnosis not present

## 2015-10-19 DIAGNOSIS — Z9981 Dependence on supplemental oxygen: Secondary | ICD-10-CM | POA: Diagnosis not present

## 2015-10-19 DIAGNOSIS — R262 Difficulty in walking, not elsewhere classified: Secondary | ICD-10-CM | POA: Diagnosis not present

## 2015-10-19 DIAGNOSIS — M6281 Muscle weakness (generalized): Secondary | ICD-10-CM | POA: Diagnosis not present

## 2015-10-19 NOTE — Telephone Encounter (Signed)
Reduce prednisone to 30 mg qd Need an update in 1 week please Schedule an OV for late May please

## 2015-10-19 NOTE — Telephone Encounter (Signed)
Informed patient to go to 17m daily of her prednisone and I will call her in a week for an update.  We will book her follow up when the June opens up so she can get a 1:30pm appt.

## 2015-10-19 NOTE — Telephone Encounter (Signed)
Pt is on 350 mcg ( she takes 2 of the 126mg tablets) daily.  The Dr wants her to take this for several weeks and they will check her again.  She had been trying to equal the 3549m dose by mixing her other dose tablets and now she has an accurate dose since it is the 175 mcg tablets.

## 2015-10-19 NOTE — Telephone Encounter (Signed)
Please get sx update from her and current dose of prednisone

## 2015-10-19 NOTE — Telephone Encounter (Signed)
Did they lower her thyroid medication dose?

## 2015-10-19 NOTE — Telephone Encounter (Signed)
Today only 2 episodes of diarrhea but past several days has had 6-8 episodes of diarrhea, no blood, no fever, has had abdominal cramps.  She is still on 85m of Prednisone daily.

## 2015-10-23 DIAGNOSIS — J449 Chronic obstructive pulmonary disease, unspecified: Secondary | ICD-10-CM | POA: Diagnosis not present

## 2015-10-23 DIAGNOSIS — Z9981 Dependence on supplemental oxygen: Secondary | ICD-10-CM | POA: Diagnosis not present

## 2015-10-23 DIAGNOSIS — Z8639 Personal history of other endocrine, nutritional and metabolic disease: Secondary | ICD-10-CM | POA: Diagnosis not present

## 2015-10-23 DIAGNOSIS — M6281 Muscle weakness (generalized): Secondary | ICD-10-CM | POA: Diagnosis not present

## 2015-10-23 DIAGNOSIS — Z8679 Personal history of other diseases of the circulatory system: Secondary | ICD-10-CM | POA: Diagnosis not present

## 2015-10-23 DIAGNOSIS — I10 Essential (primary) hypertension: Secondary | ICD-10-CM | POA: Diagnosis not present

## 2015-10-23 DIAGNOSIS — E785 Hyperlipidemia, unspecified: Secondary | ICD-10-CM | POA: Diagnosis not present

## 2015-10-23 DIAGNOSIS — I509 Heart failure, unspecified: Secondary | ICD-10-CM | POA: Diagnosis not present

## 2015-10-23 DIAGNOSIS — R262 Difficulty in walking, not elsewhere classified: Secondary | ICD-10-CM | POA: Diagnosis not present

## 2015-10-24 DIAGNOSIS — I5030 Unspecified diastolic (congestive) heart failure: Secondary | ICD-10-CM | POA: Diagnosis not present

## 2015-10-24 DIAGNOSIS — E662 Morbid (severe) obesity with alveolar hypoventilation: Secondary | ICD-10-CM | POA: Diagnosis not present

## 2015-10-26 DIAGNOSIS — J449 Chronic obstructive pulmonary disease, unspecified: Secondary | ICD-10-CM | POA: Diagnosis not present

## 2015-10-26 DIAGNOSIS — E785 Hyperlipidemia, unspecified: Secondary | ICD-10-CM | POA: Diagnosis not present

## 2015-10-26 DIAGNOSIS — I10 Essential (primary) hypertension: Secondary | ICD-10-CM | POA: Diagnosis not present

## 2015-10-26 DIAGNOSIS — Z8639 Personal history of other endocrine, nutritional and metabolic disease: Secondary | ICD-10-CM | POA: Diagnosis not present

## 2015-10-26 DIAGNOSIS — Z9981 Dependence on supplemental oxygen: Secondary | ICD-10-CM | POA: Diagnosis not present

## 2015-10-26 DIAGNOSIS — R262 Difficulty in walking, not elsewhere classified: Secondary | ICD-10-CM | POA: Diagnosis not present

## 2015-10-26 DIAGNOSIS — Z8679 Personal history of other diseases of the circulatory system: Secondary | ICD-10-CM | POA: Diagnosis not present

## 2015-10-26 DIAGNOSIS — M6281 Muscle weakness (generalized): Secondary | ICD-10-CM | POA: Diagnosis not present

## 2015-10-26 DIAGNOSIS — I509 Heart failure, unspecified: Secondary | ICD-10-CM | POA: Diagnosis not present

## 2015-10-26 NOTE — Telephone Encounter (Signed)
Spoke with Jamie Montoya she said she's doing "good" but doesn't feel like the prednisone 15m qd is helping.  Sill having "diarrhea days"  She wants to know if she can cut down on the prednisone. Please advise Sir, thank you.  Made appointment for 01/02/16.

## 2015-10-26 NOTE — Telephone Encounter (Signed)
Reduce prednisone to 20 mg qd x 2 weeks then give me update

## 2015-10-27 NOTE — Telephone Encounter (Signed)
Informed patient of new prednisone directions .

## 2015-10-30 DIAGNOSIS — R262 Difficulty in walking, not elsewhere classified: Secondary | ICD-10-CM | POA: Diagnosis not present

## 2015-10-30 DIAGNOSIS — E785 Hyperlipidemia, unspecified: Secondary | ICD-10-CM | POA: Diagnosis not present

## 2015-10-30 DIAGNOSIS — Z8679 Personal history of other diseases of the circulatory system: Secondary | ICD-10-CM | POA: Diagnosis not present

## 2015-10-30 DIAGNOSIS — M6281 Muscle weakness (generalized): Secondary | ICD-10-CM | POA: Diagnosis not present

## 2015-10-30 DIAGNOSIS — Z9981 Dependence on supplemental oxygen: Secondary | ICD-10-CM | POA: Diagnosis not present

## 2015-10-30 DIAGNOSIS — Z8639 Personal history of other endocrine, nutritional and metabolic disease: Secondary | ICD-10-CM | POA: Diagnosis not present

## 2015-10-30 DIAGNOSIS — J449 Chronic obstructive pulmonary disease, unspecified: Secondary | ICD-10-CM | POA: Diagnosis not present

## 2015-10-30 DIAGNOSIS — I10 Essential (primary) hypertension: Secondary | ICD-10-CM | POA: Diagnosis not present

## 2015-10-30 DIAGNOSIS — I509 Heart failure, unspecified: Secondary | ICD-10-CM | POA: Diagnosis not present

## 2015-11-02 DIAGNOSIS — Z9981 Dependence on supplemental oxygen: Secondary | ICD-10-CM | POA: Diagnosis not present

## 2015-11-02 DIAGNOSIS — I10 Essential (primary) hypertension: Secondary | ICD-10-CM | POA: Diagnosis not present

## 2015-11-02 DIAGNOSIS — E785 Hyperlipidemia, unspecified: Secondary | ICD-10-CM | POA: Diagnosis not present

## 2015-11-02 DIAGNOSIS — I509 Heart failure, unspecified: Secondary | ICD-10-CM | POA: Diagnosis not present

## 2015-11-02 DIAGNOSIS — Z8639 Personal history of other endocrine, nutritional and metabolic disease: Secondary | ICD-10-CM | POA: Diagnosis not present

## 2015-11-02 DIAGNOSIS — M6281 Muscle weakness (generalized): Secondary | ICD-10-CM | POA: Diagnosis not present

## 2015-11-02 DIAGNOSIS — R262 Difficulty in walking, not elsewhere classified: Secondary | ICD-10-CM | POA: Diagnosis not present

## 2015-11-02 DIAGNOSIS — J449 Chronic obstructive pulmonary disease, unspecified: Secondary | ICD-10-CM | POA: Diagnosis not present

## 2015-11-02 DIAGNOSIS — Z8679 Personal history of other diseases of the circulatory system: Secondary | ICD-10-CM | POA: Diagnosis not present

## 2015-11-06 DIAGNOSIS — I509 Heart failure, unspecified: Secondary | ICD-10-CM | POA: Diagnosis not present

## 2015-11-06 DIAGNOSIS — R262 Difficulty in walking, not elsewhere classified: Secondary | ICD-10-CM | POA: Diagnosis not present

## 2015-11-06 DIAGNOSIS — I10 Essential (primary) hypertension: Secondary | ICD-10-CM | POA: Diagnosis not present

## 2015-11-06 DIAGNOSIS — J449 Chronic obstructive pulmonary disease, unspecified: Secondary | ICD-10-CM | POA: Diagnosis not present

## 2015-11-06 DIAGNOSIS — Z8639 Personal history of other endocrine, nutritional and metabolic disease: Secondary | ICD-10-CM | POA: Diagnosis not present

## 2015-11-06 DIAGNOSIS — E785 Hyperlipidemia, unspecified: Secondary | ICD-10-CM | POA: Diagnosis not present

## 2015-11-06 DIAGNOSIS — Z8679 Personal history of other diseases of the circulatory system: Secondary | ICD-10-CM | POA: Diagnosis not present

## 2015-11-06 DIAGNOSIS — M6281 Muscle weakness (generalized): Secondary | ICD-10-CM | POA: Diagnosis not present

## 2015-11-06 DIAGNOSIS — Z9981 Dependence on supplemental oxygen: Secondary | ICD-10-CM | POA: Diagnosis not present

## 2015-11-08 MED ORDER — PREDNISONE 10 MG PO TABS: 20.0000 mg | ORAL_TABLET | Freq: Every day | ORAL | Status: DC

## 2015-11-08 NOTE — Telephone Encounter (Signed)
Stay on prednisone 20 mg daily - refill if needed x 2 She needs to see me before I go on vacay

## 2015-11-08 NOTE — Telephone Encounter (Signed)
Jamie Montoya took her last prednisone 66m today.  Her stool is more formed but still having diarrhea.  She has been 3-4 times today, no fever.  She wonders if this is just going to be her normal.  Please advise if she needs to continue the prednisone Sir, thank you.

## 2015-11-08 NOTE — Telephone Encounter (Signed)
Spoke with Jamie Montoya and we moved her appointment to 11/24/15 at 9:15AM.  Refilled her prednisone.

## 2015-11-09 DIAGNOSIS — Z8639 Personal history of other endocrine, nutritional and metabolic disease: Secondary | ICD-10-CM | POA: Diagnosis not present

## 2015-11-09 DIAGNOSIS — M6281 Muscle weakness (generalized): Secondary | ICD-10-CM | POA: Diagnosis not present

## 2015-11-09 DIAGNOSIS — Z9981 Dependence on supplemental oxygen: Secondary | ICD-10-CM | POA: Diagnosis not present

## 2015-11-09 DIAGNOSIS — E785 Hyperlipidemia, unspecified: Secondary | ICD-10-CM | POA: Diagnosis not present

## 2015-11-09 DIAGNOSIS — J449 Chronic obstructive pulmonary disease, unspecified: Secondary | ICD-10-CM | POA: Diagnosis not present

## 2015-11-09 DIAGNOSIS — Z8679 Personal history of other diseases of the circulatory system: Secondary | ICD-10-CM | POA: Diagnosis not present

## 2015-11-09 DIAGNOSIS — R262 Difficulty in walking, not elsewhere classified: Secondary | ICD-10-CM | POA: Diagnosis not present

## 2015-11-09 DIAGNOSIS — I509 Heart failure, unspecified: Secondary | ICD-10-CM | POA: Diagnosis not present

## 2015-11-09 DIAGNOSIS — I10 Essential (primary) hypertension: Secondary | ICD-10-CM | POA: Diagnosis not present

## 2015-11-10 DIAGNOSIS — E039 Hypothyroidism, unspecified: Secondary | ICD-10-CM | POA: Diagnosis not present

## 2015-11-13 DIAGNOSIS — I509 Heart failure, unspecified: Secondary | ICD-10-CM | POA: Diagnosis not present

## 2015-11-13 DIAGNOSIS — I10 Essential (primary) hypertension: Secondary | ICD-10-CM | POA: Diagnosis not present

## 2015-11-13 DIAGNOSIS — E785 Hyperlipidemia, unspecified: Secondary | ICD-10-CM | POA: Diagnosis not present

## 2015-11-13 DIAGNOSIS — Z8639 Personal history of other endocrine, nutritional and metabolic disease: Secondary | ICD-10-CM | POA: Diagnosis not present

## 2015-11-13 DIAGNOSIS — Z8679 Personal history of other diseases of the circulatory system: Secondary | ICD-10-CM | POA: Diagnosis not present

## 2015-11-13 DIAGNOSIS — J449 Chronic obstructive pulmonary disease, unspecified: Secondary | ICD-10-CM | POA: Diagnosis not present

## 2015-11-13 DIAGNOSIS — M6281 Muscle weakness (generalized): Secondary | ICD-10-CM | POA: Diagnosis not present

## 2015-11-13 DIAGNOSIS — R262 Difficulty in walking, not elsewhere classified: Secondary | ICD-10-CM | POA: Diagnosis not present

## 2015-11-13 DIAGNOSIS — Z9981 Dependence on supplemental oxygen: Secondary | ICD-10-CM | POA: Diagnosis not present

## 2015-11-16 DIAGNOSIS — I509 Heart failure, unspecified: Secondary | ICD-10-CM | POA: Diagnosis not present

## 2015-11-16 DIAGNOSIS — I10 Essential (primary) hypertension: Secondary | ICD-10-CM | POA: Diagnosis not present

## 2015-11-16 DIAGNOSIS — Z8639 Personal history of other endocrine, nutritional and metabolic disease: Secondary | ICD-10-CM | POA: Diagnosis not present

## 2015-11-16 DIAGNOSIS — J449 Chronic obstructive pulmonary disease, unspecified: Secondary | ICD-10-CM | POA: Diagnosis not present

## 2015-11-16 DIAGNOSIS — Z8679 Personal history of other diseases of the circulatory system: Secondary | ICD-10-CM | POA: Diagnosis not present

## 2015-11-16 DIAGNOSIS — Z9981 Dependence on supplemental oxygen: Secondary | ICD-10-CM | POA: Diagnosis not present

## 2015-11-16 DIAGNOSIS — R262 Difficulty in walking, not elsewhere classified: Secondary | ICD-10-CM | POA: Diagnosis not present

## 2015-11-16 DIAGNOSIS — E785 Hyperlipidemia, unspecified: Secondary | ICD-10-CM | POA: Diagnosis not present

## 2015-11-16 DIAGNOSIS — M6281 Muscle weakness (generalized): Secondary | ICD-10-CM | POA: Diagnosis not present

## 2015-11-21 DIAGNOSIS — J449 Chronic obstructive pulmonary disease, unspecified: Secondary | ICD-10-CM | POA: Diagnosis not present

## 2015-11-21 DIAGNOSIS — Z8679 Personal history of other diseases of the circulatory system: Secondary | ICD-10-CM | POA: Diagnosis not present

## 2015-11-21 DIAGNOSIS — M6281 Muscle weakness (generalized): Secondary | ICD-10-CM | POA: Diagnosis not present

## 2015-11-21 DIAGNOSIS — I10 Essential (primary) hypertension: Secondary | ICD-10-CM | POA: Diagnosis not present

## 2015-11-21 DIAGNOSIS — E785 Hyperlipidemia, unspecified: Secondary | ICD-10-CM | POA: Diagnosis not present

## 2015-11-21 DIAGNOSIS — Z8639 Personal history of other endocrine, nutritional and metabolic disease: Secondary | ICD-10-CM | POA: Diagnosis not present

## 2015-11-21 DIAGNOSIS — I509 Heart failure, unspecified: Secondary | ICD-10-CM | POA: Diagnosis not present

## 2015-11-21 DIAGNOSIS — Z9981 Dependence on supplemental oxygen: Secondary | ICD-10-CM | POA: Diagnosis not present

## 2015-11-21 DIAGNOSIS — R262 Difficulty in walking, not elsewhere classified: Secondary | ICD-10-CM | POA: Diagnosis not present

## 2015-11-23 DIAGNOSIS — E785 Hyperlipidemia, unspecified: Secondary | ICD-10-CM | POA: Diagnosis not present

## 2015-11-23 DIAGNOSIS — M6281 Muscle weakness (generalized): Secondary | ICD-10-CM | POA: Diagnosis not present

## 2015-11-23 DIAGNOSIS — I10 Essential (primary) hypertension: Secondary | ICD-10-CM | POA: Diagnosis not present

## 2015-11-23 DIAGNOSIS — Z8639 Personal history of other endocrine, nutritional and metabolic disease: Secondary | ICD-10-CM | POA: Diagnosis not present

## 2015-11-23 DIAGNOSIS — R262 Difficulty in walking, not elsewhere classified: Secondary | ICD-10-CM | POA: Diagnosis not present

## 2015-11-23 DIAGNOSIS — Z9981 Dependence on supplemental oxygen: Secondary | ICD-10-CM | POA: Diagnosis not present

## 2015-11-23 DIAGNOSIS — I509 Heart failure, unspecified: Secondary | ICD-10-CM | POA: Diagnosis not present

## 2015-11-23 DIAGNOSIS — Z8679 Personal history of other diseases of the circulatory system: Secondary | ICD-10-CM | POA: Diagnosis not present

## 2015-11-23 DIAGNOSIS — J449 Chronic obstructive pulmonary disease, unspecified: Secondary | ICD-10-CM | POA: Diagnosis not present

## 2015-11-24 ENCOUNTER — Ambulatory Visit (INDEPENDENT_AMBULATORY_CARE_PROVIDER_SITE_OTHER): Payer: Medicare Other | Admitting: Internal Medicine

## 2015-11-24 ENCOUNTER — Encounter: Payer: Self-pay | Admitting: Internal Medicine

## 2015-11-24 VITALS — BP 100/50 | HR 76 | Ht 64.0 in | Wt 308.0 lb

## 2015-11-24 DIAGNOSIS — K51919 Ulcerative colitis, unspecified with unspecified complications: Secondary | ICD-10-CM | POA: Diagnosis not present

## 2015-11-24 DIAGNOSIS — I509 Heart failure, unspecified: Secondary | ICD-10-CM | POA: Diagnosis not present

## 2015-11-24 DIAGNOSIS — J9611 Chronic respiratory failure with hypoxia: Secondary | ICD-10-CM

## 2015-11-24 DIAGNOSIS — E662 Morbid (severe) obesity with alveolar hypoventilation: Secondary | ICD-10-CM | POA: Diagnosis not present

## 2015-11-24 DIAGNOSIS — I5030 Unspecified diastolic (congestive) heart failure: Secondary | ICD-10-CM | POA: Diagnosis not present

## 2015-11-24 NOTE — Patient Instructions (Signed)
  We will call you in a month to check on how your doing.   Continue your medicines with no changes.    Follow up with [place] in 2 months.    I appreciate the opportunity to care for you. Silvano Rusk, MD, [facility]

## 2015-11-24 NOTE — Assessment & Plan Note (Signed)
Improving on prednisone Continue current dose and stay on sulfasalazine See me 2 mos Hoping to get her back under control on sulfasalazine Did bring up possibility of biologics
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
## 2015-11-26 ENCOUNTER — Encounter: Payer: Self-pay | Admitting: Internal Medicine

## 2015-12-07 ENCOUNTER — Ambulatory Visit (INDEPENDENT_AMBULATORY_CARE_PROVIDER_SITE_OTHER): Payer: Medicare Other | Admitting: Sports Medicine

## 2015-12-07 ENCOUNTER — Encounter: Payer: Self-pay | Admitting: Sports Medicine

## 2015-12-07 DIAGNOSIS — M79672 Pain in left foot: Secondary | ICD-10-CM | POA: Diagnosis not present

## 2015-12-07 DIAGNOSIS — I739 Peripheral vascular disease, unspecified: Secondary | ICD-10-CM

## 2015-12-07 DIAGNOSIS — B351 Tinea unguium: Secondary | ICD-10-CM | POA: Diagnosis not present

## 2015-12-07 DIAGNOSIS — M79671 Pain in right foot: Secondary | ICD-10-CM

## 2015-12-07 NOTE — Progress Notes (Signed)
Patient ID: Jamie Montoya, female   DOB: Dec 14, 1945, 70 y.o.   MRN: [id number] Subjective: Jamie Montoya is a 70 y.o. female patient seen today in office with complaint of painful thickened and elongated toenails; unable to trim. Patient also wants to have her left heel looked at. States that she feels like there is a hard spot on the bottom has been having increased tenderness with first few steps out of bed in the morning. However, she is unable to see the bottom of her feet and wanted to make sure that there is no callus like she had once before. Also admits to a past history of plantar fasciitis. Patient denies history of Diabetes or Neuropathy. Admits to Vascular disease with trouble with swelling in her legs and the skin breaking open, however, no recent opening. Patient has no other pedal complaints at this time.   Patient Active Problem List   Diagnosis Date Noted  . History of colonic polyps   . Personal history of adenomatous colonic polyps 11/13/2011  . GERD with stricture 09/20/2010  . OBESITY, MORBID 09/06/2010  . COPD 09/06/2010  . Ulcerative colitis, chronic ([place]) 09/06/2010    Current Outpatient Prescriptions on File Prior to Visit  Medication Sig Dispense Refill  . Biotin 5000 MCG TABS Take 1 tablet by mouth at bedtime.    . carvedilol (COREG) 6.25 MG tablet Take 6.25 mg by mouth 2 (two) times daily with a meal.    . cholestyramine (QUESTRAN) 4 g packet Take 4 g by mouth 2 (two) times daily.    . furosemide (LASIX) 40 MG tablet Take 40 mg by mouth 2 (two) times daily.     Marland Kitchen levothyroxine (SYNTHROID, LEVOTHROID) 300 MCG tablet Take 300 mcg by mouth daily before breakfast.    . potassium chloride SA (K-DUR,KLOR-CON) 20 MEQ tablet Take 20 mEq by mouth at bedtime.     . pravastatin (PRAVACHOL) 20 MG tablet Take 20 mg by mouth at bedtime.     . predniSONE (DELTASONE) 10 MG tablet Take 2 tablets (20 mg total) by mouth daily with breakfast. 100 tablet 1  . sulfaSALAzine (AZULFIDINE)  500 MG tablet Take 2 tablets (1,000 mg total) by mouth 4 (four) times daily. 720 tablet 1   No current facility-administered medications on file prior to visit.    No Known Allergies  Objective: Physical Exam  General: Well developed, nourished, no acute distress, awake, alert and oriented x 3, Obese  Vascular: Dorsalis pedis artery 0/4 bilateral, Posterior tibial artery 0/4 bilateral, due to trophic skin changes, severe edema, nonpitting in nature, likely consistent with lymphedema , skin temperature warm to warm proximal to distal bilateral lower extremities, varicosities with brawny pigmentation to both legs, no pedal hair present bilateral.  Neurological: Gross sensation present via light touch bilateral. Protective and vibratory sensation intact bilateral.  Dermatological: Skin is warm, dry, and supple bilateral, Nails 1-10 are tender, long, thick, and discolored with mild subungal debris, no webspace macerations present bilateral, no open lesions present bilateral, no callus/corns/hyperkeratotic tissue present bilateral. No signs of infection bilateral.  Musculoskeletal: Mild tenderness to palpation at the left heel at insertion of plantar fascia, likely recurrent in nature. Muscular strength within normal limits for patient status without pain on range of motion. No pain with calf compression bilateral.  Assessment and Plan:  Problem List Items Addressed This Visit    None    Visit Diagnoses    Heel pain, left    -  Primary  Dermatophytosis of nail        Foot pain, bilateral        PVD (peripheral vascular disease) ([place])          -Examined patient.  -Discussed treatment options for painful mycotic nails and likely heel pain secondary to recurrent plantar fasciitis on left -Mechanically debrided and reduced mycotic nails with sterile nail nipper and dremel nail file without incident. -Advised patient to perform daily stretching exercises before activity or getting out of  bed in the morning as instructed -Applied heel cushion to left shoe and advised patient to do the same for her shoes at home -Advised patient to refrain from walking barefoot or in shoes that offered no support -Advised patient that if left heel pain persists to return to office for further evaluation and possible steroid injection -Encouraged lower limb elevation for edema control -Patient to return as needed for follow up evaluation or sooner if symptoms worsen.  Landis Martins, DPM

## 2015-12-18 ENCOUNTER — Telehealth: Payer: Self-pay | Admitting: Internal Medicine

## 2015-12-18 ENCOUNTER — Other Ambulatory Visit: Payer: Self-pay

## 2015-12-18 MED ORDER — PREDNISONE 5 MG PO TABS: ORAL_TABLET | ORAL | Status: DC

## 2015-12-18 NOTE — Telephone Encounter (Signed)
Yes taper to 15 mg daily 7 days, 10 mg daily 7 days and 5 mg daily 7 days. Continue sulfasalazine

## 2015-12-18 NOTE — Telephone Encounter (Signed)
Patient instructed. Call back at end of the taper or sooner if she acutely worsens.

## 2015-12-18 NOTE — Telephone Encounter (Signed)
Doc of the day UC patient of Dr Carlean Purl. She is on prednisone 20 mg daily and Sulfasalazine. Plan is to taper the prednisone as she gets back on Sulfasalazine. She reports she is having 2 formed stools a day. No pain, blood or diarrhea. She states she feels much better. She has 4 days of Prednisone left. Can she begin a taper?

## 2015-12-24 DIAGNOSIS — E662 Morbid (severe) obesity with alveolar hypoventilation: Secondary | ICD-10-CM | POA: Diagnosis not present

## 2015-12-24 DIAGNOSIS — I5030 Unspecified diastolic (congestive) heart failure: Secondary | ICD-10-CM | POA: Diagnosis not present

## 2015-12-27 ENCOUNTER — Telehealth: Payer: Self-pay

## 2015-12-27 NOTE — Telephone Encounter (Signed)
  Called Jamie Montoya to get an update on how she's doing.  Said she's been [employer] a had brief diarrhea episode last week but now seems all better.  She is going to call back and set up an August appointment.

## 2015-12-27 NOTE — Telephone Encounter (Signed)
-----   Message from Caffie Sotto E [place], [place] sent at 11/24/2015 10:03 AM EDT ----- Call and check on her, also make august f/u appt.

## 2016-01-02 ENCOUNTER — Ambulatory Visit: Payer: Medicare Other | Admitting: Internal Medicine

## 2016-01-04 DIAGNOSIS — E039 Hypothyroidism, unspecified: Secondary | ICD-10-CM | POA: Diagnosis not present

## 2016-01-24 DIAGNOSIS — E662 Morbid (severe) obesity with alveolar hypoventilation: Secondary | ICD-10-CM | POA: Diagnosis not present

## 2016-01-24 DIAGNOSIS — I5030 Unspecified diastolic (congestive) heart failure: Secondary | ICD-10-CM | POA: Diagnosis not present

## 2016-02-11 ENCOUNTER — Other Ambulatory Visit: Payer: Self-pay | Admitting: Internal Medicine

## 2016-02-18 DIAGNOSIS — I87393 Chronic venous hypertension (idiopathic) with other complications of bilateral lower extremity: Secondary | ICD-10-CM | POA: Diagnosis not present

## 2016-02-18 DIAGNOSIS — Z602 Problems related to living alone: Secondary | ICD-10-CM | POA: Diagnosis not present

## 2016-02-18 DIAGNOSIS — I110 Hypertensive heart disease with heart failure: Secondary | ICD-10-CM | POA: Diagnosis not present

## 2016-02-18 DIAGNOSIS — I5030 Unspecified diastolic (congestive) heart failure: Secondary | ICD-10-CM | POA: Diagnosis not present

## 2016-02-18 DIAGNOSIS — J449 Chronic obstructive pulmonary disease, unspecified: Secondary | ICD-10-CM | POA: Diagnosis not present

## 2016-02-18 DIAGNOSIS — K5190 Ulcerative colitis, unspecified, without complications: Secondary | ICD-10-CM | POA: Diagnosis not present

## 2016-02-18 DIAGNOSIS — Z87891 Personal history of nicotine dependence: Secondary | ICD-10-CM | POA: Diagnosis not present

## 2016-02-18 DIAGNOSIS — R7303 Prediabetes: Secondary | ICD-10-CM | POA: Diagnosis not present

## 2016-02-18 DIAGNOSIS — Z9981 Dependence on supplemental oxygen: Secondary | ICD-10-CM | POA: Diagnosis not present

## 2016-02-21 DIAGNOSIS — Z602 Problems related to living alone: Secondary | ICD-10-CM | POA: Diagnosis not present

## 2016-02-21 DIAGNOSIS — J449 Chronic obstructive pulmonary disease, unspecified: Secondary | ICD-10-CM | POA: Diagnosis not present

## 2016-02-21 DIAGNOSIS — R7303 Prediabetes: Secondary | ICD-10-CM | POA: Diagnosis not present

## 2016-02-21 DIAGNOSIS — I5030 Unspecified diastolic (congestive) heart failure: Secondary | ICD-10-CM | POA: Diagnosis not present

## 2016-02-21 DIAGNOSIS — K5190 Ulcerative colitis, unspecified, without complications: Secondary | ICD-10-CM | POA: Diagnosis not present

## 2016-02-21 DIAGNOSIS — I87393 Chronic venous hypertension (idiopathic) with other complications of bilateral lower extremity: Secondary | ICD-10-CM | POA: Diagnosis not present

## 2016-02-21 DIAGNOSIS — Z87891 Personal history of nicotine dependence: Secondary | ICD-10-CM | POA: Diagnosis not present

## 2016-02-21 DIAGNOSIS — I110 Hypertensive heart disease with heart failure: Secondary | ICD-10-CM | POA: Diagnosis not present

## 2016-02-21 DIAGNOSIS — Z9981 Dependence on supplemental oxygen: Secondary | ICD-10-CM | POA: Diagnosis not present

## 2016-02-24 DIAGNOSIS — I5030 Unspecified diastolic (congestive) heart failure: Secondary | ICD-10-CM | POA: Diagnosis not present

## 2016-02-26 DIAGNOSIS — Z9981 Dependence on supplemental oxygen: Secondary | ICD-10-CM | POA: Diagnosis not present

## 2016-02-26 DIAGNOSIS — I87393 Chronic venous hypertension (idiopathic) with other complications of bilateral lower extremity: Secondary | ICD-10-CM | POA: Diagnosis not present

## 2016-02-26 DIAGNOSIS — Z602 Problems related to living alone: Secondary | ICD-10-CM | POA: Diagnosis not present

## 2016-02-26 DIAGNOSIS — I5030 Unspecified diastolic (congestive) heart failure: Secondary | ICD-10-CM | POA: Diagnosis not present

## 2016-02-26 DIAGNOSIS — K5190 Ulcerative colitis, unspecified, without complications: Secondary | ICD-10-CM | POA: Diagnosis not present

## 2016-02-26 DIAGNOSIS — R7303 Prediabetes: Secondary | ICD-10-CM | POA: Diagnosis not present

## 2016-02-26 DIAGNOSIS — Z87891 Personal history of nicotine dependence: Secondary | ICD-10-CM | POA: Diagnosis not present

## 2016-02-26 DIAGNOSIS — J449 Chronic obstructive pulmonary disease, unspecified: Secondary | ICD-10-CM | POA: Diagnosis not present

## 2016-02-26 DIAGNOSIS — I110 Hypertensive heart disease with heart failure: Secondary | ICD-10-CM | POA: Diagnosis not present

## 2016-02-29 DIAGNOSIS — I5030 Unspecified diastolic (congestive) heart failure: Secondary | ICD-10-CM | POA: Diagnosis not present

## 2016-02-29 DIAGNOSIS — Z9981 Dependence on supplemental oxygen: Secondary | ICD-10-CM | POA: Diagnosis not present

## 2016-02-29 DIAGNOSIS — Z602 Problems related to living alone: Secondary | ICD-10-CM | POA: Diagnosis not present

## 2016-02-29 DIAGNOSIS — I110 Hypertensive heart disease with heart failure: Secondary | ICD-10-CM | POA: Diagnosis not present

## 2016-02-29 DIAGNOSIS — Z87891 Personal history of nicotine dependence: Secondary | ICD-10-CM | POA: Diagnosis not present

## 2016-02-29 DIAGNOSIS — R7303 Prediabetes: Secondary | ICD-10-CM | POA: Diagnosis not present

## 2016-02-29 DIAGNOSIS — I87393 Chronic venous hypertension (idiopathic) with other complications of bilateral lower extremity: Secondary | ICD-10-CM | POA: Diagnosis not present

## 2016-02-29 DIAGNOSIS — K5190 Ulcerative colitis, unspecified, without complications: Secondary | ICD-10-CM | POA: Diagnosis not present

## 2016-02-29 DIAGNOSIS — J449 Chronic obstructive pulmonary disease, unspecified: Secondary | ICD-10-CM | POA: Diagnosis not present

## 2016-03-04 DIAGNOSIS — J449 Chronic obstructive pulmonary disease, unspecified: Secondary | ICD-10-CM | POA: Diagnosis not present

## 2016-03-04 DIAGNOSIS — Z87891 Personal history of nicotine dependence: Secondary | ICD-10-CM | POA: Diagnosis not present

## 2016-03-04 DIAGNOSIS — I5030 Unspecified diastolic (congestive) heart failure: Secondary | ICD-10-CM | POA: Diagnosis not present

## 2016-03-04 DIAGNOSIS — Z9981 Dependence on supplemental oxygen: Secondary | ICD-10-CM | POA: Diagnosis not present

## 2016-03-04 DIAGNOSIS — K5190 Ulcerative colitis, unspecified, without complications: Secondary | ICD-10-CM | POA: Diagnosis not present

## 2016-03-04 DIAGNOSIS — R7303 Prediabetes: Secondary | ICD-10-CM | POA: Diagnosis not present

## 2016-03-04 DIAGNOSIS — I87393 Chronic venous hypertension (idiopathic) with other complications of bilateral lower extremity: Secondary | ICD-10-CM | POA: Diagnosis not present

## 2016-03-04 DIAGNOSIS — I110 Hypertensive heart disease with heart failure: Secondary | ICD-10-CM | POA: Diagnosis not present

## 2016-03-04 DIAGNOSIS — Z602 Problems related to living alone: Secondary | ICD-10-CM | POA: Diagnosis not present

## 2016-03-07 DIAGNOSIS — I5030 Unspecified diastolic (congestive) heart failure: Secondary | ICD-10-CM | POA: Diagnosis not present

## 2016-03-07 DIAGNOSIS — I87393 Chronic venous hypertension (idiopathic) with other complications of bilateral lower extremity: Secondary | ICD-10-CM | POA: Diagnosis not present

## 2016-03-07 DIAGNOSIS — Z602 Problems related to living alone: Secondary | ICD-10-CM | POA: Diagnosis not present

## 2016-03-07 DIAGNOSIS — Z9981 Dependence on supplemental oxygen: Secondary | ICD-10-CM | POA: Diagnosis not present

## 2016-03-07 DIAGNOSIS — I110 Hypertensive heart disease with heart failure: Secondary | ICD-10-CM | POA: Diagnosis not present

## 2016-03-07 DIAGNOSIS — K5190 Ulcerative colitis, unspecified, without complications: Secondary | ICD-10-CM | POA: Diagnosis not present

## 2016-03-07 DIAGNOSIS — J449 Chronic obstructive pulmonary disease, unspecified: Secondary | ICD-10-CM | POA: Diagnosis not present

## 2016-03-07 DIAGNOSIS — Z87891 Personal history of nicotine dependence: Secondary | ICD-10-CM | POA: Diagnosis not present

## 2016-03-07 DIAGNOSIS — R7303 Prediabetes: Secondary | ICD-10-CM | POA: Diagnosis not present

## 2016-03-12 DIAGNOSIS — K5190 Ulcerative colitis, unspecified, without complications: Secondary | ICD-10-CM | POA: Diagnosis not present

## 2016-03-12 DIAGNOSIS — J449 Chronic obstructive pulmonary disease, unspecified: Secondary | ICD-10-CM | POA: Diagnosis not present

## 2016-03-12 DIAGNOSIS — R7303 Prediabetes: Secondary | ICD-10-CM | POA: Diagnosis not present

## 2016-03-12 DIAGNOSIS — Z602 Problems related to living alone: Secondary | ICD-10-CM | POA: Diagnosis not present

## 2016-03-12 DIAGNOSIS — Z87891 Personal history of nicotine dependence: Secondary | ICD-10-CM | POA: Diagnosis not present

## 2016-03-12 DIAGNOSIS — I87393 Chronic venous hypertension (idiopathic) with other complications of bilateral lower extremity: Secondary | ICD-10-CM | POA: Diagnosis not present

## 2016-03-12 DIAGNOSIS — Z9981 Dependence on supplemental oxygen: Secondary | ICD-10-CM | POA: Diagnosis not present

## 2016-03-12 DIAGNOSIS — I110 Hypertensive heart disease with heart failure: Secondary | ICD-10-CM | POA: Diagnosis not present

## 2016-03-12 DIAGNOSIS — I5030 Unspecified diastolic (congestive) heart failure: Secondary | ICD-10-CM | POA: Diagnosis not present

## 2016-03-13 DIAGNOSIS — E782 Mixed hyperlipidemia: Secondary | ICD-10-CM | POA: Diagnosis not present

## 2016-03-13 DIAGNOSIS — R0602 Shortness of breath: Secondary | ICD-10-CM | POA: Diagnosis not present

## 2016-03-13 DIAGNOSIS — R7303 Prediabetes: Secondary | ICD-10-CM | POA: Diagnosis not present

## 2016-03-13 DIAGNOSIS — R600 Localized edema: Secondary | ICD-10-CM | POA: Diagnosis not present

## 2016-03-13 DIAGNOSIS — R202 Paresthesia of skin: Secondary | ICD-10-CM | POA: Diagnosis not present

## 2016-03-13 DIAGNOSIS — E039 Hypothyroidism, unspecified: Secondary | ICD-10-CM | POA: Diagnosis not present

## 2016-03-14 DIAGNOSIS — I5030 Unspecified diastolic (congestive) heart failure: Secondary | ICD-10-CM | POA: Diagnosis not present

## 2016-03-14 DIAGNOSIS — K5190 Ulcerative colitis, unspecified, without complications: Secondary | ICD-10-CM | POA: Diagnosis not present

## 2016-03-14 DIAGNOSIS — J449 Chronic obstructive pulmonary disease, unspecified: Secondary | ICD-10-CM | POA: Diagnosis not present

## 2016-03-14 DIAGNOSIS — I110 Hypertensive heart disease with heart failure: Secondary | ICD-10-CM | POA: Diagnosis not present

## 2016-03-14 DIAGNOSIS — Z87891 Personal history of nicotine dependence: Secondary | ICD-10-CM | POA: Diagnosis not present

## 2016-03-14 DIAGNOSIS — R7303 Prediabetes: Secondary | ICD-10-CM | POA: Diagnosis not present

## 2016-03-14 DIAGNOSIS — I87393 Chronic venous hypertension (idiopathic) with other complications of bilateral lower extremity: Secondary | ICD-10-CM | POA: Diagnosis not present

## 2016-03-14 DIAGNOSIS — Z602 Problems related to living alone: Secondary | ICD-10-CM | POA: Diagnosis not present

## 2016-03-14 DIAGNOSIS — Z9981 Dependence on supplemental oxygen: Secondary | ICD-10-CM | POA: Diagnosis not present

## 2016-03-15 DIAGNOSIS — M79605 Pain in left leg: Secondary | ICD-10-CM | POA: Diagnosis not present

## 2016-03-15 DIAGNOSIS — R600 Localized edema: Secondary | ICD-10-CM | POA: Diagnosis not present

## 2016-03-19 DIAGNOSIS — I5030 Unspecified diastolic (congestive) heart failure: Secondary | ICD-10-CM | POA: Diagnosis not present

## 2016-03-19 DIAGNOSIS — I87393 Chronic venous hypertension (idiopathic) with other complications of bilateral lower extremity: Secondary | ICD-10-CM | POA: Diagnosis not present

## 2016-03-19 DIAGNOSIS — J449 Chronic obstructive pulmonary disease, unspecified: Secondary | ICD-10-CM | POA: Diagnosis not present

## 2016-03-19 DIAGNOSIS — K5190 Ulcerative colitis, unspecified, without complications: Secondary | ICD-10-CM | POA: Diagnosis not present

## 2016-03-19 DIAGNOSIS — Z602 Problems related to living alone: Secondary | ICD-10-CM | POA: Diagnosis not present

## 2016-03-19 DIAGNOSIS — R7303 Prediabetes: Secondary | ICD-10-CM | POA: Diagnosis not present

## 2016-03-19 DIAGNOSIS — Z87891 Personal history of nicotine dependence: Secondary | ICD-10-CM | POA: Diagnosis not present

## 2016-03-19 DIAGNOSIS — I110 Hypertensive heart disease with heart failure: Secondary | ICD-10-CM | POA: Diagnosis not present

## 2016-03-19 DIAGNOSIS — Z9981 Dependence on supplemental oxygen: Secondary | ICD-10-CM | POA: Diagnosis not present

## 2016-03-21 DIAGNOSIS — I5030 Unspecified diastolic (congestive) heart failure: Secondary | ICD-10-CM | POA: Diagnosis not present

## 2016-03-21 DIAGNOSIS — I87393 Chronic venous hypertension (idiopathic) with other complications of bilateral lower extremity: Secondary | ICD-10-CM | POA: Diagnosis not present

## 2016-03-21 DIAGNOSIS — I110 Hypertensive heart disease with heart failure: Secondary | ICD-10-CM | POA: Diagnosis not present

## 2016-03-21 DIAGNOSIS — R7303 Prediabetes: Secondary | ICD-10-CM | POA: Diagnosis not present

## 2016-03-21 DIAGNOSIS — Z9981 Dependence on supplemental oxygen: Secondary | ICD-10-CM | POA: Diagnosis not present

## 2016-03-21 DIAGNOSIS — Z602 Problems related to living alone: Secondary | ICD-10-CM | POA: Diagnosis not present

## 2016-03-21 DIAGNOSIS — K5190 Ulcerative colitis, unspecified, without complications: Secondary | ICD-10-CM | POA: Diagnosis not present

## 2016-03-21 DIAGNOSIS — Z87891 Personal history of nicotine dependence: Secondary | ICD-10-CM | POA: Diagnosis not present

## 2016-03-21 DIAGNOSIS — J449 Chronic obstructive pulmonary disease, unspecified: Secondary | ICD-10-CM | POA: Diagnosis not present

## 2016-03-25 DIAGNOSIS — Z602 Problems related to living alone: Secondary | ICD-10-CM | POA: Diagnosis not present

## 2016-03-25 DIAGNOSIS — R7303 Prediabetes: Secondary | ICD-10-CM | POA: Diagnosis not present

## 2016-03-25 DIAGNOSIS — I87393 Chronic venous hypertension (idiopathic) with other complications of bilateral lower extremity: Secondary | ICD-10-CM | POA: Diagnosis not present

## 2016-03-25 DIAGNOSIS — I5030 Unspecified diastolic (congestive) heart failure: Secondary | ICD-10-CM | POA: Diagnosis not present

## 2016-03-25 DIAGNOSIS — I110 Hypertensive heart disease with heart failure: Secondary | ICD-10-CM | POA: Diagnosis not present

## 2016-03-25 DIAGNOSIS — J449 Chronic obstructive pulmonary disease, unspecified: Secondary | ICD-10-CM | POA: Diagnosis not present

## 2016-03-25 DIAGNOSIS — Z87891 Personal history of nicotine dependence: Secondary | ICD-10-CM | POA: Diagnosis not present

## 2016-03-25 DIAGNOSIS — Z9981 Dependence on supplemental oxygen: Secondary | ICD-10-CM | POA: Diagnosis not present

## 2016-03-25 DIAGNOSIS — K5190 Ulcerative colitis, unspecified, without complications: Secondary | ICD-10-CM | POA: Diagnosis not present

## 2016-03-28 DIAGNOSIS — I87393 Chronic venous hypertension (idiopathic) with other complications of bilateral lower extremity: Secondary | ICD-10-CM | POA: Diagnosis not present

## 2016-03-28 DIAGNOSIS — R7303 Prediabetes: Secondary | ICD-10-CM | POA: Diagnosis not present

## 2016-03-28 DIAGNOSIS — J449 Chronic obstructive pulmonary disease, unspecified: Secondary | ICD-10-CM | POA: Diagnosis not present

## 2016-03-28 DIAGNOSIS — I5030 Unspecified diastolic (congestive) heart failure: Secondary | ICD-10-CM | POA: Diagnosis not present

## 2016-03-28 DIAGNOSIS — K5190 Ulcerative colitis, unspecified, without complications: Secondary | ICD-10-CM | POA: Diagnosis not present

## 2016-03-28 DIAGNOSIS — Z87891 Personal history of nicotine dependence: Secondary | ICD-10-CM | POA: Diagnosis not present

## 2016-03-28 DIAGNOSIS — Z602 Problems related to living alone: Secondary | ICD-10-CM | POA: Diagnosis not present

## 2016-03-28 DIAGNOSIS — I110 Hypertensive heart disease with heart failure: Secondary | ICD-10-CM | POA: Diagnosis not present

## 2016-03-28 DIAGNOSIS — Z9981 Dependence on supplemental oxygen: Secondary | ICD-10-CM | POA: Diagnosis not present

## 2016-04-01 ENCOUNTER — Other Ambulatory Visit: Payer: Self-pay

## 2016-04-01 DIAGNOSIS — Z87891 Personal history of nicotine dependence: Secondary | ICD-10-CM | POA: Diagnosis not present

## 2016-04-01 DIAGNOSIS — I87393 Chronic venous hypertension (idiopathic) with other complications of bilateral lower extremity: Secondary | ICD-10-CM | POA: Diagnosis not present

## 2016-04-01 DIAGNOSIS — I5030 Unspecified diastolic (congestive) heart failure: Secondary | ICD-10-CM | POA: Diagnosis not present

## 2016-04-01 DIAGNOSIS — R7303 Prediabetes: Secondary | ICD-10-CM | POA: Diagnosis not present

## 2016-04-01 DIAGNOSIS — I110 Hypertensive heart disease with heart failure: Secondary | ICD-10-CM | POA: Diagnosis not present

## 2016-04-01 DIAGNOSIS — Z602 Problems related to living alone: Secondary | ICD-10-CM | POA: Diagnosis not present

## 2016-04-01 DIAGNOSIS — J449 Chronic obstructive pulmonary disease, unspecified: Secondary | ICD-10-CM | POA: Diagnosis not present

## 2016-04-01 DIAGNOSIS — K5190 Ulcerative colitis, unspecified, without complications: Secondary | ICD-10-CM | POA: Diagnosis not present

## 2016-04-01 DIAGNOSIS — Z9981 Dependence on supplemental oxygen: Secondary | ICD-10-CM | POA: Diagnosis not present

## 2016-04-01 MED ORDER — SULFASALAZINE 500 MG PO TABS: ORAL_TABLET | ORAL | 0 refills | Status: DC

## 2016-04-02 DIAGNOSIS — H25813 Combined forms of age-related cataract, bilateral: Secondary | ICD-10-CM | POA: Diagnosis not present

## 2016-04-02 DIAGNOSIS — H43811 Vitreous degeneration, right eye: Secondary | ICD-10-CM | POA: Diagnosis not present

## 2016-04-04 DIAGNOSIS — I110 Hypertensive heart disease with heart failure: Secondary | ICD-10-CM | POA: Diagnosis not present

## 2016-04-04 DIAGNOSIS — Z9981 Dependence on supplemental oxygen: Secondary | ICD-10-CM | POA: Diagnosis not present

## 2016-04-04 DIAGNOSIS — R7303 Prediabetes: Secondary | ICD-10-CM | POA: Diagnosis not present

## 2016-04-04 DIAGNOSIS — J449 Chronic obstructive pulmonary disease, unspecified: Secondary | ICD-10-CM | POA: Diagnosis not present

## 2016-04-04 DIAGNOSIS — I87393 Chronic venous hypertension (idiopathic) with other complications of bilateral lower extremity: Secondary | ICD-10-CM | POA: Diagnosis not present

## 2016-04-04 DIAGNOSIS — I5030 Unspecified diastolic (congestive) heart failure: Secondary | ICD-10-CM | POA: Diagnosis not present

## 2016-04-04 DIAGNOSIS — K5190 Ulcerative colitis, unspecified, without complications: Secondary | ICD-10-CM | POA: Diagnosis not present

## 2016-04-04 DIAGNOSIS — Z87891 Personal history of nicotine dependence: Secondary | ICD-10-CM | POA: Diagnosis not present

## 2016-04-04 DIAGNOSIS — Z602 Problems related to living alone: Secondary | ICD-10-CM | POA: Diagnosis not present

## 2016-04-08 DIAGNOSIS — I87393 Chronic venous hypertension (idiopathic) with other complications of bilateral lower extremity: Secondary | ICD-10-CM | POA: Diagnosis not present

## 2016-04-08 DIAGNOSIS — Z602 Problems related to living alone: Secondary | ICD-10-CM | POA: Diagnosis not present

## 2016-04-08 DIAGNOSIS — Z9981 Dependence on supplemental oxygen: Secondary | ICD-10-CM | POA: Diagnosis not present

## 2016-04-08 DIAGNOSIS — Z87891 Personal history of nicotine dependence: Secondary | ICD-10-CM | POA: Diagnosis not present

## 2016-04-08 DIAGNOSIS — K5190 Ulcerative colitis, unspecified, without complications: Secondary | ICD-10-CM | POA: Diagnosis not present

## 2016-04-08 DIAGNOSIS — I5030 Unspecified diastolic (congestive) heart failure: Secondary | ICD-10-CM | POA: Diagnosis not present

## 2016-04-08 DIAGNOSIS — J449 Chronic obstructive pulmonary disease, unspecified: Secondary | ICD-10-CM | POA: Diagnosis not present

## 2016-04-08 DIAGNOSIS — R7303 Prediabetes: Secondary | ICD-10-CM | POA: Diagnosis not present

## 2016-04-08 DIAGNOSIS — I110 Hypertensive heart disease with heart failure: Secondary | ICD-10-CM | POA: Diagnosis not present

## 2016-04-11 DIAGNOSIS — R7303 Prediabetes: Secondary | ICD-10-CM | POA: Diagnosis not present

## 2016-04-11 DIAGNOSIS — Z9981 Dependence on supplemental oxygen: Secondary | ICD-10-CM | POA: Diagnosis not present

## 2016-04-11 DIAGNOSIS — I87393 Chronic venous hypertension (idiopathic) with other complications of bilateral lower extremity: Secondary | ICD-10-CM | POA: Diagnosis not present

## 2016-04-11 DIAGNOSIS — J449 Chronic obstructive pulmonary disease, unspecified: Secondary | ICD-10-CM | POA: Diagnosis not present

## 2016-04-11 DIAGNOSIS — K5190 Ulcerative colitis, unspecified, without complications: Secondary | ICD-10-CM | POA: Diagnosis not present

## 2016-04-11 DIAGNOSIS — I5030 Unspecified diastolic (congestive) heart failure: Secondary | ICD-10-CM | POA: Diagnosis not present

## 2016-04-11 DIAGNOSIS — I110 Hypertensive heart disease with heart failure: Secondary | ICD-10-CM | POA: Diagnosis not present

## 2016-04-11 DIAGNOSIS — Z87891 Personal history of nicotine dependence: Secondary | ICD-10-CM | POA: Diagnosis not present

## 2016-04-11 DIAGNOSIS — Z602 Problems related to living alone: Secondary | ICD-10-CM | POA: Diagnosis not present

## 2016-04-15 DIAGNOSIS — K5190 Ulcerative colitis, unspecified, without complications: Secondary | ICD-10-CM | POA: Diagnosis not present

## 2016-04-15 DIAGNOSIS — J449 Chronic obstructive pulmonary disease, unspecified: Secondary | ICD-10-CM | POA: Diagnosis not present

## 2016-04-15 DIAGNOSIS — I5030 Unspecified diastolic (congestive) heart failure: Secondary | ICD-10-CM | POA: Diagnosis not present

## 2016-04-15 DIAGNOSIS — R7303 Prediabetes: Secondary | ICD-10-CM | POA: Diagnosis not present

## 2016-04-15 DIAGNOSIS — Z9981 Dependence on supplemental oxygen: Secondary | ICD-10-CM | POA: Diagnosis not present

## 2016-04-15 DIAGNOSIS — Z602 Problems related to living alone: Secondary | ICD-10-CM | POA: Diagnosis not present

## 2016-04-15 DIAGNOSIS — I110 Hypertensive heart disease with heart failure: Secondary | ICD-10-CM | POA: Diagnosis not present

## 2016-04-15 DIAGNOSIS — I87393 Chronic venous hypertension (idiopathic) with other complications of bilateral lower extremity: Secondary | ICD-10-CM | POA: Diagnosis not present

## 2016-04-15 DIAGNOSIS — Z87891 Personal history of nicotine dependence: Secondary | ICD-10-CM | POA: Diagnosis not present

## 2016-04-17 DIAGNOSIS — Z602 Problems related to living alone: Secondary | ICD-10-CM | POA: Diagnosis not present

## 2016-04-17 DIAGNOSIS — K5190 Ulcerative colitis, unspecified, without complications: Secondary | ICD-10-CM | POA: Diagnosis not present

## 2016-04-17 DIAGNOSIS — I5030 Unspecified diastolic (congestive) heart failure: Secondary | ICD-10-CM | POA: Diagnosis not present

## 2016-04-17 DIAGNOSIS — R7303 Prediabetes: Secondary | ICD-10-CM | POA: Diagnosis not present

## 2016-04-17 DIAGNOSIS — I87393 Chronic venous hypertension (idiopathic) with other complications of bilateral lower extremity: Secondary | ICD-10-CM | POA: Diagnosis not present

## 2016-04-17 DIAGNOSIS — Z87891 Personal history of nicotine dependence: Secondary | ICD-10-CM | POA: Diagnosis not present

## 2016-04-17 DIAGNOSIS — I110 Hypertensive heart disease with heart failure: Secondary | ICD-10-CM | POA: Diagnosis not present

## 2016-04-17 DIAGNOSIS — J449 Chronic obstructive pulmonary disease, unspecified: Secondary | ICD-10-CM | POA: Diagnosis not present

## 2016-04-17 DIAGNOSIS — Z9981 Dependence on supplemental oxygen: Secondary | ICD-10-CM | POA: Diagnosis not present

## 2016-04-18 DIAGNOSIS — H2511 Age-related nuclear cataract, right eye: Secondary | ICD-10-CM | POA: Diagnosis not present

## 2016-04-22 DIAGNOSIS — Z9981 Dependence on supplemental oxygen: Secondary | ICD-10-CM | POA: Diagnosis not present

## 2016-04-22 DIAGNOSIS — I5030 Unspecified diastolic (congestive) heart failure: Secondary | ICD-10-CM | POA: Diagnosis not present

## 2016-04-22 DIAGNOSIS — J449 Chronic obstructive pulmonary disease, unspecified: Secondary | ICD-10-CM | POA: Diagnosis not present

## 2016-04-22 DIAGNOSIS — R7303 Prediabetes: Secondary | ICD-10-CM | POA: Diagnosis not present

## 2016-04-22 DIAGNOSIS — K5190 Ulcerative colitis, unspecified, without complications: Secondary | ICD-10-CM | POA: Diagnosis not present

## 2016-04-22 DIAGNOSIS — I87393 Chronic venous hypertension (idiopathic) with other complications of bilateral lower extremity: Secondary | ICD-10-CM | POA: Diagnosis not present

## 2016-04-22 DIAGNOSIS — I110 Hypertensive heart disease with heart failure: Secondary | ICD-10-CM | POA: Diagnosis not present

## 2016-04-22 DIAGNOSIS — Z87891 Personal history of nicotine dependence: Secondary | ICD-10-CM | POA: Diagnosis not present

## 2016-04-22 DIAGNOSIS — Z602 Problems related to living alone: Secondary | ICD-10-CM | POA: Diagnosis not present

## 2016-05-14 ENCOUNTER — Ambulatory Visit (INDEPENDENT_AMBULATORY_CARE_PROVIDER_SITE_OTHER): Payer: Medicare Other | Admitting: Podiatry

## 2016-05-14 ENCOUNTER — Encounter: Payer: Self-pay | Admitting: Podiatry

## 2016-05-14 DIAGNOSIS — Q828 Other specified congenital malformations of skin: Secondary | ICD-10-CM | POA: Diagnosis not present

## 2016-05-14 DIAGNOSIS — M722 Plantar fascial fibromatosis: Secondary | ICD-10-CM

## 2016-05-14 NOTE — Progress Notes (Signed)
She presents today with a chief complaint of left heel pain. She states that Dr. Amalia Hailey used to remove small corns or calluses from her heel and he gave her a shot once. She states that that really seem to help.  Objective: Vital signs are stable alert and oriented 3. Pulses are palpable. Pulses are palpable no open lesions or wounds that she does have a solitary porokeratotic lesion to the plantar aspect of her left heel which I debrided today without incident. She also has pain on palpation medially continue to go the left heel.  Assessment: Plantar fasciitis with porokeratosis left foot.  Plan: Injected the left heel today with Kenalog and local anesthetic 10 mg was utilized. I also do nucleated lesion today to her left heel with no iatrogenic lesions. Follow-up on an as-needed basis with Dr. Amalia Hailey or myself.

## 2016-07-11 ENCOUNTER — Ambulatory Visit: Payer: Medicare Other | Admitting: Podiatry

## 2016-07-18 ENCOUNTER — Ambulatory Visit (INDEPENDENT_AMBULATORY_CARE_PROVIDER_SITE_OTHER): Payer: Medicare Other | Admitting: Podiatry

## 2016-07-18 ENCOUNTER — Encounter: Payer: Self-pay | Admitting: Podiatry

## 2016-07-18 ENCOUNTER — Ambulatory Visit (INDEPENDENT_AMBULATORY_CARE_PROVIDER_SITE_OTHER): Payer: Medicare Other

## 2016-07-18 DIAGNOSIS — M722 Plantar fascial fibromatosis: Secondary | ICD-10-CM

## 2016-07-18 DIAGNOSIS — R609 Edema, unspecified: Secondary | ICD-10-CM | POA: Diagnosis not present

## 2016-07-18 NOTE — Progress Notes (Signed)
She presents today with a chief complaint of pain to her left heel. She states it is still very tender and painful. She denies trauma. States the injection lasted for about 2 weeks and the pain came back.  Objective: Vital signs are stable she is alert and oriented 3 she has a myriad of medical problems least of which is severe lymphedema to the bilateral lower extremity. She has history of cellulitis. History of congestive heart failure but does not see a cardiologist. She is also using oxygen on a regular basis. Radiographs taken today demonstrate severe edema bilateral lower extremity with pes planus plantar fasciitis is noted severe loss of plantar fat pad is noted.  Assessment: Severe lymphedema plantar fasciitis and pes planus left.  Plan: I injected the area today with Kenalog and local anesthetic we also referred her to [facility] cardiology as well as [facility] lymphedema clinic. I will follow-up with her in the near future.

## 2016-07-19 NOTE — Addendum Note (Signed)
Addended by: Harriett Sine D on: 07/19/2016 01:02 PM   Modules accepted: Orders

## 2016-07-25 ENCOUNTER — Encounter: Payer: Self-pay | Admitting: *Deleted

## 2016-07-25 DIAGNOSIS — Z1389 Encounter for screening for other disorder: Secondary | ICD-10-CM | POA: Diagnosis not present

## 2016-07-25 DIAGNOSIS — Z9181 History of falling: Secondary | ICD-10-CM | POA: Diagnosis not present

## 2016-07-25 DIAGNOSIS — I890 Lymphedema, not elsewhere classified: Secondary | ICD-10-CM | POA: Diagnosis not present

## 2016-07-25 DIAGNOSIS — L03115 Cellulitis of right lower limb: Secondary | ICD-10-CM | POA: Diagnosis not present

## 2016-07-26 DIAGNOSIS — I5030 Unspecified diastolic (congestive) heart failure: Secondary | ICD-10-CM | POA: Diagnosis not present

## 2016-07-30 ENCOUNTER — Inpatient Hospital Stay (HOSPITAL_COMMUNITY)
Admission: EM | Admit: 2016-07-30 | Discharge: 2016-08-05 | DRG: 291 | Disposition: A | Discharge status: Home Health | Payer: Medicare Other | Attending: Internal Medicine | Admitting: Internal Medicine

## 2016-07-30 ENCOUNTER — Encounter (HOSPITAL_COMMUNITY): Payer: Self-pay | Admitting: Emergency Medicine

## 2016-07-30 ENCOUNTER — Emergency Department (HOSPITAL_COMMUNITY): Payer: Medicare Other

## 2016-07-30 ENCOUNTER — Ambulatory Visit: Payer: Medicare Other | Admitting: Physician Assistant

## 2016-07-30 DIAGNOSIS — I509 Heart failure, unspecified: Secondary | ICD-10-CM | POA: Diagnosis not present

## 2016-07-30 DIAGNOSIS — E876 Hypokalemia: Secondary | ICD-10-CM | POA: Diagnosis not present

## 2016-07-30 DIAGNOSIS — R0902 Hypoxemia: Secondary | ICD-10-CM | POA: Diagnosis not present

## 2016-07-30 DIAGNOSIS — Z79899 Other long term (current) drug therapy: Secondary | ICD-10-CM | POA: Diagnosis not present

## 2016-07-30 DIAGNOSIS — Z87891 Personal history of nicotine dependence: Secondary | ICD-10-CM

## 2016-07-30 DIAGNOSIS — E039 Hypothyroidism, unspecified: Secondary | ICD-10-CM | POA: Diagnosis present

## 2016-07-30 DIAGNOSIS — R0602 Shortness of breath: Secondary | ICD-10-CM | POA: Diagnosis not present

## 2016-07-30 DIAGNOSIS — K222 Esophageal obstruction: Secondary | ICD-10-CM | POA: Diagnosis not present

## 2016-07-30 DIAGNOSIS — R635 Abnormal weight gain: Secondary | ICD-10-CM

## 2016-07-30 DIAGNOSIS — F419 Anxiety disorder, unspecified: Secondary | ICD-10-CM | POA: Diagnosis present

## 2016-07-30 DIAGNOSIS — I5033 Acute on chronic diastolic (congestive) heart failure: Principal | ICD-10-CM | POA: Diagnosis present

## 2016-07-30 DIAGNOSIS — E785 Hyperlipidemia, unspecified: Secondary | ICD-10-CM | POA: Diagnosis not present

## 2016-07-30 DIAGNOSIS — I872 Venous insufficiency (chronic) (peripheral): Secondary | ICD-10-CM | POA: Diagnosis present

## 2016-07-30 DIAGNOSIS — J449 Chronic obstructive pulmonary disease, unspecified: Secondary | ICD-10-CM | POA: Diagnosis not present

## 2016-07-30 DIAGNOSIS — J9621 Acute and chronic respiratory failure with hypoxia: Secondary | ICD-10-CM | POA: Diagnosis not present

## 2016-07-30 DIAGNOSIS — Z9981 Dependence on supplemental oxygen: Secondary | ICD-10-CM | POA: Diagnosis not present

## 2016-07-30 DIAGNOSIS — K219 Gastro-esophageal reflux disease without esophagitis: Secondary | ICD-10-CM | POA: Diagnosis present

## 2016-07-30 DIAGNOSIS — Z6841 Body Mass Index (BMI) 40.0 and over, adult: Secondary | ICD-10-CM | POA: Diagnosis not present

## 2016-07-30 DIAGNOSIS — I878 Other specified disorders of veins: Secondary | ICD-10-CM | POA: Diagnosis not present

## 2016-07-30 DIAGNOSIS — I472 Ventricular tachycardia: Secondary | ICD-10-CM | POA: Diagnosis not present

## 2016-07-30 DIAGNOSIS — K5190 Ulcerative colitis, unspecified, without complications: Secondary | ICD-10-CM | POA: Diagnosis present

## 2016-07-30 DIAGNOSIS — I5023 Acute on chronic systolic (congestive) heart failure: Secondary | ICD-10-CM

## 2016-07-30 DIAGNOSIS — T502X5A Adverse effect of carbonic-anhydrase inhibitors, benzothiadiazides and other diuretics, initial encounter: Secondary | ICD-10-CM | POA: Diagnosis not present

## 2016-07-30 DIAGNOSIS — H6122 Impacted cerumen, left ear: Secondary | ICD-10-CM | POA: Diagnosis not present

## 2016-07-30 DIAGNOSIS — K51919 Ulcerative colitis, unspecified with unspecified complications: Secondary | ICD-10-CM | POA: Diagnosis not present

## 2016-07-30 DIAGNOSIS — R140 Abdominal distension (gaseous): Secondary | ICD-10-CM | POA: Diagnosis not present

## 2016-07-30 DIAGNOSIS — M7989 Other specified soft tissue disorders: Secondary | ICD-10-CM

## 2016-07-30 DIAGNOSIS — E66813 Obesity, class 3: Secondary | ICD-10-CM | POA: Diagnosis present

## 2016-07-30 DIAGNOSIS — R6889 Other general symptoms and signs: Secondary | ICD-10-CM

## 2016-07-30 LAB — I-STAT VENOUS BLOOD GAS, ED
ACID-BASE EXCESS: 19 mmol/L — AB (ref 0.0–2.0)
BICARBONATE: 49.2 mmol/L — AB (ref 20.0–28.0)
O2 Saturation: 84 %
pCO2, Ven: 84.5 mmHg (ref 44.0–60.0)
pH, Ven: 7.373 (ref 7.250–7.430)
pO2, Ven: 55 mmHg — ABNORMAL HIGH (ref 32.0–45.0)

## 2016-07-30 LAB — BLOOD GAS, VENOUS

## 2016-07-30 LAB — BASIC METABOLIC PANEL
Anion gap: 10 (ref 5–15)
BUN: 12 mg/dL (ref 6–20)
CHLORIDE: 89 mmol/L — AB (ref 101–111)
CO2: 41 mmol/L — AB (ref 22–32)
CREATININE: 0.89 mg/dL (ref 0.44–1.00)
Calcium: 9.8 mg/dL (ref 8.9–10.3)
GFR calc Af Amer: >60 mL/min (ref >60)
GFR calc non Af Amer: >60 mL/min (ref >60)
Glucose, Bld: 90 mg/dL (ref 65–99)
POTASSIUM: 3.9 mmol/L (ref 3.5–5.1)
Sodium: 140 mmol/L (ref 135–145)

## 2016-07-30 LAB — HEPATIC FUNCTION PANEL
ALBUMIN: 3.1 g/dL — AB (ref 3.5–5.0)
ALT: 12 U/L — ABNORMAL LOW (ref 14–54)
AST: 13 U/L — AB (ref 15–41)
Alkaline Phosphatase: 53 U/L (ref 38–126)
BILIRUBIN TOTAL: 0.4 mg/dL (ref 0.3–1.2)
Bilirubin, Direct: <0.1 mg/dL — ABNORMAL LOW (ref 0.1–0.5)
TOTAL PROTEIN: 6.7 g/dL (ref 6.5–8.1)

## 2016-07-30 LAB — CBC WITH DIFFERENTIAL/PLATELET
Basophils Absolute: 0 10*3/uL (ref 0.0–0.1)
Basophils Relative: 1 %
EOS ABS: 0.1 10*3/uL (ref 0.0–0.7)
Eosinophils Relative: 2 %
HEMATOCRIT: 37.9 % (ref 36.0–46.0)
HEMOGLOBIN: 10.9 g/dL — AB (ref 12.0–15.0)
LYMPHS ABS: 1 10*3/uL (ref 0.7–4.0)
LYMPHS PCT: 24 %
MCH: 30.7 pg (ref 26.0–34.0)
MCHC: 28.8 g/dL — AB (ref 30.0–36.0)
MCV: 106.8 fL — AB (ref 78.0–100.0)
MONOS PCT: 13 %
Monocytes Absolute: 0.6 10*3/uL (ref 0.1–1.0)
NEUTROS ABS: 2.6 10*3/uL (ref 1.7–7.7)
NEUTROS PCT: 60 %
Platelets: 159 10*3/uL (ref 150–400)
RBC: 3.55 MIL/uL — AB (ref 3.87–5.11)
RDW: 14.3 % (ref 11.5–15.5)
WBC: 4.2 10*3/uL (ref 4.0–10.5)

## 2016-07-30 LAB — I-STAT CHEM 8, ED
BUN: 8 mg/dL (ref 6–20)
CALCIUM ION: 1.1 mmol/L — AB (ref 1.15–1.40)
CHLORIDE: 99 mmol/L — AB (ref 101–111)
Creatinine, Ser: 0.9 mg/dL (ref 0.44–1.00)
GLUCOSE: 95 mg/dL (ref 65–99)
HCT: 41 % (ref 36.0–46.0)
Hemoglobin: 13.9 g/dL (ref 12.0–15.0)
POTASSIUM: 3.8 mmol/L (ref 3.5–5.1)
Sodium: 135 mmol/L (ref 135–145)
TCO2: 23 mmol/L (ref 0–100)

## 2016-07-30 LAB — BRAIN NATRIURETIC PEPTIDE: B Natriuretic Peptide: 85.2 pg/mL (ref 0.0–100.0)

## 2016-07-30 LAB — TSH: TSH: 0.393 u[IU]/mL (ref 0.350–4.500)

## 2016-07-30 LAB — TROPONIN I: Troponin I: <0.03 ng/mL (ref <0.03)

## 2016-07-30 IMAGING — CR DG CHEST 2V
2 series · 2 of 2 positions shown · non-contrast
Comparison: [DATE] chest radiograph.

CLINICAL DATA: Dyspnea. History of CHF and COPD. Lower extremity
swelling. Abdominal distension.

EXAM:
CHEST  2 VIEW

[chest lat]
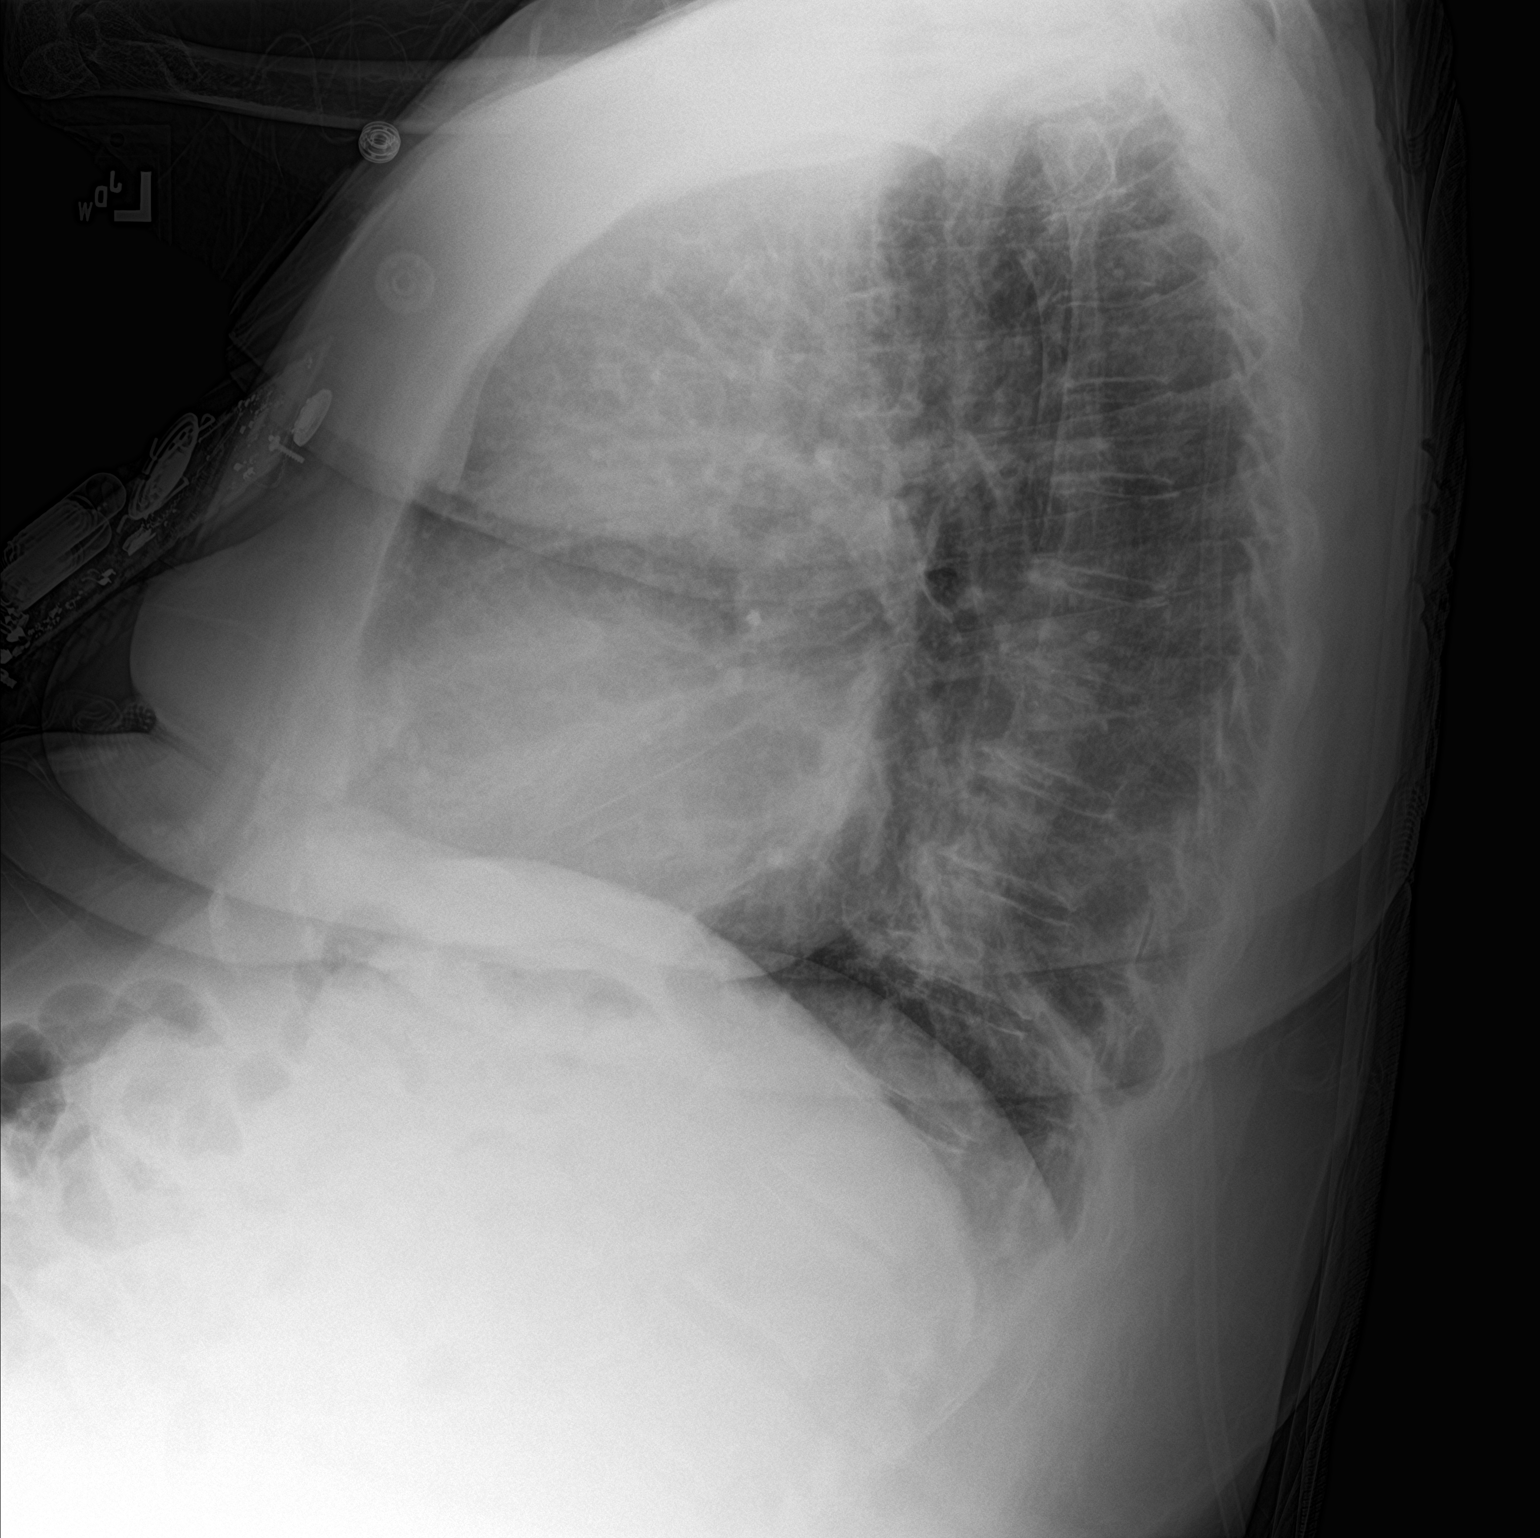

[chest ap]
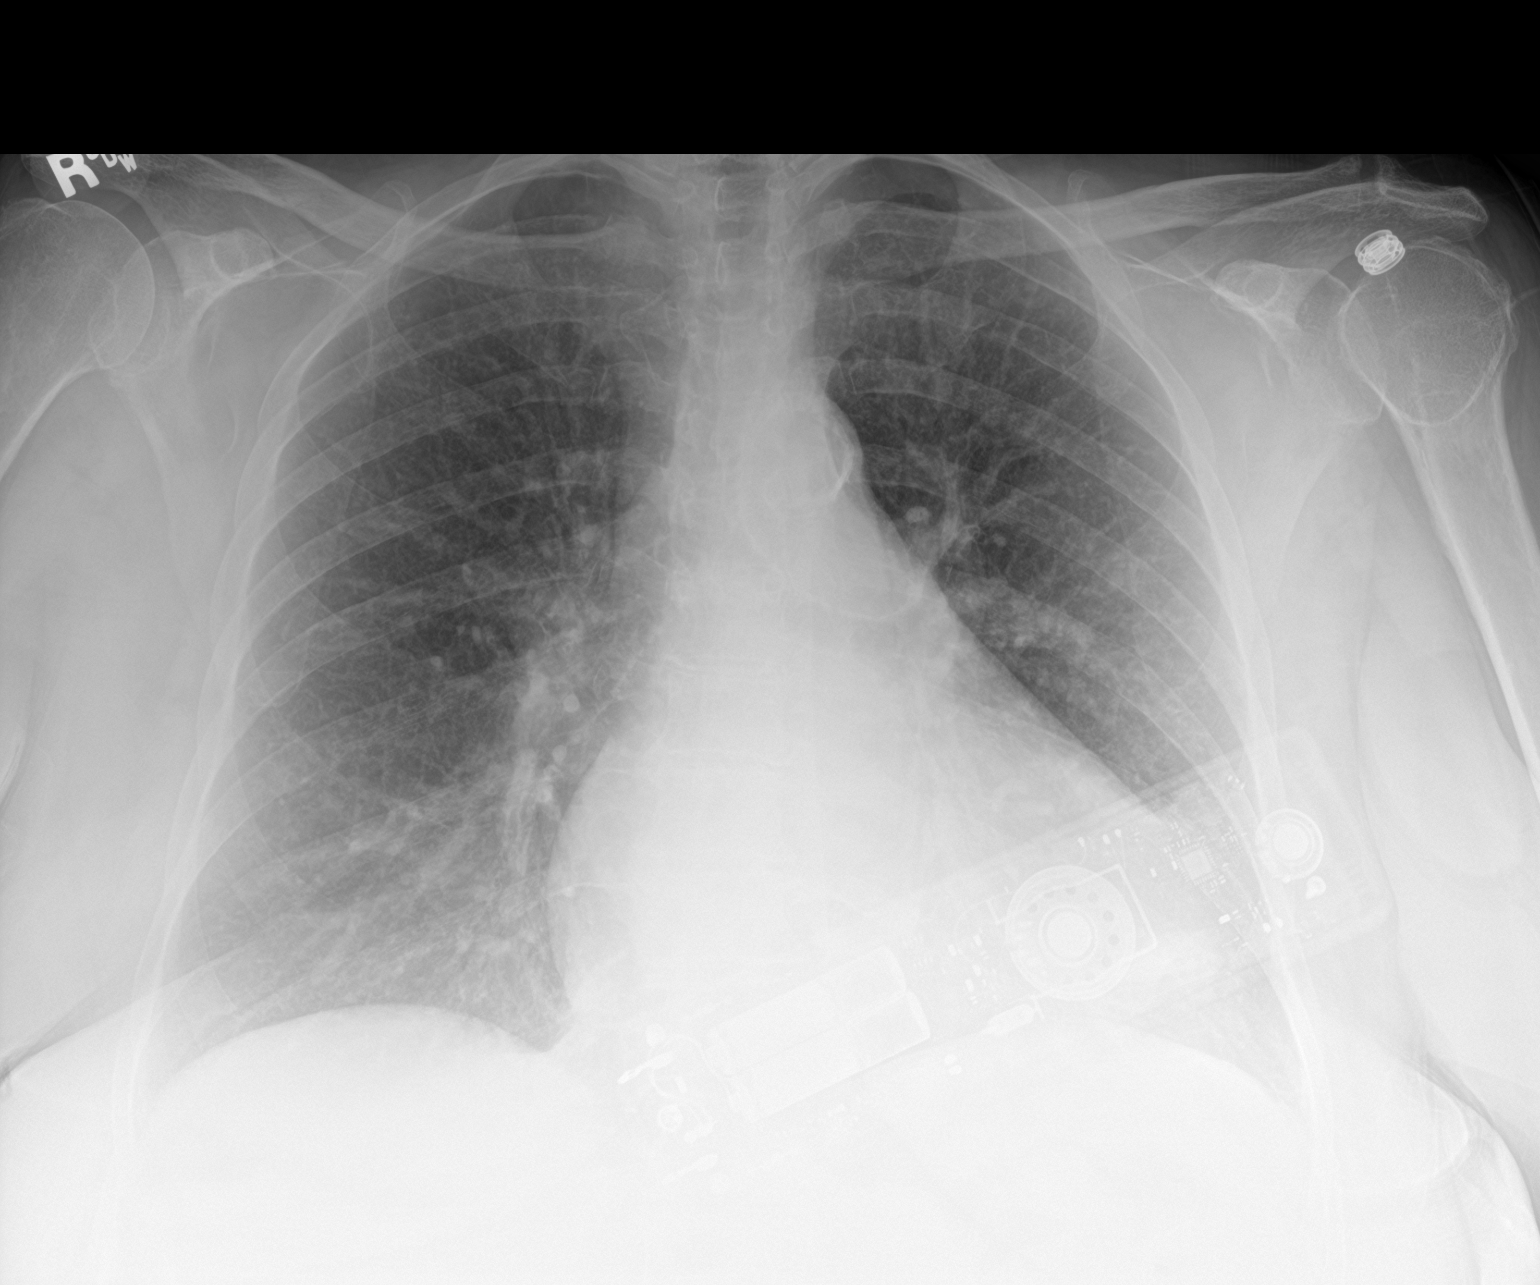

[2 of 2 positions shown; findings below may reference images not displayed]

FINDINGS: Stable cardiomediastinal silhouette with mild cardiomegaly and
aortic atherosclerosis. No pneumothorax. No pleural effusion.
Borderline mild pulmonary edema. Linear opacities overlying the
lower lobes on the lateral view.
IMPRESSION: 1. Borderline mild congestive heart failure.
2. Linear opacities overlying the lower lobes on the lateral view,
favor atelectasis.
3. Aortic atherosclerosis.

## 2016-07-30 MED ORDER — LEVOTHYROXINE SODIUM 100 MCG PO TABS
300.0000 ug | ORAL_TABLET | Freq: Every day | ORAL | Status: DC
  Administered 2016-07-31 – 2016-08-04 (×5): 300 ug via ORAL
  Filled 2016-07-30 (×3): qty 3
  Filled 2016-07-30: qty 2
  Filled 2016-07-30 (×3): qty 3

## 2016-07-30 MED ORDER — CHOLESTYRAMINE 4 G PO PACK
4.0000 g | PACK | Freq: Two times a day (BID) | ORAL | Status: DC
  Administered 2016-08-01 – 2016-08-02 (×5): 4 g via ORAL
  Filled 2016-07-30 (×10): qty 1

## 2016-07-30 MED ORDER — IPRATROPIUM-ALBUTEROL 0.5-2.5 (3) MG/3ML IN SOLN
3.0000 mL | Freq: Four times a day (QID) | RESPIRATORY_TRACT | Status: DC
  Administered 2016-07-31 (×4): 3 mL via RESPIRATORY_TRACT
  Filled 2016-07-30 (×4): qty 3

## 2016-07-30 MED ORDER — IPRATROPIUM-ALBUTEROL 0.5-2.5 (3) MG/3ML IN SOLN: 3.0000 mL | Freq: Three times a day (TID) | RESPIRATORY_TRACT | Status: DC

## 2016-07-30 MED ORDER — ZOLPIDEM TARTRATE 5 MG PO TABS: 5.0000 mg | ORAL_TABLET | Freq: Every evening | ORAL | Status: DC | PRN

## 2016-07-30 MED ORDER — SODIUM CHLORIDE 0.9% FLUSH
3.0000 mL | Freq: Two times a day (BID) | INTRAVENOUS | Status: DC
  Administered 2016-07-30 – 2016-08-04 (×11): 3 mL via INTRAVENOUS

## 2016-07-30 MED ORDER — SULFASALAZINE 500 MG PO TABS
2000.0000 mg | ORAL_TABLET | Freq: Two times a day (BID) | ORAL | Status: DC
  Administered 2016-07-30 – 2016-08-05 (×12): 2000 mg via ORAL
  Filled 2016-07-30 (×14): qty 4

## 2016-07-30 MED ORDER — FUROSEMIDE 10 MG/ML IJ SOLN
40.0000 mg | Freq: Once | INTRAMUSCULAR | Status: AC
  Administered 2016-07-30: 40 mg via INTRAVENOUS
  Filled 2016-07-30: qty 4

## 2016-07-30 MED ORDER — ACETAMINOPHEN 325 MG PO TABS
650.0000 mg | ORAL_TABLET | ORAL | Status: DC | PRN
  Filled 2016-07-30: qty 2

## 2016-07-30 MED ORDER — ENOXAPARIN SODIUM 40 MG/0.4ML ~~LOC~~ SOLN
40.0000 mg | SUBCUTANEOUS | Status: DC
  Administered 2016-08-01 – 2016-08-04 (×4): 40 mg via SUBCUTANEOUS
  Filled 2016-07-30 (×7): qty 0.4

## 2016-07-30 MED ORDER — ONDANSETRON HCL 4 MG/2ML IJ SOLN: 4.0000 mg | Freq: Four times a day (QID) | INTRAMUSCULAR | Status: DC | PRN

## 2016-07-30 MED ORDER — ALBUTEROL SULFATE (2.5 MG/3ML) 0.083% IN NEBU
3.0000 mL | INHALATION_SOLUTION | Freq: Four times a day (QID) | RESPIRATORY_TRACT | Status: DC | PRN
  Administered 2016-07-31: 3 mL via RESPIRATORY_TRACT
  Filled 2016-07-30: qty 3

## 2016-07-30 MED ORDER — LORAZEPAM 1 MG PO TABS
1.0000 mg | ORAL_TABLET | Freq: Three times a day (TID) | ORAL | Status: DC | PRN
  Filled 2016-07-30: qty 1

## 2016-07-30 MED ORDER — SODIUM CHLORIDE 0.9 % IV SOLN: 250.0000 mL | INTRAVENOUS | Status: DC | PRN

## 2016-07-30 MED ORDER — SACCHAROMYCES BOULARDII 250 MG PO CAPS
250.0000 mg | ORAL_CAPSULE | Freq: Two times a day (BID) | ORAL | Status: DC
  Administered 2016-07-30 – 2016-08-04 (×11): 250 mg via ORAL
  Filled 2016-07-30 (×12): qty 1

## 2016-07-30 MED ORDER — PRAVASTATIN SODIUM 20 MG PO TABS
20.0000 mg | ORAL_TABLET | Freq: Every day | ORAL | Status: DC
  Administered 2016-07-30 – 2016-08-04 (×6): 20 mg via ORAL
  Filled 2016-07-30 (×6): qty 1

## 2016-07-30 MED ORDER — CARVEDILOL 6.25 MG PO TABS
6.2500 mg | ORAL_TABLET | Freq: Two times a day (BID) | ORAL | Status: DC
  Administered 2016-07-31 – 2016-08-04 (×9): 6.25 mg via ORAL
  Filled 2016-07-30 (×10): qty 1

## 2016-07-30 MED ORDER — ASPIRIN EC 81 MG PO TBEC: 81.0000 mg | DELAYED_RELEASE_TABLET | Freq: Every day | ORAL | Status: DC

## 2016-07-30 MED ORDER — PROBIOTIC PO CAPS: 1.0000 | ORAL_CAPSULE | Freq: Every day | ORAL | Status: DC

## 2016-07-30 MED ORDER — DM-GUAIFENESIN ER 30-600 MG PO TB12
1.0000 | ORAL_TABLET | Freq: Two times a day (BID) | ORAL | Status: DC | PRN
  Filled 2016-07-30: qty 1

## 2016-07-30 MED ORDER — OXYMETAZOLINE HCL 0.05 % NA SOLN
1.0000 | Freq: Two times a day (BID) | NASAL | Status: DC | PRN
  Administered 2016-08-01: 1 via NASAL
  Filled 2016-07-30 (×2): qty 15

## 2016-07-30 MED ORDER — BIOTIN 5000 MCG PO TABS: 1.0000 | ORAL_TABLET | Freq: Every day | ORAL | Status: DC

## 2016-07-30 MED ORDER — DM-GUAIFENESIN ER 30-600 MG PO TB12: 1.0000 | ORAL_TABLET | Freq: Two times a day (BID) | ORAL | Status: DC

## 2016-07-30 MED ORDER — SODIUM CHLORIDE 0.9% FLUSH: 3.0000 mL | INTRAVENOUS | Status: DC | PRN

## 2016-07-30 MED ORDER — IPRATROPIUM-ALBUTEROL 0.5-2.5 (3) MG/3ML IN SOLN
RESPIRATORY_TRACT | Status: AC
  Administered 2016-07-30: 3 mL via RESPIRATORY_TRACT
  Filled 2016-07-30: qty 3

## 2016-07-30 MED ORDER — FUROSEMIDE 10 MG/ML IJ SOLN
80.0000 mg | Freq: Two times a day (BID) | INTRAMUSCULAR | Status: DC
  Administered 2016-07-31 – 2016-08-02 (×5): 80 mg via INTRAVENOUS
  Filled 2016-07-30 (×6): qty 8

## 2016-07-30 MED ORDER — IPRATROPIUM-ALBUTEROL 0.5-2.5 (3) MG/3ML IN SOLN
3.0000 mL | RESPIRATORY_TRACT | Status: DC
  Administered 2016-07-30: 3 mL via RESPIRATORY_TRACT

## 2016-07-30 NOTE — ED Notes (Signed)
After ambulation to restroom, pt's SPO2 noted to be 78% on 4L. EDP made aware. Pt agreeing to be admitted.
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
## 2016-07-30 NOTE — ED Triage Notes (Signed)
Pt sts 20 lb weight gain in 11 days and increased SOB; pt on home O2 at 4 L
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
## 2016-07-30 NOTE — ED Notes (Signed)
Attempted report x 1; name and call back number provided 

## 2016-07-30 NOTE — ED Notes (Signed)
MD Resident at the bedside

## 2016-07-31 ENCOUNTER — Inpatient Hospital Stay (HOSPITAL_COMMUNITY): Payer: Medicare Other

## 2016-07-31 DIAGNOSIS — J449 Chronic obstructive pulmonary disease, unspecified: Secondary | ICD-10-CM

## 2016-07-31 DIAGNOSIS — I509 Heart failure, unspecified: Secondary | ICD-10-CM

## 2016-07-31 DIAGNOSIS — E785 Hyperlipidemia, unspecified: Secondary | ICD-10-CM

## 2016-07-31 DIAGNOSIS — J9621 Acute and chronic respiratory failure with hypoxia: Secondary | ICD-10-CM

## 2016-07-31 DIAGNOSIS — I872 Venous insufficiency (chronic) (peripheral): Secondary | ICD-10-CM | POA: Diagnosis present

## 2016-07-31 DIAGNOSIS — K51919 Ulcerative colitis, unspecified with unspecified complications: Secondary | ICD-10-CM

## 2016-07-31 DIAGNOSIS — E039 Hypothyroidism, unspecified: Secondary | ICD-10-CM

## 2016-07-31 LAB — ECHOCARDIOGRAM COMPLETE
CHL CUP RV SYS PRESS: 36 mmHg
CHL CUP TV REG PEAK VELOCITY: 266 cm/s
E decel time: 250 msec
E/e' ratio: 17.46
FS: 46 % — AB (ref 28–44)
HEIGHTINCHES: 64.8 in
IV/PV OW: 0.92
LA diam index: 1.48 cm/m2
LA vol A4C: 114 ml
LA vol: 115 mL
LASIZE: 37 mm
LAVOLIN: 46 mL/m2
LDCA: 3.46 cm2
LEFT ATRIUM END SYS DIAM: 37 mm
LV E/e'average: 17.46
LV PW d: 9.83 mm — AB (ref 0.6–1.1)
LV e' LATERAL: 8.59 cm/s
LVEEMED: 17.46
LVOT SV: 115 mL
LVOT VTI: 33.2 cm
LVOT diameter: 21 mm
LVOT peak grad rest: 8 mmHg
LVOTPV: 138 cm/s
MV Dec: 250
MV Peak grad: 9 mmHg
MV pk A vel: 120 m/s
MV pk E vel: 150 m/s
TAPSE: 30 mm
TDI e' lateral: 8.59
TDI e' medial: 6.53
TR max vel: 266 cm/s
WEIGHTICAEL: 4529.13 [oz_av]

## 2016-07-31 LAB — BLOOD GAS, ARTERIAL
ACID-BASE EXCESS: 17.1 mmol/L — AB (ref 0.0–2.0)
BICARBONATE: 43 mmol/L — AB (ref 20.0–28.0)
Drawn by: 22563
FIO2: 50
O2 SAT: 94 %
PO2 ART: 76.6 mmHg — AB (ref 83.0–108.0)
Patient temperature: 98.1
pCO2 arterial: 71.9 mmHg (ref 32.0–48.0)
pH, Arterial: 7.393 (ref 7.350–7.450)

## 2016-07-31 LAB — BASIC METABOLIC PANEL
Anion gap: 10 (ref 5–15)
BUN: 11 mg/dL (ref 6–20)
CHLORIDE: 89 mmol/L — AB (ref 101–111)
CO2: 40 mmol/L — ABNORMAL HIGH (ref 22–32)
Calcium: 9.2 mg/dL (ref 8.9–10.3)
Creatinine, Ser: 0.97 mg/dL (ref 0.44–1.00)
GFR calc Af Amer: >60 mL/min (ref >60)
GFR calc non Af Amer: 57 mL/min — ABNORMAL LOW (ref >60)
Glucose, Bld: 92 mg/dL (ref 65–99)
POTASSIUM: 3.6 mmol/L (ref 3.5–5.1)
Sodium: 139 mmol/L (ref 135–145)

## 2016-07-31 LAB — MRSA PCR SCREENING: MRSA by PCR: NEGATIVE

## 2016-07-31 LAB — TROPONIN I: Troponin I: <0.03 ng/mL (ref <0.03)

## 2016-07-31 MED ORDER — FUROSEMIDE 10 MG/ML IJ SOLN
40.0000 mg | Freq: Once | INTRAMUSCULAR | Status: AC
  Administered 2016-07-31: 40 mg via INTRAVENOUS

## 2016-07-31 MED ORDER — PERFLUTREN LIPID MICROSPHERE
1.0000 mL | INTRAVENOUS | Status: AC | PRN
  Administered 2016-07-31: 2 mL via INTRAVENOUS
  Filled 2016-07-31 (×2): qty 10

## 2016-07-31 NOTE — Care Management Note (Addendum)
Case Management Note  Patient Details  Name: Jamie Montoya MRN: [id number] Date of Birth: 1946-06-04  Subjective/Objective:   Presents with acute on chronic resp failure with hypoxia, copd, chf, anxiety, patient lives alone, she states she has home oxygen with [facility] 2.5 liters, she also has a [facility] with [facility] that comes by to do her una boots, her daughter Trudi Ida [phone],  takes her to her MD apts , she has a PCP she would like to continue with [facility] with [facility].  Patient lives alone, she has a rolling walker, rollator, hospital bed, a rcliner chair, and a w/chair ramp.  NCM called [facility] and left message for a return call back.  NCM will cont to follow for dc needs.               Action/Plan:   Expected Discharge Date:                  Expected Discharge Plan:  [place]  In-House Referral:     Discharge planning Services  CM Consult  Post Acute Care Choice:    Choice offered to:     DME Arranged:    DME Agency:     HH Arranged:    [place] Agency:     Status of Service:  In process, will continue to follow  If discussed at Long Length of Stay Meetings, dates discussed:    Additional Comments:  Zenon Mayo, RN 07/31/2016, 2:43 PM

## 2016-07-31 NOTE — Progress Notes (Signed)
Progress Note    Jamie Montoya  SWF:[id number] DOB: Apr 17, 1946  DOA: 07/30/2016 PCP: Leonides Sake, MD    Brief Narrative:   Chief complaint: Follow-up dyspnea/hypoxia.  Jamie Montoya is an 71 y.o. female PMH of CHF (no prior 2-D echoes on record), ulcerative colitis, esophageal stenosis, obesity, chronic respiratory failure secondary COPD on 2.5 L of home oxygen, hypothyroidism and anxiety disorder was admitted 07/30/16 for evaluation of worsening dyspnea and weight gain. BNP was 8522 on admission. She was noted to be hypoxic with oxygen saturation of 76% on 2 L. Chest x-ray showed mild vascular congestion/atelectasis.  Assessment/Plan:   Principal Problem:   Acute on chronic respiratory failure with hypoxia (HCC) secondary to CHF exacerbation with 2-D echo pending to determine if this is systolic versus diastolic failure Patients worsening respiratory failure appears to be from a CHF exacerbation given her marked elevation of BNP and findings as noted on chest x-ray (personally reviewed). She has had 700 mL of diuresis on Lasix 80 mg twice a day. 2-D echocardiogram pending. Troponin negative 2. On Coreg already. Will add ACE-I/ARB if EF low on Echo.   Active Problems:   Chronic venous stasis dermatitis Evaluated by wound care nurse with Unna boots to be applied and changed every other day. Monitor closely for superimposed cellulitis as she is at high risk given impaired skin integrity.    OBESITY, MORBID Body mass index is 47.4 kg/m.    COPD (chronic obstructive pulmonary disease) (HCC) Continue supplemental oxygen. Continue bronchodilators.    Ulcerative colitis, chronic (HCC) Continue sulfasalazine.    HLD (hyperlipidemia) Continue Pravachol.    Hypothyroidism Continue Synthroid.    Anxiety Continue Ativan as needed.   Family Communication/Anticipated D/C date and plan/Code Status   DVT prophylaxis: Lovenox ordered. Code Status: Full Code.  Family  Communication: No family currently at the bedside. Disposition Plan: Likely will need another 48-72 hours in the hospital.   Medical Consultants:    None.   Procedures:    None  Anti-Infectives:    None  Subjective:   Patient reports a cough productive of white sputum, denies shortness of breath at rest although she clearly desaturates if her oxygen is dislodged or she talks. Gets very short of breath with any exertion/movement. Denies chest pain. Reports worsening lower extremity edema.  Objective:    Vitals:   07/31/16 0416 07/31/16 0606 07/31/16 0648 07/31/16 0700  BP:  (!) 100/30 (!) 127/48 124/78  Pulse:  85 84 77  Resp:   20 19  Temp:   98.2 F (36.8 C) 97.5 F (36.4 C)  TempSrc:   Oral Oral  SpO2: 94% 93% 95% 93%  Weight:   128.4 kg (283 lb 1.1 oz)   Height:   5' 4.8" (1.646 m)     Intake/Output Summary (Last 24 hours) at 07/31/16 0812 Last data filed at 07/31/16 0445  Gross per 24 hour  Intake                0 ml  Output              700 ml  Net             -700 ml   Filed Weights   07/30/16 2202 07/31/16 0329 07/31/16 0648  Weight: 128.4 kg (283 lb 1.1 oz) 128.4 kg (283 lb 1.1 oz) 128.4 kg (283 lb 1.1 oz)    Exam: General exam: Appears Anxious/restless. Respiratory system: Breath sounds diminished with  left-sided crackles. Respiratory effort mildly labored at rest. Cardiovascular system: S1 & S2 heard, RRR. No JVD,  rubs, gallops or clicks. No murmurs. Gastrointestinal system: Abdomen is nondistended, soft and nontender. No organomegaly or masses felt. Normal bowel sounds heard. Central nervous system: Alert and oriented. No focal neurological deficits. Extremities: As pictured below. Skin: Scattered ecchymosis and significant lower extremity venous stasis dermatitis. Psychiatry: Judgement and insight appear normal. Mood & affect anxious.      Data Reviewed:   I have personally reviewed following labs and imaging studies:  Labs: Basic  Metabolic Panel:  Recent Labs Lab 07/30/16 1602 07/30/16 1622  NA 140 135  K 3.9 3.8  CL 89* 99*  CO2 41*  --   GLUCOSE 90 95  BUN 12 8  CREATININE 0.89 0.90  CALCIUM 9.8  --    GFR Estimated Creatinine Clearance: 76.1 mL/min (by C-G formula based on SCr of 0.9 mg/dL). Liver Function Tests:  Recent Labs Lab 07/30/16 2158  AST 13*  ALT 12*  ALKPHOS 53  BILITOT 0.4  PROT 6.7  ALBUMIN 3.1*   No results for input(s): LIPASE, AMYLASE in the last 168 hours. No results for input(s): AMMONIA in the last 168 hours. Coagulation profile No results for input(s): INR, PROTIME in the last 168 hours.  CBC:  Recent Labs Lab 07/30/16 1602 07/30/16 1622  WBC 4.2  --   NEUTROABS 2.6  --   HGB 10.9* 13.9  HCT 37.9 41.0  MCV 106.8*  --   PLT 159  --    Cardiac Enzymes:  Recent Labs Lab 07/30/16 2158 07/31/16 0153  TROPONINI <0.03 <0.03   BNP (last 3 results) No results for input(s): PROBNP in the last 8760 hours. CBG: No results for input(s): GLUCAP in the last 168 hours. D-Dimer: No results for input(s): DDIMER in the last 72 hours. Hgb A1c: No results for input(s): HGBA1C in the last 72 hours. Lipid Profile: No results for input(s): CHOL, HDL, LDLCALC, TRIG, CHOLHDL, LDLDIRECT in the last 72 hours. Thyroid function studies:  Recent Labs  07/30/16 2200  TSH 0.393   Anemia work up: No results for input(s): VITAMINB12, FOLATE, FERRITIN, TIBC, IRON, RETICCTPCT in the last 72 hours. Sepsis Labs:  Recent Labs Lab 07/30/16 1602  WBC 4.2    Microbiology No results found for this or any previous visit (from the past 240 hour(s)).  Radiology: Dg Chest 2 View  Result Date: 07/30/2016 CLINICAL DATA:  Dyspnea. History of CHF and COPD. Lower extremity swelling. Abdominal distension. EXAM: CHEST  2 VIEW COMPARISON:  05/03/2015 chest radiograph. FINDINGS: Stable cardiomediastinal silhouette with mild cardiomegaly and aortic atherosclerosis. No pneumothorax. No  pleural effusion. Borderline mild pulmonary edema. Linear opacities overlying the lower lobes on the lateral view. IMPRESSION: 1. Borderline mild congestive heart failure. 2. Linear opacities overlying the lower lobes on the lateral view, favor atelectasis. 3. Aortic atherosclerosis. Electronically Signed   By: Ilona Sorrel M.D.   On: 07/30/2016 17:23    Medications:   . carvedilol  6.25 mg Oral BID WC  . cholestyramine  4 g Oral BID  . enoxaparin (LOVENOX) injection  40 mg Subcutaneous Q24H  . furosemide  80 mg Intravenous BID  . ipratropium-albuterol  3 mL Nebulization Q6H  . levothyroxine  300 mcg Oral QAC breakfast  . pravastatin  20 mg Oral QHS  . saccharomyces boulardii  250 mg Oral BID  . sodium chloride flush  3 mL Intravenous Q12H  . sulfaSALAzine  2,000 mg  Oral BID   Continuous Infusions:  Medical decision making is of high complexity and this patient is at high risk of deterioration, therefore this is a level 3 visit.  (> 4 problem points, >4 data points, high risk)    LOS: 1 day   Burdell Peed  [facility] Pager [phone]. If unable to reach me by pager, please call my cell phone at [phone].  *Please refer to [email or web address], password [email or web address] to get updated schedule on who will round on this patient, as hospitalists switch teams weekly. If 7PM-7AM, please contact night-coverage at [email or web address], password [email or web address] for any overnight needs.  07/31/2016, 8:12 AM

## 2016-07-31 NOTE — Progress Notes (Signed)
Orthopedic Tech Progress Note Patient Details:  Jamie Montoya 1945/08/24 [id number]  Ortho Devices Type of Ortho Device: Louretta Parma boot Ortho Device/Splint Location: bilateral Ortho Device/Splint Interventions: Application   Hildred Priest 07/31/2016, 12:22 PM

## 2016-07-31 NOTE — Progress Notes (Signed)
Echocardiogram 2D Echocardiogram has been performed.  Aggie Cosier 07/31/2016, 4:43 PM

## 2016-07-31 NOTE — Progress Notes (Signed)
Critical lab called to RRT and NP Baltazar Najjar

## 2016-07-31 NOTE — Consult Note (Signed)
[place] Nurse wound consult note Reason for Consult: Consult requested for bilat legs.  Pt states she was wearing bilat Una boots prior to admission and they were changed 2X week by home health.  She requests that they be changed Q day while she is in the hospital.  Attempted to inform her it is not appropriate usage for this type of compression therapy to be changed daily and we can arrange for the ortho tech to change every other day; she became upset and said "just do whatever you want, you are not listening to me."  Wound type: Bilat legs with generalized edema and erythremia and skin changes consistent with chronic venous stasis changes. No open wounds or drainage, dry peeling scaly skin; patchy areas of red raised lesions. Left leg has a deep crease which patient states was the result of an injury many years ago. It is high risk for a wound to occur in this site if prolonged moisture occurs in the fold.  Washed legs with soap and water, towel dried, and applied lotion.  Ortho tech called to reapply bilat Una boots and coban and change Q Wed/Fri/Sun. Please re-consult if further assistance is needed.  Thank-you,  Julien Girt MSN, [place], [place], [place], [place]

## 2016-07-31 NOTE — Progress Notes (Signed)
Np called back. Said to continue to monitor. No new orders at this time.

## 2016-07-31 NOTE — Progress Notes (Signed)
MD notified RRT call. Ordered lasix. Given. MD on call came in to access.

## 2016-07-31 NOTE — Progress Notes (Signed)
Attempted to call report but nurse needed 5 minutes. Will call back.

## 2016-07-31 NOTE — Progress Notes (Signed)
RN paged because pt was desatting into the 70s with removal of mask and sometimes on 3L. O2 bumped by RT to 4 and then to 12L per mask. Neb tx given.  NP ordered 60m additional Lasix as pt admitted with overload. RT drawing ABG. NP to bedside. S: pt admits to SOB but appears comfortable. Pt says she has never been tested for sleep apnea. Sleeps in a recliner at home due to breathing. Is usually on 2.5L O2 at home which was self increased to 3L when became SOB, then pt came to ED.  O: Pt awakens with name calling. Is oriented x 3. Able to provide hx. O2 sat 94% on 12 L. Pt's RR is normal without use of accessory muscles.  A/P: 1. CHF with overload-continue Lasix.  2. Hx COPD-wean O2 when able with goal O2 sat 89% or over.  Await results of ABG but pt is not in distress at this time.  Low threshold to move if worsens. KJKG, NP [facility]

## 2016-07-31 NOTE — Progress Notes (Signed)
Oxygen dropping to low 70's. Breathing treatment given and Respiratory notified.

## 2016-07-31 NOTE — Progress Notes (Signed)
RN called to inform me pt would be moved to a SDU after ABG completed per Baltazar Najjar NP as Baltazar Najjar NP was at bedside to see pt. RN reports sats dropped to 77% due to pt removing mask and [place]. RT at bedside gave breathing tx increased O2 to 4L [place] then 12 L on a face mask. SpO2 94% on 12 L. Transfer to SDU order placed. Waiting for ABG prior to moving pt. Additional Lasix 40 mg IVP given and cxr obtained. No interventions from this RN.

## 2016-08-01 DIAGNOSIS — E876 Hypokalemia: Secondary | ICD-10-CM

## 2016-08-01 LAB — BASIC METABOLIC PANEL
Anion gap: 9 (ref 5–15)
BUN: 10 mg/dL (ref 6–20)
CALCIUM: 8.9 mg/dL (ref 8.9–10.3)
CHLORIDE: 86 mmol/L — AB (ref 101–111)
CO2: 42 mmol/L — ABNORMAL HIGH (ref 22–32)
CREATININE: 0.91 mg/dL (ref 0.44–1.00)
GFR calc non Af Amer: >60 mL/min (ref >60)
Glucose, Bld: 92 mg/dL (ref 65–99)
Potassium: 3.2 mmol/L — ABNORMAL LOW (ref 3.5–5.1)
SODIUM: 137 mmol/L (ref 135–145)

## 2016-08-01 MED ORDER — POTASSIUM CHLORIDE CRYS ER 20 MEQ PO TBCR
20.0000 meq | EXTENDED_RELEASE_TABLET | Freq: Two times a day (BID) | ORAL | Status: DC
  Administered 2016-08-01 – 2016-08-02 (×2): 20 meq via ORAL
  Filled 2016-08-01 (×3): qty 1

## 2016-08-01 MED ORDER — IPRATROPIUM-ALBUTEROL 0.5-2.5 (3) MG/3ML IN SOLN
3.0000 mL | Freq: Three times a day (TID) | RESPIRATORY_TRACT | Status: DC
  Administered 2016-08-01 – 2016-08-05 (×12): 3 mL via RESPIRATORY_TRACT
  Filled 2016-08-01 (×13): qty 3

## 2016-08-01 MED ORDER — METOLAZONE 5 MG PO TABS
5.0000 mg | ORAL_TABLET | Freq: Every day | ORAL | Status: DC
  Administered 2016-08-01 – 2016-08-05 (×5): 5 mg via ORAL
  Filled 2016-08-01 (×5): qty 1

## 2016-08-01 MED FILL — Perflutren Lipid Microsphere IV Susp 1.1 MG/ML: INTRAVENOUS | Qty: 10 | Status: AC

## 2016-08-01 NOTE — Consult Note (Signed)
            [facility] CM Primary Care Navigator  08/01/2016  Jamie Montoya March 02, 1946 [id number]   Went to see patientat the bedside to identify possible discharge needsbut staff reports that patient was transferred to 4E 01 due reported SVT episodes and patient's report of "feeling heart flutter".   Will attempt to meet with patient at another time, when available and transferred out of stepdown unit.  For additional questions please contact:  Edwena Felty A. Horton Ellithorpe, BSN, RN-BC [facility] PRIMARY CARE Navigator Cell: [phone]

## 2016-08-01 NOTE — Progress Notes (Signed)
Patient complained of tightness and swelling in RLE at her calf line from unna boot that was applied today. She requested that they be loosened, to which I complied because it has been noted that the patient is at a high risk for impaired skin integrity due to superimposed cellulitis, an extra piece of ACE wrap has been applied to top of unna boot losely to keep the unna boot secured without compromising circulation. Patient's RLE warm and pink. Patient's BLE have been elevated with pillow and I will pass this information along to day shift nurse and continue to monitor patient for any additional swelling. Lenna Sciara, RN 08/01/2016 5:03 AM

## 2016-08-01 NOTE — Progress Notes (Signed)
Nutrition Consult  RD consulted for nutrition education regarding weight loss.  Body mass index is 46.97 kg/m. Pt meets criteria for class 3, extreme/morbid obesity based on current BMI.  RD provided "Weight Loss Tips" handout from the Academy of [employer] and Dietetics. Emphasized the importance of serving sizes and provided examples of correct portions of common foods. Discussed importance of controlled and consistent intake throughout the day. Provided examples of ways to balance meals/snacks and encouraged intake of high-fiber, whole grain complex carbohydrates. Emphasized the importance of hydration with calorie-free beverages and limiting sugar-sweetened beverages. Encouraged pt to discuss physical activity options with physician.  Also discussed low sodium recommendations and ways to decrease sodium in her diet.  Teach back method used.  Expect fair compliance.  Current diet order is Renal, patient is consuming approximately 50-75% of meals at this time. Labs and medications reviewed. No further nutrition interventions warranted at this time. RD contact information provided. If additional nutrition issues arise, please re-consult RD.   Recommend changing diet to low sodium CHO modified with fluid restriction, does not need renal restrictions (potassium WNL, phosphorus level unavailable).  Molli Barrows, RD, LDN, [place] Pager [phone] After Hours Pager [phone]

## 2016-08-01 NOTE — Progress Notes (Signed)
Patient has had episodes on SVT lasting as long as 5-30 seconds and as high 144bpm, during last episode patient was symptomatic and reported "feeling her heart flutter". Patient's HR returns to NSR; BBB after episodes. MD texted paged. Will continue to monitor patient for any additional episodes or symptoms.

## 2016-08-01 NOTE — Progress Notes (Signed)
Patient had a 7 beat run of V-Tach via [facility]. MD texted paged. Patient asymptomatic. Will continue to monitor patient.

## 2016-08-01 NOTE — Progress Notes (Addendum)
Progress Note    Jamie Montoya  GMW:[id number] DOB: [birth year]  DOA: 07/30/2016 PCP: Leonides Sake, MD    Brief Narrative:   Chief complaint: Follow-up dyspnea/hypoxia.  Jamie Montoya is an 71 y.o. female PMH of CHF (no prior 2-D echoes on record), ulcerative colitis, esophageal stenosis, obesity, chronic respiratory failure secondary COPD on 2.5 L of home oxygen, hypothyroidism and anxiety disorder was admitted 07/30/16 for evaluation of worsening dyspnea and weight gain. BNP was 8522 on admission. She was noted to be hypoxic with oxygen saturation of 76% on 2 L. Chest x-ray showed mild vascular congestion/atelectasis.  Assessment/Plan:   Principal Problem:   Acute on chronic respiratory failure with hypoxia (HCC) secondary to acute on chronic diastolic CHF exacerbation  Patients worsening respiratory failure appears to be from a CHF exacerbation given her marked elevation of BNP and findings as noted on chest x-ray (personally reviewed). Troponin negative 3. On Coreg already.2-D echo showed EF 60-65 percent, no regional wall motion abnormalities, and grade 2 diastolic dysfunction. Will push diuretic therapy to achieve better diuresis. Add Zaroxolyn.The patient continues to have an increased oxygen requirement of up to 6 L to maintain oxygen saturations. The patient continues to have an increased oxygen requirement, up to 6 L.    Active Problems:   Chronic venous stasis dermatitis Evaluated by wound care nurse with Unna boots to be applied and changed every other day. Monitor closely for superimposed cellulitis as she is at high risk given impaired skin integrity.    OBESITY, MORBID Body mass index is 47.4 kg/m. Dietitian consultation for diet education.    COPD (chronic obstructive pulmonary disease) (HCC) Continue supplemental oxygen. Continue bronchodilators.    Ulcerative colitis, chronic (HCC) Continue sulfasalazine.    HLD (hyperlipidemia) Continue Pravachol.   Hypothyroidism Continue Synthroid.    Anxiety Continue Ativan as needed.    Hypokalemia Secondary to diuretics. Will add routine supplementation.   Family Communication/Anticipated D/C date and plan/Code Status   DVT prophylaxis: Lovenox ordered. Code Status: Full Code.  Family Communication: No family currently at the bedside. Disposition Plan: Likely will need another 24-48 hours in the hospital.   Medical Consultants:    None.   Procedures:    None  Anti-Infectives:    None  Subjective:   Continues to be short of breath, and her oxygen requirement has gone up. Sitting up in the chair and becomes breathless with conversation. Continues to have significant lower extremity edema.  Objective:    Vitals:   08/01/16 0018 08/01/16 0024 08/01/16 0405 08/01/16 0749  BP: (!) 119/44 (!) 119/44 (!) 111/47   Pulse: 85 79 66   Resp: 18 14 11    Temp: 98.7 F (37.1 C)  97.3 F (36.3 C)   TempSrc: Oral  Oral   SpO2: 96% 94% 93% 94%  Weight:   127.2 kg (280 lb 8 oz)   Height:        Intake/Output Summary (Last 24 hours) at 08/01/16 0824 Last data filed at 08/01/16 0329  Gross per 24 hour  Intake              720 ml  Output             1400 ml  Net             -680 ml   Filed Weights   07/31/16 0329 07/31/16 0648 08/01/16 0405  Weight: 128.4 kg (283 lb 1.1 oz) 128.4 kg (283 lb 1.1 oz)  127.2 kg (280 lb 8 oz)    Exam: General exam: Appears Anxious/restless. Respiratory system: Breath sounds diminished with left-sided crackles. Respiratory effort mildly labored at rest. Cardiovascular system: S1 & S2 heard, RRR. No JVD,  rubs, gallops or clicks. No murmurs. Gastrointestinal system: Abdomen is nondistended, soft and nontender. No organomegaly or masses felt. Normal bowel sounds heard. Central nervous system: Alert and oriented. No focal neurological deficits. Extremities: As pictured below.Currently re-wrapped in [employer]. Skin: Scattered ecchymosis and  significant lower extremity venous stasis dermatitis. Psychiatry: Judgement and insight appear normal. Mood & affect anxious.      Data Reviewed:   I have personally reviewed following labs and imaging studies:  Labs: Basic Metabolic Panel:  Recent Labs Lab 07/30/16 1602 07/30/16 1622 07/31/16 0919 08/01/16 0252  NA 140 135 139 137  K 3.9 3.8 3.6 3.2*  CL 89* 99* 89* 86*  CO2 41*  --  40* 42*  GLUCOSE 90 95 92 92  BUN 12 8 11 10   CREATININE 0.89 0.90 0.97 0.91  CALCIUM 9.8  --  9.2 8.9   GFR Estimated Creatinine Clearance: 74.8 mL/min (by C-G formula based on SCr of 0.91 mg/dL). Liver Function Tests:  Recent Labs Lab 07/30/16 2158  AST 13*  ALT 12*  ALKPHOS 53  BILITOT 0.4  PROT 6.7  ALBUMIN 3.1*   No results for input(s): LIPASE, AMYLASE in the last 168 hours. No results for input(s): AMMONIA in the last 168 hours. Coagulation profile No results for input(s): INR, PROTIME in the last 168 hours.  CBC:  Recent Labs Lab 07/30/16 1602 07/30/16 1622  WBC 4.2  --   NEUTROABS 2.6  --   HGB 10.9* 13.9  HCT 37.9 41.0  MCV 106.8*  --   PLT 159  --    Cardiac Enzymes:  Recent Labs Lab 07/30/16 2158 07/31/16 0153 07/31/16 0919  TROPONINI <0.03 <0.03 <0.03   BNP (last 3 results) No results for input(s): PROBNP in the last 8760 hours. CBG: No results for input(s): GLUCAP in the last 168 hours. D-Dimer: No results for input(s): DDIMER in the last 72 hours. Hgb A1c: No results for input(s): HGBA1C in the last 72 hours. Lipid Profile: No results for input(s): CHOL, HDL, LDLCALC, TRIG, CHOLHDL, LDLDIRECT in the last 72 hours. Thyroid function studies:  Recent Labs  07/30/16 2200  TSH 0.393   Anemia work up: No results for input(s): VITAMINB12, FOLATE, FERRITIN, TIBC, IRON, RETICCTPCT in the last 72 hours. Sepsis Labs:  Recent Labs Lab 07/30/16 1602  WBC 4.2    Microbiology Recent Results (from the past 240 hour(s))  MRSA PCR Screening      Status: None   Collection Time: 07/31/16  6:57 AM  Result Value Ref Range Status   MRSA by PCR NEGATIVE NEGATIVE Final    Comment:        The GeneXpert MRSA Assay (FDA approved for NASAL specimens only), is one component of a comprehensive MRSA colonization surveillance program. It is not intended to diagnose MRSA infection nor to guide or monitor treatment for MRSA infections.     Radiology: Dg Chest 2 View  Result Date: 07/30/2016 CLINICAL DATA:  Dyspnea. History of CHF and COPD. Lower extremity swelling. Abdominal distension. EXAM: CHEST  2 VIEW COMPARISON:  05/03/2015 chest radiograph. FINDINGS: Stable cardiomediastinal silhouette with mild cardiomegaly and aortic atherosclerosis. No pneumothorax. No pleural effusion. Borderline mild pulmonary edema. Linear opacities overlying the lower lobes on the lateral view. IMPRESSION: 1. Borderline mild  congestive heart failure. 2. Linear opacities overlying the lower lobes on the lateral view, favor atelectasis. 3. Aortic atherosclerosis. Electronically Signed   By: Ilona Sorrel M.D.   On: 07/30/2016 17:23    Medications:   . carvedilol  6.25 mg Oral BID WC  . cholestyramine  4 g Oral BID  . enoxaparin (LOVENOX) injection  40 mg Subcutaneous Q24H  . furosemide  80 mg Intravenous BID  . ipratropium-albuterol  3 mL Nebulization TID  . levothyroxine  300 mcg Oral QAC breakfast  . pravastatin  20 mg Oral QHS  . saccharomyces boulardii  250 mg Oral BID  . sodium chloride flush  3 mL Intravenous Q12H  . sulfaSALAzine  2,000 mg Oral BID   Continuous Infusions:  Medical decision making is of high complexity and this patient is at high risk of deterioration, therefore this is a level 3 visit.  (> 4 problem points, 2 data points, high risk)    LOS: 2 days   Preeti Winegardner  [facility] Pager [phone]. If unable to reach me by pager, please call my cell phone at [phone].  *Please refer to [email or web address], password [email or web address] to get updated  schedule on who will round on this patient, as hospitalists switch teams weekly. If 7PM-7AM, please contact night-coverage at [email or web address], password [email or web address] for any overnight needs.  08/01/2016, 8:24 AM

## 2016-08-02 DIAGNOSIS — I5033 Acute on chronic diastolic (congestive) heart failure: Principal | ICD-10-CM

## 2016-08-02 LAB — BASIC METABOLIC PANEL
ANION GAP: 9 (ref 5–15)
BUN: 11 mg/dL (ref 6–20)
CALCIUM: 9.2 mg/dL (ref 8.9–10.3)
CO2: 43 mmol/L — AB (ref 22–32)
CREATININE: 0.97 mg/dL (ref 0.44–1.00)
Chloride: 84 mmol/L — ABNORMAL LOW (ref 101–111)
GFR, EST NON AFRICAN AMERICAN: 57 mL/min — AB (ref >60)
Glucose, Bld: 97 mg/dL (ref 65–99)
Potassium: 3 mmol/L — ABNORMAL LOW (ref 3.5–5.1)
SODIUM: 136 mmol/L (ref 135–145)

## 2016-08-02 LAB — MAGNESIUM: MAGNESIUM: 1.9 mg/dL (ref 1.7–2.4)

## 2016-08-02 MED ORDER — POTASSIUM CHLORIDE CRYS ER 20 MEQ PO TBCR: 20.0000 meq | EXTENDED_RELEASE_TABLET | Freq: Two times a day (BID) | ORAL | Status: DC

## 2016-08-02 MED ORDER — FUROSEMIDE 10 MG/ML IJ SOLN
80.0000 mg | Freq: Three times a day (TID) | INTRAMUSCULAR | Status: DC
  Administered 2016-08-02 – 2016-08-03 (×3): 80 mg via INTRAVENOUS
  Filled 2016-08-02 (×3): qty 8

## 2016-08-02 MED ORDER — POTASSIUM CHLORIDE CRYS ER 20 MEQ PO TBCR
40.0000 meq | EXTENDED_RELEASE_TABLET | Freq: Once | ORAL | Status: AC
  Administered 2016-08-02: 40 meq via ORAL
  Filled 2016-08-02: qty 2

## 2016-08-02 MED ORDER — POTASSIUM CHLORIDE CRYS ER 20 MEQ PO TBCR
40.0000 meq | EXTENDED_RELEASE_TABLET | Freq: Two times a day (BID) | ORAL | Status: DC
  Administered 2016-08-02: 40 meq via ORAL
  Filled 2016-08-02: qty 2

## 2016-08-02 NOTE — Progress Notes (Signed)
Orthopedic Tech Progress Note Patient Details:  Jamie Montoya [birth year] [id number]  Ortho Devices Type of Ortho Device: Haematologist Ortho Device/Splint Location: (B) LE  Ortho Device/Splint Interventions: Ordered, Application   Braulio Bosch 08/02/2016, 7:09 PM

## 2016-08-02 NOTE — Progress Notes (Signed)
Progress Note    Jamie Montoya  ZOX:[id number] DOB: May 07, 1946  DOA: 07/30/2016 PCP: Leonides Sake, MD    Brief Narrative:   Chief complaint: Follow-up dyspnea/hypoxia.  Jamie Montoya is an 71 y.o. female PMH of CHF (no prior 2-D echoes on record), ulcerative colitis, esophageal stenosis, obesity, chronic respiratory failure secondary COPD on 2.5 L of home oxygen, hypothyroidism and anxiety disorder was admitted 07/30/16 for evaluation of worsening dyspnea and weight gain. BNP was 8522 on admission. She was noted to be hypoxic with oxygen saturation of 76% on 2 L. Chest x-ray showed mild vascular congestion/atelectasis.  Assessment/Plan:   Principal Problem:   Acute on chronic respiratory failure with hypoxia (HCC) secondary to acute on chronic diastolic CHF exacerbation  Patients worsening respiratory failure appears to be from a CHF exacerbation given her marked elevation of BNP and findings as noted on chest x-ray (personally reviewed). Troponin negative 3. On Coreg already.2-D echo showed EF 60-65 percent, no regional wall motion abnormalities, and grade 2 diastolic dysfunction. Will push diuretic therapy to achieve better diuresis. Zaroxolyn added 08/01/16. Will increase Lasix to 80 mg 3 times a day.The patient continues to have an increased oxygen requirement of up to 6 L to maintain oxygen saturations.     Active Problems:   Nonsustained ventricular tachycardia Likely from electrolyte imbalances with diuresis. Check magnesium. Increase routine potassium supplementation.    Chronic venous stasis dermatitis Evaluated by wound care nurse with Unna boots to be applied and changed every other day. Monitor closely for superimposed cellulitis as she is at high risk given impaired skin integrity.    OBESITY, MORBID Body mass index is 47.4 kg/m. Dietitian consultation for diet education.    COPD (chronic obstructive pulmonary disease) (HCC) Continue supplemental oxygen. Continue  bronchodilators.    Ulcerative colitis, chronic (HCC) Continue sulfasalazine.    HLD (hyperlipidemia) Continue Pravachol.    Hypothyroidism Continue Synthroid.    Anxiety Continue Ativan as needed.    Hypokalemia Secondary to diuretics. Will add routine supplementation.   Family Communication/Anticipated D/C date and plan/Code Status   DVT prophylaxis: Lovenox ordered. Code Status: Full Code.  Family Communication: No family currently at the bedside. Disposition Plan: Likely will need another 24-48 hours in the hospital.   Medical Consultants:    None.   Procedures:    None  Anti-Infectives:    None  Subjective:   Continues to be short of breath, With her oxygen currently at 4 L. Continues to report significant tightness in her legs with swelling. Reports that she has been having loose stools.  Objective:    Vitals:   08/02/16 0400 08/02/16 0701 08/02/16 0831 08/02/16 1200  BP: (!) 133/53 (!) 116/46  (!) 137/54  Pulse: 74 68  76  Resp: 17 17    Temp: 97.7 F (36.5 C) 97.6 F (36.4 C)    TempSrc: Oral Oral    SpO2: 95% 94% 97% 94%  Weight:      Height:        Intake/Output Summary (Last 24 hours) at 08/02/16 1316 Last data filed at 08/02/16 0400  Gross per 24 hour  Intake              360 ml  Output             1450 ml  Net            -1090 ml   Filed Weights   07/31/16 0329 07/31/16 8119 08/01/16 0405  Weight: 128.4 kg (283 lb 1.1 oz) 128.4 kg (283 lb 1.1 oz) 127.2 kg (280 lb 8 oz)    Exam: General exam: Sitting up in chair with mild breathlessness. Respiratory system: Breath sounds diminished with a few bibasilar crackles. Respiratory effort mildly labored at rest. Cardiovascular system: S1 & S2 heard, RRR. No JVD,  rubs, gallops or clicks. No murmurs. Gastrointestinal system: Abdomen is nondistended, soft and nontender. No organomegaly or masses felt. Normal bowel sounds heard. Central nervous system: Alert and oriented. No focal  neurological deficits. Extremities: As pictured below.Currently re-wrapped in [employer]. Skin: Scattered ecchymosis and significant lower extremity venous stasis dermatitis. Psychiatry: Judgement and insight appear normal. Mood & affect anxious.      Data Reviewed:   I have personally reviewed following labs and imaging studies:  Labs: Basic Metabolic Panel:  Recent Labs Lab 07/30/16 1602 07/30/16 1622 07/31/16 0919 08/01/16 0252 08/02/16 0409  NA 140 135 139 137 136  K 3.9 3.8 3.6 3.2* 3.0*  CL 89* 99* 89* 86* 84*  CO2 41*  --  40* 42* 43*  GLUCOSE 90 95 92 92 97  BUN 12 8 11 10 11   CREATININE 0.89 0.90 0.97 0.91 0.97  CALCIUM 9.8  --  9.2 8.9 9.2  MG  --   --   --   --  1.9   GFR Estimated Creatinine Clearance: 70.2 mL/min (by C-G formula based on SCr of 0.97 mg/dL). Liver Function Tests:  Recent Labs Lab 07/30/16 2158  AST 13*  ALT 12*  ALKPHOS 53  BILITOT 0.4  PROT 6.7  ALBUMIN 3.1*   No results for input(s): LIPASE, AMYLASE in the last 168 hours. No results for input(s): AMMONIA in the last 168 hours. Coagulation profile No results for input(s): INR, PROTIME in the last 168 hours.  CBC:  Recent Labs Lab 07/30/16 1602 07/30/16 1622  WBC 4.2  --   NEUTROABS 2.6  --   HGB 10.9* 13.9  HCT 37.9 41.0  MCV 106.8*  --   PLT 159  --    Cardiac Enzymes:  Recent Labs Lab 07/30/16 2158 07/31/16 0153 07/31/16 0919  TROPONINI <0.03 <0.03 <0.03   BNP (last 3 results) No results for input(s): PROBNP in the last 8760 hours. CBG: No results for input(s): GLUCAP in the last 168 hours. D-Dimer: No results for input(s): DDIMER in the last 72 hours. Hgb A1c: No results for input(s): HGBA1C in the last 72 hours. Lipid Profile: No results for input(s): CHOL, HDL, LDLCALC, TRIG, CHOLHDL, LDLDIRECT in the last 72 hours. Thyroid function studies:  Recent Labs  07/30/16 2200  TSH 0.393   Anemia work up: No results for input(s): VITAMINB12, FOLATE,  FERRITIN, TIBC, IRON, RETICCTPCT in the last 72 hours. Sepsis Labs:  Recent Labs Lab 07/30/16 1602  WBC 4.2    Microbiology Recent Results (from the past 240 hour(s))  MRSA PCR Screening     Status: None   Collection Time: 07/31/16  6:57 AM  Result Value Ref Range Status   MRSA by PCR NEGATIVE NEGATIVE Final    Comment:        The GeneXpert MRSA Assay (FDA approved for NASAL specimens only), is one component of a comprehensive MRSA colonization surveillance program. It is not intended to diagnose MRSA infection nor to guide or monitor treatment for MRSA infections.     Radiology: No results found.  Medications:   . carvedilol  6.25 mg Oral BID WC  . cholestyramine  4  g Oral BID  . enoxaparin (LOVENOX) injection  40 mg Subcutaneous Q24H  . furosemide  80 mg Intravenous TID  . ipratropium-albuterol  3 mL Nebulization TID  . levothyroxine  300 mcg Oral QAC breakfast  . metolazone  5 mg Oral Daily  . potassium chloride  40 mEq Oral BID  . pravastatin  20 mg Oral QHS  . saccharomyces boulardii  250 mg Oral BID  . sodium chloride flush  3 mL Intravenous Q12H  . sulfaSALAzine  2,000 mg Oral BID   Continuous Infusions:  Medical decision making is of high complexity and this patient is at high risk of deterioration, therefore this is a level 3 visit.  (> 4 problem points, 2 data points, high risk)    LOS: 3 days   Symon Norwood  [facility] Pager [phone]. If unable to reach me by pager, please call my cell phone at [phone].  *Please refer to [email or web address], password [email or web address] to get updated schedule on who will round on this patient, as hospitalists switch teams weekly. If 7PM-7AM, please contact night-coverage at [email or web address], password [email or web address] for any overnight needs.  08/02/2016, 1:16 PM

## 2016-08-02 NOTE — Care Management Important Message (Signed)
Important Message  Patient Details  Name: Jamie Montoya MRN: [id number] Date of Birth: [birth year]   Medicare Important Message Given:  Yes    Nathen May 08/02/2016, 2:02 PM

## 2016-08-03 LAB — BASIC METABOLIC PANEL
Anion gap: 12 (ref 5–15)
BUN: 16 mg/dL (ref 6–20)
CALCIUM: 9.1 mg/dL (ref 8.9–10.3)
CO2: 41 mmol/L — AB (ref 22–32)
Chloride: 82 mmol/L — ABNORMAL LOW (ref 101–111)
Creatinine, Ser: 1.07 mg/dL — ABNORMAL HIGH (ref 0.44–1.00)
GFR calc Af Amer: 59 mL/min — ABNORMAL LOW (ref >60)
GFR, EST NON AFRICAN AMERICAN: 51 mL/min — AB (ref >60)
GLUCOSE: 99 mg/dL (ref 65–99)
Potassium: 3.5 mmol/L (ref 3.5–5.1)
Sodium: 135 mmol/L (ref 135–145)

## 2016-08-03 MED ORDER — POTASSIUM CHLORIDE CRYS ER 20 MEQ PO TBCR
40.0000 meq | EXTENDED_RELEASE_TABLET | Freq: Three times a day (TID) | ORAL | Status: DC
  Administered 2016-08-03 – 2016-08-04 (×6): 40 meq via ORAL
  Filled 2016-08-03 (×6): qty 2

## 2016-08-03 MED ORDER — CHOLESTYRAMINE 4 G PO PACK
4.0000 g | PACK | Freq: Two times a day (BID) | ORAL | Status: DC
  Administered 2016-08-03 – 2016-08-04 (×4): 4 g via ORAL
  Filled 2016-08-03 (×6): qty 1

## 2016-08-03 MED ORDER — FUROSEMIDE 10 MG/ML IJ SOLN
80.0000 mg | Freq: Two times a day (BID) | INTRAMUSCULAR | Status: DC
  Administered 2016-08-03 – 2016-08-04 (×3): 80 mg via INTRAVENOUS
  Filled 2016-08-03 (×3): qty 8

## 2016-08-03 NOTE — Progress Notes (Signed)
Patient to transfer to 5N04 report given to receiving nurse, all questions answered at this time.  Pt. VSS with no s/s of distress noted.  Patient stable at transfer.  Pt. Has a billing question with regards to the room bill since she did not stay the entire day in 4E01 message left with case management receiving nurse to follow up.

## 2016-08-03 NOTE — Progress Notes (Signed)
Progress Note    Jamie Montoya  TJQ:[id number] DOB: 03-28-1946  DOA: 07/30/2016 PCP: Leonides Sake, MD    Brief Narrative:   Chief complaint: Follow-up dyspnea/hypoxia.  Jamie Montoya is an 71 y.o. female PMH of CHF (no prior 2-D echoes on record), ulcerative colitis, esophageal stenosis, obesity, chronic respiratory failure secondary COPD on 2.5 L of home oxygen, hypothyroidism and anxiety disorder was admitted 07/30/16 for evaluation of worsening dyspnea and weight gain. BNP was 8522 on admission. She was noted to be hypoxic with oxygen saturation of 76% on 2 L. Chest x-ray showed mild vascular congestion/atelectasis.  Assessment/Plan:   Principal Problem:   Acute on chronic respiratory failure with hypoxia (HCC) secondary to acute on chronic diastolic CHF exacerbation  Patients worsening respiratory failure appears to be from a CHF exacerbation given her marked elevation of BNP and findings as noted on chest x-ray (personally reviewed). Troponin negative 3. On Coreg already.2-D echo showed EF 60-65 percent, no regional wall motion abnormalities, and grade 2 diastolic dysfunction. Continue Lasix 80 mg IV 3 times a day and Zaroxolyn, I/O balance negative 2 L. Weight down about 8 pounds.Oxygen saturations improved and oxygen requirement back down to 2-3 liters/min via [place]. Creatinine beginning to rise, so will reduce Lasix to twice a day dosing.    Active Problems:   Nonsustained ventricular tachycardia Likely from electrolyte imbalances with diuresis. Magnesium okay. Potassium 3.5. We'll increase potassium supplementation.    Chronic venous stasis dermatitis Evaluated by wound care nurse with Unna boots to be applied and changed every other day. Monitor closely for superimposed cellulitis as she is at high risk given impaired skin integrity.    OBESITY, MORBID Body mass index is 47.4 kg/m. Dietitian consultation for diet education.    COPD (chronic obstructive pulmonary disease)  (HCC) Continue supplemental oxygen. Continue bronchodilators.    Ulcerative colitis, chronic (HCC) Continue sulfasalazine.    HLD (hyperlipidemia) Continue Pravachol.    Hypothyroidism Continue Synthroid.    Anxiety Continue Ativan as needed.    Hypokalemia Secondary to diuretics. Will add routine supplementation.   Family Communication/Anticipated D/C date and plan/Code Status   DVT prophylaxis: Lovenox ordered. Code Status: Full Code.  Family Communication: No family currently at the bedside. Disposition Plan: Likely will need another 24-48 hours in the hospital.   Medical Consultants:    None.   Procedures:    None  Anti-Infectives:    None  Subjective:   Feels less short of breath. Now wants to shower. Oxygen has been weaned to 2 L. Legs are little less swollen.  Objective:    Vitals:   08/03/16 0256 08/03/16 0716 08/03/16 0728 08/03/16 0737  BP: (!) 114/49   101/60  Pulse: 74   89  Resp: (!) 26   (!) 21  Temp: 97.9 F (36.6 C)   97.7 F (36.5 C)  TempSrc: Oral   Oral  SpO2: 93%  95% 91%  Weight:  123.5 kg (272 lb 3.2 oz)    Height:        Intake/Output Summary (Last 24 hours) at 08/03/16 0905 Last data filed at 08/03/16 0720  Gross per 24 hour  Intake              243 ml  Output             2150 ml  Net            -1907 ml   Filed Weights   08/01/16 0405  08/02/16 2024 08/03/16 0716  Weight: 127.2 kg (280 lb 8 oz) 125.1 kg (275 lb 14.4 oz) 123.5 kg (272 lb 3.2 oz)    Exam: General exam: Sitting up in chair with mild breathlessness. Respiratory system: Breath sounds diminished with a few bibasilar crackles. Respiratory effort mildly labored at rest. Cardiovascular system: S1 & S2 heard, RRR. No JVD,  rubs, gallops or clicks. No murmurs. Gastrointestinal system: Abdomen is nondistended, soft and nontender. No organomegaly or masses felt. Normal bowel sounds heard. Central nervous system: Alert and oriented. No focal neurological  deficits. Extremities: Continue to be massively swollen, but slightly less so today.Currently re-wrapped in [employer]. Skin: Scattered ecchymosis and significant lower extremity venous stasis dermatitis. Psychiatry: Judgement and insight appear normal. Mood & affect anxious.      Data Reviewed:   I have personally reviewed following labs and imaging studies:  Labs: Basic Metabolic Panel:  Recent Labs Lab 07/30/16 1602 07/30/16 1622 07/31/16 0919 08/01/16 0252 08/02/16 0409 08/03/16 0304  NA 140 135 139 137 136 135  K 3.9 3.8 3.6 3.2* 3.0* 3.5  CL 89* 99* 89* 86* 84* 82*  CO2 41*  --  40* 42* 43* 41*  GLUCOSE 90 95 92 92 97 99  BUN 12 8 11 10 11 16   CREATININE 0.89 0.90 0.97 0.91 0.97 1.07*  CALCIUM 9.8  --  9.2 8.9 9.2 9.1  MG  --   --   --   --  1.9  --    GFR Estimated Creatinine Clearance: 62.5 mL/min (by C-G formula based on SCr of 1.07 mg/dL (H)). Liver Function Tests:  Recent Labs Lab 07/30/16 2158  AST 13*  ALT 12*  ALKPHOS 53  BILITOT 0.4  PROT 6.7  ALBUMIN 3.1*   CBC:  Recent Labs Lab 07/30/16 1602 07/30/16 1622  WBC 4.2  --   NEUTROABS 2.6  --   HGB 10.9* 13.9  HCT 37.9 41.0  MCV 106.8*  --   PLT 159  --    Cardiac Enzymes:  Recent Labs Lab 07/30/16 2158 07/31/16 0153 07/31/16 0919  TROPONINI <0.03 <0.03 <0.03   Microbiology Recent Results (from the past 240 hour(s))  MRSA PCR Screening     Status: None   Collection Time: 07/31/16  6:57 AM  Result Value Ref Range Status   MRSA by PCR NEGATIVE NEGATIVE Final    Comment:        The GeneXpert MRSA Assay (FDA approved for NASAL specimens only), is one component of a comprehensive MRSA colonization surveillance program. It is not intended to diagnose MRSA infection nor to guide or monitor treatment for MRSA infections.     Radiology: No results found.  Medications:   . carvedilol  6.25 mg Oral BID WC  . cholestyramine  4 g Oral BID  . enoxaparin (LOVENOX) injection   40 mg Subcutaneous Q24H  . furosemide  80 mg Intravenous TID  . ipratropium-albuterol  3 mL Nebulization TID  . levothyroxine  300 mcg Oral QAC breakfast  . metolazone  5 mg Oral Daily  . potassium chloride  40 mEq Oral BID  . pravastatin  20 mg Oral QHS  . saccharomyces boulardii  250 mg Oral BID  . sodium chloride flush  3 mL Intravenous Q12H  . sulfaSALAzine  2,000 mg Oral BID   Continuous Infusions:  Medical decision making is of high complexity and this patient is at high risk of deterioration, therefore this is a level 3 visit.  (>  4 problem points, 2 data points, high risk)    LOS: 4 days   Elisabet Gutzmer  [facility] Pager [phone]. If unable to reach me by pager, please call my cell phone at [phone].  *Please refer to [email or web address], password [email or web address] to get updated schedule on who will round on this patient, as hospitalists switch teams weekly. If 7PM-7AM, please contact night-coverage at [email or web address], password [email or web address] for any overnight needs.  08/03/2016, 9:05 AM

## 2016-08-04 DIAGNOSIS — H6122 Impacted cerumen, left ear: Secondary | ICD-10-CM

## 2016-08-04 LAB — BASIC METABOLIC PANEL
Anion gap: 8 (ref 5–15)
BUN: 18 mg/dL (ref 6–20)
CALCIUM: 8.9 mg/dL (ref 8.9–10.3)
CO2: 43 mmol/L — AB (ref 22–32)
CREATININE: 0.97 mg/dL (ref 0.44–1.00)
Chloride: 83 mmol/L — ABNORMAL LOW (ref 101–111)
GFR calc Af Amer: >60 mL/min (ref >60)
GFR calc non Af Amer: 57 mL/min — ABNORMAL LOW (ref >60)
GLUCOSE: 107 mg/dL — AB (ref 65–99)
Potassium: 3.2 mmol/L — ABNORMAL LOW (ref 3.5–5.1)
Sodium: 134 mmol/L — ABNORMAL LOW (ref 135–145)

## 2016-08-04 MED ORDER — CARBAMIDE PEROXIDE 6.5 % OT SOLN
5.0000 [drp] | Freq: Two times a day (BID) | OTIC | Status: DC
  Administered 2016-08-04 (×2): 5 [drp] via OTIC
  Filled 2016-08-04: qty 15

## 2016-08-04 NOTE — Progress Notes (Signed)
Progress Note    JOELYS STAUBS  DTO:[id number] DOB: 1946/03/23  DOA: 07/30/2016 PCP: Leonides Sake, MD    Brief Narrative:   Chief complaint: Follow-up dyspnea/hypoxia.  LASTACIA SOLUM is an 71 y.o. female PMH of CHF (no prior 2-D echoes on record), ulcerative colitis, esophageal stenosis, obesity, chronic respiratory failure secondary COPD on 2.5 L of home oxygen, hypothyroidism and anxiety disorder was admitted 07/30/16 for evaluation of worsening dyspnea and weight gain. BNP was 8522 on admission. She was noted to be hypoxic with oxygen saturation of 76% on 2 L. Chest x-ray showed mild vascular congestion/atelectasis.  Assessment/Plan:   Principal Problem:   Acute on chronic respiratory failure with hypoxia (HCC) secondary to acute on chronic diastolic CHF exacerbation  Patients worsening respiratory failure appears to be from a CHF exacerbation given her marked elevation of BNP and findings as noted on chest x-ray (personally reviewed). Troponin negative 3. On Coreg already.2-D echo showed EF 60-65 percent, no regional wall motion abnormalities, and grade 2 diastolic dysfunction. Continue Lasix 80 mg IV 2 times a day and Zaroxolyn, I/O balance negative 2 L. Weight down about 11 pounds.Oxygen saturations improved and oxygen requirement back down to 2-3 liters/min via [place]. Creatinine stable with reduction in IV Lasix dose to twice a day. Continue to diurese. Lower extremity edema improving.    Active Problems:   Nonsustained ventricular tachycardia Likely from electrolyte imbalances with diuresis. Magnesium okay. Potassium 3.5. We'll increase potassium supplementation.    Chronic venous stasis dermatitis Evaluated by wound care nurse with Unna boots to be applied and changed every other day. Monitor closely for superimposed cellulitis as she is at high risk given impaired skin integrity.    OBESITY, MORBID Body mass index is 47.4 kg/m. Dietitian consultation for diet education.    COPD (chronic obstructive pulmonary disease) (HCC) Continue supplemental oxygen. Continue bronchodilators.    Ulcerative colitis, chronic (HCC) Continue sulfasalazine.    HLD (hyperlipidemia) Continue Pravachol.    Hypothyroidism Continue Synthroid.    Anxiety Continue Ativan as needed.    Hypokalemia Secondary to diuretics. Will add routine supplementation.    Cerumen impaction left ear Debrox ordered.   Family Communication/Anticipated D/C date and plan/Code Status   DVT prophylaxis: Lovenox ordered. Code Status: Full Code.  Family Communication: No family currently at the bedside. Disposition Plan: Likely will need another 24-48 hours in the hospital.   Medical Consultants:    None.   Procedures:    None  Anti-Infectives:    None  Subjective:   Irritable that she cannot have a shower because there is no hot water. Otherwise reports some hearing loss in the left ear. Says her breathing is improving. Reports reduction in lower extremity swelling, but thigh still very swollen.  Objective:    Vitals:   08/03/16 2100 08/04/16 0525 08/04/16 0900 08/04/16 1045  BP: (!) 110/45 (!) 108/33    Pulse:  79    Resp:  16    Temp:  97.7 F (36.5 C)    TempSrc:  Oral    SpO2:  94%  95%  Weight:   122.3 kg (269 lb 11.2 oz)   Height:        Intake/Output Summary (Last 24 hours) at 08/04/16 1422 Last data filed at 08/04/16 0900  Gross per 24 hour  Intake             1080 ml  Output  3100 ml  Net            -2020 ml   Filed Weights   08/02/16 2024 08/03/16 0716 08/04/16 0900  Weight: 125.1 kg (275 lb 14.4 oz) 123.5 kg (272 lb 3.2 oz) 122.3 kg (269 lb 11.2 oz)    Exam: General exam: Sitting up in chair with mild breathlessness. Respiratory system: Breath sounds diminished with a few bibasilar crackles. Respiratory effort mildly labored at rest. Cardiovascular system: S1 & S2 heard, RRR. No JVD,  rubs, gallops or clicks. No  murmurs. Gastrointestinal system: Abdomen is nondistended, soft and nontender. No organomegaly or masses felt. Normal bowel sounds heard. Central nervous system: Alert and oriented. No focal neurological deficits. Extremities: Thigh still quite swollen, but lower extremity swelling has improved as pictured below. Skin: Scattered ecchymosis and significant lower extremity venous stasis dermatitis. Psychiatry: Judgement and insight appear normal. Mood & affect anxious.         Data Reviewed:   I have personally reviewed following labs and imaging studies:  Labs: Basic Metabolic Panel:  Recent Labs Lab 07/31/16 0919 08/01/16 0252 08/02/16 0409 08/03/16 0304 08/04/16 0244  NA 139 137 136 135 134*  K 3.6 3.2* 3.0* 3.5 3.2*  CL 89* 86* 84* 82* 83*  CO2 40* 42* 43* 41* 43*  GLUCOSE 92 92 97 99 107*  BUN 11 10 11 16 18   CREATININE 0.97 0.91 0.97 1.07* 0.97  CALCIUM 9.2 8.9 9.2 9.1 8.9  MG  --   --  1.9  --   --    GFR Estimated Creatinine Clearance: 68.5 mL/min (by C-G formula based on SCr of 0.97 mg/dL). Liver Function Tests:  Recent Labs Lab 07/30/16 2158  AST 13*  ALT 12*  ALKPHOS 53  BILITOT 0.4  PROT 6.7  ALBUMIN 3.1*   CBC:  Recent Labs Lab 07/30/16 1602 07/30/16 1622  WBC 4.2  --   NEUTROABS 2.6  --   HGB 10.9* 13.9  HCT 37.9 41.0  MCV 106.8*  --   PLT 159  --    Cardiac Enzymes:  Recent Labs Lab 07/30/16 2158 07/31/16 0153 07/31/16 0919  TROPONINI <0.03 <0.03 <0.03   Microbiology Recent Results (from the past 240 hour(s))  MRSA PCR Screening     Status: None   Collection Time: 07/31/16  6:57 AM  Result Value Ref Range Status   MRSA by PCR NEGATIVE NEGATIVE Final    Comment:        The GeneXpert MRSA Assay (FDA approved for NASAL specimens only), is one component of a comprehensive MRSA colonization surveillance program. It is not intended to diagnose MRSA infection nor to guide or monitor treatment for MRSA infections.      Radiology: No results found.  Medications:   . carbamide peroxide  5 drop Left Ear BID  . carvedilol  6.25 mg Oral BID WC  . cholestyramine  4 g Oral BID  . enoxaparin (LOVENOX) injection  40 mg Subcutaneous Q24H  . furosemide  80 mg Intravenous BID  . ipratropium-albuterol  3 mL Nebulization TID  . levothyroxine  300 mcg Oral QAC breakfast  . metolazone  5 mg Oral Daily  . potassium chloride  40 mEq Oral TID  . pravastatin  20 mg Oral QHS  . saccharomyces boulardii  250 mg Oral BID  . sodium chloride flush  3 mL Intravenous Q12H  . sulfaSALAzine  2,000 mg Oral BID   Continuous Infusions:  Medical decision making is of high  complexity and this patient is at high risk of deterioration, therefore this is a level 3 visit.  (> 4 problem points, 2 data points, high risk)    LOS: 5 days   RAMA,CHRISTINA  [facility] Pager [phone]. If unable to reach me by pager, please call my cell phone at [phone].  *Please refer to [email or web address], password [email or web address] to get updated schedule on who will round on this patient, as hospitalists switch teams weekly. If 7PM-7AM, please contact night-coverage at [email or web address], password [email or web address] for any overnight needs.  08/04/2016, 2:22 PM

## 2016-08-04 NOTE — Progress Notes (Signed)
Orthopedic Tech Progress Note Patient Details:  Jamie Montoya Jul 30, 1945 [id number]  Ortho Devices Type of Ortho Device: Ace wrap, Unna boot Ortho Device/Splint Location: Bilateral unna boot Ortho Device/Splint Interventions: Application   [name] 08/04/2016, 5:12 PM

## 2016-08-04 NOTE — Plan of Care (Signed)
Problem: Safety: Goal: Ability to remain free from injury will improve Outcome: Progressing Patient refusing to have both side rails up, she wants only one up, patient has been educated regarding safety issues, she verbalized understanding  Problem: Pain Managment: Goal: General experience of comfort will improve Outcome: Progressing Denies pain and discomfort  Problem: Physical Regulation: Goal: Will remain free from infection Outcome: Progressing No S/S of infection noted VS WNL  Problem: Skin Integrity: Goal: Risk for impaired skin integrity will decrease Outcome: Not Progressing Cellulitis to both BLE  Problem: Tissue Perfusion: Goal: Risk factors for ineffective tissue perfusion will decrease Outcome: Progressing Patient is on Lovenox, no S/S of DVT noted  Problem: Fluid Volume: Goal: Ability to maintain a balanced intake and output will improve Outcome: Progressing Patient is on 1200 ml fluid restrictions and is compliant  Problem: Bowel/Gastric: Goal: Will not experience complications related to bowel motility Outcome: Progressing No gastric or bowel issues reported  Problem: Activity: Goal: Capacity to carry out activities will improve Outcome: Progressing In and out of bed independently, refusing assistance, tolerates activities well

## 2016-08-05 LAB — BASIC METABOLIC PANEL
Anion gap: 13 (ref 5–15)
BUN: 24 mg/dL — AB (ref 6–20)
CO2: 38 mmol/L — ABNORMAL HIGH (ref 22–32)
CREATININE: 1.11 mg/dL — AB (ref 0.44–1.00)
Calcium: 9.2 mg/dL (ref 8.9–10.3)
Chloride: 85 mmol/L — ABNORMAL LOW (ref 101–111)
GFR, EST AFRICAN AMERICAN: 56 mL/min — AB (ref >60)
GFR, EST NON AFRICAN AMERICAN: 48 mL/min — AB (ref >60)
Glucose, Bld: 106 mg/dL — ABNORMAL HIGH (ref 65–99)
POTASSIUM: 3.6 mmol/L (ref 3.5–5.1)
SODIUM: 136 mmol/L (ref 135–145)

## 2016-08-05 MED ORDER — DIPHENHYDRAMINE-ZINC ACETATE 2-0.1 % EX CREA: TOPICAL_CREAM | Freq: Three times a day (TID) | CUTANEOUS | 0 refills | Status: DC | PRN

## 2016-08-05 MED ORDER — DIPHENHYDRAMINE-ZINC ACETATE 2-0.1 % EX CREA
1.0000 "application " | TOPICAL_CREAM | Freq: Three times a day (TID) | CUTANEOUS | Status: DC | PRN
  Filled 2016-08-05: qty 28

## 2016-08-05 MED ORDER — CARBAMIDE PEROXIDE 6.5 % OT SOLN: 5.0000 [drp] | Freq: Two times a day (BID) | OTIC | 0 refills | Status: DC

## 2016-08-05 MED ORDER — POTASSIUM CHLORIDE CRYS ER 20 MEQ PO TBCR
40.0000 meq | EXTENDED_RELEASE_TABLET | Freq: Two times a day (BID) | ORAL | Status: DC
  Administered 2016-08-05: 40 meq via ORAL
  Filled 2016-08-05: qty 2

## 2016-08-05 MED ORDER — SULFASALAZINE 500 MG PO TABS: 2000.0000 mg | ORAL_TABLET | Freq: Two times a day (BID) | ORAL | Status: DC

## 2016-08-05 MED ORDER — FUROSEMIDE 40 MG PO TABS
40.0000 mg | ORAL_TABLET | Freq: Two times a day (BID) | ORAL | Status: DC
  Filled 2016-08-05: qty 1

## 2016-08-05 MED ORDER — METOLAZONE 5 MG PO TABS: 5.0000 mg | ORAL_TABLET | Freq: Every day | ORAL | 2 refills | Status: DC

## 2016-08-05 NOTE — Consult Note (Signed)
WOC follow-up: Ortho tech has been changing Una boots every other day as requested, according to the EMR.  Continue present plan of care since patient is still in the hospital; change Q Sun/Tues/Thurs.  Please re-consult if further assistance is needed.  Thank-you,  Julien Girt MSN, [place], [place], [place], [place]

## 2016-08-05 NOTE — Discharge Summary (Signed)
Physician Discharge Summary  Jamie Montoya JYN:[id number] DOB: Jul 30, 1945 DOA: 07/30/2016  PCP: Leonides Sake, MD  Admit date: 07/30/2016 Discharge date: 08/05/2016  Admitted From: Home Discharge disposition: Home   Recommendations for Outpatient Follow-Up:   1. Home health nursing services including PT, OT and RN have been ordered for the patient. 2. Recommend follow-up with PCP in one week, check creatinine and electrolytes at that visit.   Discharge Diagnosis:   Principal Problem:    Acute on chronic respiratory failure with hypoxia (HCC) Active Problems:    OBESITY, MORBID    COPD (chronic obstructive pulmonary disease) (HCC)    Ulcerative colitis, chronic (HCC)    Acute on chronic diastolic CHF    HLD (hyperlipidemia)    Hypothyroidism    Anxiety    Chronic venous stasis dermatitis of both lower extremities    Hypokalemia    Hearing loss due to cerumen impaction, left  Discharge Condition: Improved.  Diet recommendation: Low sodium, heart healthy.    Wound care: Change Unna boots twice weekly.   History of Present Illness:   Jamie Montoya is an 71 y.o. female PMH of CHF (no prior 2-D echoes on record), ulcerative colitis, esophageal stenosis, obesity, chronic respiratory failure secondary COPD on 2.5 L of home oxygen, hypothyroidism and anxiety disorder was admitted 07/30/16 for evaluation of worsening dyspnea and weight gain. BNP was 8522 on admission. She was noted to be hypoxic with oxygen saturation of 76% on 2 L. Chest x-ray showed mild vascular congestion/atelectasis.  Hospital Course by Problem:   Principal Problem:   Acute on chronic respiratory failure with hypoxia (HCC) secondary to acute on chronic diastolic CHF exacerbation  Patients worsening respiratory failure appears to be from a CHF exacerbation given her marked elevation of BNP and findings as noted on chest x-ray (personally reviewed). Troponin was negative 3. .2-D echo showed EF 60-65  percent, no regional wall motion abnormalities, and grade 2 diastolic dysfunction. Diuresed well on Lasix 80 mg IV 2 times a day and Zaroxolyn. Oxygen saturations improved and oxygen requirement back down to 2-3 liters/min via [place]. Creatinine sllightly up with less diuresis over the past 24 hours, so she is likely at her dry weight. D/C home on Lasix to 40 mg orally twice a day, home dose of Coreg and Zaroxolyn 5 mg daily.Home health nurse to teach disease management. Dry weight appears to be around 274 pounds.    Active Problems:   Nonsustained ventricular tachycardia Likely from electrolyte shifts with diuresis, only one episode.    Chronic venous stasis dermatitis Evaluated by wound care nurse with Unna boots to be applied and changed every other day. Monitor closely for superimposed cellulitis as she is at high risk given impaired skin integrity.    OBESITY, MORBID Body mass index is 47.4 kg/m. Dietitian provided diet education.    COPD (chronic obstructive pulmonary disease) (HCC) Continue supplemental oxygen. Continue bronchodilators.    Ulcerative colitis, chronic (HCC) Continue sulfasalazine.    HLD (hyperlipidemia) Continue Pravachol.    Hypothyroidism Continue Synthroid.    Anxiety Continue Ativan as needed.    Hypokalemia Secondary to diuretics. Continue routine supplementation.    Cerumen impaction left ear Debrox ordered.    Medical Consultants:    None.   Discharge Exam:   Vitals:   08/04/16 2100 08/05/16 0558  BP: (!) 101/39 (!) 110/35  Pulse: 76 82  Resp: 18 18  Temp: 98.6 F (37 C) 98.2 F (36.8 C)   Vitals:  08/04/16 1500 08/04/16 2100 08/05/16 0558 08/05/16 0933  BP: (!) 115/40 (!) 101/39 (!) 110/35   Pulse: 76 76 82   Resp: 16 18 18    Temp: 97.6 F (36.4 C) 98.6 F (37 C) 98.2 F (36.8 C)   TempSrc: Oral Oral Oral   SpO2: 96% 94% 97% 97%  Weight:   124.6 kg (274 lb 12.8 oz)   Height:        General exam: Appears calm  and comfortable.  Respiratory system: Clear to auscultation. Respiratory effort Increased with activity, but appears better at rest now. Cardiovascular system: S1 & S2 heard, RRR. No JVD,  rubs, gallops or clicks. No murmurs. Gastrointestinal system: Abdomen is nondistended, soft and nontender. No organomegaly or masses felt. Normal bowel sounds heard. Central nervous system: Alert and oriented. No focal neurological deficits. Extremities: Thighs remain swollen, but she has marked decrease in swelling to her lower legs as pictured below. Skin: Prominent venous stasis dermatitis. Psychiatry: Judgement and insight appear normal. Mood & affect appropriate.        The results of significant diagnostics from this hospitalization (including imaging, microbiology, ancillary and laboratory) are listed below for reference.     Procedures and Diagnostic Studies:   Dg Chest 2 View  Result Date: 07/30/2016 CLINICAL DATA:  Dyspnea. History of CHF and COPD. Lower extremity swelling. Abdominal distension. EXAM: CHEST  2 VIEW COMPARISON:  05/03/2015 chest radiograph. FINDINGS: Stable cardiomediastinal silhouette with mild cardiomegaly and aortic atherosclerosis. No pneumothorax. No pleural effusion. Borderline mild pulmonary edema. Linear opacities overlying the lower lobes on the lateral view. IMPRESSION: 1. Borderline mild congestive heart failure. 2. Linear opacities overlying the lower lobes on the lateral view, favor atelectasis. 3. Aortic atherosclerosis. Electronically Signed   By: Ilona Sorrel M.D.   On: 07/30/2016 17:23     Labs:   Basic Metabolic Panel:  Recent Labs Lab 08/01/16 0252 08/02/16 0409 08/03/16 0304 08/04/16 0244 08/05/16 0326  NA 137 136 135 134* 136  K 3.2* 3.0* 3.5 3.2* 3.6  CL 86* 84* 82* 83* 85*  CO2 42* 43* 41* 43* 38*  GLUCOSE 92 97 99 107* 106*  BUN 10 11 16 18  24*  CREATININE 0.91 0.97 1.07* 0.97 1.11*  CALCIUM 8.9 9.2 9.1 8.9 9.2  MG  --  1.9  --   --   --      GFR Estimated Creatinine Clearance: 60.5 mL/min (by C-G formula based on SCr of 1.11 mg/dL (H)). Liver Function Tests:  Recent Labs Lab 07/30/16 2158  AST 13*  ALT 12*  ALKPHOS 53  BILITOT 0.4  PROT 6.7  ALBUMIN 3.1*   CBC:  Recent Labs Lab 07/30/16 1602 07/30/16 1622  WBC 4.2  --   NEUTROABS 2.6  --   HGB 10.9* 13.9  HCT 37.9 41.0  MCV 106.8*  --   PLT 159  --    Cardiac Enzymes:  Recent Labs Lab 07/30/16 2158 07/31/16 0153 07/31/16 0919  TROPONINI <0.03 <0.03 <0.03   Microbiology Recent Results (from the past 240 hour(s))  MRSA PCR Screening     Status: None   Collection Time: 07/31/16  6:57 AM  Result Value Ref Range Status   MRSA by PCR NEGATIVE NEGATIVE Final    Comment:        The GeneXpert MRSA Assay (FDA approved for NASAL specimens only), is one component of a comprehensive MRSA colonization surveillance program. It is not intended to diagnose MRSA infection nor to guide or monitor  treatment for MRSA infections.      Discharge Instructions:   Discharge Instructions    (HEART FAILURE PATIENTS) Call MD:  Anytime you have any of the following symptoms: 1) 3 pound weight gain in 24 hours or 5 pounds in 1 week 2) shortness of breath, with or without a dry hacking cough 3) swelling in the hands, feet or stomach 4) if you have to sleep on extra pillows at night in order to breathe.    Complete by:  As directed    Call MD for:  difficulty breathing, headache or visual disturbances    Complete by:  As directed    Call MD for:  extreme fatigue    Complete by:  As directed    Call MD for:  persistant dizziness or light-headedness    Complete by:  As directed    Call MD for:  persistant nausea and vomiting    Complete by:  As directed    Call MD for:  severe uncontrolled pain    Complete by:  As directed    Diet - low sodium heart healthy    Complete by:  As directed    Increase activity slowly    Complete by:  As directed      Allergies  as of 08/05/2016      Reactions   Tape Rash      Medication List    STOP taking these medications   amoxicillin-clavulanate 875-125 MG tablet Commonly known as:  AUGMENTIN   predniSONE 5 MG tablet Commonly known as:  DELTASONE   sulfamethoxazole-trimethoprim 800-160 MG tablet Commonly known as:  BACTRIM DS,SEPTRA DS     TAKE these medications   albuterol (2.5 MG/3ML) 0.083% nebulizer solution Commonly known as:  PROVENTIL Take 3 mLs by nebulization 4 (four) times daily as needed for wheezing or shortness of breath.   Biotin 5000 MCG Tabs Take 1 tablet by mouth at bedtime.   carbamide peroxide 6.5 % otic solution Commonly known as:  DEBROX Place 5 drops into the left ear 2 (two) times daily.   carvedilol 6.25 MG tablet Commonly known as:  COREG Take 6.25 mg by mouth 2 (two) times daily with a meal.   cholestyramine 4 g packet Commonly known as:  QUESTRAN Take 4 g by mouth 2 (two) times daily.   diphenhydrAMINE-zinc acetate cream Commonly known as:  BENADRYL Apply topically 3 (three) times daily as needed for itching.   furosemide 40 MG tablet Commonly known as:  LASIX Take 40-80 mg by mouth See admin instructions. 80 mg in the morning and 40 mg in the evening   levothyroxine 300 MCG tablet Commonly known as:  SYNTHROID, LEVOTHROID Take 300 mcg by mouth daily before breakfast.   LORazepam 1 MG tablet Commonly known as:  ATIVAN Take 1 mg by mouth every 8 (eight) hours as needed for anxiety.   metolazone 5 MG tablet Commonly known as:  ZAROXOLYN Take 1 tablet (5 mg total) by mouth daily. Start taking on:  08/06/2016   oxymetazoline 0.05 % nasal spray Commonly known as:  AFRIN Place 1 spray into both nostrils 2 (two) times daily as needed for congestion.   potassium chloride SA 20 MEQ tablet Commonly known as:  K-DUR,KLOR-CON Take 20 mEq by mouth daily.   pravastatin 20 MG tablet Commonly known as:  PRAVACHOL Take 20 mg by mouth at bedtime.   Probiotic  Caps Take 1 capsule by mouth daily.   sulfaSALAzine 500 MG tablet Commonly known  as:  AZULFIDINE Take 4 tablets (2,000 mg total) by mouth 2 (two) times daily.      Follow-up Information    HAMRICK,MAURA L, MD. Schedule an appointment as soon as possible for a visit in 1 week(s).   Specialty:  Family Medicine Why:  Or one of her partners for hospital follow up. Contact information: [place] [address] [facility] Follow up.   Specialty:  Home Health Services Why:  [facility] will resume Home health. Someone will contact you to arrange start date and time for Therapy and nursing.  Contact information: [address] [place] [address] [phone]            Time coordinating discharge: 35 minutes.  Signed:  Steed Kanaan  Pager [phone] [facility] 08/05/2016, 1:55 PM

## 2016-08-05 NOTE — Discharge Instructions (Signed)
Heart Failure  Heart failure means your heart has trouble pumping blood. This makes it hard for your body to work well. Heart failure is usually a long-term (chronic) condition. You must take good care of yourself and follow your doctor's treatment plan.  HOME CARE   Take your heart medicine as told by your doctor.    Do not stop taking medicine unless your doctor tells you to.    Do not skip any dose of medicine.    Refill your medicines before they run out.    Take other medicines only as told by your doctor or pharmacist.   Stay active if told by your doctor. The elderly and people with severe heart failure should talk with a doctor about physical activity.   Eat heart-healthy foods. Choose foods that are without trans fat and are low in saturated fat, cholesterol, and salt (sodium). This includes fresh or frozen fruits and vegetables, fish, lean meats, fat-free or low-fat dairy foods, whole grains, and high-fiber foods. Lentils and dried peas and beans (legumes) are also good choices.   Limit salt if told by your doctor.   Cook in a healthy way. Roast, grill, broil, bake, poach, steam, or stir-fry foods.   Limit fluids as told by your doctor.   Weigh yourself every morning. Do this after you pee (urinate) and before you eat breakfast. Write down your weight to give to your doctor.   Take your blood pressure and write it down if your doctor tells you to.   Ask your doctor how to check your pulse. Check your pulse as told.   Lose weight if told by your doctor.   Stop smoking or chewing tobacco. Do not use gum or patches that help you quit without your doctor's approval.   Schedule and go to doctor visits as told.   Nonpregnant women should have no more than 1 drink a day. Men should have no more than 2 drinks a day. Talk to your doctor about drinking alcohol.   Stop illegal drug use.   Stay current with shots (immunizations).   Manage your health conditions as told by your doctor.   Learn to  manage your stress.   Rest when you are tired.   If it is really hot outside:    Avoid intense activities.    Use air conditioning or fans, or get in a cooler place.    Avoid caffeine and alcohol.    Wear loose-fitting, lightweight, and light-colored clothing.   If it is really cold outside:    Avoid intense activities.    Layer your clothing.    Wear mittens or gloves, a hat, and a scarf when going outside.    Avoid alcohol.   Learn about heart failure and get support as needed.   Get help to maintain or improve your quality of life and your ability to care for yourself as needed.  GET HELP IF:    You gain weight quickly.   You are more short of breath than usual.   You cannot do your normal activities.   You tire easily.   You cough more than normal, especially with activity.   You have any or more puffiness (swelling) in areas such as your hands, feet, ankles, or belly (abdomen).   You cannot sleep because it is hard to breathe.   You feel like your heart is beating fast (palpitations).   You get dizzy or light-headed when you stand up.  GET HELP   RIGHT AWAY IF:    You have trouble breathing.   There is a change in mental status, such as becoming less alert or not being able to focus.   You have chest pain or discomfort.   You faint.  MAKE SURE YOU:    Understand these instructions.   Will watch your condition.   Will get help right away if you are not doing well or get worse.     This information is not intended to replace advice given to you by your health care provider. Make sure you discuss any questions you have with your health care provider.     Document Released: 03/19/2008 Document Revised: 07/01/2014 Document Reviewed: 07/27/2012  Elsevier Interactive Patient Education 2017 [employer]

## 2016-08-05 NOTE — Care Management Note (Signed)
Case Management Note  Patient Details  Name: Jamie Montoya MRN: [id number] Date of Birth: 1945/10/01  Subjective/Objective:    71 yr old female admitted with Acute on chronic respiratory failure with hypoxia ([place]) secondary to acute on chronic diastolic CHF exacerbation and chronic venous stasis dermatitis (now with Unna Boot dressings) .            Action/Plan: Case manager spoke with patient concerning discharge plans. Patient has been receiving [facility] through [facility], will resume. Case manager has contacted [facility], spoke with Tillie Rung, and faxed orders for resumption of care and orders for PT/OT. Patient states her daughter will bring her oxygen for travel.    Expected Discharge Date:  08/05/16               Expected Discharge Plan:  [place]  In-House Referral:  NA  Discharge planning Services  CM Consult  Post Acute Care Choice:  Home Health, Resumption of Svcs/PTA Provider Choice offered to:  Patient  DME Arranged:  N/A DME Agency:  NA  HH Arranged:  PT, OT, RN [place] Agency:  [facility]  Status of Service:  Completed, signed off  If discussed at [place] of Stay Meetings, dates discussed:    Additional Comments:  Ninfa Meeker, RN 08/05/2016, 11:03 AM

## 2016-08-05 NOTE — Progress Notes (Signed)
Pt ready for discharge to home with family here eto transport. All belongings with pt. Discharge information reviewed with pt. RX forwarded to pharmacy by physician.

## 2016-08-05 NOTE — Evaluation (Signed)
Physical Therapy Evaluation Patient Details Name: Jamie Montoya MRN: [id number] DOB: 23-Feb-1946 Today's Date: 08/05/2016   History of Present Illness  Jamie Montoya is an 71 y.o. female PMH of CHF (no prior 2-D echoes on record), ulcerative colitis, esophageal stenosis, obesity, chronic respiratory failure secondary COPD on 2.5 L of home oxygen, hypothyroidism and anxiety disorder was admitted 07/30/16 for evaluation of worsening dyspnea and weight gain. Principal problems Acute on chronic respiratory failure with hypoxia (HCC) secondary to acute on chronic diastolic CHF exacerbation and chronic venous stasis dermatitis (now with Unna Boot dressings)  Clinical Impression   Pt admitted with above diagnosis. Pt currently with functional limitations due to the deficits listed below (see PT Problem List). Presents with generalized weakness and decr activity tolerance; Pt also indicated she has difficulty managing her compression stockings at home -- rec OT consult, and follow up HHPT/OT/RN at discharge;  Pt will benefit from skilled PT to increase their independence and safety with mobility to allow discharge to the venue listed below.       Follow Up Recommendations Home health PT;Other (comment) (HHOT, HHRN for chronic disease management)    Equipment Recommendations  3in1 (PT)    Recommendations for Other Services OT consult     Precautions / Restrictions Precautions Precaution Comments: Educated pt that when she is sitting, must elevate feet Required Braces or Orthoses: Other Brace/Splint Other Brace/Splint: Bil Unna Boot dressings Restrictions Weight Bearing Restrictions: No      Mobility  Bed Mobility Overal bed mobility: Needs Assistance Bed Mobility: Supine to Sit     Supine to sit: Supervision     General bed mobility comments: Supervision for safety  Transfers Overall transfer level: Needs assistance Equipment used: Rolling walker (2 wheeled) Transfers: Sit to/from  Stand Sit to Stand: Min guard         General transfer comment: Min guard for safety; cues to self-monitor for activity tolerance  Ambulation/Gait Ambulation/Gait assistance: Min guard;Supervision Ambulation Distance (Feet): 80 Feet Assistive device: Rolling walker (2 wheeled) Gait Pattern/deviations: Step-through pattern;Decreased step length - right;Decreased step length - left;Decreased stride length   Gait velocity interpretation: Below normal speed for age/gender General Gait Details: minguard assist at first, progressing to Supervision; Cues to self-monitor for activity tolerance; walked on 3 L supplemental O2, O2 sats ranged 90-93%  Stairs            Wheelchair Mobility    Modified Rankin (Stroke Patients Only)       Balance                                             Pertinent Vitals/Pain Pain Assessment: No/denies pain (but is quite itchy)    Home Living Family/patient expects to be discharged to:: Private residence Living Arrangements: Alone Available Help at Discharge: Family;Other (Comment);Available PRN/intermittently (Niece helps clean home) Type of Home: House Home Access: Ramped entrance     Home Layout: One level Home Equipment: Walker - 4 wheels (adjustable bed)      Prior Function Level of Independence: Independent with assistive device(s)         Comments: walks with Rollator RW; typically sleeps in recliner-type chair     Hand Dominance        Extremity/Trunk Assessment   Upper Extremity Assessment Upper Extremity Assessment: Generalized weakness    Lower Extremity Assessment Lower Extremity Assessment:  Generalized weakness (bil Unna Boot dressings)       Communication   Communication: No difficulties  Cognition Arousal/Alertness: Awake/alert Behavior During Therapy: WFL for tasks assessed/performed Overall Cognitive Status: Within Functional Limits for tasks assessed                       General Comments      Exercises     Assessment/Plan    PT Assessment Patient needs continued PT services  PT Problem List Decreased strength;Decreased range of motion;Decreased activity tolerance;Decreased balance;Decreased mobility;Decreased knowledge of precautions;Obesity;Cardiopulmonary status limiting activity;Decreased skin integrity          PT Treatment Interventions DME instruction;Gait training;Functional mobility training;Therapeutic activities;Therapeutic exercise;Balance training;Patient/family education    PT Goals (Current goals can be found in the Care Plan section)  Acute Rehab PT Goals Patient Stated Goal: Hopes to go home today PT Goal Formulation: With patient Time For Goal Achievement: 08/12/16 Potential to Achieve Goals: Good    Frequency Min 3X/week   Barriers to discharge        Co-evaluation               End of Session Equipment Utilized During Treatment: Gait belt;Oxygen Activity Tolerance: Patient tolerated treatment well Patient left: with call bell/phone within reach;Other (comment);with nursing/sitter in room (in bathroom) Nurse Communication: Mobility status         Time: [phone] PT Time Calculation (min) (ACUTE ONLY): 24 min   Charges:   PT Evaluation $PT Eval Low Complexity: 1 Procedure PT Treatments $Gait Training: 8-22 mins   PT G Codes:        Colletta [place] 08/05/2016, 9:27 AM  Roney Marion, [place] Pager [phone] Office [phone]

## 2016-08-06 ENCOUNTER — Other Ambulatory Visit: Payer: Self-pay | Admitting: Internal Medicine

## 2016-08-07 NOTE — Consult Note (Signed)
           [facility] CM Primary Care Navigator  08/07/2016  Jamie Montoya Jan 15, 1946 [id number]   Went back to see patient at the bedside to identify possible discharge needs but she was already discharged home with home health services.  Primary care provider's office called [name]) to notify of patient's discharge and need for post hospital follow-up and transition of care.  Made aware to refer patient to [facility] care management if deemed appropriate for services.  For questions, please contact:  Dannielle Huh, BSN, RN- [facility] Primary Care Navigator  Telephone: [phone] [place]

## 2016-08-08 ENCOUNTER — Other Ambulatory Visit: Payer: Self-pay

## 2016-08-08 NOTE — Patient Outreach (Addendum)
[place] [facility]) Care Management  08/08/2016  Jamie Montoya 06/06/1946 [id number]  [facility] HF Program Consult, Telephonic Screening and Initial Assessment    Referral Date:  08/08/16 Source:  Emmi Heart Failure Issue:  Red Alert:  Have a way to get to follow-up appointment? NO  Insurance: [facility] Medicare H/o patient is not on APL, however, the insurance card in [place] indicates she is eligible for [employer].  RAF Score: 3.507  Subjective: Outreach call #1 to patient.  Patient completed call.   States transportation issue resolve.  [place] [address] [phone] (H)   Providers: Primary MD:  Previously with Dr. Daiva Eves.  Patient states she has an appointment on Monday with a new MD at the same practice:  [facility] in [place].  Patient unable to recall who the appointment is with.    Cardiologist:  Patient states she has an appointment to see a heart doctor and someone called this morning and gave her the information but she is unable to recall any details.  States she did not have a pencil to write it down at the time of call and is unable to remember the details.  Podiatrist:  Patient unable to recall name or name of facility.  HH: [facility] RN, [place], OT resumed at discharge date on 08/05/2016.  Patient states nurse made a visit yesterday and will be back tomorrow for wound care - legs.   Psycho/Social: Patient is divorced and lives in her home.  Mobility: Ambulates with a walker.  Falls: last fall date 07/30/16 due to low oxygen level.  Transportation:  States transportation issue resolved.  Patient has contacted her daughter who will provide transportation to appointments.  Caregiver: Daughter  Emergency Contact: Daughter  Advance Directive: none.  Patient has paperwork but has not completed.  Confirms interest in completing.  Consent:  Patient consents to discuss her PHI with daughter, [facility] as needed.    DME:  Walker, oxygen  ([facility]), eyeglasses, dentures.  Co-morbidities:  COPD, Chronic diastolic CHF, HLD, Hypothyroidism, Anxiety, Obesity, morbid; chronic ulcerative colitis, chronic venous stasis dermatitis bilateral lower extremities  Admissions: 1    07/30/2016 -  08/05/2016  Acute on chronic respiratory failure with hypoxia.  Patient states she had a fall and this is the reason for admission due to low oxygen level.   H/o Recommend follow-up with PCP in one week, check creatinine and electrolytes at that visit. (Appt scheduled for 2/19)  BP 117/33 07/30/2016 Weight 283 lb (128 kg) 07/30/2016 Height 64 in (163 cm) 07/30/2016 BMI 47.50 (Obese Class III) 07/31/2016  Lipid Panel completed 12/28/2012 HDL 62.000 mg 03/13/2016 LDL 79.000 mg 03/13/2016 Cholesterol, total 166.000 m 03/13/2016 Triglycerides 125.000 m 03/13/2016 A1C 4.300 % 03/13/2016 Glucose Random 106.000 08/05/2016  Medications:  Patient taking less than 15 medications  Co-pay cost issues: none  Prevnar (PCV13) 04/01/2014 Pneumovax (PPS 03/26/2011 Flu Vaccine 04/26/2016 tDAP Vaccine 06/24/2008   Encounter Medications:  Outpatient Encounter Prescriptions as of 08/08/2016  Medication Sig  . albuterol (PROVENTIL) (2.5 MG/3ML) 0.083% nebulizer solution Take 3 mLs by nebulization 4 (four) times daily as needed for wheezing or shortness of breath.   . Biotin 5000 MCG TABS Take 1 tablet by mouth at bedtime.  . carbamide peroxide (DEBROX) 6.5 % otic solution Place 5 drops into the left ear 2 (two) times daily.  . carvedilol (COREG) 6.25 MG tablet Take 6.25 mg by mouth 2 (two) times daily with a meal.  .  cholestyramine (QUESTRAN) 4 g packet Take 4 g by mouth 2 (two) times daily.  . diphenhydrAMINE-zinc acetate (BENADRYL) cream Apply topically 3 (three) times daily as needed for itching.  . furosemide (LASIX) 40 MG tablet Take 40-80 mg by mouth See admin instructions. 80 mg in the morning and 40 mg in the evening  . levothyroxine (SYNTHROID, LEVOTHROID) 300  MCG tablet Take 300 mcg by mouth daily before breakfast.  . LORazepam (ATIVAN) 1 MG tablet Take 1 mg by mouth every 8 (eight) hours as needed for anxiety.  . metolazone (ZAROXOLYN) 5 MG tablet Take 1 tablet (5 mg total) by mouth daily.  Marland Kitchen oxymetazoline (AFRIN) 0.05 % nasal spray Place 1 spray into both nostrils 2 (two) times daily as needed for congestion.  . potassium chloride SA (K-DUR,KLOR-CON) 20 MEQ tablet Take 20 mEq by mouth daily.   . pravastatin (PRAVACHOL) 20 MG tablet Take 20 mg by mouth at bedtime.   . Probiotic CAPS Take 1 capsule by mouth daily.  Marland Kitchen sulfaSALAzine (AZULFIDINE) 500 MG tablet Take 4 tablets (2,000 mg total) by mouth 2 (two) times daily.  Marland Kitchen sulfaSALAzine (AZULFIDINE) 500 MG tablet TAKE 2 TABLETS BY MOUTH 4  TIMES DAILY (Patient not taking: Reported on 08/08/2016)   No facility-administered encounter medications on file as of 08/08/2016.     Functional Status:  In your present state of health, do you have any difficulty performing the following activities: 08/08/2016 08/03/2016  Hearing? Y N  Vision? N N  Difficulty concentrating or making decisions? Y N  Walking or climbing stairs? Y Y  Dressing or bathing? Y -  Doing errands, shopping? Y -  Conservation officer, nature and eating ? Y -  Using the Toilet? Y -  In the past six months, have you accidently leaked urine? N -  Do you have problems with loss of bowel control? N -  Managing your Medications? N -  Managing your Finances? N -  Housekeeping or managing your Housekeeping? Y -  Some recent data might be hidden    Fall/Depression Screening: PHQ 2/9 Scores 08/08/2016  PHQ - 2 Score 0    Fall Risk  08/08/2016  Falls in the past year? Yes  Injury with Fall? Yes  Risk Factor Category  High Fall Risk  Risk for fall due to : History of fall(s);Impaired balance/gait;Impaired mobility    Preventives: Flexible Sigmoidoscopy 11/24/2015 Mammogram 03/13/2016  Plan:  Referral:  08/08/16   [facility] HF Program Consult, Telephonic  Screening and Initial Assessment:  08/08/2016 [facility] Telephonic RN CM 08/08/16 Program:  HF  Care Coordination Services: -MD appointment verification needed with PCP and Cardiologist:   -RN CM will research case for Podiatry contact.    Emmi Education Materials:  (mailed 08/08/16) -What Is Heart Failure? -Heart Failure:  Keeping Track Of Your Weight Each Day -Heart Failure - Home Monitoring  -The Heart and Ejection Fraction (EF) -Heart Failure:  Working With Your Doctor -Heart Failure:  How To Be Salt Smart -Low-Salt Diet -Counting Carbohydrates -Heart Failure:  Understanding Water Pills and Thirst  -Heart Failure:  When To Call Your Doctor or 911 -Heart Zone Action Magnet -Low/high salt food and How to Read a Food Label chart -[facility] Calendar  RN CM advised in next [facility] scheduled contact call within next 30 days for monthly assessment and care coordination services as needed. RN CM advised to please notify MD of any changes in condition prior to scheduled appt's.   RN CM provided contact name and #  [phone] or main office # [phone] and 24-hour nurse line # [phone].  RN CM confirmed patient is aware of 911 services for urgent emergency needs.  RN CM sent successful outreach letter and  [facility] Introductory package. RN CM sent Physician Enrollment/Barriers Letter and Initial Assessment to Primary MD RN CM notified [place] Management Assistant: agreed to services/case opened.  [facility] CM Care Plan Problem One   Flowsheet Row Most Recent Value  Care Plan Problem One  Knowledge deficit associated to HF self management interventions.    Role Documenting the Problem One  Care Management Telephonic [place] for Problem One  Active  THN Long Term Goal (31-90 days)  Patient will engage in HF education with RN CM over the next 31-90 days.   THN Long Term Goal Start Date  08/08/16  Interventions for Problem One Long Term Goal  RN CM will provide disease management education  and emmi materials for review over the next 31-90 days.   THN CM Short Term Goal #1 (0-30 days)  Patient will report changes in condition to avoid hospital readmission over the next 30 days.   THN CM Short Term Goal #1 Start Date  08/08/16  Interventions for Short Term Goal #1  RN CM will provide education on :Know Before You Go edcuation over the next 30 days.     [facility] CM Care Plan Problem Two   Flowsheet Row Most Recent Value  Care Plan Problem Two  MD appointment adherence issues relating to possible transportation issues and memory.   Role Documenting the Problem Two  Care Management Telephonic Coordinator  Care Plan for Problem Two  Active  THN CM Short Term Goal #1 (0-30 days)  Patient will write down appointment information over the next 30 days.   THN CM Short Term Goal #1 Start Date  08/08/16  Interventions for Short Term Goal #2   RN CM will verifiy appointments and details for patient over the next 30 days.   THN CM Short Term Goal #2 (0-30 days)  Patient will pre-arrange transportation to MD appt's with daughter over the next 30 days.   THN CM Short Term Goal #2 Start Date  08/08/16  Interventions for Short Term Goal #2  RN CM will coach on organization over the next 30 days.     [facility] CM Care Plan Problem Three   Flowsheet Row Most Recent Value  Care Plan Problem Three  Advance Directives:  has paperwork but has not completed.   Role Documenting the Problem Three  Care Management Telephonic Coordinator  Care Plan for Problem Three  Active  THN CM Short Term Goal #1 (0-30 days)  Patient will review Advance Directives document and review for questions and clarification on next contact call within 30 days.   THN CM Short Term Goal #1 Start Date  08/08/16  Interventions for Short Term Goal #1  RN CM will continue to monitor and assess for educational needs and guidance on completing Advance Directives over the next 30 days.       Nathaneil Canary, BSN, RN, [place] Management Care Management Coordinator [phone] Direct [phone] Cell [phone] Office [phone] Fax [email or web address]

## 2016-08-09 ENCOUNTER — Other Ambulatory Visit: Payer: Self-pay

## 2016-08-09 NOTE — Patient Outreach (Signed)
[place] [facility]) Care Management  08/09/2016  EMORY LEAVER [birth year] [id number]    TelephonicCare Coordination Services   Issue:  Appointment verification; patient does not have appointment information.  Office contact verifies the following:   Appointment:  Tuesday, 08/13/16 arrive at 1:00 pm for 1:15 pm appointment time. [place], [place] Referral from [facility] 08/13/16  [address]  [place]  [place], [place] [address] [phone]  Plan:  RN CM will notify Patient of this update.   Nathaneil Canary, BSN, RN, [place] Management Care Management Coordinator [phone] Direct [phone] Cell [phone] Office [phone] Fax [email or web address]

## 2016-08-09 NOTE — Patient Outreach (Signed)
[place] [facility]) Care Management  08/09/2016  Jamie Montoya 03-14-1946 [id number]   TelephonicCare Coordination Services   Issue:  Appointment verification; patient does not have appointment information.  Office contact verifies the following:   [facility] Monday Jamie Montoya, [place]. [address] [place]  [address] [phone] Appointment:   Monday, 08/12/16 at 11:15  Plan:   RN CM will notify patient of this update.    Jamie Montoya, BSN, RN, [place] Care Management Care Management Coordinator [phone] Direct [phone] Cell [phone] Office [phone] Fax [email or web address]

## 2016-08-09 NOTE — Patient Outreach (Signed)
[place] [facility]) Care Management  08/09/2016  CHARMION HAPKE Oct 21, 1945 [id number]   Telephonic Care Coordination Services  Outreach call to patient to provide update of verified MD appt's, locations and phone contact numbers.  Patient confirms daughter will provide transportation.   RN CM encouraged to adhere to appt's.   Nathaneil Canary, BSN, RN, [place] Management Care Management Coordinator [phone] Direct [phone] Cell [phone] Office [phone] Fax [email or web address]

## 2016-08-12 DIAGNOSIS — Z79899 Other long term (current) drug therapy: Secondary | ICD-10-CM | POA: Diagnosis not present

## 2016-08-12 DIAGNOSIS — J9620 Acute and chronic respiratory failure, unspecified whether with hypoxia or hypercapnia: Secondary | ICD-10-CM | POA: Diagnosis not present

## 2016-08-12 DIAGNOSIS — I5020 Unspecified systolic (congestive) heart failure: Secondary | ICD-10-CM | POA: Diagnosis not present

## 2016-08-12 DIAGNOSIS — I8310 Varicose veins of unspecified lower extremity with inflammation: Secondary | ICD-10-CM | POA: Diagnosis not present

## 2016-08-12 DIAGNOSIS — R600 Localized edema: Secondary | ICD-10-CM | POA: Diagnosis not present

## 2016-08-13 ENCOUNTER — Ambulatory Visit (INDEPENDENT_AMBULATORY_CARE_PROVIDER_SITE_OTHER): Payer: Medicare Other | Admitting: Physician Assistant

## 2016-08-13 ENCOUNTER — Encounter: Payer: Self-pay | Admitting: Physician Assistant

## 2016-08-13 ENCOUNTER — Other Ambulatory Visit: Payer: Self-pay | Admitting: *Deleted

## 2016-08-13 ENCOUNTER — Telehealth: Payer: Self-pay | Admitting: *Deleted

## 2016-08-13 VITALS — BP 128/64 | HR 76 | Ht 63.5 in | Wt 268.0 lb

## 2016-08-13 DIAGNOSIS — J449 Chronic obstructive pulmonary disease, unspecified: Secondary | ICD-10-CM

## 2016-08-13 DIAGNOSIS — R002 Palpitations: Secondary | ICD-10-CM

## 2016-08-13 DIAGNOSIS — I5032 Chronic diastolic (congestive) heart failure: Secondary | ICD-10-CM | POA: Diagnosis not present

## 2016-08-13 DIAGNOSIS — I872 Venous insufficiency (chronic) (peripheral): Secondary | ICD-10-CM | POA: Diagnosis not present

## 2016-08-13 DIAGNOSIS — E876 Hypokalemia: Secondary | ICD-10-CM

## 2016-08-13 MED ORDER — CARVEDILOL 12.5 MG PO TABS: 12.5000 mg | ORAL_TABLET | Freq: Two times a day (BID) | ORAL | 3 refills | Status: DC

## 2016-08-13 MED ORDER — METOLAZONE 5 MG PO TABS: 5.0000 mg | ORAL_TABLET | ORAL | 2 refills | Status: DC

## 2016-08-13 MED ORDER — METOLAZONE 2.5 MG PO TABS: 2.5000 mg | ORAL_TABLET | ORAL | Status: DC

## 2016-08-13 NOTE — Patient Instructions (Signed)
Your physician has recommended you make the following change in your medication:  INCREASE  CARVEDILOL TO  12.5 MG  TWICE DAILY METOLAZONE  5 MG  1 TAB  3  TIMES   PER  WEEK   Your physician has recommended that you wear an event monitor. Event monitors are medical devices that record the heart's electrical activity. Doctors most often [place] these monitors to diagnose arrhythmias. Arrhythmias are problems with the speed or rhythm of the heartbeat. The monitor is a small, portable device. You can wear one while you do your normal daily activities. This is usually used to diagnose what is causing palpitations/syncope (passing out).    Your physician recommends that you schedule a follow-up appointment in:  [place]

## 2016-08-13 NOTE — Telephone Encounter (Signed)
ADDITIONAL  MED  CHANGES  PER  RECENT LABS   DECREASE  ZAROXOLYN 2.5  MG  2 -3  WEEK  INSTEAD  OF  5 MG     AND NEEDS  AN  EXTRA  KLOR CON  TODAY   AND  REPEAT  BMET ON   08-19-16 WHILE HERE  FOR MONITOR  .Adonis Housekeeper
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
## 2016-08-13 NOTE — Telephone Encounter (Signed)
Per Devra Dopp, RN, called pt to let her know that Ermalinda Barrios, PA-C, received her lab results from her pcp and Selinda Eon wants pt to decrease the Zaroxolyn to 2.25 mg 2-3 X a week instead of 5 mg and for pt to take a extra K+ today and have BMET repeated 08/19/16.  Pt has been made aware of the above and verbalized understanding and appreciation.

## 2016-08-14 ENCOUNTER — Telehealth: Payer: Self-pay | Admitting: Physician Assistant

## 2016-08-14 NOTE — Telephone Encounter (Signed)
New message      Pt c/o medication issue:  1. Name of Medication:  potassium 2. How are you currently taking this medication (dosage and times per day)? 20 meq 3. Are you having a reaction (difficulty breathing--STAT)?  no  4. What is your medication issue?  Calling to get clarification on dosage

## 2016-08-14 NOTE — Telephone Encounter (Signed)
Returned pts call.  She needed to clarify the K+ dose.  Pt was instructed to take extra tablet 08/13/16 and pt wanted to make sure that we didn't mean everyday.  Pt was advised only yesterday was she to take a extra dose. Pt verbalized understanding.

## 2016-08-16 ENCOUNTER — Other Ambulatory Visit: Payer: Self-pay

## 2016-08-16 NOTE — Patient Outreach (Signed)
Patient triggered Red on EMMI heart failure dashboard, notification sent to [name], RN and Sherrin Daisy, RN

## 2016-08-16 NOTE — Patient Outreach (Signed)
[name]) Care Management  08/16/2016  Jamie Montoya 1945/07/03 [id number]   REFERRAL DATE: 08/16/16 REFERRAL SOURCE: EMMI heart failure referral REFERRAL REASON: EMMI heart failure red alert for: Know exactly which meds to take? NO  SUBJECTIVE;  Telephone call to patient regarding EMMI heart failure red alert. HIPAA verified with patient. Patient states she is not having any problems with her medications. Patient states she has all of her medications and is taking her medications as prescribed.  Patient states she has a follow up appointment scheduled with her cardiologist for Monday 08/19/16.  Patient reports she saw her primary MD on 08/05/16. Patient reports new symptoms:  Patient states she is only dribbling when she urinates. States she is going more frequently and doesn't feel she is emptying her bladder completely. Patient states she has been having these symptoms for about a week. RNCM advised patient she should call her primary MD on today to notify them of these new symptoms. Patient states she was seen by the home health nurse today.  States she told the home health nurse about the symptoms. Patient states her home health nurse said she would call her primary MD and notify them of the symptoms.  RNCM advised patient to call primary MD office if she does not hear from them before the end of business today to report these new onset of symptoms. Patient verbalized understanding.   ASSESSEMENT: EMMI heart failure red alert. New onset of symptoms.   PLAN;  Patient will notify her primary MD of new urinary symptoms today as advised by [name]  RNCM will follow up with patient within  2 weeks.   [name] RN,BSN,CCM [facility] Telephonic  [phone]

## 2016-08-19 ENCOUNTER — Observation Stay (HOSPITAL_COMMUNITY)
Admission: EM | Admit: 2016-08-19 | Discharge: 2016-08-21 | Disposition: A | Discharge status: Home Health | Payer: Medicare Other | Attending: Cardiology | Admitting: Cardiology

## 2016-08-19 ENCOUNTER — Other Ambulatory Visit: Payer: Self-pay | Admitting: Physician Assistant

## 2016-08-19 ENCOUNTER — Encounter (HOSPITAL_COMMUNITY): Payer: Self-pay | Admitting: *Deleted

## 2016-08-19 ENCOUNTER — Telehealth: Payer: Self-pay | Admitting: Physician Assistant

## 2016-08-19 ENCOUNTER — Other Ambulatory Visit: Payer: Medicare Other | Admitting: *Deleted

## 2016-08-19 ENCOUNTER — Emergency Department (HOSPITAL_COMMUNITY): Payer: Medicare Other

## 2016-08-19 ENCOUNTER — Ambulatory Visit (INDEPENDENT_AMBULATORY_CARE_PROVIDER_SITE_OTHER): Payer: Medicare Other

## 2016-08-19 DIAGNOSIS — K5180 Other ulcerative colitis without complications: Secondary | ICD-10-CM | POA: Insufficient documentation

## 2016-08-19 DIAGNOSIS — I5032 Chronic diastolic (congestive) heart failure: Secondary | ICD-10-CM | POA: Diagnosis not present

## 2016-08-19 DIAGNOSIS — I471 Supraventricular tachycardia, unspecified: Secondary | ICD-10-CM

## 2016-08-19 DIAGNOSIS — Z79899 Other long term (current) drug therapy: Secondary | ICD-10-CM | POA: Insufficient documentation

## 2016-08-19 DIAGNOSIS — I509 Heart failure, unspecified: Secondary | ICD-10-CM | POA: Diagnosis not present

## 2016-08-19 DIAGNOSIS — D649 Anemia, unspecified: Secondary | ICD-10-CM | POA: Diagnosis not present

## 2016-08-19 DIAGNOSIS — E876 Hypokalemia: Secondary | ICD-10-CM | POA: Diagnosis not present

## 2016-08-19 DIAGNOSIS — I872 Venous insufficiency (chronic) (peripheral): Secondary | ICD-10-CM | POA: Diagnosis not present

## 2016-08-19 DIAGNOSIS — J449 Chronic obstructive pulmonary disease, unspecified: Secondary | ICD-10-CM | POA: Diagnosis present

## 2016-08-19 DIAGNOSIS — Z9981 Dependence on supplemental oxygen: Secondary | ICD-10-CM | POA: Insufficient documentation

## 2016-08-19 DIAGNOSIS — R002 Palpitations: Secondary | ICD-10-CM

## 2016-08-19 DIAGNOSIS — E039 Hypothyroidism, unspecified: Secondary | ICD-10-CM | POA: Diagnosis present

## 2016-08-19 DIAGNOSIS — Z87891 Personal history of nicotine dependence: Secondary | ICD-10-CM | POA: Diagnosis not present

## 2016-08-19 DIAGNOSIS — R000 Tachycardia, unspecified: Secondary | ICD-10-CM | POA: Diagnosis present

## 2016-08-19 DIAGNOSIS — Z6841 Body Mass Index (BMI) 40.0 and over, adult: Secondary | ICD-10-CM | POA: Insufficient documentation

## 2016-08-19 DIAGNOSIS — E785 Hyperlipidemia, unspecified: Secondary | ICD-10-CM | POA: Diagnosis not present

## 2016-08-19 DIAGNOSIS — K5190 Ulcerative colitis, unspecified, without complications: Secondary | ICD-10-CM | POA: Diagnosis present

## 2016-08-19 HISTORY — DX: Morbid (severe) obesity due to excess calories: E66.01

## 2016-08-19 HISTORY — DX: Chronic diastolic (congestive) heart failure: I50.32

## 2016-08-19 HISTORY — DX: Supraventricular tachycardia: I47.1

## 2016-08-19 HISTORY — DX: Noninfective gastroenteritis and colitis, unspecified: K52.9

## 2016-08-19 HISTORY — DX: Supraventricular tachycardia, unspecified: I47.10

## 2016-08-19 HISTORY — DX: Edema, unspecified: R60.9

## 2016-08-19 HISTORY — DX: Hypokalemia: E87.6

## 2016-08-19 LAB — BASIC METABOLIC PANEL
ANION GAP: 16 — AB (ref 5–15)
BUN / CREAT RATIO: 32 — AB (ref 12–28)
BUN: 42 mg/dL — ABNORMAL HIGH (ref 8–27)
BUN: 43 mg/dL — ABNORMAL HIGH (ref 6–20)
CALCIUM: 9.3 mg/dL (ref 8.9–10.3)
CHLORIDE: 82 mmol/L — AB (ref 101–111)
CO2: 30 mmol/L — ABNORMAL HIGH (ref 18–29)
CO2: 33 mmol/L — AB (ref 22–32)
CREATININE: 1.3 mg/dL — AB (ref 0.57–1.00)
Calcium: 9.2 mg/dL (ref 8.7–10.3)
Chloride: 83 mmol/L — ABNORMAL LOW (ref 96–106)
Creatinine, Ser: 1.42 mg/dL — ABNORMAL HIGH (ref 0.44–1.00)
GFR calc Af Amer: 47 mL/min/{1.73_m2} — ABNORMAL LOW (ref >59)
GFR calc non Af Amer: 36 mL/min — ABNORMAL LOW (ref >60)
GFR, EST AFRICAN AMERICAN: 42 mL/min — AB (ref >60)
GFR, EST NON AFRICAN AMERICAN: 41 mL/min/{1.73_m2} — AB (ref >59)
Glucose, Bld: 122 mg/dL — ABNORMAL HIGH (ref 65–99)
Glucose: 103 mg/dL — ABNORMAL HIGH (ref 65–99)
POTASSIUM: 2.7 mmol/L — AB (ref 3.5–5.2)
Potassium: 2.4 mmol/L — CL (ref 3.5–5.1)
SODIUM: 134 mmol/L (ref 134–144)
SODIUM: 131 mmol/L — AB (ref 135–145)

## 2016-08-19 LAB — CBC
HCT: 32.8 % — ABNORMAL LOW (ref 36.0–46.0)
HEMOGLOBIN: 10.4 g/dL — AB (ref 12.0–15.0)
MCH: 31.2 pg (ref 26.0–34.0)
MCHC: 31.7 g/dL (ref 30.0–36.0)
MCV: 98.5 fL (ref 78.0–100.0)
Platelets: 199 10*3/uL (ref 150–400)
RBC: 3.33 MIL/uL — AB (ref 3.87–5.11)
RDW: 13.9 % (ref 11.5–15.5)
WBC: 6.9 10*3/uL (ref 4.0–10.5)

## 2016-08-19 LAB — I-STAT TROPONIN, ED: TROPONIN I, POC: 0.01 ng/mL (ref 0.00–0.08)

## 2016-08-19 LAB — POTASSIUM: Potassium: 2.9 mmol/L — ABNORMAL LOW (ref 3.5–5.1)

## 2016-08-19 IMAGING — DX DG CHEST 2V
2 series · 2 of 2 positions shown · non-contrast
Comparison: Chest x-ray dated [DATE].

CLINICAL DATA: Increased heart rate. Recent admission for CHF and
leg swelling.

EXAM:
CHEST  2 VIEW

[w chest pa]
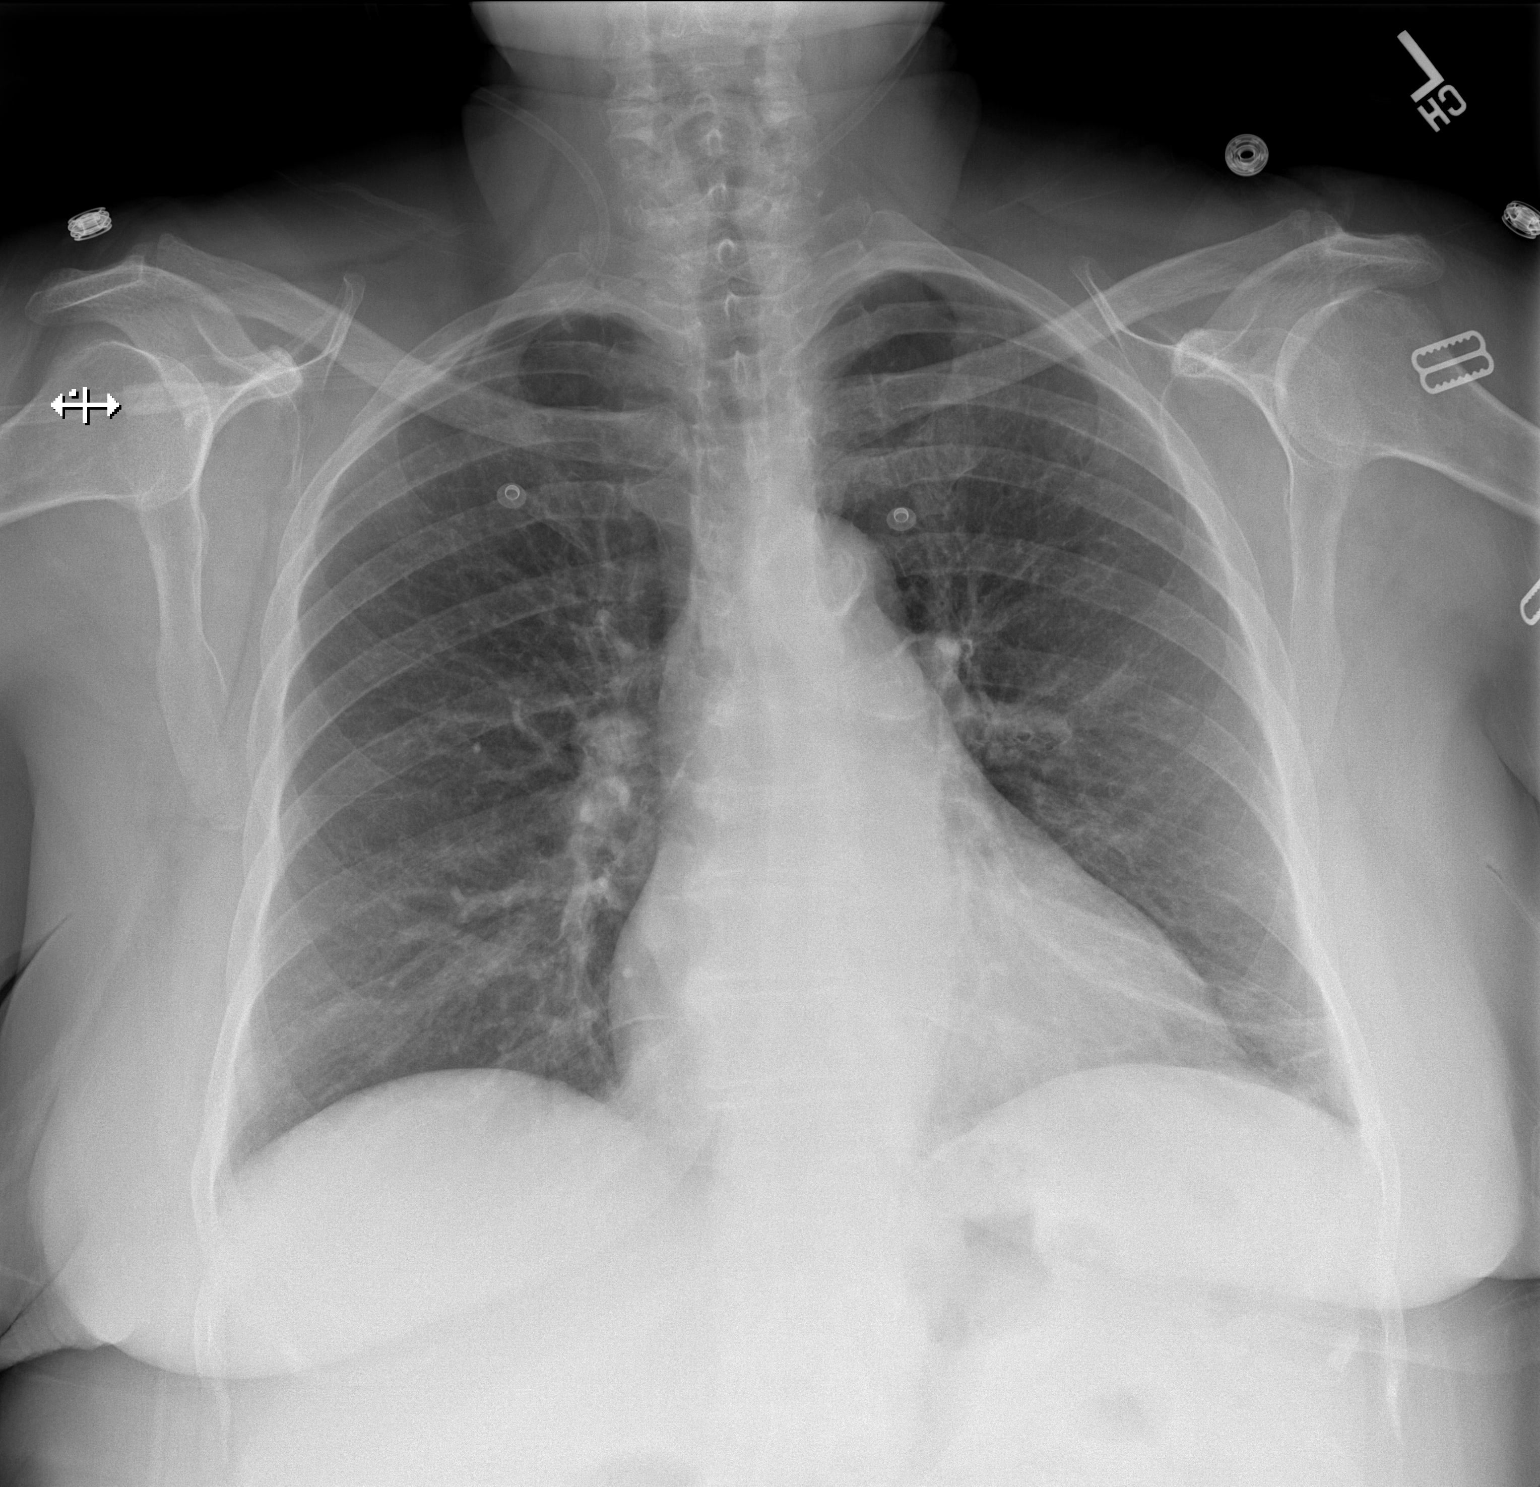

[w chest lat]
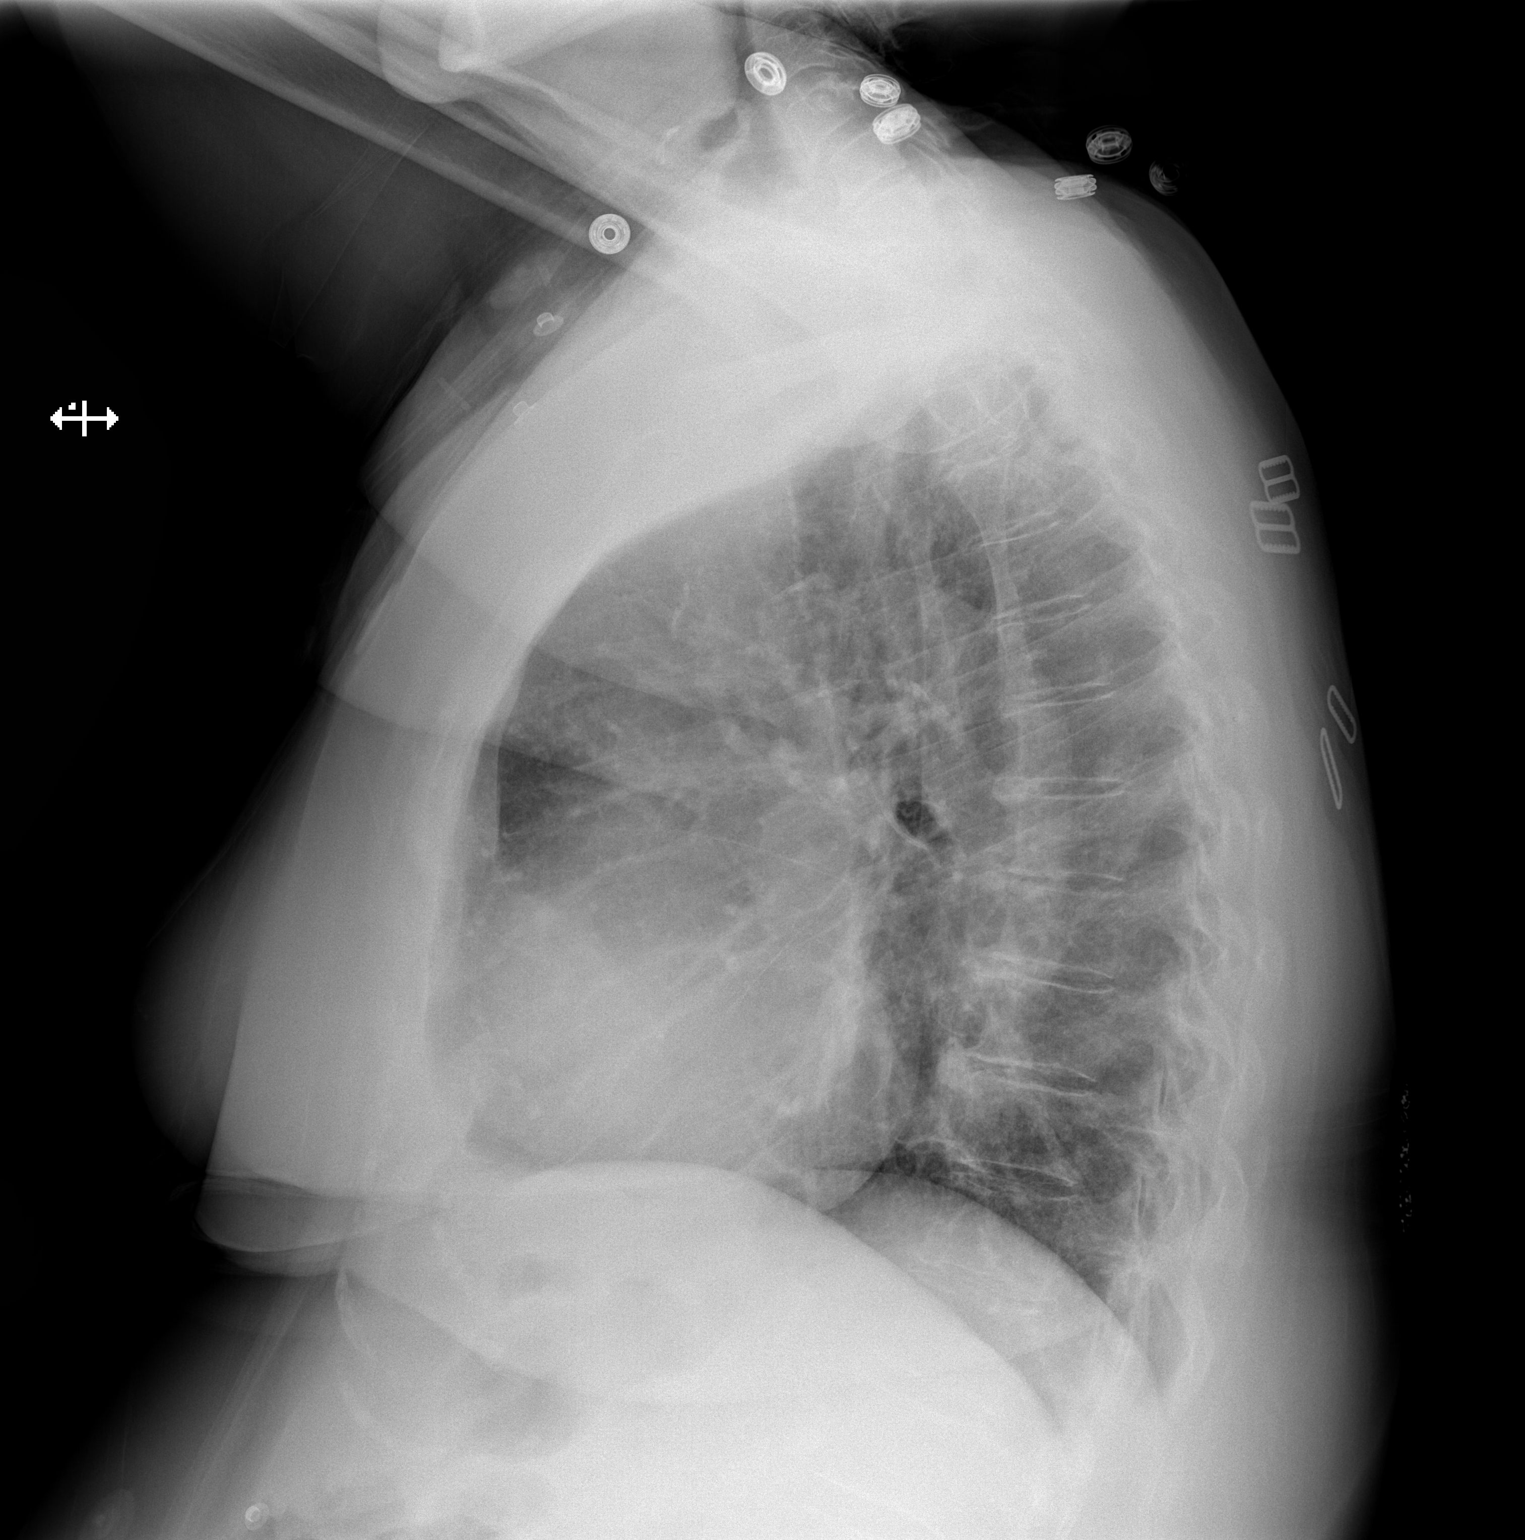

[2 of 2 positions shown; findings below may reference images not displayed]

FINDINGS: Mild cardiomegaly is stable or perhaps slightly improved.
Atherosclerotic changes again noted at the aortic arch. Probable
mild scarring at the left lung base. Lungs otherwise clear. No
pleural effusion or pneumothorax seen.

Mild degenerative spurring within the thoracic spine. No acute or
suspicious osseous finding.
IMPRESSION: No active cardiopulmonary disease. No evidence of pneumonia or
pulmonary edema. No evidence of active CHF.

Aortic atherosclerosis.

## 2016-08-19 MED ORDER — ALBUTEROL SULFATE (2.5 MG/3ML) 0.083% IN NEBU: 3.0000 mL | INHALATION_SOLUTION | Freq: Four times a day (QID) | RESPIRATORY_TRACT | Status: DC | PRN

## 2016-08-19 MED ORDER — LEVOTHYROXINE SODIUM 100 MCG PO TABS
300.0000 ug | ORAL_TABLET | Freq: Every day | ORAL | Status: DC
  Administered 2016-08-20 – 2016-08-21 (×2): 300 ug via ORAL
  Filled 2016-08-19 (×2): qty 3

## 2016-08-19 MED ORDER — HEPARIN SODIUM (PORCINE) 5000 UNIT/ML IJ SOLN
5000.0000 [IU] | Freq: Three times a day (TID) | INTRAMUSCULAR | Status: DC
  Administered 2016-08-19 – 2016-08-21 (×5): 5000 [IU] via SUBCUTANEOUS
  Filled 2016-08-19 (×5): qty 1

## 2016-08-19 MED ORDER — BIOTIN 5000 MCG PO TABS: 1.0000 | ORAL_TABLET | Freq: Every day | ORAL | Status: DC

## 2016-08-19 MED ORDER — SPIRONOLACTONE 25 MG PO TABS
25.0000 mg | ORAL_TABLET | Freq: Every day | ORAL | Status: DC
  Administered 2016-08-20: 25 mg via ORAL
  Filled 2016-08-19: qty 1

## 2016-08-19 MED ORDER — ASPIRIN EC 81 MG PO TBEC
81.0000 mg | DELAYED_RELEASE_TABLET | Freq: Every day | ORAL | Status: DC
  Administered 2016-08-19 – 2016-08-20 (×2): 81 mg via ORAL
  Filled 2016-08-19 (×2): qty 1

## 2016-08-19 MED ORDER — CHOLESTYRAMINE 4 G PO PACK
4.0000 g | PACK | Freq: Two times a day (BID) | ORAL | Status: DC
  Administered 2016-08-20 (×3): 4 g via ORAL
  Filled 2016-08-19 (×5): qty 1

## 2016-08-19 MED ORDER — FUROSEMIDE 40 MG PO TABS: 40.0000 mg | ORAL_TABLET | ORAL | Status: DC

## 2016-08-19 MED ORDER — PROBIOTIC PO CAPS: 1.0000 | ORAL_CAPSULE | Freq: Every day | ORAL | Status: DC

## 2016-08-19 MED ORDER — SULFASALAZINE 500 MG PO TABS
2000.0000 mg | ORAL_TABLET | Freq: Two times a day (BID) | ORAL | Status: DC
Start: 2016-08-19 — End: 2016-08-21
  Administered 2016-08-19 – 2016-08-21 (×4): 2000 mg via ORAL
  Filled 2016-08-19 (×4): qty 4

## 2016-08-19 MED ORDER — POTASSIUM CHLORIDE 2 MEQ/ML IV SOLN
30.0000 meq | Freq: Once | INTRAVENOUS | Status: AC
  Administered 2016-08-19: 30 meq via INTRAVENOUS
  Filled 2016-08-19: qty 15

## 2016-08-19 MED ORDER — PRAVASTATIN SODIUM 20 MG PO TABS
20.0000 mg | ORAL_TABLET | Freq: Every day | ORAL | Status: DC
  Administered 2016-08-19 – 2016-08-20 (×2): 20 mg via ORAL
  Filled 2016-08-19 (×2): qty 1

## 2016-08-19 MED ORDER — SODIUM CHLORIDE 0.9% FLUSH
3.0000 mL | Freq: Two times a day (BID) | INTRAVENOUS | Status: DC
  Administered 2016-08-20 (×2): 3 mL via INTRAVENOUS

## 2016-08-19 MED ORDER — FUROSEMIDE 80 MG PO TABS
80.0000 mg | ORAL_TABLET | ORAL | Status: DC
  Administered 2016-08-20 – 2016-08-21 (×2): 80 mg via ORAL
  Filled 2016-08-19 (×2): qty 1

## 2016-08-19 MED ORDER — CARVEDILOL 12.5 MG PO TABS
12.5000 mg | ORAL_TABLET | Freq: Two times a day (BID) | ORAL | Status: DC
  Administered 2016-08-19 – 2016-08-21 (×4): 12.5 mg via ORAL
  Filled 2016-08-19 (×4): qty 1

## 2016-08-19 MED ORDER — FUROSEMIDE 40 MG PO TABS: 40.0000 mg | ORAL_TABLET | Freq: Every day | ORAL | Status: DC

## 2016-08-19 MED ORDER — LORAZEPAM 1 MG PO TABS: 1.0000 mg | ORAL_TABLET | Freq: Three times a day (TID) | ORAL | Status: DC | PRN

## 2016-08-19 MED ORDER — CARVEDILOL 12.5 MG PO TABS: 12.5000 mg | ORAL_TABLET | Freq: Two times a day (BID) | ORAL | Status: DC

## 2016-08-19 MED ORDER — SACCHAROMYCES BOULARDII 250 MG PO CAPS
250.0000 mg | ORAL_CAPSULE | Freq: Two times a day (BID) | ORAL | Status: DC
  Administered 2016-08-19 – 2016-08-21 (×4): 250 mg via ORAL
  Filled 2016-08-19 (×4): qty 1

## 2016-08-19 MED ORDER — POTASSIUM CHLORIDE CRYS ER 20 MEQ PO TBCR
40.0000 meq | EXTENDED_RELEASE_TABLET | Freq: Every day | ORAL | Status: DC
  Administered 2016-08-20: 40 meq via ORAL
  Administered 2016-08-21: 20 meq via ORAL
  Filled 2016-08-19 (×3): qty 2

## 2016-08-19 MED ORDER — POTASSIUM CHLORIDE CRYS ER 20 MEQ PO TBCR
40.0000 meq | EXTENDED_RELEASE_TABLET | Freq: Once | ORAL | Status: AC
  Administered 2016-08-19: 40 meq via ORAL
  Filled 2016-08-19: qty 2
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
## 2016-08-19 NOTE — Telephone Encounter (Signed)
I spoke with Crystal at [facility] and follow up recording not available yet. Monitor strips and lab results reviewed with Dr. Caryl Comes.  Dr. Caryl Comes recommends pt go to ED at [facility].  OK for someone to transport her. I spoke with pt and gave her recommendation from Dr. Caryl Comes to go to ED.  She is hesitant to go but did agree to go.  Will have her daughter transport her. Trish notified. Will fax strips from [employer] to [place].  Fax [phone]

## 2016-08-19 NOTE — ED Notes (Signed)
Report attempted 

## 2016-08-19 NOTE — Telephone Encounter (Signed)
I spoke with Jamie Montoya at [employer]. Monitor showed SVT at 183 beats per minute. Reading ended with elevated heart rate. No additional recordings.  They have not been able to reach pt. I spoke with pt. She had monitor applied today.  She was not aware of fast heart rate or palpitations.  She reports she did have diarrhea when she got home today and was rushing to the bathroom. I asked her to press button and send recording at this time.

## 2016-08-19 NOTE — Telephone Encounter (Signed)
Crystal with [employer] is calling to report a "serious EKG." Crystal also stated that she will upload to the web and it can be viewed at your earliest convenience. If you have any questions, Crystal can be reached at [phone] extension [phone]. Thanks.

## 2016-08-19 NOTE — ED Triage Notes (Signed)
Pt reports being sent here by her cardiologist for increased hr. Pt states that she was placed on a heart monitor today at 1pm. Pt states that she was called and told to come here due to increased hr and low potassium. Pt states that he was recently admitted for chf and leg swelling

## 2016-08-20 DIAGNOSIS — E876 Hypokalemia: Secondary | ICD-10-CM | POA: Diagnosis not present

## 2016-08-20 DIAGNOSIS — I872 Venous insufficiency (chronic) (peripheral): Secondary | ICD-10-CM | POA: Diagnosis not present

## 2016-08-20 DIAGNOSIS — I471 Supraventricular tachycardia: Secondary | ICD-10-CM | POA: Diagnosis not present

## 2016-08-20 DIAGNOSIS — I5032 Chronic diastolic (congestive) heart failure: Secondary | ICD-10-CM | POA: Diagnosis not present

## 2016-08-20 LAB — BASIC METABOLIC PANEL
ANION GAP: 10 (ref 5–15)
Anion gap: 8 (ref 5–15)
BUN: 37 mg/dL — ABNORMAL HIGH (ref 6–20)
BUN: 32 mg/dL — ABNORMAL HIGH (ref 6–20)
CHLORIDE: 90 mmol/L — AB (ref 101–111)
CO2: 35 mmol/L — ABNORMAL HIGH (ref 22–32)
CO2: 36 mmol/L — ABNORMAL HIGH (ref 22–32)
Calcium: 9 mg/dL (ref 8.9–10.3)
Calcium: 9.6 mg/dL (ref 8.9–10.3)
Chloride: 91 mmol/L — ABNORMAL LOW (ref 101–111)
Creatinine, Ser: 1.19 mg/dL — ABNORMAL HIGH (ref 0.44–1.00)
Creatinine, Ser: 1.23 mg/dL — ABNORMAL HIGH (ref 0.44–1.00)
GFR calc Af Amer: 52 mL/min — ABNORMAL LOW (ref >60)
GFR calc non Af Amer: 44 mL/min — ABNORMAL LOW (ref >60)
GFR calc non Af Amer: 43 mL/min — ABNORMAL LOW (ref >60)
GFR, EST AFRICAN AMERICAN: 50 mL/min — AB (ref >60)
Glucose, Bld: 91 mg/dL (ref 65–99)
Glucose, Bld: 95 mg/dL (ref 65–99)
POTASSIUM: 3.1 mmol/L — AB (ref 3.5–5.1)
Potassium: 2.8 mmol/L — ABNORMAL LOW (ref 3.5–5.1)
Sodium: 134 mmol/L — ABNORMAL LOW (ref 135–145)
Sodium: 136 mmol/L (ref 135–145)

## 2016-08-20 LAB — CBC
HEMATOCRIT: 32.9 % — AB (ref 36.0–46.0)
HEMOGLOBIN: 10 g/dL — AB (ref 12.0–15.0)
MCH: 30.4 pg (ref 26.0–34.0)
MCHC: 30.4 g/dL (ref 30.0–36.0)
MCV: 100 fL (ref 78.0–100.0)
Platelets: 192 10*3/uL (ref 150–400)
RBC: 3.29 MIL/uL — ABNORMAL LOW (ref 3.87–5.11)
RDW: 14.2 % (ref 11.5–15.5)
WBC: 4.6 10*3/uL (ref 4.0–10.5)

## 2016-08-20 LAB — MAGNESIUM
MAGNESIUM: 2 mg/dL (ref 1.7–2.4)
Magnesium: 1.7 mg/dL (ref 1.7–2.4)

## 2016-08-20 LAB — TSH: TSH: 0.861 u[IU]/mL (ref 0.350–4.500)

## 2016-08-20 MED ORDER — POTASSIUM CHLORIDE CRYS ER 20 MEQ PO TBCR
20.0000 meq | EXTENDED_RELEASE_TABLET | Freq: Once | ORAL | Status: AC
  Administered 2016-08-20: 20 meq via ORAL
  Filled 2016-08-20: qty 1

## 2016-08-20 MED ORDER — SODIUM CHLORIDE 0.9 % IV SOLN
30.0000 meq | Freq: Once | INTRAVENOUS | Status: AC
  Administered 2016-08-20: 30 meq via INTRAVENOUS
  Filled 2016-08-20: qty 15

## 2016-08-20 MED ORDER — POTASSIUM CHLORIDE 2 MEQ/ML IV SOLN
30.0000 meq | Freq: Once | INTRAVENOUS | Status: AC
  Administered 2016-08-20: 30 meq via INTRAVENOUS
  Filled 2016-08-20: qty 15

## 2016-08-20 MED ORDER — POTASSIUM CHLORIDE CRYS ER 20 MEQ PO TBCR
40.0000 meq | EXTENDED_RELEASE_TABLET | Freq: Once | ORAL | Status: AC
  Administered 2016-08-20: 40 meq via ORAL
  Filled 2016-08-20: qty 2

## 2016-08-20 NOTE — Progress Notes (Signed)
Progress Note  Patient Name: Jamie Montoya Date of Encounter: 08/20/2016  Primary Cardiologist: Sherman   Patient says that her legs look "10-20x better" than they did before recent admission. Still markedly edematous but she states she is feeling at her baseline. Denies CP, SOB. She does report a h/o palpitations but has not felt her heart speed up here.   K 2.4 -> KCl 58mq PO + 330m IV x 2 -> K 2.8 at 0157 -> KCl 3034mIV + KCl 79m51mO -> needs repeat potassium this AM (morning BMET drawn much earlier than ordered). She does admit to some leg cramping as well as abdominal cramping but the abd cramping is known for her.  Inpatient Medications    Scheduled Meds: . aspirin EC  81 mg Oral Daily  . carvedilol  12.5 mg Oral BID WC  . cholestyramine  4 g Oral Q12H  . furosemide  40 mg Oral q1800  . furosemide  80 mg Oral Q24H  . heparin  5,000 Units Subcutaneous Q8H  . levothyroxine  300 mcg Oral QAC breakfast  . potassium chloride  40 mEq Oral Daily  . pravastatin  20 mg Oral QHS  . saccharomyces boulardii  250 mg Oral BID  . sodium chloride flush  3 mL Intravenous Q12H  . spironolactone  25 mg Oral Daily  . sulfaSALAzine  2,000 mg Oral BID   Continuous Infusions:  PRN Meds: albuterol, LORazepam   Vital Signs    Vitals:   08/19/16 2100 08/19/16 2120 08/20/16 0518 08/20/16 0945  BP:  (!) 105/51 (!) 114/33 (!) 112/40  Pulse: (!) 127 (!) 114 77 97  Resp: 21 20 20 20   Temp:  98 F (36.7 C) 97.9 F (36.6 C) 97.7 F (36.5 C)  TempSrc:  Oral Oral Oral  SpO2: 100% 96% 96% 97%  Weight:  272 lb 14.9 oz (123.8 kg)    Height:  5' 3"  (1.6 m)      Intake/Output Summary (Last 24 hours) at 08/20/16 1336 Last data filed at 08/20/16 0958  Gross per 24 hour  Intake              480 ml  Output                1 ml  Net              479 ml   Filed Weights   08/19/16 1808 08/19/16 2120  Weight: 272 lb (123.4 kg) 272 lb 14.9 oz (123.8 kg)    Telemetry    NSR with  rare PVCs, frequent runs of an atrial tachycardia to be reviewed by MD as well - rate 120-150. Personally reviewed.  ECG    ?Sinus tach versus atrial tach of some kind with RBBB - 120bpm - Personally Reviewed  Physical Exam   GEN: No acute distress. Morbidly obese HEENT: Normocephalic, atraumatic, sclera non-icteric. Neck: No JVD or bruits. Cardiac: RRR no murmurs, rubs, or gallops.  Radials/DP/PT 1+ and equal bilaterally.  Respiratory: Clear to auscultation bilaterally. Breathing is unlabored. GI: Soft, nontender, non-distended, BS +x 4. MS: no deformity. Extremities: No clubbing or cyanosis. 2+ stiff chronic appearing edema with violaceous appearance indicative of chronic stasis dermatitis. Pt reports this is longstanding. Distal pedal pulses intact but difficult to palpate. Neuro:  AAOx3. Follows commands. Psych:  Responds to questions appropriately with a normal affect.  Labs    Chemistry Recent Labs Lab 08/19/16 1123 08/19/16 1812 08/19/16 2231  08/20/16 0157  NA 134 131*  --  134*  K 2.7* 2.4* 2.9* 2.8*  CL 83* 82*  --  91*  CO2 30* 33*  --  35*  GLUCOSE 103* 122*  --  91  BUN 42* 43*  --  37*  CREATININE 1.30* 1.42*  --  1.19*  CALCIUM 9.2 9.3  --  9.0  GFRNONAA 41* 36*  --  44*  GFRAA 47* 42*  --  52*  ANIONGAP  --  16*  --  8     Hematology Recent Labs Lab 08/19/16 1812  WBC 6.9  RBC 3.33*  HGB 10.4*  HCT 32.8*  MCV 98.5  MCH 31.2  MCHC 31.7  RDW 13.9  PLT 199    Cardiac EnzymesNo results for input(s): TROPONINI in the last 168 hours.  Recent Labs Lab 08/19/16 1821  TROPIPOC 0.01     BNPNo results for input(s): BNP, PROBNP in the last 168 hours.   DDimer No results for input(s): DDIMER in the last 168 hours.   Radiology    Dg Chest 2 View  Result Date: 08/19/2016 CLINICAL DATA:  Increased heart rate. Recent admission for CHF and leg swelling. EXAM: CHEST  2 VIEW COMPARISON:  Chest x-ray dated 07/30/2016. FINDINGS: Mild cardiomegaly is  stable or perhaps slightly improved. Atherosclerotic changes again noted at the aortic arch. Probable mild scarring at the left lung base. Lungs otherwise clear. No pleural effusion or pneumothorax seen. Mild degenerative spurring within the thoracic spine. No acute or suspicious osseous finding. IMPRESSION: No active cardiopulmonary disease. No evidence of pneumonia or pulmonary edema. No evidence of active CHF. Aortic atherosclerosis. Electronically Signed   By: Franki Cabot M.D.   On: 08/19/2016 19:20    Cardiac Studies   2D Echo 07/31/16 Study Conclusions - Left ventricle: The cavity size was mildly dilated. Systolic   function was normal. The estimated ejection fraction was in the   range of 60% to 65%. Wall motion was normal; there were no   regional wall motion abnormalities. Features are consistent with   a pseudonormal left ventricular filling pattern, with concomitant   abnormal relaxation and increased filling pressure (grade 2   diastolic dysfunction). - Aortic valve: Transvalvular velocity was within the normal range.   There was no stenosis. There was no regurgitation. - Mitral valve: Moderately calcified annulus. Transvalvular   velocity was within the normal range. There was no evidence for   stenosis. There was mild regurgitation. - Left atrium: The atrium was severely dilated. - Right ventricle: The cavity size was normal. Wall thickness was   normal. Systolic function was normal. - Right atrium: The atrium was moderately dilated. - Atrial septum: No defect or patent foramen ovale was identified   by color flow Doppler. - Tricuspid valve: There was mild regurgitation. - Pulmonary arteries: Systolic pressure was within the normal   range. PA peak pressure: 36 mm Hg (S).  Patient Profile     71 y.o. female with morbid obesity, chronic diastolic CHF, chronic lower extremity edema/venous stasis, COPD, chronic diarrhea from UC, hypothyroidism who was recently admitted for  worsening LEE and acute on chronic respiratory failure, diuresed for suspected CHF (BNP normal at 82). She diuresed from 283lb->274lb (deemed dry weight at discharge), sent home on Lasix 50m BID + metolazone 564mdaily. Seen as new patient in office 08/13/16 and was feeling poorly; Lasix increased to 8057mAM/10m35mM, Coreg increased, metolazone decreased to 3x/week. Monitor placed - reported to show  a run of SVT at 183. Labs revealed severe hypokalemia to 2.4. She was referred to the ER for further management.   Assessment & Plan    1. Severe hypokalemia - she has not had repeat K since she received an additional 70 mEq. The AM BMET was drawn several hours earlier than ordered. Will draw this along with magnesium now and reassess before prescribing further dosing.  2. Paroxysmal SVT - I will ask that Dr. Tamala Julian review telemetry. I am concerned she may have atrial flutter given abrupt intermittent nature and the heart rate. She would be at risk given her morbid obesity and electrolyte disturbances. Continue to replete K. Borderline soft BP makes AVN blocking titration less optimal.   3. Chronic diastolic CHF - based on her self-assessment and recent dry weight of 274lb, I believe she is at dry weight. There is some indication of weight getting down to 268 at one point but this was when she had acute kidney injury with elevated BUN/Cr so I do not think that should be our goal. Dr. Meda Coffee has added spironolactone to her Lasix. Follow volume. Reiterated low sodium diet and 2L fluid restriction.  4. Morbid obesity - would consider outpatient sleep study given her recent hypoxia and arrhythmias. Ultimately would benefit from overall body weight loss as well.   Signed, Charlie Pitter, PA-C  08/20/2016, 1:36 PM    The patient has been seen in conjunction with Melina Copa, PA-C. All aspects of care have been considered and discussed. The patient has been personally interviewed, examined, and all clinical data has  been reviewed.   I reviewed the tachycardia and it is difficult to decide if she is having ectopic atrial tachycardia or PSVT or some other variety of SVT. The rhythm is clearly a supraventricular rhythm anchored also be sinus tachycardia related to volume depletion.  Plan to continue repletion of potassium. She will gradually replete her intravascular volume status. We will continue to observe her rhythm. She may need to have an EP opinion if the rhythm does not resolve with euvolemic. We will do serial 12-lead EKGs

## 2016-08-20 NOTE — Progress Notes (Signed)
Per review with Dr. Tamala Julian, may be slightly dry. Holding PM dose of Lasix. Awaiting BMET results. Dr. Tamala Julian would like to follow arrhythmia on telemetry for now. If no improvement despite lyte repletion may need EP input to clarify. Dayna Dunn PA-C

## 2016-08-20 NOTE — Care Management Obs Status (Signed)
[place] NOTIFICATION   Patient Details  Name: TIOMBE TOMEO MRN: [id number] Date of Birth: 06-Nov-1945   Medicare Observation Status Notification Given:  Yes    Carles Collet, RN 08/20/2016, 9:34 AM

## 2016-08-21 ENCOUNTER — Telehealth: Payer: Self-pay | Admitting: *Deleted

## 2016-08-21 ENCOUNTER — Encounter (HOSPITAL_COMMUNITY): Payer: Self-pay | Admitting: Physician Assistant

## 2016-08-21 DIAGNOSIS — E876 Hypokalemia: Secondary | ICD-10-CM | POA: Diagnosis not present

## 2016-08-21 DIAGNOSIS — I872 Venous insufficiency (chronic) (peripheral): Secondary | ICD-10-CM | POA: Diagnosis not present

## 2016-08-21 DIAGNOSIS — I471 Supraventricular tachycardia: Secondary | ICD-10-CM | POA: Diagnosis not present

## 2016-08-21 LAB — BASIC METABOLIC PANEL
ANION GAP: 8 (ref 5–15)
BUN: 32 mg/dL — ABNORMAL HIGH (ref 6–20)
CHLORIDE: 94 mmol/L — AB (ref 101–111)
CO2: 33 mmol/L — AB (ref 22–32)
Calcium: 9.2 mg/dL (ref 8.9–10.3)
Creatinine, Ser: 1.2 mg/dL — ABNORMAL HIGH (ref 0.44–1.00)
GFR calc Af Amer: 51 mL/min — ABNORMAL LOW (ref >60)
GFR, EST NON AFRICAN AMERICAN: 44 mL/min — AB (ref >60)
GLUCOSE: 102 mg/dL — AB (ref 65–99)
Potassium: 3.8 mmol/L (ref 3.5–5.1)
Sodium: 135 mmol/L (ref 135–145)

## 2016-08-21 LAB — MAGNESIUM: Magnesium: 2 mg/dL (ref 1.7–2.4)

## 2016-08-21 LAB — CBC
HEMATOCRIT: 30.2 % — AB (ref 36.0–46.0)
HEMOGLOBIN: 9.2 g/dL — AB (ref 12.0–15.0)
MCH: 30.8 pg (ref 26.0–34.0)
MCHC: 30.5 g/dL (ref 30.0–36.0)
MCV: 101 fL — ABNORMAL HIGH (ref 78.0–100.0)
Platelets: 175 10*3/uL (ref 150–400)
RBC: 2.99 MIL/uL — AB (ref 3.87–5.11)
RDW: 14.4 % (ref 11.5–15.5)
WBC: 4 10*3/uL (ref 4.0–10.5)

## 2016-08-21 MED ORDER — METOLAZONE 2.5 MG PO TABS: 2.5000 mg | ORAL_TABLET | ORAL | 0 refills | Status: DC

## 2016-08-21 MED ORDER — POTASSIUM CHLORIDE CRYS ER 20 MEQ PO TBCR: 20.0000 meq | EXTENDED_RELEASE_TABLET | Freq: Two times a day (BID) | ORAL | 1 refills | Status: DC

## 2016-08-21 MED ORDER — FUROSEMIDE 40 MG PO TABS: 80.0000 mg | ORAL_TABLET | Freq: Every day | ORAL | 1 refills | Status: DC

## 2016-08-21 NOTE — Care Management Note (Signed)
Case Management Note Marvetta Gibbons RN, BSN Unit 2W-Case Manager [phone]  Patient Details  Name: Jamie Montoya MRN: [id number] Date of Birth: 1946-06-18  Subjective/Objective:  Pt presented with hypokalemia                  Action/Plan: PTA pt lived at home- has home 02 with [facility]- and was active with [facility] services for RN/PT/OT with [facility]- orders have been placed to resume [facility]- spoke with pt at bedside- pt states family will bring portable 02 tank from home for transport- pt also asking for info on list of potassium rich foods- will see if can find list on intranet to give to pt. Call made to [facility]- spoke with Tillie Rung- regarding resumption of [facility] services and pt discharge for today- resumption orders faxed to [phone] via Epic.   Expected Discharge Date:  08/21/16               Expected Discharge Plan:  [place]  In-House Referral:     Discharge planning Services  CM Consult  Post Acute Care Choice:  Home Health, Resumption of Svcs/PTA Provider Choice offered to:  Patient  DME Arranged:    DME Agency:     HH Arranged:  RN, PT, OT [place] Agency:  [facility]  Status of Service:  Completed, signed off  If discussed at [place] of Stay Meetings, dates discussed:    Discharge Disposition: home with home health   Additional Comments:  Dawayne Patricia, RN 08/21/2016, 10:07 AM

## 2016-08-21 NOTE — Discharge Summary (Signed)
The patient has been seen in conjunction with Melina Copa, PAC. All aspects of care have been considered and discussed. The patient has been personally interviewed, examined, and all clinical data has been reviewed.   Hypokalemia needs to be treated.  We held spironolactone. Will need to be restarted as OP. We decrease the metolazone to once a weak on Fridays.  Arranged 3-5 day follow-up for lab and further med adjustments as needed.  Discharge Summary    Patient ID: Jamie Montoya,  MRN: [id number], DOB/AGE: July 03, 1945 71 y.o.  Admit date: 08/19/2016 Discharge date: 08/21/2016  Primary Care Provider: [facility] Primary Cardiologist: Dr. Meda Coffee  Discharge Diagnoses    Principal Problem:   Paroxysmal SVT (supraventricular tachycardia) [facility]) Active Problems:   Morbid obesity (HCC)   COPD (chronic obstructive pulmonary disease) (HCC)   Ulcerative colitis, chronic (HCC)   HLD (hyperlipidemia)   Hypothyroidism   Chronic venous stasis dermatitis of both lower extremities   Hypokalemia   Chronic diastolic CHF (congestive heart failure) (HCC)    Diagnostic Studies/Procedures    N/A _____________     History of Present Illness     Jamie Montoya is a 71 y.o. female with history of morbid obesity, chronic diastolic CHF, chronic lower extremity edema/venous stasis, COPD, chronic diarrhea from UC, hypothyroidism, esophageal stricture, hyperlipidemia who was admitted for management of SVT and hypokalemia. She was recently admitted by internal medicine for worsening LEE and acute on chronic respiratory failure. She was diuresed for suspected CHF (BNP normal at 82). She diuresed from 283lb->274lb (deemed dry weight at discharge). She was sent home on Lasix 76m BID + metolazone 529mdaily. She was seen as new patient in office 08/13/16 and was feeling poorly. Lasix was increased to 8051mAM/47m38mM, Coreg was increased, and metolazone was decreased to 3x/week. She was also  reporting palpitations so a monitor was placed. Within a few hours of wearing it the office was notified for a run of SVT at 183bpm. She was advised to come to the ED where K was noted to be 2.4, BUN 43, Cr 1.42, Na 131.  Hospital Course    She was admitted for further monitoring. Metolazone was stopped completely. She was given addition of spironolactone instead. Her potassium was repleted - required several doses over the course of overnight and yesterday. This morning her potassium is 3.8. Mg 2.0. TSH wnl. Initially on telemetry she was noted to have an intermittent atrial tach which would abruptly jump to the 120-150 range. Dr. SmitTamala Julianiewed this and felt that it represented paroxysmal SVT. This resolved with repletion of her electrolytes. Dr. SmitTamala Julianommended to resume her outpatient monitor. Could conisder Consider further increasing carvedilol dose as an outpatient if tachycardia continues to be a problem. He recommends to decrease Lasix to 80mg71mly and resume metolazone at only once per week (2.5mg) 74mFridays. He also recommends KCl 20meq 60m He does not feel the patient needs spironolactone at present time. Will arrange TOC f/u in 3-5 days. She will need a BMET ordered at that visit.  Care management recommended to resume HHPT OT RN at discharge as she was previously enrolled in these services. She was advised to f/u PCP to monitor mild anemia this admission. Would consider arranging OP Sleep study at discharge if indicated.   Consultants: N/A  _____________  Discharge Vitals Blood pressure (!) 116/44, pulse 73, temperature 97.9 F (36.6 C), temperature source Oral, resp. rate 20, height 5' 3"  (1.6 m), weight  275 lb 3.2 oz (124.8 kg), SpO2 96 %.  Filed Weights   08/19/16 1808 08/19/16 2120 08/21/16 0435  Weight: 272 lb (123.4 kg) 272 lb 14.9 oz (123.8 kg) 275 lb 3.2 oz (124.8 kg)    Labs & Radiologic Studies    CBC  Recent Labs  08/20/16 1519 08/21/16 0248  WBC 4.6 4.0    HGB 10.0* 9.2*  HCT 32.9* 30.2*  MCV 100.0 101.0*  PLT 192 174   Basic Metabolic Panel  Recent Labs  08/20/16 1519 08/21/16 0248  NA 136 135  K 3.1* 3.8  CL 90* 94*  CO2 36* 33*  GLUCOSE 95 102*  BUN 32* 32*  CREATININE 1.23* 1.20*  CALCIUM 9.6 9.2  MG 2.0 2.0   Thyroid Function Tests  Recent Labs  08/20/16 1519  TSH 0.861   _____________  Dg Chest 2 View  Result Date: 08/19/2016 CLINICAL DATA:  Increased heart rate. Recent admission for CHF and leg swelling. EXAM: CHEST  2 VIEW COMPARISON:  Chest x-ray dated 07/30/2016. FINDINGS: Mild cardiomegaly is stable or perhaps slightly improved. Atherosclerotic changes again noted at the aortic arch. Probable mild scarring at the left lung base. Lungs otherwise clear. No pleural effusion or pneumothorax seen. Mild degenerative spurring within the thoracic spine. No acute or suspicious osseous finding. IMPRESSION: No active cardiopulmonary disease. No evidence of pneumonia or pulmonary edema. No evidence of active CHF. Aortic atherosclerosis. Electronically Signed   By: Franki Cabot M.D.   On: 08/19/2016 19:20   Dg Chest 2 View  Result Date: 07/30/2016 CLINICAL DATA:  Dyspnea. History of CHF and COPD. Lower extremity swelling. Abdominal distension. EXAM: CHEST  2 VIEW COMPARISON:  05/03/2015 chest radiograph. FINDINGS: Stable cardiomediastinal silhouette with mild cardiomegaly and aortic atherosclerosis. No pneumothorax. No pleural effusion. Borderline mild pulmonary edema. Linear opacities overlying the lower lobes on the lateral view. IMPRESSION: 1. Borderline mild congestive heart failure. 2. Linear opacities overlying the lower lobes on the lateral view, favor atelectasis. 3. Aortic atherosclerosis. Electronically Signed   By: Ilona Sorrel M.D.   On: 07/30/2016 17:23   Disposition   Pt is being discharged home today in good condition.  Follow-up Plans & Appointments    Follow-up Information    Dayna N Dunn, PA-C Follow up.    Specialties:  Cardiology, Radiology Why:  [facility] - 08/26/16 at 11:30am. Arrive at 11:15am to check in. Lisbeth Renshaw is one of the PAs that works closely with Dr. Francesca Oman care team. Contact information: [place] [address] [place] Follow up.   Why:  Your blood count demonstrated mild anemia (hemoglobin around 10). Please follow up with your primary care provider to monitor. Contact information: [place] [address] [phone]          Discharge Instructions    Diet - low sodium heart healthy    Complete by:  As directed    Increase activity slowly    Complete by:  As directed    Continue wearing your heart monitor for the remainder of the time duration.  Your furosemide (Lasix) was changed to 2 tablets once a day. Your potassium was changed to 1 tablet twice a day.  You should only take metolazone ONCE A WEEK on Fridays.      Discharge Medications   Allergies as of 08/21/2016      Reactions   Tape Rash  Medication List    TAKE these medications   albuterol (2.5 MG/3ML) 0.083% nebulizer solution Commonly known as:  PROVENTIL Take 3 mLs by nebulization 4 (four) times daily as needed for wheezing or shortness of breath.   Biotin 5000 MCG Tabs Take 1 tablet by mouth at bedtime.   carvedilol 12.5 MG tablet Commonly known as:  COREG Take 1 tablet (12.5 mg total) by mouth 2 (two) times daily with a meal.   cholestyramine 4 g packet Commonly known as:  QUESTRAN Take 4 g by mouth 2 (two) times daily.   diphenhydrAMINE-zinc acetate cream Commonly known as:  BENADRYL Apply topically 3 (three) times daily as needed for itching.   furosemide 40 MG tablet Commonly known as:  LASIX Take 2 tablets (80 mg total) by mouth daily. What changed:  how much to take  when to take this  additional instructions   levothyroxine 300 MCG tablet Commonly known as:   SYNTHROID, LEVOTHROID Take 300 mcg by mouth daily before breakfast.   metolazone 2.5 MG tablet Commonly known as:  ZAROXOLYN Take 1 tablet (2.5 mg total) by mouth once a week. On Fridays. What changed:  when to take this  additional instructions   potassium chloride SA 20 MEQ tablet Commonly known as:  K-DUR,KLOR-CON Take 1 tablet (20 mEq total) by mouth 2 (two) times daily. What changed:  when to take this   pravastatin 20 MG tablet Commonly known as:  PRAVACHOL Take 20 mg by mouth at bedtime.   sulfaSALAzine 500 MG tablet Commonly known as:  AZULFIDINE Take 4 tablets (2,000 mg total) by mouth 2 (two) times daily.        Allergies:  Allergies  Allergen Reactions  . Tape Rash    Outstanding Labs/Studies   Will need BMET  Duration of Discharge Encounter   Greater than 30 minutes including physician time.  Signed, Charlie Pitter PA-C 08/21/2016, 9:59 AM

## 2016-08-21 NOTE — Progress Notes (Signed)
Discharge instructions reviewed. Pt verbalized understanding. holter monitor placed on pt. Pt vu of medication changes. Pt to leave with daughter \. Questions answereed.

## 2016-08-21 NOTE — Progress Notes (Signed)
Progress Note  Patient Name: Jamie Montoya Date of Encounter: 08/21/2016  Primary Cardiologist: Meda Coffee  Subjective   Feels better this morning. Potassium is 3.8.  Inpatient Medications    Scheduled Meds: . aspirin EC  81 mg Oral Daily  . carvedilol  12.5 mg Oral BID WC  . cholestyramine  4 g Oral Q12H  . furosemide  80 mg Oral Q24H  . heparin  5,000 Units Subcutaneous Q8H  . levothyroxine  300 mcg Oral QAC breakfast  . potassium chloride  40 mEq Oral Daily  . pravastatin  20 mg Oral QHS  . saccharomyces boulardii  250 mg Oral BID  . sodium chloride flush  3 mL Intravenous Q12H  . spironolactone  25 mg Oral Daily  . sulfaSALAzine  2,000 mg Oral BID   Continuous Infusions:  PRN Meds: albuterol, LORazepam   Vital Signs    Vitals:   08/20/16 0945 08/20/16 1935 08/21/16 0435 08/21/16 0748  BP: (!) 112/40 (!) 91/26 (!) 139/52 (!) 116/44  Pulse: 97 64 64 73  Resp: 20 16 20    Temp: 97.7 F (36.5 C) 98.6 F (37 C) 97.9 F (36.6 C)   TempSrc: Oral Oral Oral   SpO2: 97% 93% 96%   Weight:   275 lb 3.2 oz (124.8 kg)   Height:        Intake/Output Summary (Last 24 hours) at 08/21/16 0836 Last data filed at 08/20/16 0272  Gross per 24 hour  Intake              480 ml  Output                0 ml  Net              480 ml   Filed Weights   08/19/16 1808 08/19/16 2120 08/21/16 0435  Weight: 272 lb (123.4 kg) 272 lb 14.9 oz (123.8 kg) 275 lb 3.2 oz (124.8 kg)    Telemetry    NSR with rare PVCs, frequent runs of an atrial tachycardia to be reviewed by MD as well - rate 120-150. Personally reviewed.  ECG    No new tracing  Physical Exam  Sitting at bedside with nightstand in front of patient. No distress. GEN: No acute distress. Morbidly obese HEENT: Normocephalic, atraumatic, sclera non-icteric. Neck: No JVD or bruits. Cardiac: RRR no murmurs, rubs, or gallops.  Radials/DP/PT 1+ and equal bilaterally.  Respiratory: Clear to auscultation bilaterally. Breathing  is unlabored. GI: Soft, nontender, non-distended, BS +x 4. MS: no deformity. Extremities: No clubbing or cyanosis. 2+ stiff chronic appearing edema with violaceous appearance indicative of chronic stasis dermatitis. Pt reports this is longstanding. Distal pedal pulses intact but difficult to palpate. Neuro:  AAOx3. Follows commands. Psych:  Responds to questions appropriately with a normal affect.  Labs    Chemistry  Recent Labs Lab 08/20/16 0157 08/20/16 1519 08/21/16 0248  NA 134* 136 135  K 2.8* 3.1* 3.8  CL 91* 90* 94*  CO2 35* 36* 33*  GLUCOSE 91 95 102*  BUN 37* 32* 32*  CREATININE 1.19* 1.23* 1.20*  CALCIUM 9.0 9.6 9.2  GFRNONAA 44* 43* 44*  GFRAA 52* 50* 51*  ANIONGAP 8 10 8      Hematology  Recent Labs Lab 08/19/16 1812 08/20/16 1519 08/21/16 0248  WBC 6.9 4.6 4.0  RBC 3.33* 3.29* 2.99*  HGB 10.4* 10.0* 9.2*  HCT 32.8* 32.9* 30.2*  MCV 98.5 100.0 101.0*  MCH 31.2 30.4 30.8  MCHC 31.7 30.4 30.5  RDW 13.9 14.2 14.4  PLT 199 192 175    Cardiac EnzymesNo results for input(s): TROPONINI in the last 168 hours.   Recent Labs Lab 08/19/16 1821  TROPIPOC 0.01     BNPNo results for input(s): BNP, PROBNP in the last 168 hours.   DDimer No results for input(s): DDIMER in the last 168 hours.   Radiology    Dg Chest 2 View  Result Date: 08/19/2016 CLINICAL DATA:  Increased heart rate. Recent admission for CHF and leg swelling. EXAM: CHEST  2 VIEW COMPARISON:  Chest x-ray dated 07/30/2016. FINDINGS: Mild cardiomegaly is stable or perhaps slightly improved. Atherosclerotic changes again noted at the aortic arch. Probable mild scarring at the left lung base. Lungs otherwise clear. No pleural effusion or pneumothorax seen. Mild degenerative spurring within the thoracic spine. No acute or suspicious osseous finding. IMPRESSION: No active cardiopulmonary disease. No evidence of pneumonia or pulmonary edema. No evidence of active CHF. Aortic atherosclerosis.  Electronically Signed   By: Franki Cabot M.D.   On: 08/19/2016 19:20    Cardiac Studies   2D Echo 07/31/16 Study Conclusions - Left ventricle: The cavity size was mildly dilated. Systolic   function was normal. The estimated ejection fraction was in the   range of 60% to 65%. Wall motion was normal; there were no   regional wall motion abnormalities. Features are consistent with   a pseudonormal left ventricular filling pattern, with concomitant   abnormal relaxation and increased filling pressure (grade 2   diastolic dysfunction). - Aortic valve: Transvalvular velocity was within the normal range.   There was no stenosis. There was no regurgitation. - Mitral valve: Moderately calcified annulus. Transvalvular   velocity was within the normal range. There was no evidence for   stenosis. There was mild regurgitation. - Left atrium: The atrium was severely dilated. - Right ventricle: The cavity size was normal. Wall thickness was   normal. Systolic function was normal. - Right atrium: The atrium was moderately dilated. - Atrial septum: No defect or patent foramen ovale was identified   by color flow Doppler. - Tricuspid valve: There was mild regurgitation. - Pulmonary arteries: Systolic pressure was within the normal   range. PA peak pressure: 36 mm Hg (S).  Patient Profile     71 y.o. female with morbid obesity, chronic diastolic CHF, chronic lower extremity edema/venous stasis, COPD, chronic diarrhea from UC, hypothyroidism who was recently admitted for worsening LEE and acute on chronic respiratory failure, diuresed for suspected CHF (BNP normal at 82). She diuresed from 283lb->274lb (deemed dry weight at discharge), sent home on Lasix 84m BID + metolazone 57mdaily. Seen as new patient in office 08/13/16 and was feeling poorly; Lasix increased to 8074mAM/29m2mM, Coreg increased, metolazone decreased to 3x/week. Monitor placed - reported to show a run of SVT at 183. Labs revealed  severe hypokalemia to 2.4. She was referred to the ER for further management.   Assessment & Plan    1. Severe hypokalemia - Resolved  2. Paroxysmal SVT - resolved over the past 12-24 hours.  3. Chronic diastolic CHF - still volume overloaded but stable compared to previous chronic baseline 7-14 days ago.  4. Morbid obesity - would consider outpatient sleep study given her recent hypoxia and arrhythmias. Ultimately would benefit from overall body weight loss as well.   Plan discharge today with early follow-up with primary cardiologist. Consider further increasing carvedilol dose as an outpatient if tachycardia continues  to be a problem. Resume outpatient ambulatory monitor for 30 days. Discharge Lasix dose 80 mg daily. Potassium 20 mEq twice a day with basic metabolic panel in 3-5 days as an outpatient. Decrease metolazone to 2.5 mg one day per week, Friday. Transition of care follow-up in 3-5 days (on same date of blood work)  Needs to ambulate prior to discharge.  Signed, Sinclair Grooms, MD  08/21/2016, 8:36 AM

## 2016-08-21 NOTE — Telephone Encounter (Signed)
-----   Message from Candis Schatz sent at 08/21/2016  9:08 AM EST ----- Regarding: TOC call needed Hey! Pt is being dc'd today and will need a TOC call on Friday.  Thanks [employer]

## 2016-08-22 ENCOUNTER — Other Ambulatory Visit: Payer: Self-pay

## 2016-08-22 DIAGNOSIS — I5033 Acute on chronic diastolic (congestive) heart failure: Secondary | ICD-10-CM | POA: Diagnosis not present

## 2016-08-22 DIAGNOSIS — J449 Chronic obstructive pulmonary disease, unspecified: Secondary | ICD-10-CM | POA: Diagnosis not present

## 2016-08-22 DIAGNOSIS — I471 Supraventricular tachycardia: Secondary | ICD-10-CM | POA: Diagnosis not present

## 2016-08-22 DIAGNOSIS — Z87891 Personal history of nicotine dependence: Secondary | ICD-10-CM | POA: Diagnosis not present

## 2016-08-22 DIAGNOSIS — Z602 Problems related to living alone: Secondary | ICD-10-CM | POA: Diagnosis not present

## 2016-08-22 DIAGNOSIS — Z9981 Dependence on supplemental oxygen: Secondary | ICD-10-CM | POA: Diagnosis not present

## 2016-08-22 DIAGNOSIS — E876 Hypokalemia: Secondary | ICD-10-CM | POA: Diagnosis not present

## 2016-08-22 DIAGNOSIS — I110 Hypertensive heart disease with heart failure: Secondary | ICD-10-CM | POA: Diagnosis not present

## 2016-08-22 DIAGNOSIS — I87393 Chronic venous hypertension (idiopathic) with other complications of bilateral lower extremity: Secondary | ICD-10-CM | POA: Diagnosis not present

## 2016-08-22 DIAGNOSIS — K5190 Ulcerative colitis, unspecified, without complications: Secondary | ICD-10-CM | POA: Diagnosis not present

## 2016-08-22 DIAGNOSIS — R7303 Prediabetes: Secondary | ICD-10-CM | POA: Diagnosis not present

## 2016-08-22 NOTE — Telephone Encounter (Signed)
Patient contacted regarding discharge from [facility] on 08/21/16.  Patient understands to follow up with provider Melina Copa, PA on 08/26/16 at 11:30 am at [place], [place], [place] [address].  Patient understands discharge instructions? Yes  Patient understands medications and regiment? Yes  Patient understands to bring all medications to this visit? Yes

## 2016-08-22 NOTE — Patient Outreach (Addendum)
[facility]) Care Management  08/22/2016  ORENA CAVAZOS [birth year] [id number]   Emmi Heart Failure:  Red Alert:  Weight THN eligible:  Yes 2018 APL Transition of Care Services:  Discharge date: 08/21/2016 Telephonic Monthly Assessment - Program:  HF 08/08/16 -   [place] [id number] [phone] (H)   Subjective: Outreach call #1 to patient.  Patient completed call.  Patient states she is doing ok since discharging home.  States she has PCP appt on Monday 08/26/16 and Cardiology appt 08/26/16.   C/o she "can't continue to see the doctors this often". RN CM confirmed the following co-pays with patient.  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   Patient states she was in the [facility] last year but was discharged to rehab for a 20 day free stay.  States Hospice started her on Cholestyramine (Questran) to manage her diarrhea and chronic ulcerative colitis.   Patient states she has been taking for 8-10 months and it is costing her $140.00 every 3 weeks.   States it is not covered by insurance and Patient is going to stop taking the medication due to cost and states  "it is not doing any good anyway."  RN CM discussed option of [facility] Pharmacist and again patient repeated she refused this service and had already made up her mind that she was going to stop taking the medication which would solve the money problem.   Providers: Primary MD: Cyndi Bender, PA, [place] [facility]  - last appt: 08/19/16 Cardiologist:  Dr. Ermalinda Barrios, PA-C  Last appt:  2/2/0/18 Podiatrist:  Patient unable to recall name or name of facility.  HH: [facility] RN, [place], [place], CNA resumed at discharge date on 08/05/2016 but  patient states PT assessed and advised services not needed. CNA was ordered but not able to come and no replacement was sent.  [place] RN resumed services on most recent discharge date:  08/21/16.  States checking vitals and wound care - legs. Insurance:  [facility] Medicare   Psycho/Social: Patient is divorced and lives in her home alone. Daughter lives across the street but has poor health and unable to provide patient any assistance.  Mobility: Ambulates with a walker.  Falls: last fall date 07/30/16 due to low oxygen level.  Transportation:  Daughter Forensic scientist: none.  Patient has paperwork but has not completed.  Confirms interest in completing but has not initiated.  Consent:  Patient consents to discuss her PHI with daughter, [facility] as needed.   Resources:  SS:  1200 + / month  DME:  Walker, oxygen ([place]), scales, eyeglasses, dentures.  W/C ramp (provided by church).   Co-morbidities:  COPD, Chronic diastolic CHF, HLD, Hypothyroidism, Anxiety, Obesity, morbid; chronic ulcerative colitis, chronic venous stasis dermatitis bilateral lower extremities  Admissions: (2) -08/19/16 - 08/21/16 Hypokalemia 2.7, tachycardia - had a run of SVT; heart rate went to 180.  -07/30/2016 -  08/05/2016  Acute on chronic respiratory failure with hypoxia.  Fall due to low oxygen level.    08/22/16 morning home scale weight 276.   BP 105/51 08/19/2016 Weight 273 lb (124 kg) 08/19/2016 Height 63 in (160 cm) 08/19/2016 BMI 48.40 (Obese  Class III) 08/19/2016  Lipid Panel completed 12/28/2012 HDL 62.000 mg 03/13/2016 LDL 79.000 mg 03/13/2016 Cholesterol, total 166.000 m 03/13/2016 Triglycerides 125.000 m 03/13/2016 A1C 4.300 % 03/13/2016 Glucose Random 106.000 08/05/2016  Medications:  Patient taking less than 15 medications  Co-pay cost issues: none  Prevnar (PCV13) 04/01/2014 Pneumovax (PPS 03/26/2011 Flu Vaccine 04/26/2016 tDAP Vaccine 06/24/2008   Encounter Medications:  Outpatient Encounter  Prescriptions as of 08/22/2016  Medication Sig  . albuterol (PROVENTIL) (2.5 MG/3ML) 0.083% nebulizer solution Take 3 mLs by nebulization 4 (four) times daily as needed for wheezing or shortness of breath.   . Biotin 5000 MCG TABS Take 1 tablet by mouth at bedtime.  . carvedilol (COREG) 12.5 MG tablet Take 1 tablet (12.5 mg total) by mouth 2 (two) times daily with a meal.  . cholestyramine (QUESTRAN) 4 g packet Take 4 g by mouth 2 (two) times daily.  . diphenhydrAMINE-zinc acetate (BENADRYL) cream Apply topically 3 (three) times daily as needed for itching.  . furosemide (LASIX) 40 MG tablet Take 2 tablets (80 mg total) by mouth daily.  Marland Kitchen levothyroxine (SYNTHROID, LEVOTHROID) 300 MCG tablet Take 300 mcg by mouth daily before breakfast.  . metolazone (ZAROXOLYN) 2.5 MG tablet Take 1 tablet (2.5 mg total) by mouth once a week. On Fridays.  . potassium chloride SA (K-DUR,KLOR-CON) 20 MEQ tablet Take 1 tablet (20 mEq total) by mouth 2 (two) times daily.  . pravastatin (PRAVACHOL) 20 MG tablet Take 20 mg by mouth at bedtime.   . sulfaSALAzine (AZULFIDINE) 500 MG tablet Take 4 tablets (2,000 mg total) by mouth 2 (two) times daily.   No facility-administered encounter medications on file as of 08/22/2016.     Functional Status:  In your present state of health, do you have any difficulty performing the following activities: 08/20/2016 08/08/2016  Hearing? N Y  Vision? N N  Difficulty concentrating or making decisions? N Y  Walking or climbing stairs? N Y  Dressing or bathing? N Y  Doing errands, shopping? N Y  Conservation officer, nature and eating ? - Y  Using the Toilet? - Y  In the past six months, have you accidently leaked urine? - N  Do you have problems with loss of bowel control? - N  Managing your Medications? - N  Managing your Finances? - N  Housekeeping or managing your Housekeeping? - Y  Some recent data might be hidden    Fall/Depression Screening: PHQ 2/9 Scores 08/08/2016  PHQ - 2 Score 0     Fall Risk  08/08/2016  Falls in the past year? Yes  Injury with Fall? Yes  Risk Factor Category  High Fall Risk  Risk for fall due to : History of fall(s);Impaired balance/gait;Impaired mobility    Preventives: Endo - DM Eye Exam 04/2016 Dr. Katy Fitch:  Right cataract removed 04/2016 and Left eye pending.  Endo - DM Foot Exam 05/14/2016 Podiatrist:  Patient unable to provide name of MD.  RN CM unable to locate in Epic.  Flexible Sigmoidoscopy 11/24/2015 Mammogram 03/13/2016 Pulm - Spirometry 08/05/2016 Former smoker.  Assessment / Plan:  Referral:  08/08/16   [facility] HF Program Consult, Telephonic Screening and Initial Assessment:  08/08/2016 [facility] HF Program Consult, Transition of Care:  08/22/2016 [facility] Telephonic RN CM 08/08/16 Program:  HF 08/08/16  Patient would benefit from  [facility] SW referral and [facility] Pharmacist Referral  -Patient refused Patient high risk for appointment non-adherence.  Patient difficult to engage in services and healthcare behavior changes.   Emmi Education Materials:  (  mailed 08/08/16 - reviewed 08/22/16) - continues to need ongoing review.  -What Is Heart Failure? -Heart Failure:  Keeping Track Of Your Weight Each Day -Heart Failure - Home Monitoring  -The Heart and Ejection Fraction (EF) -Heart Failure:  Working With Your Doctor -Heart Failure:  How To Be Salt Smart -Low-Salt Diet -Counting Carbohydrates -Heart Failure:  Understanding Water Pills and Thirst  -Heart Failure:  When To Call Your Doctor or 911 -Heart Zone Action Magnet -Low/high salt food and How to Read a Food Label chart -[facility] Calendar  RN CM advised in next [facility] scheduled contact call within one week for Transition of Care Services and within one month for telephonic monthly assessment.  RN CM advised to please notify MD of any changes in condition prior to scheduled appt's.   RN CM provided contact name and # [phone] or main office # [phone] and 24-hour nurse line # [phone].  RN CM  confirmed patient is aware of 911 services for urgent emergency needs.  Nathaneil Canary, BSN, RN, [place] Care Management Care Management Coordinator [phone] Direct [phone] Cell [phone] Office [phone] Fax [email or web address]

## 2016-08-23 DIAGNOSIS — I5030 Unspecified diastolic (congestive) heart failure: Secondary | ICD-10-CM | POA: Diagnosis not present

## 2016-08-26 ENCOUNTER — Encounter: Payer: Self-pay | Admitting: Physician Assistant

## 2016-08-26 ENCOUNTER — Ambulatory Visit (INDEPENDENT_AMBULATORY_CARE_PROVIDER_SITE_OTHER): Payer: Medicare Other | Admitting: Physician Assistant

## 2016-08-26 ENCOUNTER — Ambulatory Visit: Payer: Self-pay

## 2016-08-26 VITALS — BP 132/64 | HR 56 | Ht 63.0 in | Wt 279.8 lb

## 2016-08-26 DIAGNOSIS — E876 Hypokalemia: Secondary | ICD-10-CM

## 2016-08-26 DIAGNOSIS — N179 Acute kidney failure, unspecified: Secondary | ICD-10-CM | POA: Diagnosis not present

## 2016-08-26 DIAGNOSIS — I471 Supraventricular tachycardia: Secondary | ICD-10-CM

## 2016-08-26 DIAGNOSIS — I5032 Chronic diastolic (congestive) heart failure: Secondary | ICD-10-CM
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
## 2016-08-26 NOTE — Patient Instructions (Addendum)
Medication Instructions:  None  Labwork: BMET today  Testing/Procedures: None  Follow-Up: Your physician recommends that you schedule a follow-up appointment in: 3 months with Dr. Meda Coffee.    Any Other Special Instructions Will Be Listed Below (If Applicable).  Please double check the dose of your Metolazone.  You should be taking 2.79m once a week. If your bottle says 2.561mtablets- that is one whole tablet. If your bottle says 8m61mablets- you would take a half tablet.   Please let me know if you would like to move forward with a sleep study.   For patients with congestive heart failure, we give them these special instructions:  1. Follow a low-salt diet and watch your fluid intake. In general, you should not be taking in more than 2 liters of fluid per day (no more than 8 glasses per day). Some patients are restricted to less than 1.5 liters of fluid per day (no more than 6 glasses per day). This includes sources of water in foods like soup, coffee, tea, milk, etc. 2. Weigh yourself on the same scale at same time of day and keep a log. 3. Call your doctor: (Anytime you feel any of the following symptoms)  - 3-4 pound weight gain in 1-2 days or 2 pounds overnight  - Shortness of breath, with or without a dry hacking cough  - Swelling in the hands, feet or stomach  - If you have to sleep on extra pillows at night in order to breathe   IT IS IMPORTANT TO LET YOUR DOCTOR KNOW EARLY ON IF YOU ARE HAVING SYMPTOMS SO WE CAN HELP YOU!    If you need a refill on your cardiac medications before your next appointment, please call your pharmacy.

## 2016-08-26 NOTE — Progress Notes (Signed)
This encounter was created in error - please disregard.

## 2016-08-27 ENCOUNTER — Other Ambulatory Visit: Payer: Self-pay

## 2016-08-27 LAB — BASIC METABOLIC PANEL
BUN/Creatinine Ratio: 26 (ref 12–28)
BUN: 26 mg/dL (ref 8–27)
CO2: 33 mmol/L — AB (ref 18–29)
CREATININE: 1 mg/dL (ref 0.57–1.00)
Calcium: 9.3 mg/dL (ref 8.7–10.3)
Chloride: 88 mmol/L — ABNORMAL LOW (ref 96–106)
GFR calc Af Amer: 65 mL/min/{1.73_m2} (ref >59)
GFR calc non Af Amer: 56 mL/min/{1.73_m2} — ABNORMAL LOW (ref >59)
GLUCOSE: 93 mg/dL (ref 65–99)
Potassium: 3.5 mmol/L (ref 3.5–5.2)
SODIUM: 139 mmol/L (ref 134–144)

## 2016-08-27 NOTE — Patient Outreach (Addendum)
[facility]) Care Management  08/27/2016  Jamie Montoya 1946-03-28 [id number]   [facility] Heart Failure, Transition of Care, Monthly Assessment  Red Alert Referral 08/27/15:   Issue:  Weight 277 lbs on 08/23/16 [facility] eligible:  Yes 2018 APL Transition of Care Services:  Discharge date: 08/21/2016 Telephonic Monthly Assessment   Program:  HF 08/08/16    [place] [id number] [phone] (H)   Subjective: Outreach call #1 to patient.  Patient completed call.   Weight and HF discussed:  See notes below.   Providers: Primary MD: Cyndi Bender, PA, [facility]  - last appt: 08/19/16 and 08/26/16.  Cardiologist:  Dr. Ermalinda Barrios, PA-C  Last appt:  2/2/0/18 and 08/26/16.  Podiatrist:  Patient unable to recall name or name of facility.  HH: [facility]   H/o RN, PT, OT, CNA resumed at discharge date on 08/05/2016 but patient states PT assessed and advised services not needed. CNA was ordered but not able to come and no replacement was sent.  [place] RN resumed services on most recent discharge date:  08/21/16.  States checking vitals and wound care - legs.  Patient states she followed up with PCP on 08/26/16 who advised that [facility] PT would be helpful to teach exercises from a sitting position or bed.  (Patient states she does not wish to have this service at this time.  RN CM advised patient to contact RN CM should her self management interventions appear to not be working and needs additional [facility] PT intervention). Insurance:  [facility] Medicare   Psycho/Social: Patient is divorced and lives in her home alone. Daughter lives across the street but has poor health and unable to provide patient any assistance.  Mobility: Ambulates with a walker but difficulty due to walking increasing swelling in legs; patient has notified MD of this finding.   Falls: last fall date 07/30/16 due to low oxygen level.  Transportation:  Daughter Forensic scientist: none.   Patient has paperwork but has not completed.  Confirms interest in completing but has not initiated.  Consent:  Patient consents to discuss her PHI with daughter, [facility] as needed.    Financial:  Healthcare cost issues:  C/o she "can't continue to see the doctors this often". RN CM confirmed the following co-pays with patient.   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  Resources:  SS:  1200 + / month  DME:  Walker, oxygen ([place]), light weight oxygen tank,  scales, Ulna Boots, Support leg stocking Left leg;  eyeglasses, dentures.  W/C ramp (provided by [employer] for $165.00).   Co-morbidities:  COPD, Chronic diastolic CHF, HLD, Hypothyroidism, Anxiety, Obesity, morbid; chronic ulcerative colitis, chronic venous stasis dermatitis bilateral lower extremities  Admissions: (2) -08/19/16 - 08/21/16 Hypokalemia 2.7, tachycardia - had a run of SVT; heart rate went to 180.  -07/30/2016 -  08/05/2016  Acute on chronic respiratory failure with hypoxia.  Fall due to low oxygen level.    Morning home scale weight  08/27/16  277 lb  08/22/16  276 lb  08/21/16 275 lb (discharge weight)  BP 105/51 08/22/2016 Weight 276 lb (125 kg) 08/22/2016 Height 63 in (  160 cm) 08/22/2016 BMI 49.00 (Obese Class III) 08/22/2016  Lipid Panel completed 12/28/2012 HDL 62.000 mg 03/13/2016 LDL 79.000 mg 03/13/2016 Cholesterol, total 166.000 m 03/13/2016 Triglycerides 125.000 m 03/13/2016 A1C 4.300 % 03/13/2016 Glucose Random 102.000 08/21/2016 MicroAlbumin Urine N/D MicroAlbumin/Creat N/D BUN 32.000 08/21/2016 Creatinine, Serum 1.200 08/21/2016 TSH 0.861 08/20/2016  Chronic Ulcerative colitis and Diarrhea Patient states she was in the [facility] last year (2017)  but was discharged to rehab for a 20 day free stay.  States Hospice started her on Cholestyramine (Questran) to manage her diarrhea and chronic ulcerative colitis.   Patient states she has been taking for 8-10 months and it is costing her $140.00 every 3 weeks.   States it is not covered by insurance and patient is going to stop taking the medication due to cost and states  "it is not doing any good anyway."  RN CM discussed option of [facility] Pharmacist and again patient repeated she refused this service and had already made up her mind that she was going to stop taking the medication which would solve her money problem.  Patient had to end call abruptly today due to diarrhea management.   Medications:  Patient taking less than 15 medications  Co-pay cost issues: none  Prevnar (PCV13) 04/01/2014 Pneumovax (PPS 03/26/2011 Flu Vaccine 04/26/2016 tDAP Vaccine 06/24/2008   Encounter Medications:  Outpatient Encounter Prescriptions as of 08/27/2016  Medication Sig  . albuterol (PROVENTIL) (2.5 MG/3ML) 0.083% nebulizer solution Take 3 mLs by nebulization 4 (four) times daily as needed for wheezing or shortness of breath.   . Biotin 5000 MCG TABS Take 1 tablet by mouth at bedtime.  . carvedilol (COREG) 12.5 MG tablet Take 1 tablet (12.5 mg total) by mouth 2 (two) times daily with a meal.  . cholestyramine (QUESTRAN) 4 g packet Take 4 g by mouth 2 (two) times daily.  . diphenhydrAMINE-zinc acetate (BENADRYL) cream Apply topically 3 (three) times daily as needed for itching.  . furosemide (LASIX) 40 MG tablet Take 2 tablets (80 mg total) by mouth daily.  [place] levothyroxine (SYNTHROID, LEVOTHROID) 300 MCG tablet Take 300 mcg by mouth daily before breakfast.  . metolazone (ZAROXOLYN) 2.5 MG tablet Take 1 tablet (2.5 mg total) by mouth once a week. On Fridays.  . potassium chloride SA (K-DUR,KLOR-CON) 20 MEQ tablet Take 1 tablet (20 mEq total) by mouth 2 (two) times daily.  . pravastatin (PRAVACHOL) 20 MG tablet  Take 20 mg by mouth at bedtime.   . sulfaSALAzine (AZULFIDINE) 500 MG tablet Take 4 tablets (2,000 mg total) by mouth 2 (two) times daily.   No facility-administered encounter medications on file as of 08/27/2016.     Functional Status:  In your present state of health, do you have any difficulty performing the following activities: 08/20/2016 08/08/2016  Hearing? N Y  Vision? N N  Difficulty concentrating or making decisions? N Y  Walking or climbing stairs? N Y  Dressing or bathing? N Y  Doing errands, shopping? N Y  Conservation officer, nature and eating ? - Y  Using the Toilet? - Y  In the past six months, have you accidently leaked urine? - N  Do you have problems with loss of bowel control? - N  Managing your Medications? - N  Managing your Finances? - N  Housekeeping or managing your Housekeeping? - Y  Some recent data might be hidden    Fall/Depression Screening: PHQ 2/9 Scores 08/08/2016  PHQ - 2 Score 0    Fall Risk  08/08/2016  Falls in the past year? Yes  Injury with Fall? Yes  Risk Factor Category  High Fall Risk  Risk for fall due to : History of fall(s);Impaired balance/gait;Impaired mobility     Preventives: Endo - DM Eye Exam 04/2016 Dr. Katy Fitch:  Right cataract removed 04/2016 and Left eye pending.  Endo - DM Foot Exam 05/14/2016 Podiatrist:  Patient unable to provide name of MD.  RN CM unable to locate in Epic.  Flexible Sigmoidoscopy 11/24/2015 Mammogram 03/13/2016 Pulm - Spirometry 08/05/2016 Former smoker.  Assessment / Plan:  Referral:  08/08/16   [facility] HF Program Consult, Telephonic Screening and Initial Assessment:  08/08/2016 [facility] HF Program Consult, Transition of Care:  08/22/2016 [facility] Telephonic RN CM 08/08/16 Transition of Care:  08/22/16 (+14) - 09/05/16 Program:  HF 08/08/16  Heart Failure:  Diet Education:  Low Salt, Heart healthy, potassium rich foods.  -RN CM discussed and provided tips and self management interventions to manage the above diet needs.  -RN CM  encouraged to continue daily weights and logging.  -RN CM encouraged medication compliance with ulcerative colitis diarrhea medication to help manage potassium levels that can be related to dehydration from diarrhea.  -RN CM encouraged mobility and walking but in moderation to help manage issues with swelling.   Patient would benefit from [facility] SW referral and [facility] Pharmacist Referral  -Patient refused Patient high risk for appointment non-adherence.  Patient difficult to engage in services and healthcare behavior changes.   [facility] Education Materials:  (mailed 08/08/16 - reviewed 08/22/16) - continues to need ongoing review.  -What Is Heart Failure? -Heart Failure:  Keeping Track Of Your Weight Each Day -Heart Failure - Home Monitoring  -The Heart and Ejection Fraction (EF) -Heart Failure:  Working With Your Doctor -Heart Failure:  How To Be Salt Smart -Low-Salt Diet -Counting Carbohydrates -Heart Failure:  Understanding Water Pills and Thirst  -Heart Failure:  When To Call Your Doctor or 911 -Heart Zone Action Magnet -Low/high salt food and How to Read a Food Label chart -[facility] Calendar  RN CM advised in next [facility] scheduled contact call within one week for Transition of Care Services and within one month for telephonic monthly assessment.  RN CM advised to please notify MD of any changes in condition prior to scheduled appt's.   RN CM provided contact name and # [phone] or main office # [phone] and 24-hour nurse line # [phone].  RN CM confirmed patient is aware of 911 services for urgent emergency needs.  Nathaneil Canary, BSN, RN, [place] Management Care Management Coordinator [phone] Direct [phone] Cell [phone] Office [phone] Fax [email or web address]

## 2016-08-28 DIAGNOSIS — Z87891 Personal history of nicotine dependence: Secondary | ICD-10-CM | POA: Diagnosis not present

## 2016-08-28 DIAGNOSIS — K5190 Ulcerative colitis, unspecified, without complications: Secondary | ICD-10-CM | POA: Diagnosis not present

## 2016-08-28 DIAGNOSIS — I87393 Chronic venous hypertension (idiopathic) with other complications of bilateral lower extremity: Secondary | ICD-10-CM | POA: Diagnosis not present

## 2016-08-28 DIAGNOSIS — I471 Supraventricular tachycardia: Secondary | ICD-10-CM | POA: Diagnosis not present

## 2016-08-28 DIAGNOSIS — I110 Hypertensive heart disease with heart failure: Secondary | ICD-10-CM | POA: Diagnosis not present

## 2016-08-28 DIAGNOSIS — Z602 Problems related to living alone: Secondary | ICD-10-CM | POA: Diagnosis not present

## 2016-08-28 DIAGNOSIS — J449 Chronic obstructive pulmonary disease, unspecified: Secondary | ICD-10-CM | POA: Diagnosis not present

## 2016-08-28 DIAGNOSIS — E876 Hypokalemia: Secondary | ICD-10-CM | POA: Diagnosis not present

## 2016-08-28 DIAGNOSIS — R7303 Prediabetes: Secondary | ICD-10-CM | POA: Diagnosis not present

## 2016-08-28 DIAGNOSIS — Z9981 Dependence on supplemental oxygen: Secondary | ICD-10-CM | POA: Diagnosis not present

## 2016-08-28 DIAGNOSIS — I5033 Acute on chronic diastolic (congestive) heart failure: Secondary | ICD-10-CM | POA: Diagnosis not present

## 2016-08-29 ENCOUNTER — Telehealth: Payer: Self-pay | Admitting: *Deleted

## 2016-08-29 ENCOUNTER — Ambulatory Visit: Payer: Self-pay

## 2016-08-29 DIAGNOSIS — E876 Hypokalemia: Secondary | ICD-10-CM

## 2016-08-29 MED ORDER — FUROSEMIDE 40 MG PO TABS: ORAL_TABLET | ORAL | 1 refills | Status: DC

## 2016-08-29 MED ORDER — POTASSIUM CHLORIDE CRYS ER 20 MEQ PO TBCR: EXTENDED_RELEASE_TABLET | ORAL | 1 refills | Status: DC

## 2016-08-29 NOTE — Telephone Encounter (Signed)
-----   Message from Charlie Pitter, [place] sent at 08/28/2016  4:47 PM EST ----- Please call patient. Labs are overall stable and improved from before. Her potassium remains borderline low. Since her weight was up somewhat in clinic, I think it is safe to start transitioning her back closer to her original diuretic dose. I would recommend increasing Lasix back to 37m QAM and 472mevery afternoon. Do not take afternoon dose if weight is below 274lbs. Increase potassium to 4047mBID. Recheck BMET 2 weeks - she said she has a home health lady coming out who can draw so that she does not have to physically come in the office. Dayna Dunn PA-C

## 2016-08-30 ENCOUNTER — Other Ambulatory Visit: Payer: Self-pay

## 2016-08-30 DIAGNOSIS — Z87891 Personal history of nicotine dependence: Secondary | ICD-10-CM | POA: Diagnosis not present

## 2016-08-30 DIAGNOSIS — K5190 Ulcerative colitis, unspecified, without complications: Secondary | ICD-10-CM | POA: Diagnosis not present

## 2016-08-30 DIAGNOSIS — I110 Hypertensive heart disease with heart failure: Secondary | ICD-10-CM | POA: Diagnosis not present

## 2016-08-30 DIAGNOSIS — I471 Supraventricular tachycardia: Secondary | ICD-10-CM | POA: Diagnosis not present

## 2016-08-30 DIAGNOSIS — Z602 Problems related to living alone: Secondary | ICD-10-CM | POA: Diagnosis not present

## 2016-08-30 DIAGNOSIS — J449 Chronic obstructive pulmonary disease, unspecified: Secondary | ICD-10-CM | POA: Diagnosis not present

## 2016-08-30 DIAGNOSIS — I5033 Acute on chronic diastolic (congestive) heart failure: Secondary | ICD-10-CM | POA: Diagnosis not present

## 2016-08-30 DIAGNOSIS — E876 Hypokalemia: Secondary | ICD-10-CM | POA: Diagnosis not present

## 2016-08-30 DIAGNOSIS — Z9981 Dependence on supplemental oxygen: Secondary | ICD-10-CM | POA: Diagnosis not present

## 2016-08-30 DIAGNOSIS — I87393 Chronic venous hypertension (idiopathic) with other complications of bilateral lower extremity: Secondary | ICD-10-CM | POA: Diagnosis not present

## 2016-08-30 DIAGNOSIS — R7303 Prediabetes: Secondary | ICD-10-CM | POA: Diagnosis not present

## 2016-08-30 NOTE — Patient Outreach (Addendum)
[facility]) Care Management  08/30/2016  Jamie Montoya 07-01-1945 [id number]   Emmi Heart Failure Consult   Referral 08/30/16 Emmi Red Alert  Weight 282  New / worsening problems? yes  New swelling? yes    Subjective: Jamie  [place] [address] [phone] (H)  Outreach call #1 to patient.  Patient completed call.   Patient states she has noted more swelling and weight increasing.  States MD aware and last office appt was 08/26/2016.  Stats office called her yesterday to advise labs (Potassium level) was low again and provided the following order on medication changes.  Lasix 80 mg twice a day.  Potassium twice a day  Recheck lab in a couple of weeks.   Providers: Primary MD: Cyndi Bender, PA, [facility] at [facility]  - last appt: 08/26/16.  Cardiologist:  Dr. Ermalinda Barrios, PA-C  Last appt: 08/26/16.  Podiatrist:  Patient unable to recall name or name of facility.  HH: [facility]   H/o RN, PT, OT, CNA resumed at discharge date on 08/05/2016 but patient states PT assessed and advised services not needed. CNA was ordered but not able to come and no replacement was sent.  [place] RN resumed services on most recent discharge date:  08/21/16.  States checking vitals and wound care - legs.  Patient states she followed up with PCP on 08/26/16 who advised that [facility] PT would be helpful to teach exercises from a sitting position or bed.  (Patient states she does not wish to have this service at this time.  RN CM advised patient to contact RN CM should her self management interventions appear to not be working and needs additional [facility] PT intervention). Insurance:  [facility] Medicare   Psycho/Social: Patient is divorced and lives in her home alone. Daughter lives across the street but has poor health and unable to provide patient any assistance.  Mobility: Ambulates with a walker but difficulty due to walking increasing swelling in legs; patient has  notified MD of this finding.   Falls: last fall date 07/30/16 due to low oxygen level.  Transportation:  Daughter Forensic scientist: none.  Patient has paperwork but has not completed.  Confirms interest in completing but has not initiated.  Consent:  Patient consents to discuss her PHI with daughter, Jamie [place] as needed.    Financial:  Healthcare cost issues:  C/o she "can't continue to see the doctors this often". RN CM confirmed the following co-pays with patient.   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  Resources:  SS:  1200 + / month  DME:  Walker, oxygen ([place]), light weight oxygen tank,  scales, Ulna Boots, Support leg stocking Left leg;  eyeglasses, dentures.  W/C ramp (provided by [employer] for $165.00).   Co-morbidities:  COPD, Chronic diastolic CHF, HLD, Hypothyroidism, Anxiety, Obesity, morbid; chronic ulcerative colitis, chronic venous stasis dermatitis bilateral lower extremities  Admissions: (2) -08/19/16 - 08/21/16 Hypokalemia 2.7, tachycardia - had a run of SVT; heart rate went to 180.  -07/30/2016 -  08/05/2016  Acute on chronic respiratory failure with hypoxia.  Fall due to low oxygen level.    Morning home scale weight  08/30/16  282 lb 08/27/16  277 lb  08/22/16  276 lb  08/21/16 275 lb (discharge weight)  BP 105/51 08/22/2016 Weight 276 lb (125 kg) 08/22/2016 Height 63 in (160 cm) 08/22/2016 BMI 49.00 (Obese Class III) 08/22/2016  Lipid Panel completed 12/28/2012 HDL 62.000 mg 03/13/2016 LDL 79.000 mg 03/13/2016 Cholesterol, total 166.000 m 03/13/2016 Triglycerides 125.000 m 03/13/2016 A1C 4.300 % 03/13/2016 Glucose Random 102.000 08/21/2016 MicroAlbumin Urine N/D MicroAlbumin/Creat N/D BUN 32.000  08/21/2016 Creatinine, Serum 1.200 08/21/2016 TSH 0.861 08/20/2016  Chronic Ulcerative colitis and Diarrhea Patient states she was in the [facility] last year (2017) but was discharged to rehab for a 20 day free stay.  Hospice started her on Cholestyramine (Questran) to manage her diarrhea and chronic ulcerative colitis.   Patient has been taking for 8-10 months and it is costing her $140.00 every 3 weeks.  States it is not covered by insurance and patient is going to stop taking the medication due to cost and states  "it is not doing any good anyway."  RN CM discussed option of [facility] Pharmacist and again patient repeated she refused this service and had already made up her mind that she was going to stop taking the medication which would solve her money problem.    Medications:  Patient taking less than 15 medications  Co-pay cost issues: none  Prevnar (PCV13) 04/01/2014 Pneumovax (PPS 03/26/2011 Flu Vaccine 04/26/2016 tDAP Vaccine 06/24/2008   Encounter Medications:  Outpatient Encounter Prescriptions as of 08/30/2016  Medication Sig  . albuterol (PROVENTIL) (2.5 MG/3ML) 0.083% nebulizer solution Take 3 mLs by nebulization 4 (four) times daily as needed for wheezing or shortness of breath.   . Biotin 5000 MCG TABS Take 1 tablet by mouth at bedtime.  . carvedilol (COREG) 12.5 MG tablet Take 1 tablet (12.5 mg total) by mouth 2 (two) times daily with a meal.  . cholestyramine (QUESTRAN) 4 g packet Take 4 g by mouth 2 (two) times daily.  . diphenhydrAMINE-zinc acetate (BENADRYL) cream Apply topically 3 (three) times daily as needed for itching.  . furosemide (LASIX) 40 MG tablet TAKE 2 TABLETS BY MOUTH IN THE A.M. AND TAKE 1 TABLETS BY MOUTH IN THE P.M.  . levothyroxine (SYNTHROID, LEVOTHROID) 300 MCG tablet Take 300 mcg by mouth daily before breakfast.  . metolazone (ZAROXOLYN) 2.5 MG tablet Take 1 tablet (2.5 mg total) by mouth once a week. On Fridays.  . potassium chloride SA (K-DUR,KLOR-CON) 20  MEQ tablet TAKE 2 TABELTS BY MOUTH TWICE DAILY  . pravastatin (PRAVACHOL) 20 MG tablet Take 20 mg by mouth at bedtime.   . sulfaSALAzine (AZULFIDINE) 500 MG tablet Take 4 tablets (2,000 mg total) by mouth 2 (two) times daily.   No facility-administered encounter medications on file as of 08/30/2016.     Functional Status:  In your present state of health, do you have any difficulty performing the following activities: 08/27/2016 08/20/2016  Hearing? N N  Vision? N N  Difficulty concentrating or making decisions? N N  Walking or climbing stairs? Y N  Dressing or bathing? N N  Doing errands, shopping? Y N  Preparing Food and eating ? - -  Using the Toilet? - -  In the past six months, have you accidently leaked urine? - -  Do you have problems with loss of bowel control? - -  Managing your Medications? - -  Managing your Finances? - -  Housekeeping or managing your Housekeeping? - -  Some recent data might be hidden  Fall/Depression Screening: PHQ 2/9 Scores 08/27/2016 08/08/2016  PHQ - 2 Score 0 0    Fall Risk  08/27/2016 08/08/2016  Falls in the past year? (No Data) Yes  Injury with Fall? Yes Yes  Risk Factor Category  High Fall Risk High Fall Risk  Risk for fall due to : History of fall(s);Impaired balance/gait;Impaired mobility;Medication side effect History of fall(s);Impaired balance/gait;Impaired mobility     Preventives: Endo - DM Eye Exam 04/2016 Dr. Katy Fitch:  Right cataract removed 04/2016 and Left eye pending.  Endo - DM Foot Exam 05/14/2016 Podiatrist:  Patient unable to provide name of MD.  RN CM unable to locate in Epic.  Flexible Sigmoidoscopy 11/24/2015 Mammogram 03/13/2016 Pulm - Spirometry 08/05/2016 Former smoker.  Assessment / Plan:  Referral:  08/08/16   [facility] HF Program Consult, Telephonic Screening and Initial Assessment:  08/08/2016 [facility] HF Program Consult, Transition of Care:  08/22/2016 [facility] Telephonic RN CM 08/08/16 Transition of Care:  08/22/16 (+14) -  09/05/16 Program:  HF 08/08/16  Heart Failure:  Diet Education:  Low Salt, Heart healthy, potassium rich foods.  -RN CM discussed and provided tips and self management interventions to manage the above diet needs.  -RN CM encouraged to continue daily weights and logging.  -RN CM encouraged medication compliance with ulcerative colitis diarrhea medication to help manage potassium levels that can be related to dehydration from diarrhea.  -RN CM encouraged mobility and walking but in moderation to help manage issues with swelling.  -RN CM advised to notify MD should new medication dosages not improve weight and swelling.   Patient would benefit from [facility] SW referral and [facility] Pharmacist Referral  -Patient refused Patient high risk for appointment non-adherence.  Patient difficult to engage in services and healthcare behavior changes.   Emmi Education Materials:  (mailed 08/08/16 - reviewed 08/22/16) - continues to need ongoing review.  -What Is Heart Failure? -Heart Failure:  Keeping Track Of Your Weight Each Day -Heart Failure - Home Monitoring  -The Heart and Ejection Fraction (EF) -Heart Failure:  Working With Your Doctor -Heart Failure:  How To Be Salt Smart -Low-Salt Diet -Counting Carbohydrates -Heart Failure:  Understanding Water Pills and Thirst  -Heart Failure:  When To Call Your Doctor or 911 -Heart Zone Action Magnet -Low/high salt food and How to Read a Food Label chart -[facility] Calendar  RN CM advised in next [facility] scheduled contact call within one week for Transition of Care Services and within one month for telephonic monthly assessment.  RN CM advised to please notify MD of any changes in condition prior to scheduled appt's.   RN CM provided contact name and # [phone] or main office # [phone] and 24-hour nurse line # [phone].  RN CM confirmed patient is aware of 911 services for urgent emergency needs.  Nathaneil Canary, BSN, RN, [place] Care Management Care Management Coordinator [phone] Direct [phone] Cell [phone] Office [phone] Fax [email or web address]

## 2016-09-02 NOTE — Patient Outreach (Signed)
[facility]) Care Management  09/02/2016  Jamie Montoya 04-Apr-1946 [id number]   Patient triggered Red on EMMI heart failure dashboard, notification sent to [name], RN

## 2016-09-03 ENCOUNTER — Ambulatory Visit: Payer: Self-pay

## 2016-09-03 ENCOUNTER — Other Ambulatory Visit: Payer: Self-pay

## 2016-09-03 DIAGNOSIS — J449 Chronic obstructive pulmonary disease, unspecified: Secondary | ICD-10-CM | POA: Diagnosis not present

## 2016-09-03 DIAGNOSIS — Z87891 Personal history of nicotine dependence: Secondary | ICD-10-CM | POA: Diagnosis not present

## 2016-09-03 DIAGNOSIS — R7303 Prediabetes: Secondary | ICD-10-CM | POA: Diagnosis not present

## 2016-09-03 DIAGNOSIS — E876 Hypokalemia: Secondary | ICD-10-CM | POA: Diagnosis not present

## 2016-09-03 DIAGNOSIS — Z602 Problems related to living alone: Secondary | ICD-10-CM | POA: Diagnosis not present

## 2016-09-03 DIAGNOSIS — I471 Supraventricular tachycardia: Secondary | ICD-10-CM | POA: Diagnosis not present

## 2016-09-03 DIAGNOSIS — K5190 Ulcerative colitis, unspecified, without complications: Secondary | ICD-10-CM | POA: Diagnosis not present

## 2016-09-03 DIAGNOSIS — I87393 Chronic venous hypertension (idiopathic) with other complications of bilateral lower extremity: Secondary | ICD-10-CM | POA: Diagnosis not present

## 2016-09-03 DIAGNOSIS — I110 Hypertensive heart disease with heart failure: Secondary | ICD-10-CM | POA: Diagnosis not present

## 2016-09-03 DIAGNOSIS — I5033 Acute on chronic diastolic (congestive) heart failure: Secondary | ICD-10-CM | POA: Diagnosis not present

## 2016-09-03 DIAGNOSIS — Z9981 Dependence on supplemental oxygen: Secondary | ICD-10-CM | POA: Diagnosis not present

## 2016-09-03 NOTE — Patient Outreach (Signed)
[facility]) Care Management  09/03/2016  ANNASTACIA DUBA [birth year] [id number]   [facility] Heart Failure Consult   Referral 09/03/16 [facility] Red Alert  Weight 284   Subjective: [address] [place] [address] [phone] (H)  Outreach call #1 to patient.  Patient completed call.   Patient states she has noted more swelling and weight increasing.  States MD aware and last office appt was 08/26/2016.  Stats office called her yesterday to advise labs (Potassium level) was low again and provided the following order on medication changes.  Lasix 80 mg twice a day.  Potassium twice a day  Recheck lab in a couple of weeks.   Providers: Primary MD: Cyndi Bender, PA, [facility] at [facility]  - last appt: 08/26/16.  Cardiologist:  Dr. Ermalinda Barrios, PA-C  Last appt: 08/26/16.  Podiatrist:  Patient unable to recall name or name of facility.  HH: [facility]   H/o RN, PT, OT, CNA resumed at discharge date on 08/05/2016 but patient states PT assessed and advised services not needed. CNA was ordered but not able to come and no replacement was sent.  [place] RN resumed services on most recent discharge date:  08/21/16.  States checking vitals and wound care - legs.  Patient states she followed up with PCP on 08/26/16 who advised that [facility] PT would be helpful to teach exercises from a sitting position or bed.  (Patient states she does not wish to have this service at this time.  RN CM advised patient to contact RN CM should her self management interventions appear to not be working and needs additional [facility] PT intervention). Insurance:  [facility] Medicare   Psycho/Social: Patient is divorced and lives in her home alone. Daughter lives across the street but has poor health and unable to provide patient any assistance.  Mobility: Ambulates with a walker but difficulty due to walking increasing swelling in legs; patient has notified MD of this finding.   Falls: last fall date  07/30/16 due to low oxygen level.  Transportation:  Daughter Forensic scientist: none.  Patient has paperwork but has not completed.  Confirms interest in completing but has not initiated.  Consent:  Patient consents to discuss her PHI with daughter, [employer] [employer] as needed.    Financial:  Healthcare cost issues:  C/o she "can't continue to see the doctors this often". RN CM confirmed the following co-pays with patient.   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  Resources:  SS:  1200 + / month  DME:  Walker, oxygen ([place]), light weight oxygen tank,  scales, Ulna Boots, Support leg stocking Left leg;  eyeglasses, dentures.  W/C ramp (provided by [employer] for $165.00).   Co-morbidities:  COPD, Chronic diastolic CHF, HLD, Hypothyroidism, Anxiety, Obesity, morbid; chronic ulcerative colitis, chronic venous stasis dermatitis bilateral lower extremities  Admissions: (2) -08/19/16 - 08/21/16 Hypokalemia 2.7, tachycardia - had a run of SVT; heart rate went to 180.  -07/30/2016 -  08/05/2016  Acute on chronic respiratory failure with hypoxia.  Fall due to low oxygen level.    Morning home scale weight  09/02/16 284 lb 09/01/16 287 lb 08/30/16 282 lb 08/27/16  277  lb  08/22/16  276 lb  08/21/16 275 lb (discharge weight)  BP 105/51 08/22/2016 Weight 276 lb (125 kg) 08/22/2016 Height 63 in (160 cm) 08/22/2016 BMI 49.00 (Obese Class III) 08/22/2016  Lipid Panel completed 12/28/2012 HDL 62.000 mg 03/13/2016 LDL 79.000 mg 03/13/2016 Cholesterol, total 166.000 m 03/13/2016 Triglycerides 125.000 m 03/13/2016 A1C 4.300 % 03/13/2016 Glucose Random 102.000 08/21/2016 MicroAlbumin Urine N/D MicroAlbumin/Creat N/D BUN 32.000 08/21/2016 Creatinine, Serum 1.200  08/21/2016 TSH 0.861 08/20/2016  Chronic Ulcerative colitis and Diarrhea Patient states she was in the [facility] last year (2017) but was discharged to rehab for a 20 day free stay.  Hospice started her on Cholestyramine (Questran) to manage her diarrhea and chronic ulcerative colitis.   Patient has been taking for 8-10 months and it is costing her $140.00 every 3 weeks.  States it is not covered by insurance and patient is going to stop taking the medication due to cost and states  "it is not doing any good anyway."  RN CM discussed option of [facility] Pharmacist and again patient repeated she refused this service and had already made up her mind that she was going to stop taking the medication which would solve her money problem.    Medications:  Patient taking less than 15 medications  Co-pay cost issues: none  Prevnar (PCV13) 04/01/2014 Pneumovax (PPS 03/26/2011 Flu Vaccine 04/26/2016 tDAP Vaccine 06/24/2008   Encounter Medications:  Outpatient Encounter Prescriptions as of 09/03/2016  Medication Sig  . albuterol (PROVENTIL) (2.5 MG/3ML) 0.083% nebulizer solution Take 3 mLs by nebulization 4 (four) times daily as needed for wheezing or shortness of breath.   . Biotin 5000 MCG TABS Take 1 tablet by mouth at bedtime.  . carvedilol (COREG) 12.5 MG tablet Take 1 tablet (12.5 mg total) by mouth 2 (two) times daily with a meal.  . cholestyramine (QUESTRAN) 4 g packet Take 4 g by mouth 2 (two) times daily.  . diphenhydrAMINE-zinc acetate (BENADRYL) cream Apply topically 3 (three) times daily as needed for itching.  . furosemide (LASIX) 40 MG tablet TAKE 2 TABLETS BY MOUTH IN THE A.M. AND TAKE 1 TABLETS BY MOUTH IN THE P.M.  . levothyroxine (SYNTHROID, LEVOTHROID) 300 MCG tablet Take 300 mcg by mouth daily before breakfast.  . metolazone (ZAROXOLYN) 2.5 MG tablet Take 1 tablet (2.5 mg total) by mouth once a week. On Fridays.  . potassium chloride SA (K-DUR,KLOR-CON) 20 MEQ tablet TAKE 2 TABELTS BY  MOUTH TWICE DAILY  . pravastatin (PRAVACHOL) 20 MG tablet Take 20 mg by mouth at bedtime.   . sulfaSALAzine (AZULFIDINE) 500 MG tablet Take 4 tablets (2,000 mg total) by mouth 2 (two) times daily.   No facility-administered encounter medications on file as of 09/03/2016.     Functional Status:  In your present state of health, do you have any difficulty performing the following activities: 08/27/2016 08/20/2016  Hearing? N N  Vision? N N  Difficulty concentrating or making decisions? N N  Walking or climbing stairs? Y N  Dressing or bathing? N N  Doing errands, shopping? Y N  Preparing Food and eating ? - -  Using the Toilet? - -  In the past six months, have you accidently leaked urine? - -  Do you have problems with loss of bowel control? - -  Managing your Medications? - -  Managing your Finances? - -  Housekeeping or managing your Housekeeping? - -  Some recent data might be hidden    Fall/Depression Screening: PHQ 2/9  Scores 08/27/2016 08/08/2016  PHQ - 2 Score 0 0    Fall Risk  08/27/2016 08/08/2016  Falls in the past year? (No Data) Yes  Injury with Fall? Yes Yes  Risk Factor Category  High Fall Risk High Fall Risk  Risk for fall due to : History of fall(s);Impaired balance/gait;Impaired mobility;Medication side effect History of fall(s);Impaired balance/gait;Impaired mobility     Preventives: Endo - DM Eye Exam 04/2016 Dr. Katy Fitch:  Right cataract removed 04/2016 and Left eye pending.  Endo - DM Foot Exam 05/14/2016 Podiatrist:  Patient unable to provide name of MD.  RN CM unable to locate in Epic.  Flexible Sigmoidoscopy 11/24/2015 Mammogram 03/13/2016 Pulm - Spirometry 08/05/2016 Former smoker.  Assessment / Plan:  Referral:  08/08/16   [facility] HF Program Consult, Telephonic Screening and Initial Assessment:  08/08/2016 [facility] HF Program Consult, Transition of Care:  08/22/2016 [facility] Telephonic RN CM 08/08/16 Transition of Care:  08/22/16 (+14) - 09/05/16 Program:  HF 08/08/16  Heart  Failure:  -RN CM provided Diet Education:  Low Salt, Heart healthy, potassium rich foods.  -RN CM discussed and provided tips and self management interventions to manage the above diet needs.  -RN CM encouraged to continue daily weights and logging.  -RN CM encouraged medication compliance with ulcerative colitis diarrhea medication to help manage potassium levels that can be related to dehydration from diarrhea.  -RN CM encouraged mobility and walking but in moderation to help manage issues with swelling.  -RN CM advised to notify MD should new medication dosages not improve weight and swelling.   Patient would benefit from [facility] SW referral and [facility] Pharmacist Referral  -Patient refused Patient high risk for appointment non-adherence.  Patient difficult to engage in services and healthcare behavior changes.   Emmi Education Materials:  (mailed 08/08/16 - reviewed 08/22/16, 09/04/15) - continues to need ongoing review.  -What Is Heart Failure? -Heart Failure:  Keeping Track Of Your Weight Each Day -Heart Failure - Home Monitoring  -The Heart and Ejection Fraction (EF) -Heart Failure:  Working With Your Doctor -Heart Failure:  How To Be Salt Smart -Low-Salt Diet -Counting Carbohydrates -Heart Failure:  Understanding Water Pills and Thirst  -Heart Failure:  When To Call Your Doctor or 911 -Heart Zone Action Magnet -Low/high salt food and How to Read a Food Label chart -[facility] Calendar  RN CM advised in next [facility] scheduled contact call within one week for Transition of Care Services and within one month for telephonic monthly assessment.  RN CM advised to please notify MD of any changes in condition prior to scheduled appt's.   RN CM provided contact name and # [phone] or main office # [phone] and 24-hour nurse line # [phone].  RN CM confirmed patient is aware of 911 services for urgent emergency needs.  Nathaneil Canary, BSN, RN, [place]  Management Care Management Coordinator [phone] Direct [phone] Cell [phone] Office [phone] Fax [email or web address]

## 2016-09-05 ENCOUNTER — Ambulatory Visit: Payer: Medicare Other

## 2016-09-05 DIAGNOSIS — I5033 Acute on chronic diastolic (congestive) heart failure: Secondary | ICD-10-CM | POA: Diagnosis not present

## 2016-09-05 DIAGNOSIS — I471 Supraventricular tachycardia: Secondary | ICD-10-CM | POA: Diagnosis not present

## 2016-09-05 DIAGNOSIS — J449 Chronic obstructive pulmonary disease, unspecified: Secondary | ICD-10-CM | POA: Diagnosis not present

## 2016-09-05 DIAGNOSIS — I110 Hypertensive heart disease with heart failure: Secondary | ICD-10-CM | POA: Diagnosis not present

## 2016-09-05 DIAGNOSIS — Z9981 Dependence on supplemental oxygen: Secondary | ICD-10-CM | POA: Diagnosis not present

## 2016-09-05 DIAGNOSIS — K5190 Ulcerative colitis, unspecified, without complications: Secondary | ICD-10-CM | POA: Diagnosis not present

## 2016-09-05 DIAGNOSIS — Z602 Problems related to living alone: Secondary | ICD-10-CM | POA: Diagnosis not present

## 2016-09-05 DIAGNOSIS — R7303 Prediabetes: Secondary | ICD-10-CM | POA: Diagnosis not present

## 2016-09-05 DIAGNOSIS — I87393 Chronic venous hypertension (idiopathic) with other complications of bilateral lower extremity: Secondary | ICD-10-CM | POA: Diagnosis not present

## 2016-09-05 DIAGNOSIS — Z87891 Personal history of nicotine dependence: Secondary | ICD-10-CM | POA: Diagnosis not present

## 2016-09-05 DIAGNOSIS — E876 Hypokalemia: Secondary | ICD-10-CM | POA: Diagnosis not present

## 2016-09-10 ENCOUNTER — Other Ambulatory Visit: Payer: Self-pay

## 2016-09-10 DIAGNOSIS — R7303 Prediabetes: Secondary | ICD-10-CM | POA: Diagnosis not present

## 2016-09-10 DIAGNOSIS — Z602 Problems related to living alone: Secondary | ICD-10-CM | POA: Diagnosis not present

## 2016-09-10 DIAGNOSIS — Z9981 Dependence on supplemental oxygen: Secondary | ICD-10-CM | POA: Diagnosis not present

## 2016-09-10 DIAGNOSIS — I110 Hypertensive heart disease with heart failure: Secondary | ICD-10-CM | POA: Diagnosis not present

## 2016-09-10 DIAGNOSIS — Z87891 Personal history of nicotine dependence: Secondary | ICD-10-CM | POA: Diagnosis not present

## 2016-09-10 DIAGNOSIS — K5190 Ulcerative colitis, unspecified, without complications: Secondary | ICD-10-CM | POA: Diagnosis not present

## 2016-09-10 DIAGNOSIS — E876 Hypokalemia: Secondary | ICD-10-CM | POA: Diagnosis not present

## 2016-09-10 DIAGNOSIS — I471 Supraventricular tachycardia: Secondary | ICD-10-CM | POA: Diagnosis not present

## 2016-09-10 DIAGNOSIS — I87393 Chronic venous hypertension (idiopathic) with other complications of bilateral lower extremity: Secondary | ICD-10-CM | POA: Diagnosis not present

## 2016-09-10 DIAGNOSIS — J449 Chronic obstructive pulmonary disease, unspecified: Secondary | ICD-10-CM | POA: Diagnosis not present

## 2016-09-10 DIAGNOSIS — I5033 Acute on chronic diastolic (congestive) heart failure: Secondary | ICD-10-CM | POA: Diagnosis not present

## 2016-09-10 NOTE — Patient Outreach (Addendum)
[place] [facility]) Care Management  09/10/2016  Jamie Montoya 1945/07/27 [id number]   [facility] Heart Failure Consult   Referral 09/10/16 [facility] Red Alert:  Weight [place] [address] [phone] (H)    Outreach call #1 to patient.  Patient not reached.  RN CM left HIPAA compliant voice message with name and number.  H/o Emmi Weight Report:  09/09/16 293 lb 09/08/16 291 lb 09/07/16 290 lb 09/06/16 288 lb 09/04/16 285 lb 09/02/16 284 lb 09/01/16 287 lb 08/30/16 282 lb 08/27/16  277 lb  08/22/16  276 lb  08/21/16 275 lb (discharge weight)  Plan:  RN CM scheduled for next outreach call within next 1-2 days.     Nathaneil Canary, BSN, RN, [place] Management Care Management Coordinator [phone] Direct [phone] Cell [phone] Office [phone] Fax [email or web address]

## 2016-09-11 ENCOUNTER — Other Ambulatory Visit: Payer: Self-pay

## 2016-09-11 ENCOUNTER — Telehealth: Payer: Self-pay | Admitting: Physician Assistant

## 2016-09-11 NOTE — Patient Outreach (Addendum)
[facility]) Care Management  09/11/2016  Jamie Montoya [birth year] [id number]   Emmi Heart Failure Consult and Transition of care call.   Referral 09/10/16 Emmi Red Alert:  Weight [place] [id number] [phone] (H)   Subjective: Outreach call #2 to patient.  Patient completed call.    Providers: Primary MD: Cyndi Bender, [place], [place] at [place]  - last appt: 08/26/16.  Cardiologist: Ermalinda Barrios, PA-C  Last appt: 08/26/16.  Podiatrist:  Patient unable to recall name or name of facility.  HH: [facility]   H/o RN (Wound Care), PT, OT, CNA resumed at discharge date on 08/05/2016 but patient states PT assessed and advised services not needed. CNA was ordered but not able to come and no replacement was sent.  [place] RN resumed services on most recent discharge date:  08/21/16.  States checking vitals and wound care - legs.  Patient states she followed up with PCP on 08/26/16 who advised that [facility] PT would be helpful to teach exercises from a sitting position or bed.  (Patient states she does not wish to have this service at this time.  RN CM advised patient to contact RN CM should her self management interventions appear to not be working and needs additional [facility] PT intervention). Insurance:  [facility] Medicare   Psycho/Social: Patient is divorced and lives in her home alone. Daughter lives across the street but has poor health and unable to provide patient any assistance.  Mobility: Ambulates with a walker but difficulty due to walking increasing swelling in legs; patient has notified MD of this finding.   Falls: last fall date 07/30/16 due to low oxygen level.  Transportation:  Daughter Forensic scientist: none.  Patient has paperwork but has not completed.  Confirms interest in completing but has not initiated.  Consent:  Patient consents to discuss her PHI with daughter, [facility] as needed.    Financial:   Healthcare cost issues:  C/o she "can't continue to see the doctors this often". RN CM confirmed the following co-pays with patient.   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  Resources:  SS:  1200 + / month  DME:  Walker, oxygen ([place]), light weight oxygen tank,  scales, Ulna Boots, Support leg stocking Left leg;  eyeglasses, dentures.  W/C ramp (provided by [employer] for $165.00).   Co-morbidities:  COPD, Chronic diastolic CHF, HLD, Hypothyroidism, Anxiety, Obesity, morbid; chronic ulcerative colitis, chronic venous stasis dermatitis bilateral lower extremities  Admissions: (2) -08/19/16 - 08/21/16 Hypokalemia 2.7, tachycardia - had a run of SVT; heart rate went to 180.  -07/30/2016 -  08/05/2016  Acute on chronic respiratory failure with hypoxia.  Fall due to low oxygen level.    CHF Leg swelling and weight increasing.   H/o patient reported 3/13 that MD was aware and last office appt was 08/26/2016.  States office called her 09/02/16 to advise labs (Potassium level) was low again and provided the following order on medication changes.  Lasix 80 mg twice a day.  Potassium twice a day  Recheck lab in a couple of weeks.  Patient states her morning weight is 296 and  up 2 lbs since yesterday.    H/o Emmi Weight Report:  09/11/16 296 lb  09/10/16  294 lb  09/09/16 293 lb 09/08/16 291 lb 09/07/16 290 lb 09/06/16 288 lb 09/04/16 285 lb 09/02/16 284 lb   09/01/16 287 lb 08/30/16 282 lb 08/27/16  277 lb  08/22/16  276 lb  08/21/16 275 lb (discharge weight)    RN CM advised patient of weight increase of 21 lbs up since discharge.   RN CM advised p[atient on last contact call 09/03/16 to notify MD should new medication dosages not improve weight  and swelling.  Patient states her MD advised if patient starting to gain weight to take Lasix 2 tabs twice a day but she has not done this.  Patient verified she is taking Lasix 2 every morning and 1 every night.  Patient states her Home Health Nurse visited yesterday to manage her wounds and advised patient to call her MD regarding weight and swelling in legs.  Patient states I think the nurse should call my doctor.  Patient verbalized HF Action Plan but does not act on what she is able to state and knows to do.  Patient stated "I may increase my Lasix to 2 tabs twice a day."  BP 105/51 08/22/2016 Weight 276 lb (125 kg) 08/22/2016 Height 63 in (160 cm) 08/22/2016 BMI 49.00 (Obese Class III) 08/22/2016  Lipid Panel completed 12/28/2012 HDL 62.000 mg 03/13/2016 LDL 79.000 mg 03/13/2016 Cholesterol, total 166.000 m 03/13/2016 Triglycerides 125.000 m 03/13/2016 A1C 4.300 % 03/13/2016 Glucose Random 102.000 08/21/2016 MicroAlbumin Urine N/D MicroAlbumin/Creat N/D BUN 32.000 08/21/2016 Creatinine, Serum 1.200 08/21/2016 TSH 0.861 08/20/2016  Chronic Ulcerative colitis and Diarrhea Patient states she was in the [facility] last year (2017) but was discharged to rehab for a 20 day free stay.  Hospice started her on Cholestyramine (Questran) to manage her diarrhea and chronic ulcerative colitis.   Patient has been taking for 8-10 months and it is costing her $140.00 every 3 weeks.  States it is not covered by insurance and patient is going to stop taking the medication due to cost and states  "it is not doing any good anyway."  RN CM discussed option of [facility] Pharmacist and again patient repeated she refused this service and had already made up her mind that she was going to stop taking the medication which would solve her money problem.   Medications:  Patient taking less than 15 medications  Co-pay cost issues: none  Prevnar (PCV13) 04/01/2014 Pneumovax (PPS 03/26/2011 Flu Vaccine 04/26/2016 tDAP Vaccine  06/24/2008   Encounter Medications:  Outpatient Encounter Prescriptions as of 09/11/2016  Medication Sig  . albuterol (PROVENTIL) (2.5 MG/3ML) 0.083% nebulizer solution Take 3 mLs by nebulization 4 (four) times daily as needed for wheezing or shortness of breath.   . Biotin 5000 MCG TABS Take 1 tablet by mouth at bedtime.  . carvedilol (COREG) 12.5 MG tablet Take 1 tablet (12.5 mg total) by mouth 2 (two) times daily with a meal.  . cholestyramine (QUESTRAN) 4 g packet Take 4 g by mouth 2 (two) times daily.  . diphenhydrAMINE-zinc acetate (BENADRYL) cream Apply topically 3 (three) times daily as needed for itching.  . furosemide (LASIX) 40 MG tablet TAKE 2 TABLETS BY MOUTH IN THE A.M. AND TAKE 1 TABLETS BY MOUTH IN THE P.M.  . levothyroxine (SYNTHROID, LEVOTHROID) 300 MCG tablet Take 300 mcg by mouth daily before breakfast.  . metolazone (ZAROXOLYN) 2.5 MG tablet Take 1 tablet (2.5 mg  total) by mouth once a week. On Fridays.  . potassium chloride SA (K-DUR,KLOR-CON) 20 MEQ tablet TAKE 2 TABELTS BY MOUTH TWICE DAILY  . pravastatin (PRAVACHOL) 20 MG tablet Take 20 mg by mouth at bedtime.   . sulfaSALAzine (AZULFIDINE) 500 MG tablet Take 4 tablets (2,000 mg total) by mouth 2 (two) times daily.   No facility-administered encounter medications on file as of 09/11/2016.     Functional Status:  In your present state of health, do you have any difficulty performing the following activities: 08/27/2016 08/20/2016  Hearing? N N  Vision? N N  Difficulty concentrating or making decisions? N N  Walking or climbing stairs? Y N  Dressing or bathing? N N  Doing errands, shopping? Y N  Preparing Food and eating ? - -  Using the Toilet? - -  In the past six months, have you accidently leaked urine? - -  Do you have problems with loss of bowel control? - -  Managing your Medications? - -  Managing your Finances? - -  Housekeeping or managing your Housekeeping? - -  Some recent data might be hidden     Fall/Depression Screening: PHQ 2/9 Scores 09/05/2016 08/27/2016 08/08/2016  PHQ - 2 Score 0 0 0    Fall Risk  08/27/2016 08/08/2016  Falls in the past year? (No Data) Yes  Injury with Fall? Yes Yes  Risk Factor Category  High Fall Risk High Fall Risk  Risk for fall due to : History of fall(s);Impaired balance/gait;Impaired mobility;Medication side effect History of fall(s);Impaired balance/gait;Impaired mobility     Preventives: Endo - DM Eye Exam 04/2016 Dr. Katy Fitch:  Right cataract removed 04/2016 and Left eye pending.  Endo - DM Foot Exam 05/14/2016 Podiatrist:  Patient unable to provide name of MD.  RN CM unable to locate in Epic.  Flexible Sigmoidoscopy 11/24/2015 Mammogram 03/13/2016 Pulm - Spirometry 08/05/2016 Former smoker.  Assessment / Plan:  Referral:  08/08/16   [facility] HF Program Consult, Telephonic Screening and Initial Assessment:  08/08/2016 [facility] HF Program Consult, Transition of Care:  08/22/2016 [facility] Telephonic RN CM 08/08/16 Transition of Care:  08/22/16 (+14) - 09/11/16 Program:  HF 08/08/16  Heart Failure:  -RN CM provided Diet Education:  Low Salt, Heart healthy, potassium rich foods.  -RN CM discussed and provided tips and self management interventions to manage the above diet needs.  -RN CM encouraged to continue daily weights and logging.  -RN CM encouraged medication compliance with ulcerative colitis diarrhea medication to help manage potassium levels that can be related to dehydration from diarrhea.  -RN CM encouraged mobility and walking but in moderation to help manage issues with swelling.  -RN CM advised to notify MD should new medication dosages not improve weight and swelling 09/03/16 - RN CM advised patient to notify MD of weight gain today.  RN CM advised patient that patient is aware of HF guidelines to notify MD of weight gain and patient is able to verbalize action plan.   -RN CM advised patient that patient must be proactive in assessing her symptoms through  weighing and logging daily and reporting findings when patient identifies a change in her condition.  Advised these interventions will help prevent hospital admissions by reporting early rather than not notifying her MD.   -RN CM advised to notify MD of weight today; request appt if needed as soon as possible and seek PCP appt if Cardiologist is unable to accommodate or advise via phone call.    -RN CM  provided phone numbers of both PCP and Cardiology Practice.   -RN CM notified Cardiologist via Epic in-basket.  -RN CM notified PCP via routing 09/11/2016 assessment note.   Patient would benefit from [facility] SW referral and [facility] Pharmacist Referral  -Patient refused Patient high risk for appointment non-adherence.  Patient difficult to engage in services and healthcare behavior changes.   Emmi Education Materials:  (mailed 08/08/16 - reviewed 08/22/16, 09/04/15, 09/11/16) - continues to need ongoing review.  -What Is Heart Failure? -Heart Failure:  Keeping Track Of Your Weight Each Day -Heart Failure - Home Monitoring  -The Heart and Ejection Fraction (EF) -Heart Failure:  Working With Your Doctor -Heart Failure:  How To Be Salt Smart -Low-Salt Diet -Counting Carbohydrates -Heart Failure:  Understanding Water Pills and Thirst  -Heart Failure:  When To Call Your Doctor or 911 -Heart Zone Action Magnet -Low/high salt food and How to Read a Food Label chart -[facility] Calendar  RN CM advised in next [facility] scheduled contact call within one week for Transition of Care Services and within one month for telephonic monthly assessment.  RN CM advised to please notify MD of any changes in condition prior to scheduled appt's.   RN CM provided contact name and # [phone] or main office # [phone] and 24-hour nurse line # [phone].  RN CM confirmed patient is aware of 911 services for urgent emergency needs.  Nathaneil Canary, BSN, RN, [place] Management Care  Management Coordinator [phone] Direct [phone] Cell [phone] Office [phone] Fax [email or web address]

## 2016-09-11 NOTE — Telephone Encounter (Signed)
Pt called today with complained of gaining weight. Pt states she has gained a lot since D/C from the hospital.  Pt was in th ED on 07/30/16 with acute on chronic systolic HF. Pt said that the pt outritch called her every day asking questions about her health and said that she needs to call the doctor. Pt C/O that she does not need for them to call her because they are not doing anything for her. Pt states that when she was D/C from the hospital she wt 277 lbs today she is 296 lbs, and LE edema. Pt states she is taking Lasix 80 mg in the AM and 40 mg in the evening. Pt denies experiencing increased SOB. She states has not eating more than usual. The weight is because she needs maybe  to increased her lasix dose. Pt sounds like having anger of what is going on with her care. When asking what was her BP today she respond" if you want to know you need to call [facility] they keep a record of everyday BP and wt help monitor [phone]"

## 2016-09-11 NOTE — Telephone Encounter (Signed)
New message   Pt c/o medication issue:  1. Name of Medication: furosemide (LASIX) 40 MG tablet  2. How are you currently taking this medication (dosage and times per day)? 40 mg  3. Are you having a reaction (difficulty breathing--STAT)? no  4. What is your medication issue? Pt weight keeps going up and she wants to know if she should increase her medication for it to go down

## 2016-09-11 NOTE — Patient Outreach (Signed)
Jamie Montoya) Care Management  09/11/2016  Jamie Montoya 1945-12-29 [id number]   Update received back from Cardiologist Practice: Elberta Spaniel,  Patient actually saw Melina Copa, PA-C on 08/26/16, not me. If her weight is up this high she needs to increase Lasix to 80 mg BID and take a metolazone 2.5 mg today and tomorrow. Increased K dur 20 meq 2 tablets twice a day. I am copying Dayna Dunn and Jeanann Lewandowsky to schedule a f/u in our office this week. If she doesn't improve she'll have to go to ER.  Jeannie Fend can you get this patient an appt this week with Dr. Meda Coffee or someone on her team? I'm in [place] this week.   Thanks,  Ermalinda Barrios    RN CM contacted patient to provide update.  Patient states when she called to schedule appt that contact instructed in no medication changes and patient prefers to wait until in the morning to see the MD.  Patient states she hopes she can make this appt but if freezing temperature and wet roads she will not go to the appt.  States she will get her daughter to provide transportation.   RN CM encouraged patient to keep appointment if at all possible.   RN CM advised in the importance of medical attention to avoid Montoya admission.  RN CM provided appt details again for clarification:  Dr. Meda Coffee at the Clinic 09/12/16 at 9:15 am.  [place] Team updated:  Melina Copa, PA-C  Nathaneil Canary, BSN, RN, CCM  [facility] Management Coordinator [phone] Direct [phone] Cell [phone] Office [phone] Fax [email or web address]

## 2016-09-11 NOTE — Telephone Encounter (Signed)
Can you add her to my schedule tomorrow?

## 2016-09-11 NOTE — Telephone Encounter (Signed)
Notified the pt that per Dr Meda Coffee, she would like to see her in clinic tomorrow, if she is available. Added the pt into Dr Francesca Oman clinic for tomorrow 09/12/16 at 0915, pt advised to arrive 15 mins prior too her appt. Pt verbalized understanding and agrees with this plan.

## 2016-09-12 ENCOUNTER — Ambulatory Visit: Payer: Medicare Other | Admitting: Cardiology

## 2016-09-12 ENCOUNTER — Telehealth: Payer: Self-pay | Admitting: *Deleted

## 2016-09-12 DIAGNOSIS — I110 Hypertensive heart disease with heart failure: Secondary | ICD-10-CM | POA: Diagnosis not present

## 2016-09-12 DIAGNOSIS — E876 Hypokalemia: Secondary | ICD-10-CM | POA: Diagnosis not present

## 2016-09-12 DIAGNOSIS — J449 Chronic obstructive pulmonary disease, unspecified: Secondary | ICD-10-CM | POA: Diagnosis not present

## 2016-09-12 DIAGNOSIS — I471 Supraventricular tachycardia: Secondary | ICD-10-CM | POA: Diagnosis not present

## 2016-09-12 DIAGNOSIS — I5033 Acute on chronic diastolic (congestive) heart failure: Secondary | ICD-10-CM | POA: Diagnosis not present

## 2016-09-12 DIAGNOSIS — Z602 Problems related to living alone: Secondary | ICD-10-CM | POA: Diagnosis not present

## 2016-09-12 DIAGNOSIS — Z87891 Personal history of nicotine dependence: Secondary | ICD-10-CM | POA: Diagnosis not present

## 2016-09-12 DIAGNOSIS — K5190 Ulcerative colitis, unspecified, without complications: Secondary | ICD-10-CM | POA: Diagnosis not present

## 2016-09-12 DIAGNOSIS — I87393 Chronic venous hypertension (idiopathic) with other complications of bilateral lower extremity: Secondary | ICD-10-CM | POA: Diagnosis not present

## 2016-09-12 DIAGNOSIS — Z9981 Dependence on supplemental oxygen: Secondary | ICD-10-CM | POA: Diagnosis not present

## 2016-09-12 DIAGNOSIS — R7303 Prediabetes: Secondary | ICD-10-CM | POA: Diagnosis not present

## 2016-09-12 MED ORDER — SPIRONOLACTONE 25 MG PO TABS
25.0000 mg | ORAL_TABLET | Freq: Every day | ORAL | 3 refills | Status: DC
Start: 2016-09-12 — End: 2016-12-06

## 2016-09-12 NOTE — Telephone Encounter (Signed)
Called pt, per Melina Copa, PA-C, per Dr. Meda Coffee, have pt increase Lasix to 80 mg bid, Potassium 20 meq 2 bid, and to take a Metolazone 2.5 mg today ONLY and to add Spironolactone 25 md qd and have pt come see her 3/28/158 arriving at 9:15.  Pt verbalized understanding.  Pt advised that she is aware of this med adjustment and she will start this regimen today.  Spoke with Lorre Nick at [facility], and she will have HHN, Helene Kelp, will draw labs (BMET) today and 09/16/16 and it has been requested that we receive the fax copy to [phone].

## 2016-09-16 DIAGNOSIS — I471 Supraventricular tachycardia: Secondary | ICD-10-CM | POA: Diagnosis not present

## 2016-09-16 DIAGNOSIS — R7303 Prediabetes: Secondary | ICD-10-CM | POA: Diagnosis not present

## 2016-09-16 DIAGNOSIS — I110 Hypertensive heart disease with heart failure: Secondary | ICD-10-CM | POA: Diagnosis not present

## 2016-09-16 DIAGNOSIS — Z602 Problems related to living alone: Secondary | ICD-10-CM | POA: Diagnosis not present

## 2016-09-16 DIAGNOSIS — I5033 Acute on chronic diastolic (congestive) heart failure: Secondary | ICD-10-CM | POA: Diagnosis not present

## 2016-09-16 DIAGNOSIS — Z9981 Dependence on supplemental oxygen: Secondary | ICD-10-CM | POA: Diagnosis not present

## 2016-09-16 DIAGNOSIS — J449 Chronic obstructive pulmonary disease, unspecified: Secondary | ICD-10-CM | POA: Diagnosis not present

## 2016-09-16 DIAGNOSIS — K5190 Ulcerative colitis, unspecified, without complications: Secondary | ICD-10-CM | POA: Diagnosis not present

## 2016-09-16 DIAGNOSIS — I87393 Chronic venous hypertension (idiopathic) with other complications of bilateral lower extremity: Secondary | ICD-10-CM | POA: Diagnosis not present

## 2016-09-16 DIAGNOSIS — E876 Hypokalemia: Secondary | ICD-10-CM | POA: Diagnosis not present

## 2016-09-16 DIAGNOSIS — Z87891 Personal history of nicotine dependence: Secondary | ICD-10-CM | POA: Diagnosis not present

## 2016-09-18 ENCOUNTER — Encounter: Payer: Self-pay | Admitting: Cardiology

## 2016-09-18 ENCOUNTER — Ambulatory Visit (INDEPENDENT_AMBULATORY_CARE_PROVIDER_SITE_OTHER): Payer: Medicare Other | Admitting: Cardiology

## 2016-09-18 VITALS — BP 106/50 | HR 54 | Ht 63.0 in | Wt 291.8 lb

## 2016-09-18 DIAGNOSIS — I952 Hypotension due to drugs: Secondary | ICD-10-CM | POA: Diagnosis not present

## 2016-09-18 DIAGNOSIS — I5033 Acute on chronic diastolic (congestive) heart failure: Secondary | ICD-10-CM | POA: Diagnosis not present

## 2016-09-18 DIAGNOSIS — E876 Hypokalemia: Secondary | ICD-10-CM

## 2016-09-18 DIAGNOSIS — I471 Supraventricular tachycardia: Secondary | ICD-10-CM

## 2016-09-18 MED ORDER — FUROSEMIDE 80 MG PO TABS: 80.0000 mg | ORAL_TABLET | Freq: Two times a day (BID) | ORAL | 3 refills | Status: DC

## 2016-09-18 MED ORDER — CARVEDILOL 6.25 MG PO TABS: 6.2500 mg | ORAL_TABLET | Freq: Two times a day (BID) | ORAL | 3 refills | Status: DC
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
## 2016-09-18 NOTE — Patient Instructions (Addendum)
Medication Instructions:  DECREASE carvedilol (Coreg) to 6.25 mg twice daily.  Labwork: LABS TODAY: BMET, proBNP  Testing/Procedures: None ordered  Follow-Up: Your physician recommends that you schedule a follow-up appointment in: 3-4 weeks with Dr. Meda Coffee or APP on team.   Any Other Special Instructions Will Be Listed Below (If Applicable).     If you need a refill on your cardiac medications before your next appointment, please call your pharmacy.

## 2016-09-19 DIAGNOSIS — J449 Chronic obstructive pulmonary disease, unspecified: Secondary | ICD-10-CM | POA: Diagnosis not present

## 2016-09-19 DIAGNOSIS — Z602 Problems related to living alone: Secondary | ICD-10-CM | POA: Diagnosis not present

## 2016-09-19 DIAGNOSIS — I5033 Acute on chronic diastolic (congestive) heart failure: Secondary | ICD-10-CM | POA: Diagnosis not present

## 2016-09-19 DIAGNOSIS — I87393 Chronic venous hypertension (idiopathic) with other complications of bilateral lower extremity: Secondary | ICD-10-CM | POA: Diagnosis not present

## 2016-09-19 DIAGNOSIS — I110 Hypertensive heart disease with heart failure: Secondary | ICD-10-CM | POA: Diagnosis not present

## 2016-09-19 DIAGNOSIS — Z87891 Personal history of nicotine dependence: Secondary | ICD-10-CM | POA: Diagnosis not present

## 2016-09-19 DIAGNOSIS — E876 Hypokalemia: Secondary | ICD-10-CM | POA: Diagnosis not present

## 2016-09-19 DIAGNOSIS — R7303 Prediabetes: Secondary | ICD-10-CM | POA: Diagnosis not present

## 2016-09-19 DIAGNOSIS — I471 Supraventricular tachycardia: Secondary | ICD-10-CM | POA: Diagnosis not present

## 2016-09-19 DIAGNOSIS — K5190 Ulcerative colitis, unspecified, without complications: Secondary | ICD-10-CM | POA: Diagnosis not present

## 2016-09-19 DIAGNOSIS — Z9981 Dependence on supplemental oxygen: Secondary | ICD-10-CM | POA: Diagnosis not present

## 2016-09-19 LAB — BASIC METABOLIC PANEL
BUN/Creatinine Ratio: 21 (ref 12–28)
BUN: 20 mg/dL (ref 8–27)
CO2: 29 mmol/L (ref 18–29)
Calcium: 9.5 mg/dL (ref 8.7–10.3)
Chloride: 96 mmol/L (ref 96–106)
Creatinine, Ser: 0.96 mg/dL (ref 0.57–1.00)
GFR calc Af Amer: 68 mL/min/{1.73_m2} (ref >59)
GFR calc non Af Amer: 59 mL/min/{1.73_m2} — ABNORMAL LOW (ref >59)
Glucose: 96 mg/dL (ref 65–99)
Potassium: 4.4 mmol/L (ref 3.5–5.2)
Sodium: 143 mmol/L (ref 134–144)

## 2016-09-19 LAB — PRO B NATRIURETIC PEPTIDE: NT-Pro BNP: 528 pg/mL — ABNORMAL HIGH (ref 0–301)

## 2016-09-20 ENCOUNTER — Telehealth: Payer: Self-pay | Admitting: *Deleted

## 2016-09-20 MED ORDER — METOLAZONE 2.5 MG PO TABS: 2.5000 mg | ORAL_TABLET | ORAL | 1 refills | Status: DC

## 2016-09-20 NOTE — Telephone Encounter (Signed)
-----   Message from Dorothy Spark, MD sent at 09/19/2016 10:47 AM EDT ----- Normal K and Crea, I would add metolazone once weekly to her regimen.

## 2016-09-20 NOTE — Telephone Encounter (Signed)
Notified the pt that per Dr Meda Coffee, her labs showed that her K and creatinine were normal, and she recommends that we add metolazone 2.5 mg po once weekly to her regimen.  Confirmed the pharmacy of choice with the pt.  Pt states she will not be able to pick this med up until tomorrow.  Advised the pt to start this regimen tomorrow, and take this only once weekly, every Saturday, 30 mins prior to taking her morning dose of lasix.  Pt verbalized understanding and agrees with this plan.

## 2016-09-23 ENCOUNTER — Ambulatory Visit: Payer: Medicare Other

## 2016-09-23 DIAGNOSIS — I5030 Unspecified diastolic (congestive) heart failure: Secondary | ICD-10-CM | POA: Diagnosis not present

## 2016-09-24 ENCOUNTER — Other Ambulatory Visit: Payer: Self-pay | Admitting: *Deleted

## 2016-09-24 ENCOUNTER — Ambulatory Visit: Payer: Medicare Other

## 2016-09-24 NOTE — Patient Outreach (Signed)
[place] [facility]) Care Management  09/24/2016  Jamie Montoya March 06, 1946 [id number]  Follow up call to patient:  Spoke with patient who advised that she completed cardiology appointment on  09/18/2016. States she has been referred to another heart specialist by current heart MD and does plan to attend appointment.  States she was prescribed a new medication for fluid and is taking as prescribed  as well as other medications that she is on. States she uses mail order & local pharmacy for prescription medications.   States she continues to weigh & record weight daily. Knows what & when she needs to report to her MD as it relates to weight gain. States she maintains a low salt diet. Continues to use oxygen 24/7 as prescribed.    Patient voices that she wishes to discontinue services of [facility] care management. States she feels comfortable  Managing her heart failure and knows when to call her MD office or 911 depending on what is going on. States she knows [facility] contact number and will call if she needs further case management.   Plan: Send MD closure letter. Send to case management assistant to close case.  Sherrin Daisy, RN BSN [place] Management Coordinator [facility] Care Management  [phone]

## 2016-09-25 ENCOUNTER — Encounter: Payer: Self-pay | Admitting: *Deleted

## 2016-09-25 DIAGNOSIS — I87393 Chronic venous hypertension (idiopathic) with other complications of bilateral lower extremity: Secondary | ICD-10-CM | POA: Diagnosis not present

## 2016-09-25 DIAGNOSIS — I471 Supraventricular tachycardia: Secondary | ICD-10-CM | POA: Diagnosis not present

## 2016-09-25 DIAGNOSIS — Z602 Problems related to living alone: Secondary | ICD-10-CM | POA: Diagnosis not present

## 2016-09-25 DIAGNOSIS — Z9981 Dependence on supplemental oxygen: Secondary | ICD-10-CM | POA: Diagnosis not present

## 2016-09-25 DIAGNOSIS — R7303 Prediabetes: Secondary | ICD-10-CM | POA: Diagnosis not present

## 2016-09-25 DIAGNOSIS — Z87891 Personal history of nicotine dependence: Secondary | ICD-10-CM | POA: Diagnosis not present

## 2016-09-25 DIAGNOSIS — J449 Chronic obstructive pulmonary disease, unspecified: Secondary | ICD-10-CM | POA: Diagnosis not present

## 2016-09-25 DIAGNOSIS — E876 Hypokalemia: Secondary | ICD-10-CM | POA: Diagnosis not present

## 2016-09-25 DIAGNOSIS — I110 Hypertensive heart disease with heart failure: Secondary | ICD-10-CM | POA: Diagnosis not present

## 2016-09-25 DIAGNOSIS — I5033 Acute on chronic diastolic (congestive) heart failure: Secondary | ICD-10-CM | POA: Diagnosis not present

## 2016-09-25 DIAGNOSIS — K5190 Ulcerative colitis, unspecified, without complications: Secondary | ICD-10-CM | POA: Diagnosis not present

## 2016-09-27 ENCOUNTER — Encounter: Payer: Self-pay | Admitting: Cardiology

## 2016-10-03 DIAGNOSIS — E876 Hypokalemia: Secondary | ICD-10-CM | POA: Diagnosis not present

## 2016-10-03 DIAGNOSIS — Z87891 Personal history of nicotine dependence: Secondary | ICD-10-CM | POA: Diagnosis not present

## 2016-10-03 DIAGNOSIS — I110 Hypertensive heart disease with heart failure: Secondary | ICD-10-CM | POA: Diagnosis not present

## 2016-10-03 DIAGNOSIS — I5033 Acute on chronic diastolic (congestive) heart failure: Secondary | ICD-10-CM | POA: Diagnosis not present

## 2016-10-03 DIAGNOSIS — I87393 Chronic venous hypertension (idiopathic) with other complications of bilateral lower extremity: Secondary | ICD-10-CM | POA: Diagnosis not present

## 2016-10-03 DIAGNOSIS — J449 Chronic obstructive pulmonary disease, unspecified: Secondary | ICD-10-CM | POA: Diagnosis not present

## 2016-10-03 DIAGNOSIS — K5190 Ulcerative colitis, unspecified, without complications: Secondary | ICD-10-CM | POA: Diagnosis not present

## 2016-10-03 DIAGNOSIS — I471 Supraventricular tachycardia: Secondary | ICD-10-CM | POA: Diagnosis not present

## 2016-10-03 DIAGNOSIS — Z602 Problems related to living alone: Secondary | ICD-10-CM | POA: Diagnosis not present

## 2016-10-03 DIAGNOSIS — R7303 Prediabetes: Secondary | ICD-10-CM | POA: Diagnosis not present

## 2016-10-03 DIAGNOSIS — Z9981 Dependence on supplemental oxygen: Secondary | ICD-10-CM | POA: Diagnosis not present

## 2016-10-07 ENCOUNTER — Encounter: Payer: Self-pay | Admitting: Cardiology

## 2016-10-07 ENCOUNTER — Encounter (INDEPENDENT_AMBULATORY_CARE_PROVIDER_SITE_OTHER): Payer: Self-pay

## 2016-10-07 ENCOUNTER — Ambulatory Visit (INDEPENDENT_AMBULATORY_CARE_PROVIDER_SITE_OTHER): Payer: Medicare Other | Admitting: Cardiology

## 2016-10-07 VITALS — BP 110/54 | HR 66 | Ht 63.0 in | Wt 282.6 lb

## 2016-10-07 DIAGNOSIS — I471 Supraventricular tachycardia: Secondary | ICD-10-CM
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
## 2016-10-07 NOTE — Patient Instructions (Signed)
Medication Instructions:    Your physician recommends that you continue on your current medications as directed. Please refer to the Current Medication list given to you today.  - If you need a refill on your cardiac medications before your next appointment, please call your pharmacy.   Labwork:  None ordered  Testing/Procedures:  None ordered  Follow-Up:  Your physician recommends that you schedule a follow-up appointment in: 3 months with Dr. Camnitz.  Thank you for choosing [facility]!!   Youlanda Tomassetti, RN [phone]         

## 2016-10-16 ENCOUNTER — Encounter: Payer: Self-pay | Admitting: Cardiology

## 2016-10-16 ENCOUNTER — Ambulatory Visit (INDEPENDENT_AMBULATORY_CARE_PROVIDER_SITE_OTHER): Payer: Medicare Other | Admitting: Cardiology

## 2016-10-16 VITALS — BP 116/52 | HR 62 | Ht 63.0 in | Wt 283.8 lb

## 2016-10-16 DIAGNOSIS — I5032 Chronic diastolic (congestive) heart failure: Secondary | ICD-10-CM

## 2016-10-16 DIAGNOSIS — Z79899 Other long term (current) drug therapy: Secondary | ICD-10-CM
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
## 2016-10-16 NOTE — Patient Instructions (Addendum)
Your physician recommends that you continue on your current medications as directed. Please refer to the Current Medication list given to you today.  Your physician recommends that you return for lab work TODAY - BMET  Keep follow up with Dr. Meda Coffee on June 13 @ 11:40am  Weigh daily. Call [phone] if weight increases more than 3 pounds in a day or 5 pounds in a week.

## 2016-10-17 LAB — BASIC METABOLIC PANEL
BUN/Creatinine Ratio: 23 (ref 12–28)
BUN: 25 mg/dL (ref 8–27)
CALCIUM: 9.6 mg/dL (ref 8.7–10.3)
CHLORIDE: 93 mmol/L — AB (ref 96–106)
CO2: 34 mmol/L — AB (ref 18–29)
Creatinine, Ser: 1.11 mg/dL — ABNORMAL HIGH (ref 0.57–1.00)
GFR calc Af Amer: 57 mL/min/{1.73_m2} — ABNORMAL LOW (ref >59)
GFR, EST NON AFRICAN AMERICAN: 50 mL/min/{1.73_m2} — AB (ref >59)
Glucose: 88 mg/dL (ref 65–99)
Potassium: 3.8 mmol/L (ref 3.5–5.2)
Sodium: 142 mmol/L (ref 134–144)

## 2016-10-23 ENCOUNTER — Other Ambulatory Visit: Payer: Self-pay | Admitting: Internal Medicine

## 2016-10-23 DIAGNOSIS — I5030 Unspecified diastolic (congestive) heart failure: Secondary | ICD-10-CM | POA: Diagnosis not present

## 2016-11-19 ENCOUNTER — Other Ambulatory Visit: Payer: Self-pay | Admitting: *Deleted

## 2016-11-19 MED ORDER — METOLAZONE 2.5 MG PO TABS: 2.5000 mg | ORAL_TABLET | ORAL | 3 refills | Status: DC

## 2016-11-20 DIAGNOSIS — E039 Hypothyroidism, unspecified: Secondary | ICD-10-CM | POA: Diagnosis not present

## 2016-11-20 DIAGNOSIS — I5030 Unspecified diastolic (congestive) heart failure: Secondary | ICD-10-CM | POA: Diagnosis not present

## 2016-11-20 DIAGNOSIS — E782 Mixed hyperlipidemia: Secondary | ICD-10-CM | POA: Diagnosis not present

## 2016-11-20 DIAGNOSIS — Z79899 Other long term (current) drug therapy: Secondary | ICD-10-CM | POA: Diagnosis not present

## 2016-11-20 DIAGNOSIS — J449 Chronic obstructive pulmonary disease, unspecified: Secondary | ICD-10-CM | POA: Diagnosis not present

## 2016-11-20 DIAGNOSIS — R600 Localized edema: Secondary | ICD-10-CM | POA: Diagnosis not present

## 2016-11-23 DIAGNOSIS — I5030 Unspecified diastolic (congestive) heart failure: Secondary | ICD-10-CM | POA: Diagnosis not present

## 2016-12-04 ENCOUNTER — Encounter: Payer: Self-pay | Admitting: Cardiology

## 2016-12-04 ENCOUNTER — Ambulatory Visit (INDEPENDENT_AMBULATORY_CARE_PROVIDER_SITE_OTHER): Payer: Medicare Other | Admitting: Cardiology

## 2016-12-04 VITALS — BP 126/86 | HR 81 | Ht 63.0 in | Wt 289.0 lb

## 2016-12-04 DIAGNOSIS — I471 Supraventricular tachycardia: Secondary | ICD-10-CM

## 2016-12-04 DIAGNOSIS — L03115 Cellulitis of right lower limb: Secondary | ICD-10-CM | POA: Diagnosis not present

## 2016-12-04 DIAGNOSIS — I5033 Acute on chronic diastolic (congestive) heart failure: Secondary | ICD-10-CM

## 2016-12-04 DIAGNOSIS — L03116 Cellulitis of left lower limb: Secondary | ICD-10-CM | POA: Diagnosis not present

## 2016-12-04 DIAGNOSIS — I5032 Chronic diastolic (congestive) heart failure: Secondary | ICD-10-CM | POA: Diagnosis not present

## 2016-12-04 MED ORDER — METOLAZONE 2.5 MG PO TABS: 2.5000 mg | ORAL_TABLET | Freq: Every day | ORAL | 1 refills | Status: DC

## 2016-12-04 MED ORDER — CEPHALEXIN 500 MG PO CAPS: 500.0000 mg | ORAL_CAPSULE | Freq: Two times a day (BID) | ORAL | 0 refills | Status: DC

## 2016-12-04 MED ORDER — FUROSEMIDE 80 MG PO TABS: 160.0000 mg | ORAL_TABLET | Freq: Two times a day (BID) | ORAL | 1 refills | Status: DC
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
## 2016-12-04 NOTE — Patient Instructions (Signed)
Medication Instructions:   INCREASE YOUR LASIX TO 160 MG TWICE DAILY  INCREASE YOUR METOLAZONE TO 2.5 MG ONCE DAILY---TAKE THIS 30 MINUTES PRIOR TO YOUR MORNING DOSE OF LASIX  START TAKING KEFLEX 500 MG BY MOUTH TWICE DAILY FOR 5 DAYS ONLY     Labwork:  TODAY---BMET, CBC W DIFF, AND PRO-BNP      Follow-Up:  4 WEEKS WITH AN EXTENDER       If you need a refill on your cardiac medications before your next appointment, please call your pharmacy.

## 2016-12-05 LAB — CBC WITH DIFFERENTIAL/PLATELET
Basophils Absolute: 0 10*3/uL (ref 0.0–0.2)
Basos: 0 %
EOS (ABSOLUTE): 0.1 10*3/uL (ref 0.0–0.4)
Eos: 2 %
Hematocrit: 30.7 % — ABNORMAL LOW (ref 34.0–46.6)
Hemoglobin: 9.2 g/dL — ABNORMAL LOW (ref 11.1–15.9)
Immature Grans (Abs): 0 10*3/uL (ref 0.0–0.1)
Immature Granulocytes: 0 %
Lymphocytes Absolute: 1.3 10*3/uL (ref 0.7–3.1)
Lymphs: 25 %
MCH: 29.6 pg (ref 26.6–33.0)
MCHC: 30 g/dL — ABNORMAL LOW (ref 31.5–35.7)
MCV: 99 fL — ABNORMAL HIGH (ref 79–97)
Monocytes Absolute: 0.6 10*3/uL (ref 0.1–0.9)
Monocytes: 11 %
Neutrophils Absolute: 3.1 10*3/uL (ref 1.4–7.0)
Neutrophils: 62 %
Platelets: 257 10*3/uL (ref 150–379)
RBC: 3.11 x10E6/uL — ABNORMAL LOW (ref 3.77–5.28)
RDW: 14.8 % (ref 12.3–15.4)
WBC: 5.1 10*3/uL (ref 3.4–10.8)

## 2016-12-05 LAB — BASIC METABOLIC PANEL
BUN/Creatinine Ratio: 24 (ref 12–28)
BUN: 21 mg/dL (ref 8–27)
CO2: 30 mmol/L — ABNORMAL HIGH (ref 20–29)
Calcium: 9.2 mg/dL (ref 8.7–10.3)
Chloride: 95 mmol/L — ABNORMAL LOW (ref 96–106)
Creatinine, Ser: 0.89 mg/dL (ref 0.57–1.00)
GFR calc Af Amer: 75 mL/min/{1.73_m2} (ref >59)
GFR calc non Af Amer: 65 mL/min/{1.73_m2} (ref >59)
Glucose: 87 mg/dL (ref 65–99)
Potassium: 4.1 mmol/L (ref 3.5–5.2)
Sodium: 140 mmol/L (ref 134–144)

## 2016-12-05 LAB — PRO B NATRIURETIC PEPTIDE: NT-Pro BNP: 600 pg/mL — ABNORMAL HIGH (ref 0–301)

## 2016-12-06 ENCOUNTER — Other Ambulatory Visit: Payer: Self-pay | Admitting: *Deleted

## 2016-12-06 MED ORDER — SPIRONOLACTONE 25 MG PO TABS: 25.0000 mg | ORAL_TABLET | Freq: Every day | ORAL | 3 refills | Status: DC

## 2016-12-16 ENCOUNTER — Telehealth: Payer: Self-pay | Admitting: Physician Assistant

## 2016-12-16 DIAGNOSIS — I87303 Chronic venous hypertension (idiopathic) without complications of bilateral lower extremity: Secondary | ICD-10-CM | POA: Diagnosis not present

## 2016-12-16 DIAGNOSIS — I5030 Unspecified diastolic (congestive) heart failure: Secondary | ICD-10-CM | POA: Diagnosis not present

## 2016-12-16 DIAGNOSIS — L03116 Cellulitis of left lower limb: Secondary | ICD-10-CM | POA: Diagnosis not present

## 2016-12-16 DIAGNOSIS — J449 Chronic obstructive pulmonary disease, unspecified: Secondary | ICD-10-CM | POA: Diagnosis not present

## 2016-12-16 NOTE — Telephone Encounter (Signed)
Pt calling to ask Dr Meda Coffee what she should do about her cellulitis of her leg.  Pt states that her leg is much more red in appearance and is now oozing.  Pt states that she has finished all her Keflex, and her leg has worsened in appearance. Pt was placed on Keflex 500 mg po bid at her last OV with Dr Meda Coffee. Pt states that the Keflex also caused her to feel dizzy and have diarrhea.  Pt would like for Dr Meda Coffee to advise on what she should do about her leg.  Advised the pt that given she has completed all her antibiotics, and her leg is worsening in appearance, and is now oozing, she should refer to her PCP for further evaluation and treatment of this issue.  Advised the pt to call her PCP now and see if she can obtain an appt today or tomorrow.  Advised the pt that if she is unable to obtain an appt, then she should refer to an Urgent Care, for further eval. Pt verbalized understanding and agrees with this plan.  Will forward this message to Dr Meda Coffee as a general

## 2016-12-16 NOTE — Telephone Encounter (Signed)
This is Dr. Francesca Oman patient.  Will route accordingly.

## 2016-12-16 NOTE — Telephone Encounter (Signed)
Pt c/o medication issue:  1. Name of Medication: unknown it starts with a C and she has taken it all 540m for 5 days  2. How are you currently taking this medication (dosage and times per day)? Unknown   3. Are you having a reaction (difficulty breathing--STAT)? no  4. What is your medication issue? Dizzy  Pt left leg is draining and it is still infected

## 2016-12-18 MED ORDER — CARVEDILOL 12.5 MG PO TABS
12.5000 mg | ORAL_TABLET | Freq: Two times a day (BID) | ORAL | 2 refills | Status: DC
Start: 2016-12-18 — End: 2017-01-28

## 2016-12-18 NOTE — Telephone Encounter (Signed)
New Message ° ° pt verbalized that she is returning call for rn °

## 2016-12-18 NOTE — Telephone Encounter (Signed)
Pt calling to request a 90 day supply of her carvedilol to be sent to her mail order Pharmacy, [facility].  Informed the pt that I will send this in now, as requested.  Pt verbalized understanding gracious for all the assistance provided.

## 2016-12-19 DIAGNOSIS — I110 Hypertensive heart disease with heart failure: Secondary | ICD-10-CM | POA: Diagnosis not present

## 2016-12-19 DIAGNOSIS — L03115 Cellulitis of right lower limb: Secondary | ICD-10-CM | POA: Diagnosis not present

## 2016-12-19 DIAGNOSIS — I5020 Unspecified systolic (congestive) heart failure: Secondary | ICD-10-CM | POA: Diagnosis not present

## 2016-12-19 DIAGNOSIS — I87393 Chronic venous hypertension (idiopathic) with other complications of bilateral lower extremity: Secondary | ICD-10-CM | POA: Diagnosis not present

## 2016-12-19 DIAGNOSIS — L97819 Non-pressure chronic ulcer of other part of right lower leg with unspecified severity: Secondary | ICD-10-CM | POA: Diagnosis not present

## 2016-12-19 DIAGNOSIS — L03116 Cellulitis of left lower limb: Secondary | ICD-10-CM | POA: Diagnosis not present

## 2016-12-19 DIAGNOSIS — I5030 Unspecified diastolic (congestive) heart failure: Secondary | ICD-10-CM | POA: Diagnosis not present

## 2016-12-19 DIAGNOSIS — J449 Chronic obstructive pulmonary disease, unspecified: Secondary | ICD-10-CM | POA: Diagnosis not present

## 2016-12-19 DIAGNOSIS — Z602 Problems related to living alone: Secondary | ICD-10-CM | POA: Diagnosis not present

## 2016-12-19 DIAGNOSIS — Z9981 Dependence on supplemental oxygen: Secondary | ICD-10-CM | POA: Diagnosis not present

## 2016-12-19 DIAGNOSIS — Z87891 Personal history of nicotine dependence: Secondary | ICD-10-CM | POA: Diagnosis not present

## 2016-12-23 DIAGNOSIS — I5020 Unspecified systolic (congestive) heart failure: Secondary | ICD-10-CM | POA: Diagnosis not present

## 2016-12-23 DIAGNOSIS — I5030 Unspecified diastolic (congestive) heart failure: Secondary | ICD-10-CM | POA: Diagnosis not present

## 2016-12-23 DIAGNOSIS — Z9981 Dependence on supplemental oxygen: Secondary | ICD-10-CM | POA: Diagnosis not present

## 2016-12-23 DIAGNOSIS — L03116 Cellulitis of left lower limb: Secondary | ICD-10-CM | POA: Diagnosis not present

## 2016-12-23 DIAGNOSIS — J449 Chronic obstructive pulmonary disease, unspecified: Secondary | ICD-10-CM | POA: Diagnosis not present

## 2016-12-23 DIAGNOSIS — Z602 Problems related to living alone: Secondary | ICD-10-CM | POA: Diagnosis not present

## 2016-12-23 DIAGNOSIS — I110 Hypertensive heart disease with heart failure: Secondary | ICD-10-CM | POA: Diagnosis not present

## 2016-12-23 DIAGNOSIS — L97819 Non-pressure chronic ulcer of other part of right lower leg with unspecified severity: Secondary | ICD-10-CM | POA: Diagnosis not present

## 2016-12-23 DIAGNOSIS — Z87891 Personal history of nicotine dependence: Secondary | ICD-10-CM | POA: Diagnosis not present

## 2016-12-23 DIAGNOSIS — I87393 Chronic venous hypertension (idiopathic) with other complications of bilateral lower extremity: Secondary | ICD-10-CM | POA: Diagnosis not present

## 2016-12-23 DIAGNOSIS — L03115 Cellulitis of right lower limb: Secondary | ICD-10-CM | POA: Diagnosis not present

## 2016-12-26 DIAGNOSIS — Z87891 Personal history of nicotine dependence: Secondary | ICD-10-CM | POA: Diagnosis not present

## 2016-12-26 DIAGNOSIS — I5030 Unspecified diastolic (congestive) heart failure: Secondary | ICD-10-CM | POA: Diagnosis not present

## 2016-12-26 DIAGNOSIS — Z602 Problems related to living alone: Secondary | ICD-10-CM | POA: Diagnosis not present

## 2016-12-26 DIAGNOSIS — I5020 Unspecified systolic (congestive) heart failure: Secondary | ICD-10-CM | POA: Diagnosis not present

## 2016-12-26 DIAGNOSIS — I110 Hypertensive heart disease with heart failure: Secondary | ICD-10-CM | POA: Diagnosis not present

## 2016-12-26 DIAGNOSIS — J449 Chronic obstructive pulmonary disease, unspecified: Secondary | ICD-10-CM | POA: Diagnosis not present

## 2016-12-26 DIAGNOSIS — Z9981 Dependence on supplemental oxygen: Secondary | ICD-10-CM | POA: Diagnosis not present

## 2016-12-26 DIAGNOSIS — L03115 Cellulitis of right lower limb: Secondary | ICD-10-CM | POA: Diagnosis not present

## 2016-12-26 DIAGNOSIS — L03116 Cellulitis of left lower limb: Secondary | ICD-10-CM | POA: Diagnosis not present

## 2016-12-26 DIAGNOSIS — I87393 Chronic venous hypertension (idiopathic) with other complications of bilateral lower extremity: Secondary | ICD-10-CM | POA: Diagnosis not present

## 2016-12-26 DIAGNOSIS — L97819 Non-pressure chronic ulcer of other part of right lower leg with unspecified severity: Secondary | ICD-10-CM | POA: Diagnosis not present

## 2016-12-30 DIAGNOSIS — J449 Chronic obstructive pulmonary disease, unspecified: Secondary | ICD-10-CM | POA: Diagnosis not present

## 2016-12-30 DIAGNOSIS — L03116 Cellulitis of left lower limb: Secondary | ICD-10-CM | POA: Diagnosis not present

## 2016-12-30 DIAGNOSIS — I5030 Unspecified diastolic (congestive) heart failure: Secondary | ICD-10-CM | POA: Diagnosis not present

## 2016-12-30 DIAGNOSIS — L03115 Cellulitis of right lower limb: Secondary | ICD-10-CM | POA: Diagnosis not present

## 2016-12-30 DIAGNOSIS — I87393 Chronic venous hypertension (idiopathic) with other complications of bilateral lower extremity: Secondary | ICD-10-CM | POA: Diagnosis not present

## 2016-12-30 DIAGNOSIS — I5020 Unspecified systolic (congestive) heart failure: Secondary | ICD-10-CM | POA: Diagnosis not present

## 2016-12-30 DIAGNOSIS — L97819 Non-pressure chronic ulcer of other part of right lower leg with unspecified severity: Secondary | ICD-10-CM | POA: Diagnosis not present

## 2016-12-30 DIAGNOSIS — Z87891 Personal history of nicotine dependence: Secondary | ICD-10-CM | POA: Diagnosis not present

## 2016-12-30 DIAGNOSIS — I110 Hypertensive heart disease with heart failure: Secondary | ICD-10-CM | POA: Diagnosis not present

## 2016-12-30 DIAGNOSIS — Z9981 Dependence on supplemental oxygen: Secondary | ICD-10-CM | POA: Diagnosis not present

## 2016-12-30 DIAGNOSIS — Z602 Problems related to living alone: Secondary | ICD-10-CM | POA: Diagnosis not present

## 2016-12-31 ENCOUNTER — Other Ambulatory Visit: Payer: Self-pay

## 2016-12-31 DIAGNOSIS — R600 Localized edema: Secondary | ICD-10-CM

## 2017-01-02 ENCOUNTER — Ambulatory Visit: Payer: Medicare Other | Admitting: Physician Assistant

## 2017-01-02 DIAGNOSIS — L03115 Cellulitis of right lower limb: Secondary | ICD-10-CM | POA: Diagnosis not present

## 2017-01-02 DIAGNOSIS — Z87891 Personal history of nicotine dependence: Secondary | ICD-10-CM | POA: Diagnosis not present

## 2017-01-02 DIAGNOSIS — L97819 Non-pressure chronic ulcer of other part of right lower leg with unspecified severity: Secondary | ICD-10-CM | POA: Diagnosis not present

## 2017-01-02 DIAGNOSIS — I110 Hypertensive heart disease with heart failure: Secondary | ICD-10-CM | POA: Diagnosis not present

## 2017-01-02 DIAGNOSIS — I5030 Unspecified diastolic (congestive) heart failure: Secondary | ICD-10-CM | POA: Diagnosis not present

## 2017-01-02 DIAGNOSIS — J449 Chronic obstructive pulmonary disease, unspecified: Secondary | ICD-10-CM | POA: Diagnosis not present

## 2017-01-02 DIAGNOSIS — Z602 Problems related to living alone: Secondary | ICD-10-CM | POA: Diagnosis not present

## 2017-01-02 DIAGNOSIS — L03116 Cellulitis of left lower limb: Secondary | ICD-10-CM | POA: Diagnosis not present

## 2017-01-02 DIAGNOSIS — Z9981 Dependence on supplemental oxygen: Secondary | ICD-10-CM | POA: Diagnosis not present

## 2017-01-02 DIAGNOSIS — I87393 Chronic venous hypertension (idiopathic) with other complications of bilateral lower extremity: Secondary | ICD-10-CM | POA: Diagnosis not present

## 2017-01-02 DIAGNOSIS — I5020 Unspecified systolic (congestive) heart failure: Secondary | ICD-10-CM | POA: Diagnosis not present

## 2017-01-06 ENCOUNTER — Ambulatory Visit: Payer: Medicare Other | Admitting: Cardiology

## 2017-01-07 DIAGNOSIS — I87393 Chronic venous hypertension (idiopathic) with other complications of bilateral lower extremity: Secondary | ICD-10-CM | POA: Diagnosis not present

## 2017-01-07 DIAGNOSIS — Z9981 Dependence on supplemental oxygen: Secondary | ICD-10-CM | POA: Diagnosis not present

## 2017-01-07 DIAGNOSIS — I110 Hypertensive heart disease with heart failure: Secondary | ICD-10-CM | POA: Diagnosis not present

## 2017-01-07 DIAGNOSIS — Z87891 Personal history of nicotine dependence: Secondary | ICD-10-CM | POA: Diagnosis not present

## 2017-01-07 DIAGNOSIS — I5020 Unspecified systolic (congestive) heart failure: Secondary | ICD-10-CM | POA: Diagnosis not present

## 2017-01-07 DIAGNOSIS — I5030 Unspecified diastolic (congestive) heart failure: Secondary | ICD-10-CM | POA: Diagnosis not present

## 2017-01-07 DIAGNOSIS — Z602 Problems related to living alone: Secondary | ICD-10-CM | POA: Diagnosis not present

## 2017-01-07 DIAGNOSIS — L03116 Cellulitis of left lower limb: Secondary | ICD-10-CM | POA: Diagnosis not present

## 2017-01-07 DIAGNOSIS — J449 Chronic obstructive pulmonary disease, unspecified: Secondary | ICD-10-CM | POA: Diagnosis not present

## 2017-01-07 DIAGNOSIS — L97819 Non-pressure chronic ulcer of other part of right lower leg with unspecified severity: Secondary | ICD-10-CM | POA: Diagnosis not present

## 2017-01-07 DIAGNOSIS — L03115 Cellulitis of right lower limb: Secondary | ICD-10-CM | POA: Diagnosis not present

## 2017-01-09 ENCOUNTER — Encounter: Payer: Self-pay | Admitting: Cardiology

## 2017-01-09 ENCOUNTER — Ambulatory Visit (INDEPENDENT_AMBULATORY_CARE_PROVIDER_SITE_OTHER): Payer: Medicare Other | Admitting: Cardiology

## 2017-01-09 VITALS — BP 106/64 | HR 67 | Ht 64.0 in | Wt 287.6 lb

## 2017-01-09 DIAGNOSIS — R531 Weakness: Secondary | ICD-10-CM

## 2017-01-09 DIAGNOSIS — Z9981 Dependence on supplemental oxygen: Secondary | ICD-10-CM | POA: Diagnosis not present

## 2017-01-09 DIAGNOSIS — R42 Dizziness and giddiness: Secondary | ICD-10-CM

## 2017-01-09 DIAGNOSIS — I5032 Chronic diastolic (congestive) heart failure: Secondary | ICD-10-CM

## 2017-01-09 DIAGNOSIS — L03115 Cellulitis of right lower limb: Secondary | ICD-10-CM | POA: Diagnosis not present

## 2017-01-09 DIAGNOSIS — I471 Supraventricular tachycardia: Secondary | ICD-10-CM | POA: Diagnosis not present

## 2017-01-09 DIAGNOSIS — L03116 Cellulitis of left lower limb: Secondary | ICD-10-CM | POA: Diagnosis not present

## 2017-01-09 DIAGNOSIS — Z602 Problems related to living alone: Secondary | ICD-10-CM | POA: Diagnosis not present

## 2017-01-09 DIAGNOSIS — J449 Chronic obstructive pulmonary disease, unspecified: Secondary | ICD-10-CM | POA: Diagnosis not present

## 2017-01-09 DIAGNOSIS — I5030 Unspecified diastolic (congestive) heart failure: Secondary | ICD-10-CM | POA: Diagnosis not present

## 2017-01-09 DIAGNOSIS — L97819 Non-pressure chronic ulcer of other part of right lower leg with unspecified severity: Secondary | ICD-10-CM | POA: Diagnosis not present

## 2017-01-09 DIAGNOSIS — I110 Hypertensive heart disease with heart failure: Secondary | ICD-10-CM | POA: Diagnosis not present

## 2017-01-09 DIAGNOSIS — I5020 Unspecified systolic (congestive) heart failure: Secondary | ICD-10-CM | POA: Diagnosis not present

## 2017-01-09 DIAGNOSIS — Z87891 Personal history of nicotine dependence: Secondary | ICD-10-CM | POA: Diagnosis not present

## 2017-01-09 DIAGNOSIS — I87393 Chronic venous hypertension (idiopathic) with other complications of bilateral lower extremity: Secondary | ICD-10-CM | POA: Diagnosis not present

## 2017-01-09 MED ORDER — METOPROLOL TARTRATE 50 MG PO TABS: 50.0000 mg | ORAL_TABLET | Freq: Two times a day (BID) | ORAL | 2 refills | Status: DC

## 2017-01-09 MED ORDER — METOPROLOL TARTRATE 50 MG PO TABS: 50.0000 mg | ORAL_TABLET | Freq: Two times a day (BID) | ORAL | 0 refills | Status: DC

## 2017-01-09 NOTE — Patient Instructions (Signed)
Medication Instructions:   Your physician has recommended you make the following change in your medication:  1) START Metoprolol 50 mg twice a day  - If you need a refill on your cardiac medications before your next appointment, please call your pharmacy.   Labwork:  Today: BMET, BNP & TSH  Testing/Procedures:  None ordered  Follow-Up:  Your physician wants you to follow-up in: 6 months with Dr. Curt Bears.  You will receive a reminder letter in the mail two months in advance. If you don't receive a letter, please call our office to schedule the follow-up appointment.  Thank you for choosing [facility]!!   Trinidad Curet, RN [phone]  Any Other Special Instructions Will Be Listed Below (If Applicable).
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
## 2017-01-10 ENCOUNTER — Other Ambulatory Visit: Payer: Self-pay | Admitting: Cardiology

## 2017-01-10 LAB — BASIC METABOLIC PANEL
BUN/Creatinine Ratio: 19 (ref 12–28)
BUN: 18 mg/dL (ref 8–27)
CO2: 34 mmol/L — AB (ref 20–29)
CREATININE: 0.94 mg/dL (ref 0.57–1.00)
Calcium: 9.5 mg/dL (ref 8.7–10.3)
Chloride: 97 mmol/L (ref 96–106)
GFR, EST AFRICAN AMERICAN: 70 mL/min/{1.73_m2} (ref >59)
GFR, EST NON AFRICAN AMERICAN: 61 mL/min/{1.73_m2} (ref >59)
Glucose: 74 mg/dL (ref 65–99)
Potassium: 4.3 mmol/L (ref 3.5–5.2)
SODIUM: 144 mmol/L (ref 134–144)

## 2017-01-10 LAB — PRO B NATRIURETIC PEPTIDE: NT-Pro BNP: 988 pg/mL — ABNORMAL HIGH (ref 0–301)

## 2017-01-10 LAB — TSH: TSH: 0.138 u[IU]/mL — ABNORMAL LOW (ref 0.450–4.500)

## 2017-01-10 MED ORDER — METOPROLOL TARTRATE 50 MG PO TABS: 50.0000 mg | ORAL_TABLET | Freq: Two times a day (BID) | ORAL | 2 refills | Status: DC

## 2017-01-10 NOTE — Telephone Encounter (Signed)
[facility] mail order requesting a refill of metoprolol. Called pt to see where pt wanted her Rx to be sent. Pt stated that she would like for her medication to be sent to [facility]. I resent medication to the correct pharmacy. Confirmation received.

## 2017-01-13 DIAGNOSIS — I87393 Chronic venous hypertension (idiopathic) with other complications of bilateral lower extremity: Secondary | ICD-10-CM | POA: Diagnosis not present

## 2017-01-13 DIAGNOSIS — Z9981 Dependence on supplemental oxygen: Secondary | ICD-10-CM | POA: Diagnosis not present

## 2017-01-13 DIAGNOSIS — Z602 Problems related to living alone: Secondary | ICD-10-CM | POA: Diagnosis not present

## 2017-01-13 DIAGNOSIS — L03115 Cellulitis of right lower limb: Secondary | ICD-10-CM | POA: Diagnosis not present

## 2017-01-13 DIAGNOSIS — I110 Hypertensive heart disease with heart failure: Secondary | ICD-10-CM | POA: Diagnosis not present

## 2017-01-13 DIAGNOSIS — I5030 Unspecified diastolic (congestive) heart failure: Secondary | ICD-10-CM | POA: Diagnosis not present

## 2017-01-13 DIAGNOSIS — Z87891 Personal history of nicotine dependence: Secondary | ICD-10-CM | POA: Diagnosis not present

## 2017-01-13 DIAGNOSIS — J449 Chronic obstructive pulmonary disease, unspecified: Secondary | ICD-10-CM | POA: Diagnosis not present

## 2017-01-13 DIAGNOSIS — L03116 Cellulitis of left lower limb: Secondary | ICD-10-CM | POA: Diagnosis not present

## 2017-01-13 DIAGNOSIS — I5020 Unspecified systolic (congestive) heart failure: Secondary | ICD-10-CM | POA: Diagnosis not present

## 2017-01-13 DIAGNOSIS — L97819 Non-pressure chronic ulcer of other part of right lower leg with unspecified severity: Secondary | ICD-10-CM | POA: Diagnosis not present

## 2017-01-15 ENCOUNTER — Encounter: Payer: Self-pay | Admitting: Cardiology

## 2017-01-15 NOTE — Telephone Encounter (Signed)
New Message  Pt call requesting to speak with RN about lab results.please call back to discuss

## 2017-01-15 NOTE — Telephone Encounter (Signed)
This encounter was created in error - please disregard.

## 2017-01-17 DIAGNOSIS — Z87891 Personal history of nicotine dependence: Secondary | ICD-10-CM | POA: Diagnosis not present

## 2017-01-17 DIAGNOSIS — L03116 Cellulitis of left lower limb: Secondary | ICD-10-CM | POA: Diagnosis not present

## 2017-01-17 DIAGNOSIS — Z9981 Dependence on supplemental oxygen: Secondary | ICD-10-CM | POA: Diagnosis not present

## 2017-01-17 DIAGNOSIS — I87393 Chronic venous hypertension (idiopathic) with other complications of bilateral lower extremity: Secondary | ICD-10-CM | POA: Diagnosis not present

## 2017-01-17 DIAGNOSIS — Z602 Problems related to living alone: Secondary | ICD-10-CM | POA: Diagnosis not present

## 2017-01-17 DIAGNOSIS — I110 Hypertensive heart disease with heart failure: Secondary | ICD-10-CM | POA: Diagnosis not present

## 2017-01-17 DIAGNOSIS — L03115 Cellulitis of right lower limb: Secondary | ICD-10-CM | POA: Diagnosis not present

## 2017-01-17 DIAGNOSIS — L97819 Non-pressure chronic ulcer of other part of right lower leg with unspecified severity: Secondary | ICD-10-CM | POA: Diagnosis not present

## 2017-01-17 DIAGNOSIS — I5030 Unspecified diastolic (congestive) heart failure: Secondary | ICD-10-CM | POA: Diagnosis not present

## 2017-01-17 DIAGNOSIS — I5020 Unspecified systolic (congestive) heart failure: Secondary | ICD-10-CM | POA: Diagnosis not present

## 2017-01-17 DIAGNOSIS — J449 Chronic obstructive pulmonary disease, unspecified: Secondary | ICD-10-CM | POA: Diagnosis not present

## 2017-01-20 DIAGNOSIS — I87393 Chronic venous hypertension (idiopathic) with other complications of bilateral lower extremity: Secondary | ICD-10-CM | POA: Diagnosis not present

## 2017-01-20 DIAGNOSIS — I5030 Unspecified diastolic (congestive) heart failure: Secondary | ICD-10-CM | POA: Diagnosis not present

## 2017-01-20 DIAGNOSIS — L03115 Cellulitis of right lower limb: Secondary | ICD-10-CM | POA: Diagnosis not present

## 2017-01-20 DIAGNOSIS — I110 Hypertensive heart disease with heart failure: Secondary | ICD-10-CM | POA: Diagnosis not present

## 2017-01-20 DIAGNOSIS — L97819 Non-pressure chronic ulcer of other part of right lower leg with unspecified severity: Secondary | ICD-10-CM | POA: Diagnosis not present

## 2017-01-20 DIAGNOSIS — I5020 Unspecified systolic (congestive) heart failure: Secondary | ICD-10-CM | POA: Diagnosis not present

## 2017-01-20 DIAGNOSIS — L03116 Cellulitis of left lower limb: Secondary | ICD-10-CM | POA: Diagnosis not present

## 2017-01-20 DIAGNOSIS — J449 Chronic obstructive pulmonary disease, unspecified: Secondary | ICD-10-CM | POA: Diagnosis not present

## 2017-01-20 DIAGNOSIS — Z9981 Dependence on supplemental oxygen: Secondary | ICD-10-CM | POA: Diagnosis not present

## 2017-01-20 DIAGNOSIS — Z602 Problems related to living alone: Secondary | ICD-10-CM | POA: Diagnosis not present

## 2017-01-20 DIAGNOSIS — Z87891 Personal history of nicotine dependence: Secondary | ICD-10-CM | POA: Diagnosis not present

## 2017-01-23 DIAGNOSIS — L03115 Cellulitis of right lower limb: Secondary | ICD-10-CM | POA: Diagnosis not present

## 2017-01-23 DIAGNOSIS — I5020 Unspecified systolic (congestive) heart failure: Secondary | ICD-10-CM | POA: Diagnosis not present

## 2017-01-23 DIAGNOSIS — Z602 Problems related to living alone: Secondary | ICD-10-CM | POA: Diagnosis not present

## 2017-01-23 DIAGNOSIS — L97819 Non-pressure chronic ulcer of other part of right lower leg with unspecified severity: Secondary | ICD-10-CM | POA: Diagnosis not present

## 2017-01-23 DIAGNOSIS — I5030 Unspecified diastolic (congestive) heart failure: Secondary | ICD-10-CM | POA: Diagnosis not present

## 2017-01-23 DIAGNOSIS — Z87891 Personal history of nicotine dependence: Secondary | ICD-10-CM | POA: Diagnosis not present

## 2017-01-23 DIAGNOSIS — L97919 Non-pressure chronic ulcer of unspecified part of right lower leg with unspecified severity: Secondary | ICD-10-CM | POA: Diagnosis not present

## 2017-01-23 DIAGNOSIS — Z9981 Dependence on supplemental oxygen: Secondary | ICD-10-CM | POA: Diagnosis not present

## 2017-01-23 DIAGNOSIS — J449 Chronic obstructive pulmonary disease, unspecified: Secondary | ICD-10-CM | POA: Diagnosis not present

## 2017-01-23 DIAGNOSIS — L03116 Cellulitis of left lower limb: Secondary | ICD-10-CM | POA: Diagnosis not present

## 2017-01-23 DIAGNOSIS — I87393 Chronic venous hypertension (idiopathic) with other complications of bilateral lower extremity: Secondary | ICD-10-CM | POA: Diagnosis not present

## 2017-01-23 DIAGNOSIS — I110 Hypertensive heart disease with heart failure: Secondary | ICD-10-CM | POA: Diagnosis not present

## 2017-01-27 DIAGNOSIS — L97919 Non-pressure chronic ulcer of unspecified part of right lower leg with unspecified severity: Secondary | ICD-10-CM | POA: Diagnosis not present

## 2017-01-27 DIAGNOSIS — L97819 Non-pressure chronic ulcer of other part of right lower leg with unspecified severity: Secondary | ICD-10-CM | POA: Diagnosis not present

## 2017-01-27 DIAGNOSIS — Z87891 Personal history of nicotine dependence: Secondary | ICD-10-CM | POA: Diagnosis not present

## 2017-01-27 DIAGNOSIS — Z9981 Dependence on supplemental oxygen: Secondary | ICD-10-CM | POA: Diagnosis not present

## 2017-01-27 DIAGNOSIS — I110 Hypertensive heart disease with heart failure: Secondary | ICD-10-CM | POA: Diagnosis not present

## 2017-01-27 DIAGNOSIS — L03116 Cellulitis of left lower limb: Secondary | ICD-10-CM | POA: Diagnosis not present

## 2017-01-27 DIAGNOSIS — I87393 Chronic venous hypertension (idiopathic) with other complications of bilateral lower extremity: Secondary | ICD-10-CM | POA: Diagnosis not present

## 2017-01-27 DIAGNOSIS — L03115 Cellulitis of right lower limb: Secondary | ICD-10-CM | POA: Diagnosis not present

## 2017-01-27 DIAGNOSIS — I5020 Unspecified systolic (congestive) heart failure: Secondary | ICD-10-CM | POA: Diagnosis not present

## 2017-01-27 DIAGNOSIS — I5030 Unspecified diastolic (congestive) heart failure: Secondary | ICD-10-CM | POA: Diagnosis not present

## 2017-01-27 DIAGNOSIS — J449 Chronic obstructive pulmonary disease, unspecified: Secondary | ICD-10-CM | POA: Diagnosis not present

## 2017-01-27 DIAGNOSIS — Z602 Problems related to living alone: Secondary | ICD-10-CM | POA: Diagnosis not present
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
## 2017-01-28 ENCOUNTER — Other Ambulatory Visit: Payer: Self-pay | Admitting: Internal Medicine

## 2017-01-28 ENCOUNTER — Encounter: Payer: Self-pay | Admitting: Physician Assistant

## 2017-01-28 ENCOUNTER — Encounter (INDEPENDENT_AMBULATORY_CARE_PROVIDER_SITE_OTHER): Payer: Self-pay

## 2017-01-28 ENCOUNTER — Ambulatory Visit (INDEPENDENT_AMBULATORY_CARE_PROVIDER_SITE_OTHER): Payer: Medicare Other | Admitting: Physician Assistant

## 2017-01-28 ENCOUNTER — Encounter: Payer: Self-pay | Admitting: Vascular Surgery

## 2017-01-28 VITALS — BP 110/50 | HR 63 | Ht 64.0 in | Wt 289.4 lb

## 2017-01-28 DIAGNOSIS — I5033 Acute on chronic diastolic (congestive) heart failure: Secondary | ICD-10-CM | POA: Diagnosis not present

## 2017-01-28 DIAGNOSIS — Z8639 Personal history of other endocrine, nutritional and metabolic disease: Secondary | ICD-10-CM

## 2017-01-28 DIAGNOSIS — I471 Supraventricular tachycardia: Secondary | ICD-10-CM

## 2017-01-28 DIAGNOSIS — E039 Hypothyroidism, unspecified: Secondary | ICD-10-CM

## 2017-01-28 LAB — BASIC METABOLIC PANEL
BUN/Creatinine Ratio: 18 (ref 12–28)
BUN: 16 mg/dL (ref 8–27)
CALCIUM: 9.4 mg/dL (ref 8.7–10.3)
CO2: 34 mmol/L — AB (ref 20–29)
Chloride: 95 mmol/L — ABNORMAL LOW (ref 96–106)
Creatinine, Ser: 0.88 mg/dL (ref 0.57–1.00)
GFR calc Af Amer: 76 mL/min/{1.73_m2} (ref >59)
GFR, EST NON AFRICAN AMERICAN: 66 mL/min/{1.73_m2} (ref >59)
Glucose: 87 mg/dL (ref 65–99)
POTASSIUM: 4.5 mmol/L (ref 3.5–5.2)
Sodium: 143 mmol/L (ref 134–144)

## 2017-01-28 LAB — T4, FREE: FREE T4: 1.32 ng/dL (ref 0.82–1.77)

## 2017-01-28 LAB — TSH: TSH: 0.926 u[IU]/mL (ref 0.450–4.500)

## 2017-01-28 LAB — PRO B NATRIURETIC PEPTIDE: NT-PRO BNP: 794 pg/mL — AB (ref 0–301)

## 2017-01-28 MED ORDER — METOPROLOL TARTRATE 50 MG PO TABS: 25.0000 mg | ORAL_TABLET | Freq: Two times a day (BID) | ORAL | 3 refills | Status: DC

## 2017-01-28 MED ORDER — METOLAZONE 2.5 MG PO TABS: ORAL_TABLET | ORAL | 11 refills | Status: DC

## 2017-01-28 MED ORDER — TORSEMIDE 20 MG PO TABS: 40.0000 mg | ORAL_TABLET | Freq: Two times a day (BID) | ORAL | 3 refills | Status: DC

## 2017-01-28 NOTE — Patient Instructions (Addendum)
Medication Instructions: Your physician has recommended you make the following change in your medication:  -1) STOP Furosemide (Lasix) -2) START Torsemide (Demadex) - Take 2 tablets (40 mg) by mouth twice daily -3) DECREASE Lopressor - Take 0.5 tablet (25 mg) by mouth twice daily -4) Metolazone - Take 1 tablet by mouth once weekly. You may day an additional tablet on an additional day if needed for swelling or edema  Labwork: Your physician recommends that you have lab work today : BMET, TSH, BNP, and Free T4   Procedures/Testing: None Ordered   Follow-Up: Your physician recommends that you schedule a follow-up appointment in 3 weeks with Jamie Montoya, Jamie Montoya, or Jamie Montoya    Any Additional Special Instructions Will Be Listed Below (If Applicable).     If you need a refill on your cardiac medications before your next appointment, please call your pharmacy.  2.

## 2017-01-30 DIAGNOSIS — L03115 Cellulitis of right lower limb: Secondary | ICD-10-CM | POA: Diagnosis not present

## 2017-01-30 DIAGNOSIS — Z9981 Dependence on supplemental oxygen: Secondary | ICD-10-CM | POA: Diagnosis not present

## 2017-01-30 DIAGNOSIS — I5030 Unspecified diastolic (congestive) heart failure: Secondary | ICD-10-CM | POA: Diagnosis not present

## 2017-01-30 DIAGNOSIS — L97819 Non-pressure chronic ulcer of other part of right lower leg with unspecified severity: Secondary | ICD-10-CM | POA: Diagnosis not present

## 2017-01-30 DIAGNOSIS — Z602 Problems related to living alone: Secondary | ICD-10-CM | POA: Diagnosis not present

## 2017-01-30 DIAGNOSIS — L97919 Non-pressure chronic ulcer of unspecified part of right lower leg with unspecified severity: Secondary | ICD-10-CM | POA: Diagnosis not present

## 2017-01-30 DIAGNOSIS — I110 Hypertensive heart disease with heart failure: Secondary | ICD-10-CM | POA: Diagnosis not present

## 2017-01-30 DIAGNOSIS — Z87891 Personal history of nicotine dependence: Secondary | ICD-10-CM | POA: Diagnosis not present

## 2017-01-30 DIAGNOSIS — J449 Chronic obstructive pulmonary disease, unspecified: Secondary | ICD-10-CM | POA: Diagnosis not present

## 2017-01-30 DIAGNOSIS — L03116 Cellulitis of left lower limb: Secondary | ICD-10-CM | POA: Diagnosis not present

## 2017-01-30 DIAGNOSIS — I87393 Chronic venous hypertension (idiopathic) with other complications of bilateral lower extremity: Secondary | ICD-10-CM | POA: Diagnosis not present

## 2017-01-30 DIAGNOSIS — I5020 Unspecified systolic (congestive) heart failure: Secondary | ICD-10-CM | POA: Diagnosis not present

## 2017-02-03 DIAGNOSIS — L03116 Cellulitis of left lower limb: Secondary | ICD-10-CM | POA: Diagnosis not present

## 2017-02-03 DIAGNOSIS — I5030 Unspecified diastolic (congestive) heart failure: Secondary | ICD-10-CM | POA: Diagnosis not present

## 2017-02-03 DIAGNOSIS — Z87891 Personal history of nicotine dependence: Secondary | ICD-10-CM | POA: Diagnosis not present

## 2017-02-03 DIAGNOSIS — Z602 Problems related to living alone: Secondary | ICD-10-CM | POA: Diagnosis not present

## 2017-02-03 DIAGNOSIS — I110 Hypertensive heart disease with heart failure: Secondary | ICD-10-CM | POA: Diagnosis not present

## 2017-02-03 DIAGNOSIS — L97919 Non-pressure chronic ulcer of unspecified part of right lower leg with unspecified severity: Secondary | ICD-10-CM | POA: Diagnosis not present

## 2017-02-03 DIAGNOSIS — L97819 Non-pressure chronic ulcer of other part of right lower leg with unspecified severity: Secondary | ICD-10-CM | POA: Diagnosis not present

## 2017-02-03 DIAGNOSIS — I87393 Chronic venous hypertension (idiopathic) with other complications of bilateral lower extremity: Secondary | ICD-10-CM | POA: Diagnosis not present

## 2017-02-03 DIAGNOSIS — L03115 Cellulitis of right lower limb: Secondary | ICD-10-CM | POA: Diagnosis not present

## 2017-02-03 DIAGNOSIS — J449 Chronic obstructive pulmonary disease, unspecified: Secondary | ICD-10-CM | POA: Diagnosis not present

## 2017-02-03 DIAGNOSIS — I5020 Unspecified systolic (congestive) heart failure: Secondary | ICD-10-CM | POA: Diagnosis not present

## 2017-02-03 DIAGNOSIS — Z9981 Dependence on supplemental oxygen: Secondary | ICD-10-CM | POA: Diagnosis not present

## 2017-02-05 ENCOUNTER — Other Ambulatory Visit: Payer: Self-pay | Admitting: Cardiology

## 2017-02-06 ENCOUNTER — Encounter: Payer: Self-pay | Admitting: Vascular Surgery

## 2017-02-06 ENCOUNTER — Ambulatory Visit (INDEPENDENT_AMBULATORY_CARE_PROVIDER_SITE_OTHER): Payer: Medicare Other | Admitting: Vascular Surgery

## 2017-02-06 ENCOUNTER — Ambulatory Visit (HOSPITAL_COMMUNITY)
Admission: RE | Admit: 2017-02-06 | Discharge: 2017-02-06 | Disposition: A | Discharge status: Home / Self Care | Payer: Medicare Other | Source: Ambulatory Visit | Attending: Vascular Surgery | Admitting: Vascular Surgery

## 2017-02-06 VITALS — BP 95/48 | HR 62 | Temp 97.2°F | Resp 18 | Ht 64.0 in | Wt 286.0 lb

## 2017-02-06 DIAGNOSIS — R600 Localized edema: Secondary | ICD-10-CM | POA: Diagnosis not present

## 2017-02-06 DIAGNOSIS — M7989 Other specified soft tissue disorders: Secondary | ICD-10-CM
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
## 2017-02-10 DIAGNOSIS — I87393 Chronic venous hypertension (idiopathic) with other complications of bilateral lower extremity: Secondary | ICD-10-CM | POA: Diagnosis not present

## 2017-02-10 DIAGNOSIS — L97819 Non-pressure chronic ulcer of other part of right lower leg with unspecified severity: Secondary | ICD-10-CM | POA: Diagnosis not present

## 2017-02-10 DIAGNOSIS — Z87891 Personal history of nicotine dependence: Secondary | ICD-10-CM | POA: Diagnosis not present

## 2017-02-10 DIAGNOSIS — I5030 Unspecified diastolic (congestive) heart failure: Secondary | ICD-10-CM | POA: Diagnosis not present

## 2017-02-10 DIAGNOSIS — L97919 Non-pressure chronic ulcer of unspecified part of right lower leg with unspecified severity: Secondary | ICD-10-CM | POA: Diagnosis not present

## 2017-02-10 DIAGNOSIS — L03115 Cellulitis of right lower limb: Secondary | ICD-10-CM | POA: Diagnosis not present

## 2017-02-10 DIAGNOSIS — Z602 Problems related to living alone: Secondary | ICD-10-CM | POA: Diagnosis not present

## 2017-02-10 DIAGNOSIS — I110 Hypertensive heart disease with heart failure: Secondary | ICD-10-CM | POA: Diagnosis not present

## 2017-02-10 DIAGNOSIS — Z9981 Dependence on supplemental oxygen: Secondary | ICD-10-CM | POA: Diagnosis not present

## 2017-02-10 DIAGNOSIS — I5020 Unspecified systolic (congestive) heart failure: Secondary | ICD-10-CM | POA: Diagnosis not present

## 2017-02-10 DIAGNOSIS — L03116 Cellulitis of left lower limb: Secondary | ICD-10-CM | POA: Diagnosis not present

## 2017-02-10 DIAGNOSIS — J449 Chronic obstructive pulmonary disease, unspecified: Secondary | ICD-10-CM | POA: Diagnosis not present

## 2017-02-13 DIAGNOSIS — L97819 Non-pressure chronic ulcer of other part of right lower leg with unspecified severity: Secondary | ICD-10-CM | POA: Diagnosis not present

## 2017-02-13 DIAGNOSIS — Z9981 Dependence on supplemental oxygen: Secondary | ICD-10-CM | POA: Diagnosis not present

## 2017-02-13 DIAGNOSIS — I5030 Unspecified diastolic (congestive) heart failure: Secondary | ICD-10-CM | POA: Diagnosis not present

## 2017-02-13 DIAGNOSIS — Z87891 Personal history of nicotine dependence: Secondary | ICD-10-CM | POA: Diagnosis not present

## 2017-02-13 DIAGNOSIS — L97919 Non-pressure chronic ulcer of unspecified part of right lower leg with unspecified severity: Secondary | ICD-10-CM | POA: Diagnosis not present

## 2017-02-13 DIAGNOSIS — I110 Hypertensive heart disease with heart failure: Secondary | ICD-10-CM | POA: Diagnosis not present

## 2017-02-13 DIAGNOSIS — I5020 Unspecified systolic (congestive) heart failure: Secondary | ICD-10-CM | POA: Diagnosis not present

## 2017-02-13 DIAGNOSIS — L03115 Cellulitis of right lower limb: Secondary | ICD-10-CM | POA: Diagnosis not present

## 2017-02-13 DIAGNOSIS — Z602 Problems related to living alone: Secondary | ICD-10-CM | POA: Diagnosis not present

## 2017-02-13 DIAGNOSIS — J449 Chronic obstructive pulmonary disease, unspecified: Secondary | ICD-10-CM | POA: Diagnosis not present

## 2017-02-13 DIAGNOSIS — I87393 Chronic venous hypertension (idiopathic) with other complications of bilateral lower extremity: Secondary | ICD-10-CM | POA: Diagnosis not present

## 2017-02-13 DIAGNOSIS — L03116 Cellulitis of left lower limb: Secondary | ICD-10-CM | POA: Diagnosis not present

## 2017-02-20 ENCOUNTER — Encounter: Payer: Self-pay | Admitting: Physician Assistant

## 2017-02-20 ENCOUNTER — Ambulatory Visit (INDEPENDENT_AMBULATORY_CARE_PROVIDER_SITE_OTHER): Payer: Medicare Other | Admitting: Physician Assistant

## 2017-02-20 VITALS — BP 120/42 | HR 95 | Ht 64.0 in | Wt 289.8 lb

## 2017-02-20 DIAGNOSIS — L03115 Cellulitis of right lower limb: Secondary | ICD-10-CM | POA: Diagnosis not present

## 2017-02-20 DIAGNOSIS — I5032 Chronic diastolic (congestive) heart failure: Secondary | ICD-10-CM

## 2017-02-20 DIAGNOSIS — Z8639 Personal history of other endocrine, nutritional and metabolic disease: Secondary | ICD-10-CM | POA: Diagnosis not present

## 2017-02-20 DIAGNOSIS — I872 Venous insufficiency (chronic) (peripheral): Secondary | ICD-10-CM

## 2017-02-20 DIAGNOSIS — I471 Supraventricular tachycardia: Secondary | ICD-10-CM

## 2017-02-20 DIAGNOSIS — L03116 Cellulitis of left lower limb: Secondary | ICD-10-CM

## 2017-02-20 MED ORDER — SULFAMETHOXAZOLE-TRIMETHOPRIM 800-160 MG PO TABS: 1.0000 | ORAL_TABLET | Freq: Two times a day (BID) | ORAL | 0 refills | Status: DC

## 2017-02-20 MED ORDER — METOLAZONE 2.5 MG PO TABS: ORAL_TABLET | ORAL | 1 refills | Status: DC
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
## 2017-02-20 NOTE — Patient Instructions (Addendum)
Medication Instructions:  Your physician has recommended you make the following change in your medication:  1.  INCREASE the Metolazone to 2 times a week 2.  TAKE Bactrim DS 800-160 mg taking 1 tablet twice a day for 10 days  Labwork: TODAY:  CBC & BMET  Testing/Procedures: None ordered  Follow-Up: Your physician recommends that you schedule a follow-up appointment in: [place]    Any Other Special Instructions Will Be Listed Below (If Applicable).     If you need a refill on your cardiac medications before your next appointment, please call your pharmacy.

## 2017-02-21 DIAGNOSIS — I87393 Chronic venous hypertension (idiopathic) with other complications of bilateral lower extremity: Secondary | ICD-10-CM | POA: Diagnosis not present

## 2017-02-21 DIAGNOSIS — L03116 Cellulitis of left lower limb: Secondary | ICD-10-CM | POA: Diagnosis not present

## 2017-02-21 DIAGNOSIS — Z87891 Personal history of nicotine dependence: Secondary | ICD-10-CM | POA: Diagnosis not present

## 2017-02-21 DIAGNOSIS — Z602 Problems related to living alone: Secondary | ICD-10-CM | POA: Diagnosis not present

## 2017-02-21 DIAGNOSIS — L03115 Cellulitis of right lower limb: Secondary | ICD-10-CM | POA: Diagnosis not present

## 2017-02-21 DIAGNOSIS — I5030 Unspecified diastolic (congestive) heart failure: Secondary | ICD-10-CM | POA: Diagnosis not present

## 2017-02-21 DIAGNOSIS — I110 Hypertensive heart disease with heart failure: Secondary | ICD-10-CM | POA: Diagnosis not present

## 2017-02-21 DIAGNOSIS — Z9981 Dependence on supplemental oxygen: Secondary | ICD-10-CM | POA: Diagnosis not present

## 2017-02-21 DIAGNOSIS — L97819 Non-pressure chronic ulcer of other part of right lower leg with unspecified severity: Secondary | ICD-10-CM | POA: Diagnosis not present

## 2017-02-21 DIAGNOSIS — J449 Chronic obstructive pulmonary disease, unspecified: Secondary | ICD-10-CM | POA: Diagnosis not present

## 2017-02-21 DIAGNOSIS — I5020 Unspecified systolic (congestive) heart failure: Secondary | ICD-10-CM | POA: Diagnosis not present

## 2017-02-21 DIAGNOSIS — L97919 Non-pressure chronic ulcer of unspecified part of right lower leg with unspecified severity: Secondary | ICD-10-CM | POA: Diagnosis not present

## 2017-02-21 LAB — CBC
Hematocrit: 33 % — ABNORMAL LOW (ref 34.0–46.6)
Hemoglobin: 9.9 g/dL — ABNORMAL LOW (ref 11.1–15.9)
MCH: 28.9 pg (ref 26.6–33.0)
MCHC: 30 g/dL — AB (ref 31.5–35.7)
MCV: 96 fL (ref 79–97)
PLATELETS: 217 10*3/uL (ref 150–379)
RBC: 3.43 x10E6/uL — AB (ref 3.77–5.28)
RDW: 15.3 % (ref 12.3–15.4)
WBC: 6 10*3/uL (ref 3.4–10.8)

## 2017-02-21 LAB — BASIC METABOLIC PANEL
BUN / CREAT RATIO: 22 (ref 12–28)
BUN: 22 mg/dL (ref 8–27)
CO2: 32 mmol/L — ABNORMAL HIGH (ref 20–29)
CREATININE: 0.99 mg/dL (ref 0.57–1.00)
Calcium: 9.7 mg/dL (ref 8.7–10.3)
Chloride: 95 mmol/L — ABNORMAL LOW (ref 96–106)
GFR, EST AFRICAN AMERICAN: 66 mL/min/{1.73_m2} (ref >59)
GFR, EST NON AFRICAN AMERICAN: 57 mL/min/{1.73_m2} — AB (ref >59)
Glucose: 82 mg/dL (ref 65–99)
POTASSIUM: 4.5 mmol/L (ref 3.5–5.2)
SODIUM: 139 mmol/L (ref 134–144)

## 2017-02-23 DIAGNOSIS — I5030 Unspecified diastolic (congestive) heart failure: Secondary | ICD-10-CM | POA: Diagnosis not present

## 2017-02-27 DIAGNOSIS — L97819 Non-pressure chronic ulcer of other part of right lower leg with unspecified severity: Secondary | ICD-10-CM | POA: Diagnosis not present

## 2017-02-27 DIAGNOSIS — I87393 Chronic venous hypertension (idiopathic) with other complications of bilateral lower extremity: Secondary | ICD-10-CM | POA: Diagnosis not present

## 2017-02-27 DIAGNOSIS — Z602 Problems related to living alone: Secondary | ICD-10-CM | POA: Diagnosis not present

## 2017-02-27 DIAGNOSIS — L97919 Non-pressure chronic ulcer of unspecified part of right lower leg with unspecified severity: Secondary | ICD-10-CM | POA: Diagnosis not present

## 2017-02-27 DIAGNOSIS — I5020 Unspecified systolic (congestive) heart failure: Secondary | ICD-10-CM | POA: Diagnosis not present

## 2017-02-27 DIAGNOSIS — L03116 Cellulitis of left lower limb: Secondary | ICD-10-CM | POA: Diagnosis not present

## 2017-02-27 DIAGNOSIS — I110 Hypertensive heart disease with heart failure: Secondary | ICD-10-CM | POA: Diagnosis not present

## 2017-02-27 DIAGNOSIS — L03115 Cellulitis of right lower limb: Secondary | ICD-10-CM | POA: Diagnosis not present

## 2017-02-27 DIAGNOSIS — J449 Chronic obstructive pulmonary disease, unspecified: Secondary | ICD-10-CM | POA: Diagnosis not present

## 2017-02-27 DIAGNOSIS — Z9981 Dependence on supplemental oxygen: Secondary | ICD-10-CM | POA: Diagnosis not present

## 2017-02-27 DIAGNOSIS — Z87891 Personal history of nicotine dependence: Secondary | ICD-10-CM | POA: Diagnosis not present

## 2017-02-27 DIAGNOSIS — I5030 Unspecified diastolic (congestive) heart failure: Secondary | ICD-10-CM | POA: Diagnosis not present

## 2017-03-04 ENCOUNTER — Telehealth: Payer: Self-pay

## 2017-03-04 NOTE — Telephone Encounter (Signed)
Patient would like a call back regarding a antibiotic (bactrim) sent in at her last OV with Dayna Dunn. She took a ten day supply she got from her local pharmacy and received another larger supply from her mail order pharmacy. She would like a call back [phone] to discuss.

## 2017-03-04 NOTE — Telephone Encounter (Signed)
Returned pts call and she wanted to make sure that she was only supposed to take the Bactrim 10 days.  She stated that her mail pharmacy sent her another supply.  Pt was advised to only take it for 10 days, as Melina Copa, PA-C had advised.  Pt thanked me for my call.

## 2017-03-06 DIAGNOSIS — I87393 Chronic venous hypertension (idiopathic) with other complications of bilateral lower extremity: Secondary | ICD-10-CM | POA: Diagnosis not present

## 2017-03-06 DIAGNOSIS — I5030 Unspecified diastolic (congestive) heart failure: Secondary | ICD-10-CM | POA: Diagnosis not present

## 2017-03-06 DIAGNOSIS — Z87891 Personal history of nicotine dependence: Secondary | ICD-10-CM | POA: Diagnosis not present

## 2017-03-06 DIAGNOSIS — J449 Chronic obstructive pulmonary disease, unspecified: Secondary | ICD-10-CM | POA: Diagnosis not present

## 2017-03-06 DIAGNOSIS — L97919 Non-pressure chronic ulcer of unspecified part of right lower leg with unspecified severity: Secondary | ICD-10-CM | POA: Diagnosis not present

## 2017-03-06 DIAGNOSIS — I5020 Unspecified systolic (congestive) heart failure: Secondary | ICD-10-CM | POA: Diagnosis not present

## 2017-03-06 DIAGNOSIS — L03115 Cellulitis of right lower limb: Secondary | ICD-10-CM | POA: Diagnosis not present

## 2017-03-06 DIAGNOSIS — Z9981 Dependence on supplemental oxygen: Secondary | ICD-10-CM | POA: Diagnosis not present

## 2017-03-06 DIAGNOSIS — I110 Hypertensive heart disease with heart failure: Secondary | ICD-10-CM | POA: Diagnosis not present

## 2017-03-06 DIAGNOSIS — L03116 Cellulitis of left lower limb: Secondary | ICD-10-CM | POA: Diagnosis not present

## 2017-03-06 DIAGNOSIS — L97819 Non-pressure chronic ulcer of other part of right lower leg with unspecified severity: Secondary | ICD-10-CM | POA: Diagnosis not present

## 2017-03-06 DIAGNOSIS — Z602 Problems related to living alone: Secondary | ICD-10-CM | POA: Diagnosis not present

## 2017-03-10 ENCOUNTER — Other Ambulatory Visit: Payer: Self-pay

## 2017-03-10 MED ORDER — METOLAZONE 2.5 MG PO TABS: ORAL_TABLET | ORAL | 1 refills | Status: DC

## 2017-03-11 DIAGNOSIS — B351 Tinea unguium: Secondary | ICD-10-CM | POA: Diagnosis not present

## 2017-03-11 DIAGNOSIS — I739 Peripheral vascular disease, unspecified: Secondary | ICD-10-CM | POA: Diagnosis not present

## 2017-03-11 DIAGNOSIS — L84 Corns and callosities: Secondary | ICD-10-CM | POA: Diagnosis not present

## 2017-03-11 DIAGNOSIS — M2042 Other hammer toe(s) (acquired), left foot: Secondary | ICD-10-CM | POA: Diagnosis not present

## 2017-03-13 DIAGNOSIS — I5020 Unspecified systolic (congestive) heart failure: Secondary | ICD-10-CM | POA: Diagnosis not present

## 2017-03-13 DIAGNOSIS — I87393 Chronic venous hypertension (idiopathic) with other complications of bilateral lower extremity: Secondary | ICD-10-CM | POA: Diagnosis not present

## 2017-03-13 DIAGNOSIS — L97919 Non-pressure chronic ulcer of unspecified part of right lower leg with unspecified severity: Secondary | ICD-10-CM | POA: Diagnosis not present

## 2017-03-13 DIAGNOSIS — Z87891 Personal history of nicotine dependence: Secondary | ICD-10-CM | POA: Diagnosis not present

## 2017-03-13 DIAGNOSIS — I110 Hypertensive heart disease with heart failure: Secondary | ICD-10-CM | POA: Diagnosis not present

## 2017-03-13 DIAGNOSIS — L03115 Cellulitis of right lower limb: Secondary | ICD-10-CM | POA: Diagnosis not present

## 2017-03-13 DIAGNOSIS — L03116 Cellulitis of left lower limb: Secondary | ICD-10-CM | POA: Diagnosis not present

## 2017-03-13 DIAGNOSIS — J449 Chronic obstructive pulmonary disease, unspecified: Secondary | ICD-10-CM | POA: Diagnosis not present

## 2017-03-13 DIAGNOSIS — L97819 Non-pressure chronic ulcer of other part of right lower leg with unspecified severity: Secondary | ICD-10-CM | POA: Diagnosis not present

## 2017-03-13 DIAGNOSIS — Z602 Problems related to living alone: Secondary | ICD-10-CM | POA: Diagnosis not present

## 2017-03-13 DIAGNOSIS — I5030 Unspecified diastolic (congestive) heart failure: Secondary | ICD-10-CM | POA: Diagnosis not present

## 2017-03-13 DIAGNOSIS — Z9981 Dependence on supplemental oxygen: Secondary | ICD-10-CM | POA: Diagnosis not present

## 2017-03-20 DIAGNOSIS — Z602 Problems related to living alone: Secondary | ICD-10-CM | POA: Diagnosis not present

## 2017-03-20 DIAGNOSIS — J449 Chronic obstructive pulmonary disease, unspecified: Secondary | ICD-10-CM | POA: Diagnosis not present

## 2017-03-20 DIAGNOSIS — I5030 Unspecified diastolic (congestive) heart failure: Secondary | ICD-10-CM | POA: Diagnosis not present

## 2017-03-20 DIAGNOSIS — Z9981 Dependence on supplemental oxygen: Secondary | ICD-10-CM | POA: Diagnosis not present

## 2017-03-20 DIAGNOSIS — L03116 Cellulitis of left lower limb: Secondary | ICD-10-CM | POA: Diagnosis not present

## 2017-03-20 DIAGNOSIS — Z87891 Personal history of nicotine dependence: Secondary | ICD-10-CM | POA: Diagnosis not present

## 2017-03-20 DIAGNOSIS — L03115 Cellulitis of right lower limb: Secondary | ICD-10-CM | POA: Diagnosis not present

## 2017-03-20 DIAGNOSIS — L97919 Non-pressure chronic ulcer of unspecified part of right lower leg with unspecified severity: Secondary | ICD-10-CM | POA: Diagnosis not present

## 2017-03-20 DIAGNOSIS — I87393 Chronic venous hypertension (idiopathic) with other complications of bilateral lower extremity: Secondary | ICD-10-CM | POA: Diagnosis not present

## 2017-03-20 DIAGNOSIS — I110 Hypertensive heart disease with heart failure: Secondary | ICD-10-CM | POA: Diagnosis not present

## 2017-03-20 DIAGNOSIS — L97819 Non-pressure chronic ulcer of other part of right lower leg with unspecified severity: Secondary | ICD-10-CM | POA: Diagnosis not present

## 2017-03-20 DIAGNOSIS — I5020 Unspecified systolic (congestive) heart failure: Secondary | ICD-10-CM | POA: Diagnosis not present

## 2017-03-25 DIAGNOSIS — I5030 Unspecified diastolic (congestive) heart failure: Secondary | ICD-10-CM | POA: Diagnosis not present

## 2017-03-27 DIAGNOSIS — L03115 Cellulitis of right lower limb: Secondary | ICD-10-CM | POA: Diagnosis not present

## 2017-03-27 DIAGNOSIS — I5020 Unspecified systolic (congestive) heart failure: Secondary | ICD-10-CM | POA: Diagnosis not present

## 2017-03-27 DIAGNOSIS — Z87891 Personal history of nicotine dependence: Secondary | ICD-10-CM | POA: Diagnosis not present

## 2017-03-27 DIAGNOSIS — I110 Hypertensive heart disease with heart failure: Secondary | ICD-10-CM | POA: Diagnosis not present

## 2017-03-27 DIAGNOSIS — I87393 Chronic venous hypertension (idiopathic) with other complications of bilateral lower extremity: Secondary | ICD-10-CM | POA: Diagnosis not present

## 2017-03-27 DIAGNOSIS — J449 Chronic obstructive pulmonary disease, unspecified: Secondary | ICD-10-CM | POA: Diagnosis not present

## 2017-03-27 DIAGNOSIS — Z9981 Dependence on supplemental oxygen: Secondary | ICD-10-CM | POA: Diagnosis not present

## 2017-03-27 DIAGNOSIS — I5030 Unspecified diastolic (congestive) heart failure: Secondary | ICD-10-CM | POA: Diagnosis not present

## 2017-03-27 DIAGNOSIS — L03116 Cellulitis of left lower limb: Secondary | ICD-10-CM | POA: Diagnosis not present

## 2017-03-27 DIAGNOSIS — L97819 Non-pressure chronic ulcer of other part of right lower leg with unspecified severity: Secondary | ICD-10-CM | POA: Diagnosis not present

## 2017-03-27 DIAGNOSIS — L97919 Non-pressure chronic ulcer of unspecified part of right lower leg with unspecified severity: Secondary | ICD-10-CM | POA: Diagnosis not present

## 2017-03-27 DIAGNOSIS — Z602 Problems related to living alone: Secondary | ICD-10-CM | POA: Diagnosis not present

## 2017-04-03 DIAGNOSIS — L03115 Cellulitis of right lower limb: Secondary | ICD-10-CM | POA: Diagnosis not present

## 2017-04-03 DIAGNOSIS — I110 Hypertensive heart disease with heart failure: Secondary | ICD-10-CM | POA: Diagnosis not present

## 2017-04-03 DIAGNOSIS — Z87891 Personal history of nicotine dependence: Secondary | ICD-10-CM | POA: Diagnosis not present

## 2017-04-03 DIAGNOSIS — I5030 Unspecified diastolic (congestive) heart failure: Secondary | ICD-10-CM | POA: Diagnosis not present

## 2017-04-03 DIAGNOSIS — Z9981 Dependence on supplemental oxygen: Secondary | ICD-10-CM | POA: Diagnosis not present

## 2017-04-03 DIAGNOSIS — L97919 Non-pressure chronic ulcer of unspecified part of right lower leg with unspecified severity: Secondary | ICD-10-CM | POA: Diagnosis not present

## 2017-04-03 DIAGNOSIS — I87393 Chronic venous hypertension (idiopathic) with other complications of bilateral lower extremity: Secondary | ICD-10-CM | POA: Diagnosis not present

## 2017-04-03 DIAGNOSIS — I5020 Unspecified systolic (congestive) heart failure: Secondary | ICD-10-CM | POA: Diagnosis not present

## 2017-04-03 DIAGNOSIS — J449 Chronic obstructive pulmonary disease, unspecified: Secondary | ICD-10-CM | POA: Diagnosis not present

## 2017-04-03 DIAGNOSIS — L03116 Cellulitis of left lower limb: Secondary | ICD-10-CM | POA: Diagnosis not present

## 2017-04-03 DIAGNOSIS — L97819 Non-pressure chronic ulcer of other part of right lower leg with unspecified severity: Secondary | ICD-10-CM | POA: Diagnosis not present

## 2017-04-03 DIAGNOSIS — Z602 Problems related to living alone: Secondary | ICD-10-CM | POA: Diagnosis not present

## 2017-04-10 DIAGNOSIS — Z602 Problems related to living alone: Secondary | ICD-10-CM | POA: Diagnosis not present

## 2017-04-10 DIAGNOSIS — L97819 Non-pressure chronic ulcer of other part of right lower leg with unspecified severity: Secondary | ICD-10-CM | POA: Diagnosis not present

## 2017-04-10 DIAGNOSIS — I87393 Chronic venous hypertension (idiopathic) with other complications of bilateral lower extremity: Secondary | ICD-10-CM | POA: Diagnosis not present

## 2017-04-10 DIAGNOSIS — L03115 Cellulitis of right lower limb: Secondary | ICD-10-CM | POA: Diagnosis not present

## 2017-04-10 DIAGNOSIS — I5020 Unspecified systolic (congestive) heart failure: Secondary | ICD-10-CM | POA: Diagnosis not present

## 2017-04-10 DIAGNOSIS — J449 Chronic obstructive pulmonary disease, unspecified: Secondary | ICD-10-CM | POA: Diagnosis not present

## 2017-04-10 DIAGNOSIS — Z9981 Dependence on supplemental oxygen: Secondary | ICD-10-CM | POA: Diagnosis not present

## 2017-04-10 DIAGNOSIS — I110 Hypertensive heart disease with heart failure: Secondary | ICD-10-CM | POA: Diagnosis not present

## 2017-04-10 DIAGNOSIS — L97919 Non-pressure chronic ulcer of unspecified part of right lower leg with unspecified severity: Secondary | ICD-10-CM | POA: Diagnosis not present

## 2017-04-10 DIAGNOSIS — L03116 Cellulitis of left lower limb: Secondary | ICD-10-CM | POA: Diagnosis not present

## 2017-04-10 DIAGNOSIS — I5030 Unspecified diastolic (congestive) heart failure: Secondary | ICD-10-CM | POA: Diagnosis not present

## 2017-04-10 DIAGNOSIS — Z87891 Personal history of nicotine dependence: Secondary | ICD-10-CM | POA: Diagnosis not present

## 2017-04-18 DIAGNOSIS — I87393 Chronic venous hypertension (idiopathic) with other complications of bilateral lower extremity: Secondary | ICD-10-CM | POA: Diagnosis not present

## 2017-04-18 DIAGNOSIS — L97819 Non-pressure chronic ulcer of other part of right lower leg with unspecified severity: Secondary | ICD-10-CM | POA: Diagnosis not present

## 2017-04-18 DIAGNOSIS — I5040 Unspecified combined systolic (congestive) and diastolic (congestive) heart failure: Secondary | ICD-10-CM | POA: Diagnosis not present

## 2017-04-18 DIAGNOSIS — I890 Lymphedema, not elsewhere classified: Secondary | ICD-10-CM | POA: Diagnosis not present

## 2017-04-18 DIAGNOSIS — L97919 Non-pressure chronic ulcer of unspecified part of right lower leg with unspecified severity: Secondary | ICD-10-CM | POA: Diagnosis not present

## 2017-04-18 DIAGNOSIS — Z9981 Dependence on supplemental oxygen: Secondary | ICD-10-CM | POA: Diagnosis not present

## 2017-04-18 DIAGNOSIS — I110 Hypertensive heart disease with heart failure: Secondary | ICD-10-CM | POA: Diagnosis not present

## 2017-04-18 DIAGNOSIS — Z87891 Personal history of nicotine dependence: Secondary | ICD-10-CM | POA: Diagnosis not present

## 2017-04-18 DIAGNOSIS — Z602 Problems related to living alone: Secondary | ICD-10-CM | POA: Diagnosis not present

## 2017-04-18 DIAGNOSIS — J449 Chronic obstructive pulmonary disease, unspecified: Secondary | ICD-10-CM | POA: Diagnosis not present

## 2017-04-18 DIAGNOSIS — J9610 Chronic respiratory failure, unspecified whether with hypoxia or hypercapnia: Secondary | ICD-10-CM | POA: Diagnosis not present

## 2017-04-24 DIAGNOSIS — L97919 Non-pressure chronic ulcer of unspecified part of right lower leg with unspecified severity: Secondary | ICD-10-CM | POA: Diagnosis not present

## 2017-04-24 DIAGNOSIS — J449 Chronic obstructive pulmonary disease, unspecified: Secondary | ICD-10-CM | POA: Diagnosis not present

## 2017-04-24 DIAGNOSIS — I5040 Unspecified combined systolic (congestive) and diastolic (congestive) heart failure: Secondary | ICD-10-CM | POA: Diagnosis not present

## 2017-04-24 DIAGNOSIS — I87393 Chronic venous hypertension (idiopathic) with other complications of bilateral lower extremity: Secondary | ICD-10-CM | POA: Diagnosis not present

## 2017-04-24 DIAGNOSIS — J9610 Chronic respiratory failure, unspecified whether with hypoxia or hypercapnia: Secondary | ICD-10-CM | POA: Diagnosis not present

## 2017-04-24 DIAGNOSIS — I110 Hypertensive heart disease with heart failure: Secondary | ICD-10-CM | POA: Diagnosis not present

## 2017-04-24 DIAGNOSIS — I890 Lymphedema, not elsewhere classified: Secondary | ICD-10-CM | POA: Diagnosis not present

## 2017-04-24 DIAGNOSIS — Z87891 Personal history of nicotine dependence: Secondary | ICD-10-CM | POA: Diagnosis not present

## 2017-04-24 DIAGNOSIS — L97819 Non-pressure chronic ulcer of other part of right lower leg with unspecified severity: Secondary | ICD-10-CM | POA: Diagnosis not present

## 2017-04-24 DIAGNOSIS — Z602 Problems related to living alone: Secondary | ICD-10-CM | POA: Diagnosis not present

## 2017-04-24 DIAGNOSIS — Z9981 Dependence on supplemental oxygen: Secondary | ICD-10-CM | POA: Diagnosis not present

## 2017-04-25 ENCOUNTER — Telehealth: Payer: Self-pay | Admitting: Cardiology

## 2017-04-25 DIAGNOSIS — I5030 Unspecified diastolic (congestive) heart failure: Secondary | ICD-10-CM | POA: Diagnosis not present

## 2017-04-25 NOTE — Telephone Encounter (Signed)
Spoke with Juliann Pulse RN with the pt and informed her that Dr Meda Coffee would like for her to come in and see a PA.  Informed Juliann Pulse that Melina Copa PA-C, who last saw the pt, has an opening next Tuesday 11/6 at 2 pm.  Juliann Pulse RN endorsed this recommendation to the pt and she refuses to come into the office to be seen, for she states "I don't want to ride the [employer] bus all the way out there with my loose stools."  Pt states "I'm fine and they can fix me over the phone." Juliann Pulse RN has reiterated and reeducated the pt while I was on the phone, and pt was very argumentative and non-compliant with suggestions made by Juliann Pulse and myself.  Informed Juliann Pulse RN that pt should then just continue current regimen as is, and maintain a healthy diet.   Informed Juliann Pulse that I will inform Dr Meda Coffee of pts non-compliance, and follow back up with her, if further recommendations are provided. Juliann Pulse RN verbalized understanding and very gracious for all the assistance provided.

## 2017-04-25 NOTE — Telephone Encounter (Signed)
Home health RN with [facility] is calling to update Dr Meda Coffee on this pts recent weights.  Per Juliann Pulse RN pts weights have trended 8 lbs in 2 weeks.  As indicated in this message, Nelida Gores provided the pts dry weights, since 10/20. Per Juliann Pulse RN, she is unsure if pt is being compliant with her diet and Metolazone.  Juliann Pulse states that she consistently educates reiterates to the pt the importance of her sodium intake and taking her Metolazone twice weekly.  Per Juliann Pulse she states pt reports she takes both her torsemide and metolazone, but Juliann Pulse thinks she misses the metolazone.  Per Juliann Pulse, pt is in no distress at this time and and has no active complaints.  Per Juliann Pulse RN, her swelling is no more than usual in her lower extremities.  Juliann Pulse RN just wanted to make Dr Meda Coffee aware of this incase she wanted to make any adjustments, or continue current regimen and educate.  Informed Juliann Pulse that Dr Meda Coffee is out of the office today, but I will route this message to her for further review and recommendation, and follow-up shortly thereafter.  Juliann Pulse verbalized understanding and agrees with this plan.

## 2017-04-25 NOTE — Telephone Encounter (Signed)
New message     [facility] calling patient taken her prescribe medications all that's order.    Pt c/o swelling: STAT is pt has developed SOB within 24 hours  1) How much weight have you gained and in what time span? 8 lbs in 14 days 04/12/17 --295 &04/25/17 ---303  2) If swelling, where is the swelling located? Always - legs wrap x 2 weeks   3) Are you currently taking a fluid pill? Yes   metolazone (ZAROXOLYN) 2.5 MG tablet    torsemide (DEMADEX) 20 MG tablet  4) Are you currently SOB? No more what's normal   5) Do you have a log of your daily weights (if so, list)? 04/12/17 --295 &04/25/17 ---303  6) Have you gained 3 pounds in a day or 5 pounds in a week? 8 lbs in 14 days  7) Have you traveled recently? No

## 2017-04-25 NOTE — Telephone Encounter (Signed)
Please schedule an appointment with a PA.

## 2017-04-28 ENCOUNTER — Other Ambulatory Visit: Payer: Self-pay | Admitting: Internal Medicine

## 2017-05-01 DIAGNOSIS — I110 Hypertensive heart disease with heart failure: Secondary | ICD-10-CM | POA: Diagnosis not present

## 2017-05-01 DIAGNOSIS — I87393 Chronic venous hypertension (idiopathic) with other complications of bilateral lower extremity: Secondary | ICD-10-CM | POA: Diagnosis not present

## 2017-05-01 DIAGNOSIS — Z87891 Personal history of nicotine dependence: Secondary | ICD-10-CM | POA: Diagnosis not present

## 2017-05-01 DIAGNOSIS — Z9981 Dependence on supplemental oxygen: Secondary | ICD-10-CM | POA: Diagnosis not present

## 2017-05-01 DIAGNOSIS — L97919 Non-pressure chronic ulcer of unspecified part of right lower leg with unspecified severity: Secondary | ICD-10-CM | POA: Diagnosis not present

## 2017-05-01 DIAGNOSIS — I890 Lymphedema, not elsewhere classified: Secondary | ICD-10-CM | POA: Diagnosis not present

## 2017-05-01 DIAGNOSIS — J449 Chronic obstructive pulmonary disease, unspecified: Secondary | ICD-10-CM | POA: Diagnosis not present

## 2017-05-01 DIAGNOSIS — L97819 Non-pressure chronic ulcer of other part of right lower leg with unspecified severity: Secondary | ICD-10-CM | POA: Diagnosis not present

## 2017-05-01 DIAGNOSIS — J9610 Chronic respiratory failure, unspecified whether with hypoxia or hypercapnia: Secondary | ICD-10-CM | POA: Diagnosis not present

## 2017-05-01 DIAGNOSIS — I5040 Unspecified combined systolic (congestive) and diastolic (congestive) heart failure: Secondary | ICD-10-CM | POA: Diagnosis not present

## 2017-05-01 DIAGNOSIS — Z602 Problems related to living alone: Secondary | ICD-10-CM | POA: Diagnosis not present

## 2017-05-09 DIAGNOSIS — I87393 Chronic venous hypertension (idiopathic) with other complications of bilateral lower extremity: Secondary | ICD-10-CM | POA: Diagnosis not present

## 2017-05-09 DIAGNOSIS — L97819 Non-pressure chronic ulcer of other part of right lower leg with unspecified severity: Secondary | ICD-10-CM | POA: Diagnosis not present

## 2017-05-09 DIAGNOSIS — Z602 Problems related to living alone: Secondary | ICD-10-CM | POA: Diagnosis not present

## 2017-05-09 DIAGNOSIS — J9610 Chronic respiratory failure, unspecified whether with hypoxia or hypercapnia: Secondary | ICD-10-CM | POA: Diagnosis not present

## 2017-05-09 DIAGNOSIS — L97919 Non-pressure chronic ulcer of unspecified part of right lower leg with unspecified severity: Secondary | ICD-10-CM | POA: Diagnosis not present

## 2017-05-09 DIAGNOSIS — J449 Chronic obstructive pulmonary disease, unspecified: Secondary | ICD-10-CM | POA: Diagnosis not present

## 2017-05-09 DIAGNOSIS — I110 Hypertensive heart disease with heart failure: Secondary | ICD-10-CM | POA: Diagnosis not present

## 2017-05-09 DIAGNOSIS — I5040 Unspecified combined systolic (congestive) and diastolic (congestive) heart failure: Secondary | ICD-10-CM | POA: Diagnosis not present

## 2017-05-09 DIAGNOSIS — I890 Lymphedema, not elsewhere classified: Secondary | ICD-10-CM | POA: Diagnosis not present

## 2017-05-09 DIAGNOSIS — Z87891 Personal history of nicotine dependence: Secondary | ICD-10-CM | POA: Diagnosis not present

## 2017-05-09 DIAGNOSIS — Z9981 Dependence on supplemental oxygen: Secondary | ICD-10-CM | POA: Diagnosis not present

## 2017-05-12 ENCOUNTER — Ambulatory Visit: Payer: Medicare Other | Admitting: Cardiology

## 2017-05-14 DIAGNOSIS — J9610 Chronic respiratory failure, unspecified whether with hypoxia or hypercapnia: Secondary | ICD-10-CM | POA: Diagnosis not present

## 2017-05-14 DIAGNOSIS — I890 Lymphedema, not elsewhere classified: Secondary | ICD-10-CM | POA: Diagnosis not present

## 2017-05-14 DIAGNOSIS — Z9981 Dependence on supplemental oxygen: Secondary | ICD-10-CM | POA: Diagnosis not present

## 2017-05-14 DIAGNOSIS — L97819 Non-pressure chronic ulcer of other part of right lower leg with unspecified severity: Secondary | ICD-10-CM | POA: Diagnosis not present

## 2017-05-14 DIAGNOSIS — J449 Chronic obstructive pulmonary disease, unspecified: Secondary | ICD-10-CM | POA: Diagnosis not present

## 2017-05-14 DIAGNOSIS — Z602 Problems related to living alone: Secondary | ICD-10-CM | POA: Diagnosis not present

## 2017-05-14 DIAGNOSIS — I87393 Chronic venous hypertension (idiopathic) with other complications of bilateral lower extremity: Secondary | ICD-10-CM | POA: Diagnosis not present

## 2017-05-14 DIAGNOSIS — Z87891 Personal history of nicotine dependence: Secondary | ICD-10-CM | POA: Diagnosis not present

## 2017-05-14 DIAGNOSIS — I5040 Unspecified combined systolic (congestive) and diastolic (congestive) heart failure: Secondary | ICD-10-CM | POA: Diagnosis not present

## 2017-05-14 DIAGNOSIS — I110 Hypertensive heart disease with heart failure: Secondary | ICD-10-CM | POA: Diagnosis not present

## 2017-05-14 DIAGNOSIS — L97919 Non-pressure chronic ulcer of unspecified part of right lower leg with unspecified severity: Secondary | ICD-10-CM | POA: Diagnosis not present

## 2017-05-22 DIAGNOSIS — L97919 Non-pressure chronic ulcer of unspecified part of right lower leg with unspecified severity: Secondary | ICD-10-CM | POA: Diagnosis not present

## 2017-05-22 DIAGNOSIS — I87393 Chronic venous hypertension (idiopathic) with other complications of bilateral lower extremity: Secondary | ICD-10-CM | POA: Diagnosis not present

## 2017-05-22 DIAGNOSIS — I5040 Unspecified combined systolic (congestive) and diastolic (congestive) heart failure: Secondary | ICD-10-CM | POA: Diagnosis not present

## 2017-05-22 DIAGNOSIS — Z602 Problems related to living alone: Secondary | ICD-10-CM | POA: Diagnosis not present

## 2017-05-22 DIAGNOSIS — J449 Chronic obstructive pulmonary disease, unspecified: Secondary | ICD-10-CM | POA: Diagnosis not present

## 2017-05-22 DIAGNOSIS — J9610 Chronic respiratory failure, unspecified whether with hypoxia or hypercapnia: Secondary | ICD-10-CM | POA: Diagnosis not present

## 2017-05-22 DIAGNOSIS — L97819 Non-pressure chronic ulcer of other part of right lower leg with unspecified severity: Secondary | ICD-10-CM | POA: Diagnosis not present

## 2017-05-22 DIAGNOSIS — Z9981 Dependence on supplemental oxygen: Secondary | ICD-10-CM | POA: Diagnosis not present

## 2017-05-22 DIAGNOSIS — I110 Hypertensive heart disease with heart failure: Secondary | ICD-10-CM | POA: Diagnosis not present

## 2017-05-22 DIAGNOSIS — I890 Lymphedema, not elsewhere classified: Secondary | ICD-10-CM | POA: Diagnosis not present

## 2017-05-22 DIAGNOSIS — Z87891 Personal history of nicotine dependence: Secondary | ICD-10-CM | POA: Diagnosis not present

## 2017-05-23 ENCOUNTER — Other Ambulatory Visit: Payer: Self-pay | Admitting: Physician Assistant

## 2017-05-25 DIAGNOSIS — I5030 Unspecified diastolic (congestive) heart failure: Secondary | ICD-10-CM | POA: Diagnosis not present

## 2017-05-29 DIAGNOSIS — L97919 Non-pressure chronic ulcer of unspecified part of right lower leg with unspecified severity: Secondary | ICD-10-CM | POA: Diagnosis not present

## 2017-05-29 DIAGNOSIS — L97819 Non-pressure chronic ulcer of other part of right lower leg with unspecified severity: Secondary | ICD-10-CM | POA: Diagnosis not present

## 2017-05-29 DIAGNOSIS — Z87891 Personal history of nicotine dependence: Secondary | ICD-10-CM | POA: Diagnosis not present

## 2017-05-29 DIAGNOSIS — I110 Hypertensive heart disease with heart failure: Secondary | ICD-10-CM | POA: Diagnosis not present

## 2017-05-29 DIAGNOSIS — J9610 Chronic respiratory failure, unspecified whether with hypoxia or hypercapnia: Secondary | ICD-10-CM | POA: Diagnosis not present

## 2017-05-29 DIAGNOSIS — I890 Lymphedema, not elsewhere classified: Secondary | ICD-10-CM | POA: Diagnosis not present

## 2017-05-29 DIAGNOSIS — Z602 Problems related to living alone: Secondary | ICD-10-CM | POA: Diagnosis not present

## 2017-05-29 DIAGNOSIS — I5040 Unspecified combined systolic (congestive) and diastolic (congestive) heart failure: Secondary | ICD-10-CM | POA: Diagnosis not present

## 2017-05-29 DIAGNOSIS — J449 Chronic obstructive pulmonary disease, unspecified: Secondary | ICD-10-CM | POA: Diagnosis not present

## 2017-05-29 DIAGNOSIS — Z9981 Dependence on supplemental oxygen: Secondary | ICD-10-CM | POA: Diagnosis not present

## 2017-05-29 DIAGNOSIS — I87393 Chronic venous hypertension (idiopathic) with other complications of bilateral lower extremity: Secondary | ICD-10-CM | POA: Diagnosis not present

## 2017-06-04 DIAGNOSIS — Z602 Problems related to living alone: Secondary | ICD-10-CM | POA: Diagnosis not present

## 2017-06-04 DIAGNOSIS — L97919 Non-pressure chronic ulcer of unspecified part of right lower leg with unspecified severity: Secondary | ICD-10-CM | POA: Diagnosis not present

## 2017-06-04 DIAGNOSIS — J9610 Chronic respiratory failure, unspecified whether with hypoxia or hypercapnia: Secondary | ICD-10-CM | POA: Diagnosis not present

## 2017-06-04 DIAGNOSIS — I890 Lymphedema, not elsewhere classified: Secondary | ICD-10-CM | POA: Diagnosis not present

## 2017-06-04 DIAGNOSIS — Z87891 Personal history of nicotine dependence: Secondary | ICD-10-CM | POA: Diagnosis not present

## 2017-06-04 DIAGNOSIS — Z9981 Dependence on supplemental oxygen: Secondary | ICD-10-CM | POA: Diagnosis not present

## 2017-06-04 DIAGNOSIS — I87393 Chronic venous hypertension (idiopathic) with other complications of bilateral lower extremity: Secondary | ICD-10-CM | POA: Diagnosis not present

## 2017-06-04 DIAGNOSIS — J449 Chronic obstructive pulmonary disease, unspecified: Secondary | ICD-10-CM | POA: Diagnosis not present

## 2017-06-04 DIAGNOSIS — I110 Hypertensive heart disease with heart failure: Secondary | ICD-10-CM | POA: Diagnosis not present

## 2017-06-04 DIAGNOSIS — L97819 Non-pressure chronic ulcer of other part of right lower leg with unspecified severity: Secondary | ICD-10-CM | POA: Diagnosis not present

## 2017-06-04 DIAGNOSIS — I5040 Unspecified combined systolic (congestive) and diastolic (congestive) heart failure: Secondary | ICD-10-CM | POA: Diagnosis not present

## 2017-06-12 DIAGNOSIS — L97819 Non-pressure chronic ulcer of other part of right lower leg with unspecified severity: Secondary | ICD-10-CM | POA: Diagnosis not present

## 2017-06-12 DIAGNOSIS — J9610 Chronic respiratory failure, unspecified whether with hypoxia or hypercapnia: Secondary | ICD-10-CM | POA: Diagnosis not present

## 2017-06-12 DIAGNOSIS — I110 Hypertensive heart disease with heart failure: Secondary | ICD-10-CM | POA: Diagnosis not present

## 2017-06-12 DIAGNOSIS — I5040 Unspecified combined systolic (congestive) and diastolic (congestive) heart failure: Secondary | ICD-10-CM | POA: Diagnosis not present

## 2017-06-12 DIAGNOSIS — I890 Lymphedema, not elsewhere classified: Secondary | ICD-10-CM | POA: Diagnosis not present

## 2017-06-12 DIAGNOSIS — J449 Chronic obstructive pulmonary disease, unspecified: Secondary | ICD-10-CM | POA: Diagnosis not present

## 2017-06-12 DIAGNOSIS — L97919 Non-pressure chronic ulcer of unspecified part of right lower leg with unspecified severity: Secondary | ICD-10-CM | POA: Diagnosis not present

## 2017-06-12 DIAGNOSIS — I87393 Chronic venous hypertension (idiopathic) with other complications of bilateral lower extremity: Secondary | ICD-10-CM | POA: Diagnosis not present

## 2017-06-12 DIAGNOSIS — Z9981 Dependence on supplemental oxygen: Secondary | ICD-10-CM | POA: Diagnosis not present

## 2017-06-12 DIAGNOSIS — Z602 Problems related to living alone: Secondary | ICD-10-CM | POA: Diagnosis not present

## 2017-06-12 DIAGNOSIS — Z87891 Personal history of nicotine dependence: Secondary | ICD-10-CM | POA: Diagnosis not present

## 2017-06-19 DIAGNOSIS — J9610 Chronic respiratory failure, unspecified whether with hypoxia or hypercapnia: Secondary | ICD-10-CM | POA: Diagnosis not present

## 2017-06-19 DIAGNOSIS — Z602 Problems related to living alone: Secondary | ICD-10-CM | POA: Diagnosis not present

## 2017-06-19 DIAGNOSIS — L97919 Non-pressure chronic ulcer of unspecified part of right lower leg with unspecified severity: Secondary | ICD-10-CM | POA: Diagnosis not present

## 2017-06-19 DIAGNOSIS — Z87891 Personal history of nicotine dependence: Secondary | ICD-10-CM | POA: Diagnosis not present

## 2017-06-19 DIAGNOSIS — Z9981 Dependence on supplemental oxygen: Secondary | ICD-10-CM | POA: Diagnosis not present

## 2017-06-19 DIAGNOSIS — I5040 Unspecified combined systolic (congestive) and diastolic (congestive) heart failure: Secondary | ICD-10-CM | POA: Diagnosis not present

## 2017-06-19 DIAGNOSIS — I87393 Chronic venous hypertension (idiopathic) with other complications of bilateral lower extremity: Secondary | ICD-10-CM | POA: Diagnosis not present

## 2017-06-19 DIAGNOSIS — L97819 Non-pressure chronic ulcer of other part of right lower leg with unspecified severity: Secondary | ICD-10-CM | POA: Diagnosis not present

## 2017-06-19 DIAGNOSIS — I110 Hypertensive heart disease with heart failure: Secondary | ICD-10-CM | POA: Diagnosis not present

## 2017-06-19 DIAGNOSIS — I890 Lymphedema, not elsewhere classified: Secondary | ICD-10-CM | POA: Diagnosis not present

## 2017-06-19 DIAGNOSIS — J449 Chronic obstructive pulmonary disease, unspecified: Secondary | ICD-10-CM | POA: Diagnosis not present

## 2017-06-25 DIAGNOSIS — I5030 Unspecified diastolic (congestive) heart failure: Secondary | ICD-10-CM | POA: Diagnosis not present

## 2017-06-26 DIAGNOSIS — J449 Chronic obstructive pulmonary disease, unspecified: Secondary | ICD-10-CM | POA: Diagnosis not present

## 2017-06-26 DIAGNOSIS — Z87891 Personal history of nicotine dependence: Secondary | ICD-10-CM | POA: Diagnosis not present

## 2017-06-26 DIAGNOSIS — I5040 Unspecified combined systolic (congestive) and diastolic (congestive) heart failure: Secondary | ICD-10-CM | POA: Diagnosis not present

## 2017-06-26 DIAGNOSIS — I87393 Chronic venous hypertension (idiopathic) with other complications of bilateral lower extremity: Secondary | ICD-10-CM | POA: Diagnosis not present

## 2017-06-26 DIAGNOSIS — Z602 Problems related to living alone: Secondary | ICD-10-CM | POA: Diagnosis not present

## 2017-06-26 DIAGNOSIS — J9610 Chronic respiratory failure, unspecified whether with hypoxia or hypercapnia: Secondary | ICD-10-CM | POA: Diagnosis not present

## 2017-06-26 DIAGNOSIS — I110 Hypertensive heart disease with heart failure: Secondary | ICD-10-CM | POA: Diagnosis not present

## 2017-06-26 DIAGNOSIS — Z9119 Patient's noncompliance with other medical treatment and regimen: Secondary | ICD-10-CM | POA: Diagnosis not present

## 2017-06-26 DIAGNOSIS — I5033 Acute on chronic diastolic (congestive) heart failure: Secondary | ICD-10-CM | POA: Diagnosis not present

## 2017-06-26 DIAGNOSIS — Z9981 Dependence on supplemental oxygen: Secondary | ICD-10-CM | POA: Diagnosis not present

## 2017-06-26 DIAGNOSIS — J441 Chronic obstructive pulmonary disease with (acute) exacerbation: Secondary | ICD-10-CM | POA: Diagnosis not present

## 2017-06-26 DIAGNOSIS — I890 Lymphedema, not elsewhere classified: Secondary | ICD-10-CM | POA: Diagnosis not present

## 2017-07-03 DIAGNOSIS — Z602 Problems related to living alone: Secondary | ICD-10-CM | POA: Diagnosis not present

## 2017-07-03 DIAGNOSIS — J441 Chronic obstructive pulmonary disease with (acute) exacerbation: Secondary | ICD-10-CM | POA: Diagnosis not present

## 2017-07-03 DIAGNOSIS — J449 Chronic obstructive pulmonary disease, unspecified: Secondary | ICD-10-CM | POA: Diagnosis not present

## 2017-07-03 DIAGNOSIS — J9610 Chronic respiratory failure, unspecified whether with hypoxia or hypercapnia: Secondary | ICD-10-CM | POA: Diagnosis not present

## 2017-07-03 DIAGNOSIS — I110 Hypertensive heart disease with heart failure: Secondary | ICD-10-CM | POA: Diagnosis not present

## 2017-07-03 DIAGNOSIS — Z9981 Dependence on supplemental oxygen: Secondary | ICD-10-CM | POA: Diagnosis not present

## 2017-07-03 DIAGNOSIS — I890 Lymphedema, not elsewhere classified: Secondary | ICD-10-CM | POA: Diagnosis not present

## 2017-07-03 DIAGNOSIS — I5040 Unspecified combined systolic (congestive) and diastolic (congestive) heart failure: Secondary | ICD-10-CM | POA: Diagnosis not present

## 2017-07-03 DIAGNOSIS — Z87891 Personal history of nicotine dependence: Secondary | ICD-10-CM | POA: Diagnosis not present

## 2017-07-03 DIAGNOSIS — I5033 Acute on chronic diastolic (congestive) heart failure: Secondary | ICD-10-CM | POA: Diagnosis not present

## 2017-07-03 DIAGNOSIS — I87393 Chronic venous hypertension (idiopathic) with other complications of bilateral lower extremity: Secondary | ICD-10-CM | POA: Diagnosis not present

## 2017-07-03 DIAGNOSIS — Z9119 Patient's noncompliance with other medical treatment and regimen: Secondary | ICD-10-CM | POA: Diagnosis not present

## 2017-07-10 DIAGNOSIS — I8310 Varicose veins of unspecified lower extremity with inflammation: Secondary | ICD-10-CM | POA: Diagnosis not present

## 2017-07-10 DIAGNOSIS — I5030 Unspecified diastolic (congestive) heart failure: Secondary | ICD-10-CM | POA: Diagnosis not present

## 2017-07-10 DIAGNOSIS — I87303 Chronic venous hypertension (idiopathic) without complications of bilateral lower extremity: Secondary | ICD-10-CM | POA: Diagnosis not present

## 2017-07-10 DIAGNOSIS — Z79899 Other long term (current) drug therapy: Secondary | ICD-10-CM | POA: Diagnosis not present

## 2017-07-10 DIAGNOSIS — E039 Hypothyroidism, unspecified: Secondary | ICD-10-CM | POA: Diagnosis not present

## 2017-07-10 DIAGNOSIS — E782 Mixed hyperlipidemia: Secondary | ICD-10-CM | POA: Diagnosis not present

## 2017-07-10 DIAGNOSIS — R1310 Dysphagia, unspecified: Secondary | ICD-10-CM | POA: Diagnosis not present

## 2017-07-17 ENCOUNTER — Other Ambulatory Visit: Payer: Self-pay

## 2017-07-17 ENCOUNTER — Encounter (HOSPITAL_COMMUNITY): Payer: Self-pay

## 2017-07-17 ENCOUNTER — Emergency Department (HOSPITAL_COMMUNITY): Payer: Medicare Other

## 2017-07-17 ENCOUNTER — Inpatient Hospital Stay (HOSPITAL_COMMUNITY)
Admission: EM | Admit: 2017-07-17 | Discharge: 2017-07-21 | DRG: 292 | Disposition: A | Discharge status: Home Health | Payer: Medicare Other | Attending: Internal Medicine | Admitting: Internal Medicine

## 2017-07-17 DIAGNOSIS — J9610 Chronic respiratory failure, unspecified whether with hypoxia or hypercapnia: Secondary | ICD-10-CM | POA: Diagnosis not present

## 2017-07-17 DIAGNOSIS — J9611 Chronic respiratory failure with hypoxia: Secondary | ICD-10-CM | POA: Diagnosis present

## 2017-07-17 DIAGNOSIS — K5190 Ulcerative colitis, unspecified, without complications: Secondary | ICD-10-CM | POA: Diagnosis present

## 2017-07-17 DIAGNOSIS — K222 Esophageal obstruction: Secondary | ICD-10-CM | POA: Diagnosis present

## 2017-07-17 DIAGNOSIS — Z87891 Personal history of nicotine dependence: Secondary | ICD-10-CM

## 2017-07-17 DIAGNOSIS — J441 Chronic obstructive pulmonary disease with (acute) exacerbation: Secondary | ICD-10-CM | POA: Diagnosis not present

## 2017-07-17 DIAGNOSIS — E785 Hyperlipidemia, unspecified: Secondary | ICD-10-CM | POA: Diagnosis present

## 2017-07-17 DIAGNOSIS — I5033 Acute on chronic diastolic (congestive) heart failure: Principal | ICD-10-CM | POA: Diagnosis present

## 2017-07-17 DIAGNOSIS — Z79890 Hormone replacement therapy (postmenopausal): Secondary | ICD-10-CM

## 2017-07-17 DIAGNOSIS — D509 Iron deficiency anemia, unspecified: Secondary | ICD-10-CM | POA: Diagnosis not present

## 2017-07-17 DIAGNOSIS — I87393 Chronic venous hypertension (idiopathic) with other complications of bilateral lower extremity: Secondary | ICD-10-CM | POA: Diagnosis not present

## 2017-07-17 DIAGNOSIS — E039 Hypothyroidism, unspecified: Secondary | ICD-10-CM | POA: Diagnosis not present

## 2017-07-17 DIAGNOSIS — J449 Chronic obstructive pulmonary disease, unspecified: Secondary | ICD-10-CM

## 2017-07-17 DIAGNOSIS — D539 Nutritional anemia, unspecified: Secondary | ICD-10-CM | POA: Diagnosis present

## 2017-07-17 DIAGNOSIS — Z9981 Dependence on supplemental oxygen: Secondary | ICD-10-CM | POA: Diagnosis not present

## 2017-07-17 DIAGNOSIS — I5031 Acute diastolic (congestive) heart failure: Secondary | ICD-10-CM | POA: Diagnosis present

## 2017-07-17 DIAGNOSIS — Z79899 Other long term (current) drug therapy: Secondary | ICD-10-CM

## 2017-07-17 DIAGNOSIS — R0602 Shortness of breath: Secondary | ICD-10-CM | POA: Diagnosis not present

## 2017-07-17 DIAGNOSIS — I890 Lymphedema, not elsewhere classified: Secondary | ICD-10-CM | POA: Diagnosis not present

## 2017-07-17 DIAGNOSIS — Z9119 Patient's noncompliance with other medical treatment and regimen: Secondary | ICD-10-CM | POA: Diagnosis not present

## 2017-07-17 DIAGNOSIS — I509 Heart failure, unspecified: Secondary | ICD-10-CM | POA: Diagnosis not present

## 2017-07-17 DIAGNOSIS — I110 Hypertensive heart disease with heart failure: Secondary | ICD-10-CM | POA: Diagnosis not present

## 2017-07-17 DIAGNOSIS — Z602 Problems related to living alone: Secondary | ICD-10-CM | POA: Diagnosis not present

## 2017-07-17 DIAGNOSIS — I5040 Unspecified combined systolic (congestive) and diastolic (congestive) heart failure: Secondary | ICD-10-CM | POA: Diagnosis not present

## 2017-07-17 HISTORY — DX: Esophageal obstruction: K22.2

## 2017-07-17 LAB — I-STAT TROPONIN, ED: TROPONIN I, POC: 0.01 ng/mL (ref 0.00–0.08)

## 2017-07-17 LAB — BRAIN NATRIURETIC PEPTIDE: B NATRIURETIC PEPTIDE 5: 218.5 pg/mL — AB (ref 0.0–100.0)

## 2017-07-17 LAB — BASIC METABOLIC PANEL
Anion gap: 12 (ref 5–15)
BUN: 13 mg/dL (ref 6–20)
CALCIUM: 8.7 mg/dL — AB (ref 8.9–10.3)
CHLORIDE: 94 mmol/L — AB (ref 101–111)
CO2: 36 mmol/L — ABNORMAL HIGH (ref 22–32)
CREATININE: 1.01 mg/dL — AB (ref 0.44–1.00)
GFR calc non Af Amer: 54 mL/min — ABNORMAL LOW (ref >60)
Glucose, Bld: 70 mg/dL (ref 65–99)
Potassium: 4.1 mmol/L (ref 3.5–5.1)
SODIUM: 142 mmol/L (ref 135–145)

## 2017-07-17 LAB — CBC
HCT: 36.3 % (ref 36.0–46.0)
Hemoglobin: 9.9 g/dL — ABNORMAL LOW (ref 12.0–15.0)
MCH: 28.9 pg (ref 26.0–34.0)
MCHC: 27.3 g/dL — ABNORMAL LOW (ref 30.0–36.0)
MCV: 105.8 fL — ABNORMAL HIGH (ref 78.0–100.0)
PLATELETS: 144 10*3/uL — AB (ref 150–400)
RBC: 3.43 MIL/uL — ABNORMAL LOW (ref 3.87–5.11)
RDW: 15.8 % — ABNORMAL HIGH (ref 11.5–15.5)
WBC: 4.8 10*3/uL (ref 4.0–10.5)

## 2017-07-17 IMAGING — DX DG CHEST 2V
2 series · 2 of 2 positions shown · non-contrast
Comparison: [DATE] and earlier.

CLINICAL DATA: 71-year-old female with shortness of breath for 1
week. Lethargy. Lower extremity swelling.

EXAM:
CHEST  2 VIEW

[chest pa]
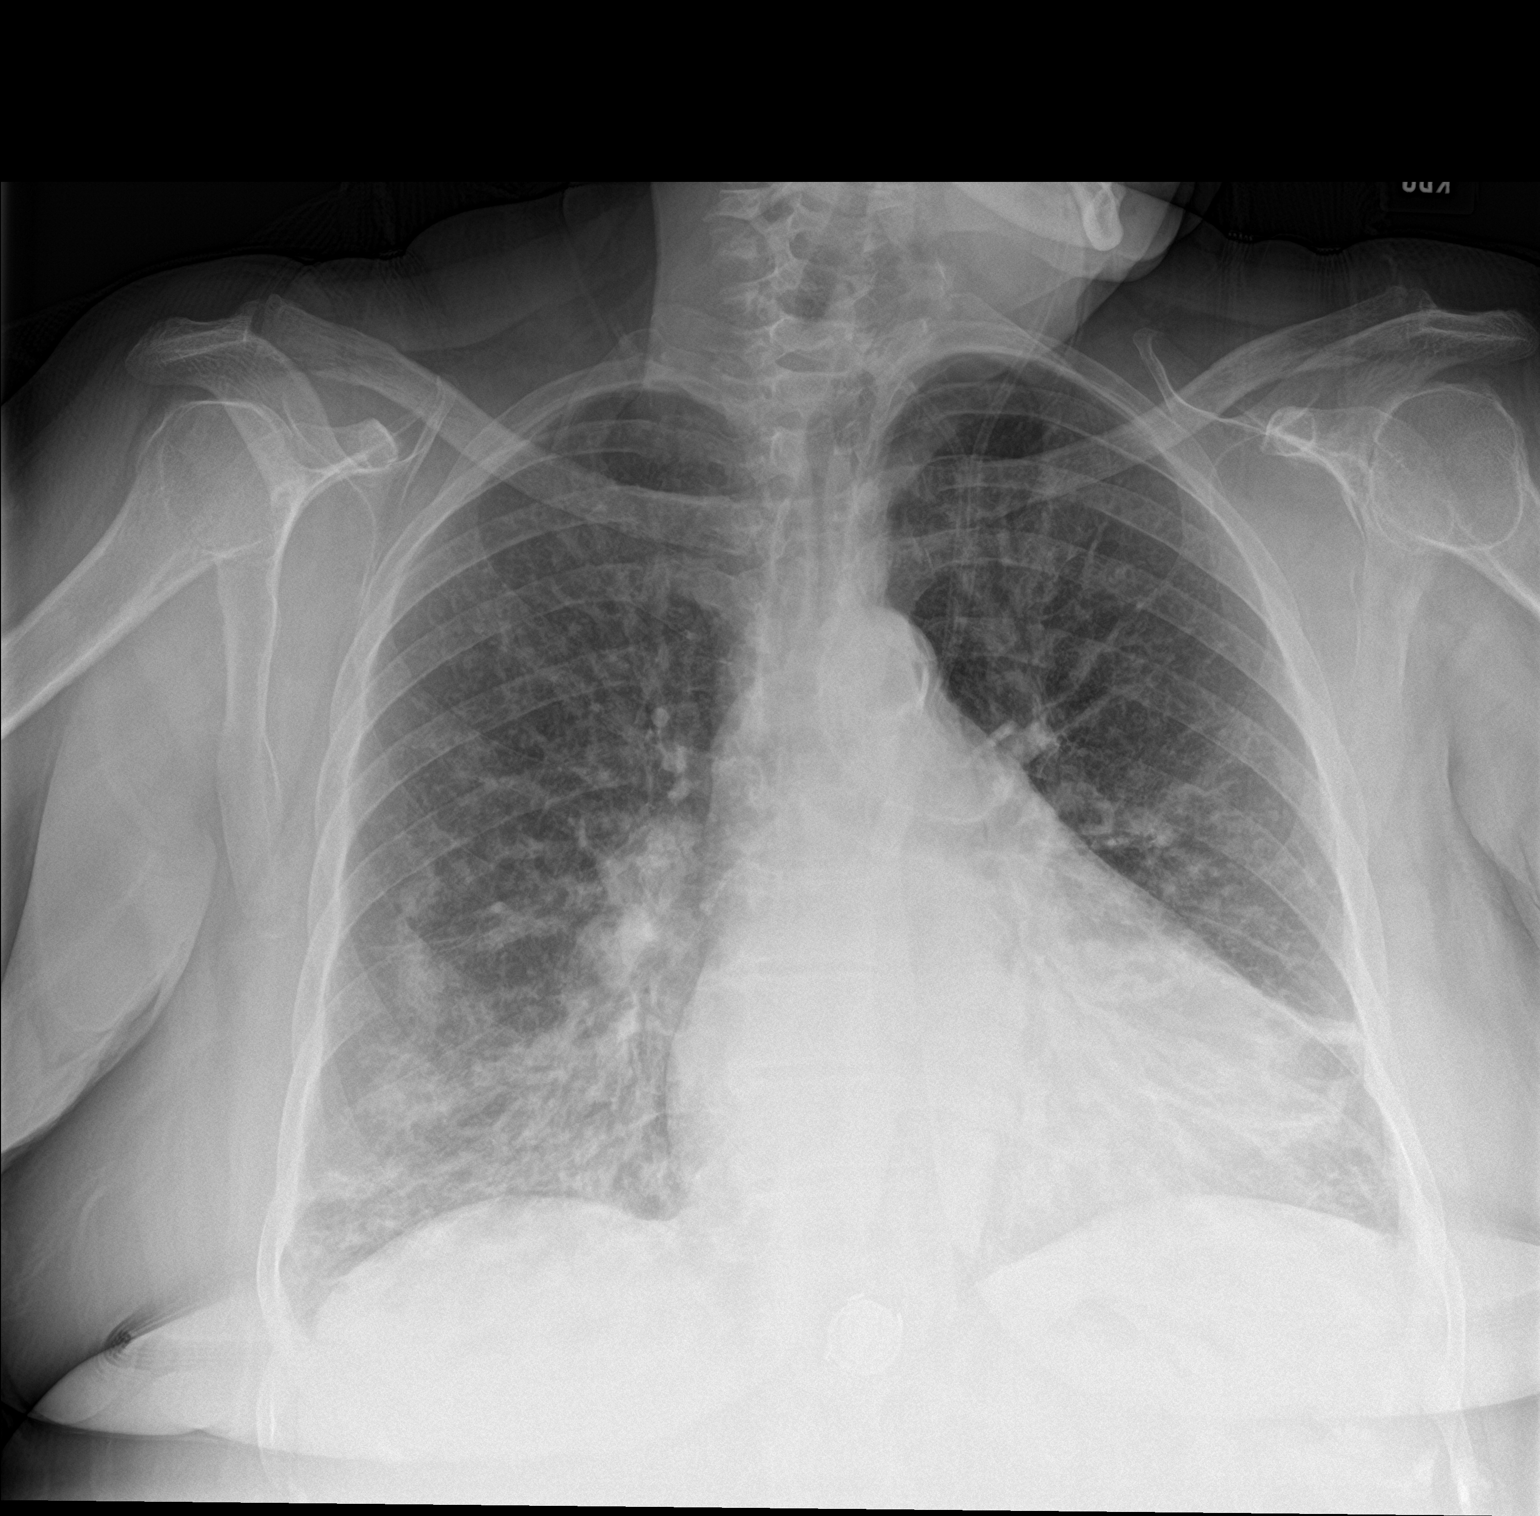

[chest lat]
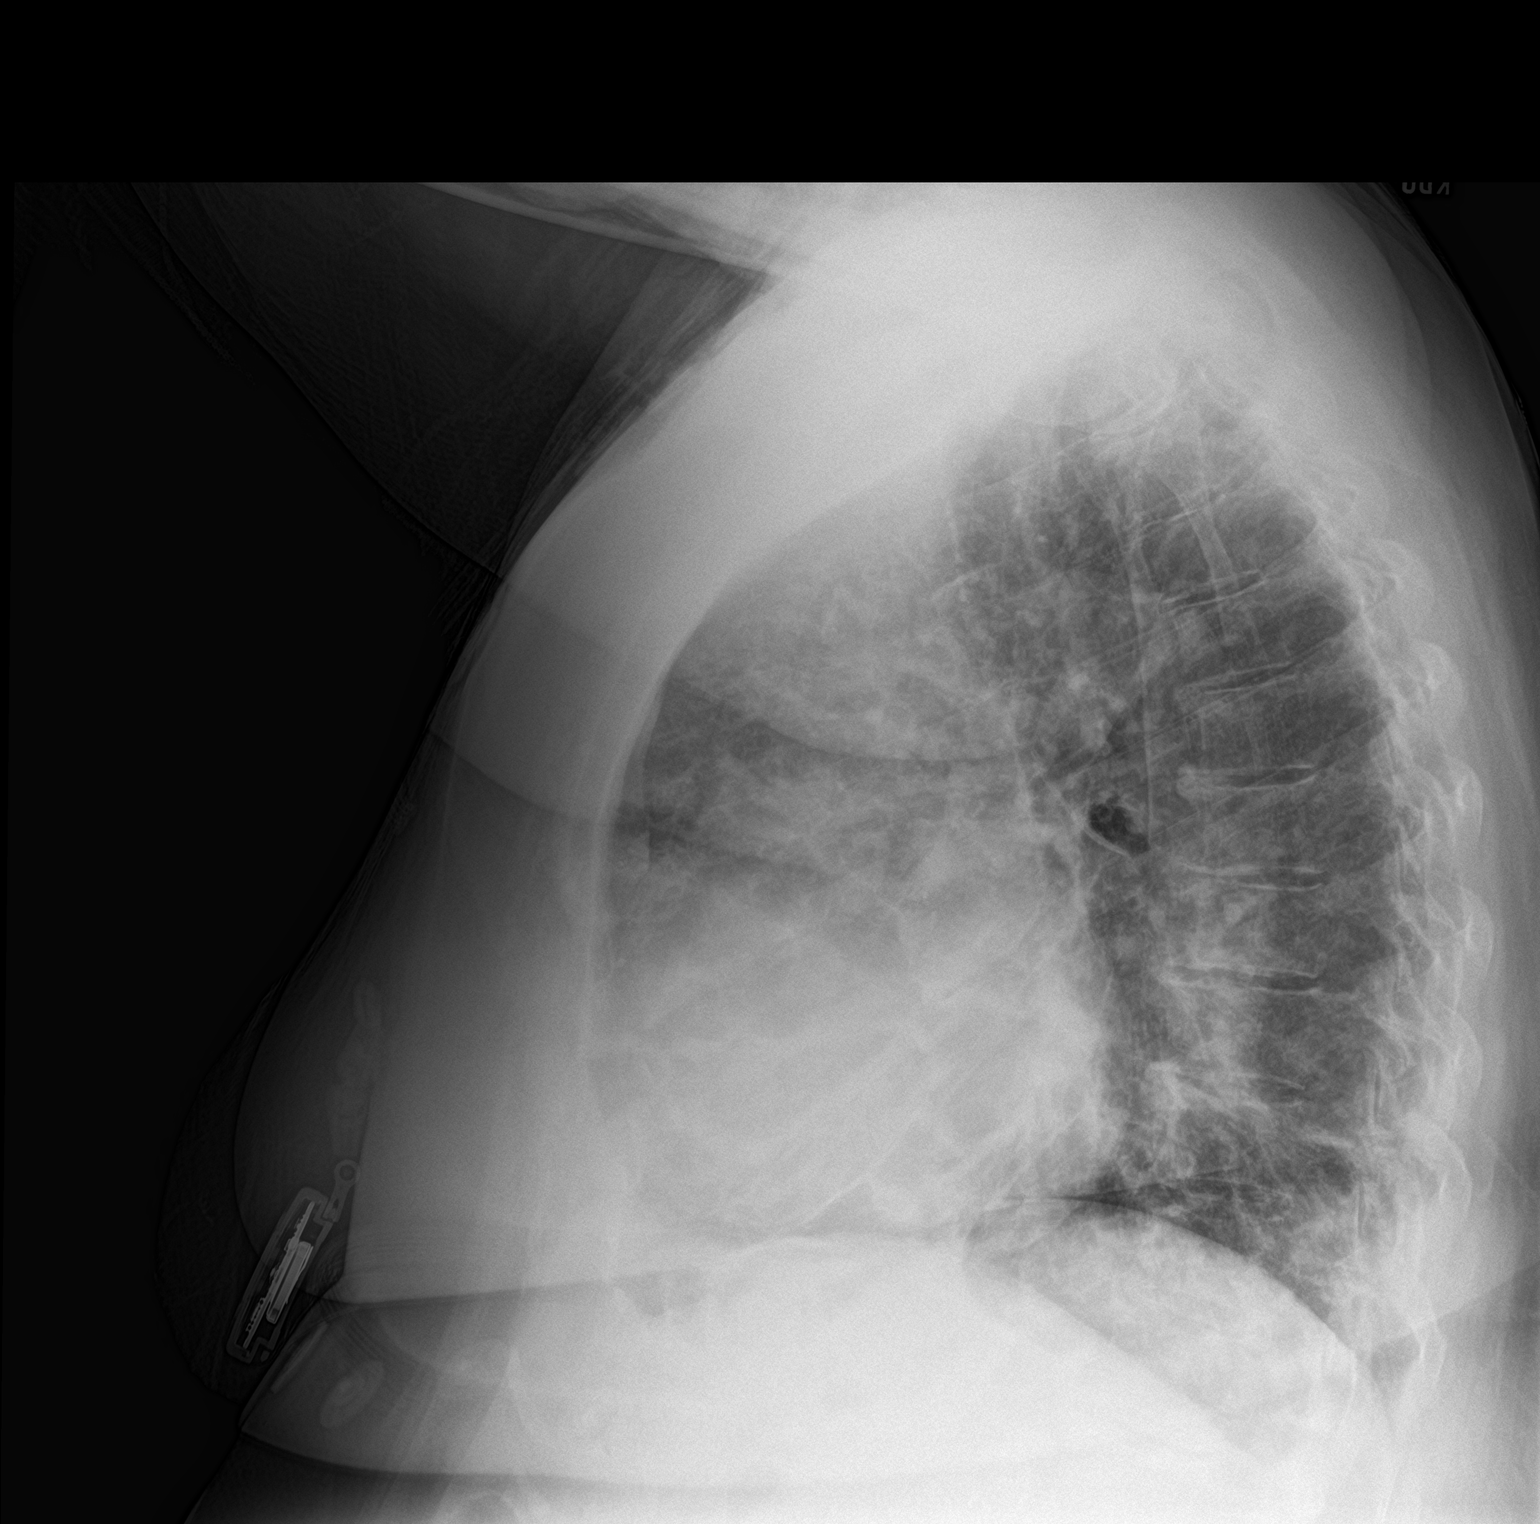

[2 of 2 positions shown; findings below may reference images not displayed]

FINDINGS: Stable lung volumes. Stable cardiomegaly and mediastinal contours.
Calcified aortic atherosclerosis. No pneumothorax. Pulmonary
vascularity appears increased diffusely and there are basilar
predominant bilateral increased pulmonary interstitial markings.
However, no pleural effusion identified. No consolidation. No acute
osseous abnormality identified. Round metallic external artifact
projecting over the diaphragm at midline is seen to be anterior and
external on the lateral view.
IMPRESSION: 1. Acute basilar predominant bilateral increased interstitial
opacity. Differential considerations include pulmonary interstitial
edema and viral/atypical respiratory infection. No pleural effusion.
2. Stable cardiomegaly.

## 2017-07-17 MED ORDER — SODIUM CHLORIDE 0.9% FLUSH: 3.0000 mL | INTRAVENOUS | Status: DC | PRN

## 2017-07-17 MED ORDER — IPRATROPIUM-ALBUTEROL 0.5-2.5 (3) MG/3ML IN SOLN
3.0000 mL | Freq: Once | RESPIRATORY_TRACT | Status: AC
  Administered 2017-07-17: 3 mL via RESPIRATORY_TRACT
  Filled 2017-07-17: qty 3

## 2017-07-17 MED ORDER — FUROSEMIDE 10 MG/ML IJ SOLN
40.0000 mg | Freq: Once | INTRAMUSCULAR | Status: AC
  Administered 2017-07-17: 40 mg via INTRAVENOUS
  Filled 2017-07-17: qty 4

## 2017-07-17 MED ORDER — LEVOTHYROXINE SODIUM 100 MCG PO TABS
300.0000 ug | ORAL_TABLET | Freq: Every day | ORAL | Status: DC
  Administered 2017-07-18 – 2017-07-21 (×4): 300 ug via ORAL
  Filled 2017-07-17: qty 3
  Filled 2017-07-17: qty 2
  Filled 2017-07-17 (×3): qty 3

## 2017-07-17 MED ORDER — METOPROLOL TARTRATE 25 MG PO TABS
25.0000 mg | ORAL_TABLET | Freq: Two times a day (BID) | ORAL | Status: DC
  Administered 2017-07-18 – 2017-07-21 (×7): 25 mg via ORAL
  Filled 2017-07-17 (×7): qty 1

## 2017-07-17 MED ORDER — SULFASALAZINE 500 MG PO TABS
1000.0000 mg | ORAL_TABLET | Freq: Four times a day (QID) | ORAL | Status: DC
  Administered 2017-07-18 – 2017-07-21 (×14): 1000 mg via ORAL
  Filled 2017-07-17 (×16): qty 2

## 2017-07-17 MED ORDER — PRAVASTATIN SODIUM 20 MG PO TABS
20.0000 mg | ORAL_TABLET | Freq: Every day | ORAL | Status: DC
  Administered 2017-07-18 – 2017-07-20 (×4): 20 mg via ORAL
  Filled 2017-07-17 (×4): qty 1

## 2017-07-17 MED ORDER — ONDANSETRON HCL 4 MG/2ML IJ SOLN: 4.0000 mg | Freq: Four times a day (QID) | INTRAMUSCULAR | Status: DC | PRN

## 2017-07-17 MED ORDER — METOLAZONE 2.5 MG PO TABS
2.5000 mg | ORAL_TABLET | ORAL | Status: DC
  Administered 2017-07-20: 2.5 mg via ORAL
  Filled 2017-07-17: qty 1

## 2017-07-17 MED ORDER — ACETAMINOPHEN 325 MG PO TABS: 650.0000 mg | ORAL_TABLET | ORAL | Status: DC | PRN

## 2017-07-17 MED ORDER — SPIRONOLACTONE 25 MG PO TABS
25.0000 mg | ORAL_TABLET | Freq: Every day | ORAL | Status: DC
  Administered 2017-07-18 – 2017-07-21 (×4): 25 mg via ORAL
  Filled 2017-07-17 (×4): qty 1

## 2017-07-17 MED ORDER — POTASSIUM CHLORIDE CRYS ER 20 MEQ PO TBCR
20.0000 meq | EXTENDED_RELEASE_TABLET | Freq: Every day | ORAL | Status: DC
  Administered 2017-07-18 – 2017-07-21 (×4): 20 meq via ORAL
  Filled 2017-07-17 (×4): qty 1

## 2017-07-17 MED ORDER — ALBUTEROL SULFATE (2.5 MG/3ML) 0.083% IN NEBU
3.0000 mL | INHALATION_SOLUTION | Freq: Four times a day (QID) | RESPIRATORY_TRACT | Status: DC | PRN
  Administered 2017-07-18 – 2017-07-20 (×4): 3 mL via RESPIRATORY_TRACT
  Filled 2017-07-17 (×5): qty 3

## 2017-07-17 MED ORDER — SODIUM CHLORIDE 0.9% FLUSH
3.0000 mL | Freq: Two times a day (BID) | INTRAVENOUS | Status: DC
  Administered 2017-07-18 – 2017-07-21 (×8): 3 mL via INTRAVENOUS

## 2017-07-17 MED ORDER — SODIUM CHLORIDE 0.9 % IV SOLN: 250.0000 mL | INTRAVENOUS | Status: DC | PRN

## 2017-07-17 MED ORDER — HEPARIN SODIUM (PORCINE) 5000 UNIT/ML IJ SOLN
5000.0000 [IU] | Freq: Three times a day (TID) | INTRAMUSCULAR | Status: DC
  Administered 2017-07-18 – 2017-07-21 (×10): 5000 [IU] via SUBCUTANEOUS
  Filled 2017-07-17 (×12): qty 1

## 2017-07-17 NOTE — ED Notes (Signed)
Lab work, radiology results and vital signs reviewed, no critical results at this time, no change in acuity indicated.  
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
## 2017-07-17 NOTE — ED Triage Notes (Addendum)
Pt brought in by family, c/o SOB. Pt alert and oriented. States increased shortness of breath X1 week. Pt states she has not had to increase her O2 requirement. Pt somewhat lethargic in triage, BP noted to be low as well. Pt also reports increased leg swelling.

## 2017-07-17 NOTE — ED Notes (Signed)
Assumed care on pt. , nebulizer treatment in progress, Lasix 40 mg IV given , O2 sat= 98% , respirations unlabored , IV site unremarkable .
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
## 2017-07-18 ENCOUNTER — Observation Stay (HOSPITAL_BASED_OUTPATIENT_CLINIC_OR_DEPARTMENT_OTHER): Payer: Medicare Other

## 2017-07-18 ENCOUNTER — Other Ambulatory Visit: Payer: Self-pay

## 2017-07-18 DIAGNOSIS — I5033 Acute on chronic diastolic (congestive) heart failure: Secondary | ICD-10-CM | POA: Diagnosis present

## 2017-07-18 DIAGNOSIS — I5031 Acute diastolic (congestive) heart failure: Secondary | ICD-10-CM | POA: Diagnosis not present

## 2017-07-18 DIAGNOSIS — Z79899 Other long term (current) drug therapy: Secondary | ICD-10-CM | POA: Diagnosis not present

## 2017-07-18 DIAGNOSIS — K5190 Ulcerative colitis, unspecified, without complications: Secondary | ICD-10-CM | POA: Diagnosis present

## 2017-07-18 DIAGNOSIS — Z79890 Hormone replacement therapy (postmenopausal): Secondary | ICD-10-CM | POA: Diagnosis not present

## 2017-07-18 DIAGNOSIS — Z87891 Personal history of nicotine dependence: Secondary | ICD-10-CM | POA: Diagnosis not present

## 2017-07-18 DIAGNOSIS — I340 Nonrheumatic mitral (valve) insufficiency: Secondary | ICD-10-CM

## 2017-07-18 DIAGNOSIS — J9611 Chronic respiratory failure with hypoxia: Secondary | ICD-10-CM | POA: Diagnosis present

## 2017-07-18 DIAGNOSIS — R0602 Shortness of breath: Secondary | ICD-10-CM | POA: Diagnosis present

## 2017-07-18 DIAGNOSIS — I361 Nonrheumatic tricuspid (valve) insufficiency: Secondary | ICD-10-CM | POA: Diagnosis not present

## 2017-07-18 DIAGNOSIS — E039 Hypothyroidism, unspecified: Secondary | ICD-10-CM | POA: Diagnosis present

## 2017-07-18 DIAGNOSIS — Z9981 Dependence on supplemental oxygen: Secondary | ICD-10-CM | POA: Diagnosis not present

## 2017-07-18 DIAGNOSIS — J441 Chronic obstructive pulmonary disease with (acute) exacerbation: Secondary | ICD-10-CM | POA: Diagnosis present

## 2017-07-18 DIAGNOSIS — D509 Iron deficiency anemia, unspecified: Secondary | ICD-10-CM | POA: Diagnosis present

## 2017-07-18 DIAGNOSIS — D539 Nutritional anemia, unspecified: Secondary | ICD-10-CM | POA: Diagnosis present

## 2017-07-18 DIAGNOSIS — I509 Heart failure, unspecified: Secondary | ICD-10-CM | POA: Diagnosis not present

## 2017-07-18 DIAGNOSIS — E785 Hyperlipidemia, unspecified: Secondary | ICD-10-CM | POA: Diagnosis present

## 2017-07-18 DIAGNOSIS — K222 Esophageal obstruction: Secondary | ICD-10-CM | POA: Diagnosis present

## 2017-07-18 DIAGNOSIS — I8310 Varicose veins of unspecified lower extremity with inflammation: Secondary | ICD-10-CM | POA: Diagnosis not present

## 2017-07-18 LAB — BASIC METABOLIC PANEL
ANION GAP: 8 (ref 5–15)
BUN: 14 mg/dL (ref 6–20)
CO2: 40 mmol/L — ABNORMAL HIGH (ref 22–32)
Calcium: 8.7 mg/dL — ABNORMAL LOW (ref 8.9–10.3)
Chloride: 94 mmol/L — ABNORMAL LOW (ref 101–111)
Creatinine, Ser: 1.04 mg/dL — ABNORMAL HIGH (ref 0.44–1.00)
GFR calc Af Amer: >60 mL/min (ref >60)
GFR, EST NON AFRICAN AMERICAN: 52 mL/min — AB (ref >60)
GLUCOSE: 103 mg/dL — AB (ref 65–99)
POTASSIUM: 3.9 mmol/L (ref 3.5–5.1)
SODIUM: 142 mmol/L (ref 135–145)

## 2017-07-18 LAB — ECHOCARDIOGRAM COMPLETE
Height: 64 in
WEIGHTICAEL: 4849.6 [oz_av]

## 2017-07-18 MED ORDER — BENZONATATE 100 MG PO CAPS
200.0000 mg | ORAL_CAPSULE | Freq: Three times a day (TID) | ORAL | Status: DC | PRN
  Administered 2017-07-18 – 2017-07-19 (×2): 200 mg via ORAL
  Filled 2017-07-18 (×3): qty 2

## 2017-07-18 MED ORDER — FUROSEMIDE 10 MG/ML IJ SOLN
80.0000 mg | Freq: Two times a day (BID) | INTRAMUSCULAR | Status: DC
  Administered 2017-07-18 – 2017-07-21 (×7): 80 mg via INTRAVENOUS
  Filled 2017-07-18 (×7): qty 8

## 2017-07-18 NOTE — Consult Note (Signed)
   [facility] CM Inpatient Consult   07/18/2017  NEELY KAMMERER May 14, 1946 [id number]  Patient screened for potential [facility] Management services. Patient is in the [facility] of the [place] Management services under patient's [employer] plan.  Patient had been previously outreached by the [facility] RN Telephonic staff in the past.  Met with the patient at the bedside regarding starting services with [facility].  Staff here to take her to ECHO lab. For questions contact:   Natividad Brood, RN BSN [facility] Liaison  [phone] business mobile phone Toll free office [phone]  1620: Patient back from procedure.  Explained [facility] Management services.  Patient states she lives in [place] and has home health weekly RN for [employer] changes.  She see the PA at [facility] in [place].  She wanted to reviewed information a brochure, 24 hour nurse advise line given.  She lives alone and encouraged her to call.  For questions, please contact:  Natividad Brood, RN BSN [facility] Liaison  [phone] business mobile phone Toll free office [phone]

## 2017-07-18 NOTE — Progress Notes (Signed)
Notified Dr. Herbert Moors that patient had some bigemity PVC's this morning.

## 2017-07-18 NOTE — Progress Notes (Signed)
PROGRESS NOTE    Jamie Montoya  GEX:[id number] DOB: 01-21-1946 DOA: 07/17/2017 PCP: Cyndi Bender, PA-C   Brief Narrative:  Jamie Montoya is a 72 year old woman with past medical history relevant for stage III obesity ulcerative colitis, esophageal stricture, paroxysmal SVT, hyperlipidemia, hypothyroidism, COPD on approximately 3 L nasal cannula at home, chronic diastolic congestive heart failure admitted for worsening shortness of breath concerning for congestive heart failure exacerbation.   Assessment & Plan:   Active Problems:   CHF exacerbation [facility])   Jamie Montoya is a 72 year old woman with past medical history relevant for stage III obesity ulcerative colitis, esophageal stricture, paroxysmal SVT, hyperlipidemia, hypothyroidism, COPD on approximately 3 L nasal cannula at home, chronic diastolic congestive heart failure admitted for worsening shortness of breath concerning for congestive heart failure exacerbation.  ##) Acute on chronic diastolic CHF exacerbation: - echo 07/18/17 pending - cont IV furosemide 52m BID - will give 1 dose of metolazone 178m - 2g Na restricted, 1.5L fluid restriction - strict I/Os, weigh QD  - cont metoprolol tartrate 2520mID - cont spironolactone 29m54m  ##) COPD on 3LNC:  - cont PRN duonebs  ##) Macrocytic anemia: increasing  -We will check folate, vitamin B12, TSH  ##) Hypothyroidism: - cont levothyroxine 300mc82mheck TSH  ##) HLD: - cont pravastatin 20mg 47m ##) UC:  - cont sulfaslalzine 1000mg Q74mFEN: - fluids: 1.5L restriction - electrolytes: monitor and supp - nutrition: 2gNa restricted diet  Prophy: enox  Dispo: pending improved symptoms  FULL CODE   Consultants:   None  Procedures: (Don't include imaging studies which can be auto populated. Include things that cannot be auto populated i.e. Echo, Carotid and venous dopplers, Foley, Bipap, HD, tubes/drains, wound vac, central lines etc)  07/18/17 Echo  pending  Antimicrobials: (specify start and planned stop date. Auto populated tables are space occupying and do not give end dates)  none    Subjective: Pt reports acute on chronic SOB and DOE.   Objective: Vitals:   07/18/17 0005 07/18/17 0429 07/18/17 0802 07/18/17 1205  BP: (!) 133/43 (!) 148/36 (!) 124/39 (!) 132/40  Pulse: 82 77 76 85  Resp: 18 18 18 18   Temp: (!) 97.4 F (36.3 C) 98.1 F (36.7 C) 98 F (36.7 C) 98 F (36.7 C)  TempSrc: Oral Oral Oral Oral  SpO2: 95% 91% 92% 92%  Weight: (!) 137.5 kg (303 lb 1.6 oz)     Height: 5' 4"  (1.626 m)       Intake/Output Summary (Last 24 hours) at 07/18/2017 1340 Last data filed at 07/18/2017 1206 Gross per 24 hour  Intake 363 ml  Output 1240 ml  Net -877 ml   Filed Weights   07/18/17 0005  Weight: (!) 137.5 kg (303 lb 1.6 oz)    Examination:  General exam: Appears calm and comfortable, obese Respiratory system: slightly increased WOB, crackles at bases Cardiovascular system: distant heart sounds, chronic brawny LE edema Gastrointestinal system: Abdomen is nondistended, soft and nontender. +BS Central nervous system: Alert and oriented. No focal neurological deficits. Extremities: LE swelling Skin: chronic venous stasis changes in LE Psychiatry: Judgement and insight appear normal. Mood & affect appropriate.     Data Reviewed: I have personally reviewed following labs and imaging studies  CBC: Recent Labs  Lab 07/17/17 1758  WBC 4.8  HGB 9.9*  HCT 36.3  MCV 105.8*  PLT 144*   010ic Metabolic Panel: Recent Labs  Lab 07/17/17 1758 07/18/17 [id number]  NA 142 142  K 4.1 3.9  CL 94* 94*  CO2 36* 40*  GLUCOSE 70 103*  BUN 13 14  CREATININE 1.01* 1.04*  CALCIUM 8.7* 8.7*   GFR: Estimated Creatinine Clearance: 66.8 mL/min (A) (by C-G formula based on SCr of 1.04 mg/dL (H)). Liver Function Tests: No results for input(s): AST, ALT, ALKPHOS, BILITOT, PROT, ALBUMIN in the last 168 hours. No results for  input(s): LIPASE, AMYLASE in the last 168 hours. No results for input(s): AMMONIA in the last 168 hours. Coagulation Profile: No results for input(s): INR, PROTIME in the last 168 hours. Cardiac Enzymes: No results for input(s): CKTOTAL, CKMB, CKMBINDEX, TROPONINI in the last 168 hours. BNP (last 3 results) Recent Labs    12/04/16 1245 01/09/17 1613 01/28/17 1043  PROBNP 600* 988* 794*   HbA1C: No results for input(s): HGBA1C in the last 72 hours. CBG: No results for input(s): GLUCAP in the last 168 hours. Lipid Profile: No results for input(s): CHOL, HDL, LDLCALC, TRIG, CHOLHDL, LDLDIRECT in the last 72 hours. Thyroid Function Tests: No results for input(s): TSH, T4TOTAL, FREET4, T3FREE, THYROIDAB in the last 72 hours. Anemia Panel: No results for input(s): VITAMINB12, FOLATE, FERRITIN, TIBC, IRON, RETICCTPCT in the last 72 hours. Sepsis Labs: No results for input(s): PROCALCITON, LATICACIDVEN in the last 168 hours.  No results found for this or any previous visit (from the past 240 hour(s)).       Radiology Studies: Dg Chest 2 View  Result Date: 07/17/2017 CLINICAL DATA:  72 year old female with shortness of breath for 1 week. Lethargy. Lower extremity swelling. EXAM: CHEST  2 VIEW COMPARISON:  08/19/2016 and earlier. FINDINGS: Stable lung volumes. Stable cardiomegaly and mediastinal contours. Calcified aortic atherosclerosis. No pneumothorax. Pulmonary vascularity appears increased diffusely and there are basilar predominant bilateral increased pulmonary interstitial markings. However, no pleural effusion identified. No consolidation. No acute osseous abnormality identified. Round metallic external artifact projecting over the diaphragm at midline is seen to be anterior and external on the lateral view. IMPRESSION: 1. Acute basilar predominant bilateral increased interstitial opacity. Differential considerations include pulmonary interstitial edema and viral/atypical  respiratory infection. No pleural effusion. 2. Stable cardiomegaly. Electronically Signed   By: Genevie Ann M.D.   On: 07/17/2017 19:32        Scheduled Meds: . furosemide  80 mg Intravenous Q12H  . heparin  5,000 Units Subcutaneous Q8H  . levothyroxine  300 mcg Oral QAC breakfast  . [START ON 07/20/2017] metolazone  2.5 mg Oral Once per day on Sun Wed  . metoprolol tartrate  25 mg Oral BID  . potassium chloride SA  20 mEq Oral Daily  . pravastatin  20 mg Oral QHS  . sodium chloride flush  3 mL Intravenous Q12H  . spironolactone  25 mg Oral Daily  . sulfaSALAzine  1,000 mg Oral QID   Continuous Infusions: . sodium chloride       LOS: 0 days    Time spent: [place], MD [facility]  If 7PM-7AM, please contact night-coverage [email or web address] Password [email or web address] 07/18/2017, 1:40 PM

## 2017-07-18 NOTE — Progress Notes (Signed)
Patient is refusing to allow [place] to set the bed or chair alarm. Taught patient why we are wanting to set it (for her safety, to make sure she does not fall). She states she will call if she needs to get up.

## 2017-07-18 NOTE — Care Management Obs Status (Signed)
[facility] NOTIFICATION   Patient Details  Name: Jamie Montoya MRN: [id number] Date of Birth: [birth year]   Medicare Observation Status Notification Given:  Yes    Carles Collet, RN 07/18/2017, 11:59 AM

## 2017-07-18 NOTE — Progress Notes (Signed)
  Echocardiogram 2D Echocardiogram has been performed.  Jamie Montoya 07/18/2017, 2:08 PM

## 2017-07-19 LAB — HEPATIC FUNCTION PANEL
ALT: 11 U/L — ABNORMAL LOW (ref 14–54)
AST: 14 U/L — ABNORMAL LOW (ref 15–41)
Albumin: 3.3 g/dL — ABNORMAL LOW (ref 3.5–5.0)
Alkaline Phosphatase: 79 U/L (ref 38–126)
Bilirubin, Direct: <0.1 mg/dL — ABNORMAL LOW (ref 0.1–0.5)
Total Bilirubin: 0.6 mg/dL (ref 0.3–1.2)
Total Protein: 7.1 g/dL (ref 6.5–8.1)

## 2017-07-19 LAB — CBC
HCT: 34.7 % — ABNORMAL LOW (ref 36.0–46.0)
Hemoglobin: 9.6 g/dL — ABNORMAL LOW (ref 12.0–15.0)
MCH: 28.7 pg (ref 26.0–34.0)
MCHC: 27.7 g/dL — ABNORMAL LOW (ref 30.0–36.0)
MCV: 103.9 fL — ABNORMAL HIGH (ref 78.0–100.0)
Platelets: 156 K/uL (ref 150–400)
RBC: 3.34 MIL/uL — ABNORMAL LOW (ref 3.87–5.11)
RDW: 15.6 % — ABNORMAL HIGH (ref 11.5–15.5)
WBC: 4 K/uL (ref 4.0–10.5)

## 2017-07-19 LAB — BASIC METABOLIC PANEL
Anion gap: 10 (ref 5–15)
BUN: 13 mg/dL (ref 6–20)
CO2: 38 mmol/L — ABNORMAL HIGH (ref 22–32)
CREATININE: 1.14 mg/dL — AB (ref 0.44–1.00)
Calcium: 8.6 mg/dL — ABNORMAL LOW (ref 8.9–10.3)
Chloride: 92 mmol/L — ABNORMAL LOW (ref 101–111)
GFR calc non Af Amer: 47 mL/min — ABNORMAL LOW (ref >60)
GFR, EST AFRICAN AMERICAN: 54 mL/min — AB (ref >60)
Glucose, Bld: 96 mg/dL (ref 65–99)
Potassium: 3.7 mmol/L (ref 3.5–5.1)
SODIUM: 140 mmol/L (ref 135–145)

## 2017-07-19 LAB — VITAMIN B12: Vitamin B-12: 554 pg/mL (ref 180–914)

## 2017-07-19 LAB — TSH: TSH: 0.586 u[IU]/mL (ref 0.350–4.500)

## 2017-07-19 LAB — MAGNESIUM: Magnesium: 1.9 mg/dL (ref 1.7–2.4)

## 2017-07-19 NOTE — Progress Notes (Signed)
The writer went to the patient's room and found her at bedside picking something from the floor. Pt has admitted of having HX of falls prior to hospitalization but has refused bed alarm to be set. She has been educated about safety and implications of falls. The nurse went in with the charge nurse to make the patient aware that if she falls, she will be responsible for the outcome. Pt agreed with the plan and verbalized understanding.

## 2017-07-19 NOTE — Progress Notes (Addendum)
Pt sat in a recliner pretty much time during the day, no any complaints of pain, denies SOB and distress, oxygen continue @2l /min, oxygen saturation 94 @2l /min  Palma Holter, RN

## 2017-07-19 NOTE — Progress Notes (Signed)
PROGRESS NOTE    Jamie Montoya  XLK:[id number] DOB: 1946/05/26 DOA: 07/17/2017 PCP: Cyndi Bender, PA-C   Brief Narrative:  Jamie Montoya is a 72 year old woman with past medical history relevant for stage III obesity ulcerative colitis, esophageal stricture, paroxysmal SVT, hyperlipidemia, hypothyroidism, COPD on approximately 3 L nasal cannula at home, chronic diastolic congestive heart failure admitted for worsening shortness of breath concerning for congestive heart failure exacerbation.   Assessment & Plan:   Active Problems:   CHF exacerbation (HCC)   Acute heart failure with normal ejection fraction [facility])   Jamie Montoya is a 72 year old woman with past medical history relevant for stage III obesity ulcerative colitis, esophageal stricture, paroxysmal SVT, hyperlipidemia, hypothyroidism, COPD on approximately 3 L nasal cannula at home, chronic diastolic congestive heart failure admitted for worsening shortness of breath concerning for congestive heart failure exacerbation.  ##) Acute on chronic diastolic CHF exacerbation: Gradually improving - echo 07/18/17 relatively unchanged and continues to demonstrate grade 2 diastolic dysfunction with pseudo-normal LV filling pattern - cont IV furosemide 82m BID - will give additional 1 dose of metolazone 151m - 2g Na restricted, 1.5L fluid restriction - strict I/Os, weigh QD  - cont metoprolol tartrate 2538mID - cont spironolactone 4m53m  ##) COPD on 3LNC:  - cont PRN duonebs  ##) Macrocytic anemia: increasing  -Vitamin B12 normal -Folate pending  ##) Esophageal stricture: Patient reports approximately 3-4 years ago she did have an esophageal stricture dilatation.  She does report some "and "choking. -will have speech evaluate the patient  ##) Hypothyroidism: - cont levothyroxine 300mc56mSH normal possibly slightly low for age  ##) HLD: - cont pravastatin 20mg 39m ##) UC:  - cont sulfaslalzine 1000mg Q4mFEN: - fluids:  1.5L restriction - electrolytes: monitor and supp - nutrition: 2gNa restricted diet  Prophy: enox  Dispo: pending improved symptoms  FULL CODE   Consultants:   None  Procedures: (Don't include imaging studies which can be auto populated. Include things that cannot be auto populated i.e. Echo, Carotid and venous dopplers, Foley, Bipap, HD, tubes/drains, wound vac, central lines etc)  07/18/17 Echo  - Left ventricle: The cavity size was normal. Systolic function was   normal. The estimated ejection fraction was in the range of 55%   to 60%. Wall motion was normal; there were no regional wall   motion abnormalities. Features are consistent with a pseudonormal   left ventricular filling pattern, with concomitant abnormal   relaxation and increased filling pressure (grade 2 diastolic   dysfunction). - Mitral valve: Calcified annulus. There was mild regurgitation. - Left atrium: The atrium was mildly dilated. - Right ventricle: The cavity size was mildly dilated. Wall   thickness was normal. - Right atrium: The atrium was moderately dilated. - Tricuspid valve: There was moderate regurgitation. - Pulmonary arteries: Systolic pressure was moderately increased.   PA peak pressure: 47 mm Hg (S).   Antimicrobials: (specify start and planned stop date. Auto populated tables are space occupying and do not give end dates)  none    Subjective: Pt reports feeling much better today.  She continues to endorse dyspnea on exertion and orthopnea.  Patient appears significantly debilitated however she is extremely resistant to considering rehab or skilled nursing facility at this time.  She continues to report episodes of "choking" on food.    Objective: Vitals:   07/19/17 0413 07/19/17 0438 07/19/17 0457 07/19/17 1230  BP:  (!) 131/41  (!) 109/34  Pulse:  69 70 67  Resp:  18 18 20   Temp:  97.6 F (36.4 C)  97.7 F (36.5 C)  TempSrc:  Oral  Oral  SpO2:  91% 94% (!) 88%  Weight:  135.9 kg (299 lb 9.6 oz)     Height:        Intake/Output Summary (Last 24 hours) at 07/19/2017 1431 Last data filed at 07/19/2017 0910 Gross per 24 hour  Intake 240 ml  Output 520 ml  Net -280 ml   Filed Weights   07/18/17 0005 07/19/17 0413  Weight: (!) 137.5 kg (303 lb 1.6 oz) 135.9 kg (299 lb 9.6 oz)    Examination:  General exam: Appears calm and comfortable, obese Respiratory system: Breathing comfortably on 3 L nasal cannula, continues to have crackles at the bases cardiovascular system: distant heart sounds, chronic brawny LE edema Gastrointestinal system: Abdomen is nondistended, soft and nontender. +BS Central nervous system: Alert and oriented. No focal neurological deficits. Extremities: LE swelling Skin: chronic venous stasis changes in LE Psychiatry: Judgement and insight appear normal.    Data Reviewed: I have personally reviewed following labs and imaging studies  CBC: Recent Labs  Lab 07/17/17 1758 07/19/17 0714  WBC 4.8 4.0  HGB 9.9* 9.6*  HCT 36.3 34.7*  MCV 105.8* 103.9*  PLT 144* 546   Basic Metabolic Panel: Recent Labs  Lab 07/17/17 1758 07/18/17 0620 07/19/17 0714  NA 142 142 140  K 4.1 3.9 3.7  CL 94* 94* 92*  CO2 36* 40* 38*  GLUCOSE 70 103* 96  BUN 13 14 13   CREATININE 1.01* 1.04* 1.14*  CALCIUM 8.7* 8.7* 8.6*  MG  --   --  1.9   GFR: Estimated Creatinine Clearance: 60.5 mL/min (A) (by C-G formula based on SCr of 1.14 mg/dL (H)). Liver Function Tests: Recent Labs  Lab 07/19/17 0714  AST 14*  ALT 11*  ALKPHOS 79  BILITOT 0.6  PROT 7.1  ALBUMIN 3.3*   No results for input(s): LIPASE, AMYLASE in the last 168 hours. No results for input(s): AMMONIA in the last 168 hours. Coagulation Profile: No results for input(s): INR, PROTIME in the last 168 hours. Cardiac Enzymes: No results for input(s): CKTOTAL, CKMB, CKMBINDEX, TROPONINI in the last 168 hours. BNP (last 3 results) Recent Labs    12/04/16 1245 01/09/17 1613  01/28/17 1043  PROBNP 600* 988* 794*   HbA1C: No results for input(s): HGBA1C in the last 72 hours. CBG: No results for input(s): GLUCAP in the last 168 hours. Lipid Profile: No results for input(s): CHOL, HDL, LDLCALC, TRIG, CHOLHDL, LDLDIRECT in the last 72 hours. Thyroid Function Tests: Recent Labs    07/19/17 0714  TSH 0.586   Anemia Panel: Recent Labs    07/19/17 0714  VITAMINB12 554   Sepsis Labs: No results for input(s): PROCALCITON, LATICACIDVEN in the last 168 hours.  No results found for this or any previous visit (from the past 240 hour(s)).       Radiology Studies: Dg Chest 2 View  Result Date: 07/17/2017 CLINICAL DATA:  72 year old female with shortness of breath for 1 week. Lethargy. Lower extremity swelling. EXAM: CHEST  2 VIEW COMPARISON:  08/19/2016 and earlier. FINDINGS: Stable lung volumes. Stable cardiomegaly and mediastinal contours. Calcified aortic atherosclerosis. No pneumothorax. Pulmonary vascularity appears increased diffusely and there are basilar predominant bilateral increased pulmonary interstitial markings. However, no pleural effusion identified. No consolidation. No acute osseous abnormality identified. Round metallic external artifact projecting over the diaphragm at midline is  seen to be anterior and external on the lateral view. IMPRESSION: 1. Acute basilar predominant bilateral increased interstitial opacity. Differential considerations include pulmonary interstitial edema and viral/atypical respiratory infection. No pleural effusion. 2. Stable cardiomegaly. Electronically Signed   By: Genevie Ann M.D.   On: 07/17/2017 19:32      Scheduled Meds: . furosemide  80 mg Intravenous Q12H  . heparin  5,000 Units Subcutaneous Q8H  . levothyroxine  300 mcg Oral QAC breakfast  . [START ON 07/20/2017] metolazone  2.5 mg Oral Once per day on Sun Wed  . metoprolol tartrate  25 mg Oral BID  . potassium chloride SA  20 mEq Oral Daily  . pravastatin  20  mg Oral QHS  . sodium chloride flush  3 mL Intravenous Q12H  . spironolactone  25 mg Oral Daily  . sulfaSALAzine  1,000 mg Oral QID   Continuous Infusions: . sodium chloride       LOS: 1 day    Time spent: [place], MD [facility]  If 7PM-7AM, please contact night-coverage [email or web address] Password [facility] 07/19/2017, 2:31 PM

## 2017-07-20 LAB — BASIC METABOLIC PANEL
ANION GAP: 13 (ref 5–15)
BUN: 13 mg/dL (ref 6–20)
CALCIUM: 8.4 mg/dL — AB (ref 8.9–10.3)
CO2: 37 mmol/L — ABNORMAL HIGH (ref 22–32)
CREATININE: 1.01 mg/dL — AB (ref 0.44–1.00)
Chloride: 91 mmol/L — ABNORMAL LOW (ref 101–111)
GFR calc Af Amer: >60 mL/min (ref >60)
GFR calc non Af Amer: 54 mL/min — ABNORMAL LOW (ref >60)
GLUCOSE: 95 mg/dL (ref 65–99)
Potassium: 3.6 mmol/L (ref 3.5–5.1)
Sodium: 141 mmol/L (ref 135–145)

## 2017-07-20 LAB — CBC
HCT: 33.2 % — ABNORMAL LOW (ref 36.0–46.0)
Hemoglobin: 9.3 g/dL — ABNORMAL LOW (ref 12.0–15.0)
MCH: 28.7 pg (ref 26.0–34.0)
MCHC: 28 g/dL — ABNORMAL LOW (ref 30.0–36.0)
MCV: 102.5 fL — ABNORMAL HIGH (ref 78.0–100.0)
Platelets: 154 K/uL (ref 150–400)
RBC: 3.24 MIL/uL — ABNORMAL LOW (ref 3.87–5.11)
RDW: 15.6 % — ABNORMAL HIGH (ref 11.5–15.5)
WBC: 3.2 10*3/uL — ABNORMAL LOW (ref 4.0–10.5)

## 2017-07-20 LAB — MAGNESIUM: Magnesium: 1.9 mg/dL (ref 1.7–2.4)

## 2017-07-20 NOTE — Progress Notes (Signed)
PROGRESS NOTE    Jamie Montoya  POE:[id number] DOB: 1946/04/06 DOA: 07/17/2017 PCP: Cyndi Bender, PA-C   Brief Narrative:  Jamie Montoya is a 72 year old woman with past medical history relevant for stage III obesity ulcerative colitis, esophageal stricture, paroxysmal SVT, hyperlipidemia, hypothyroidism, COPD on approximately 3 L nasal cannula at home, chronic diastolic congestive heart failure admitted for worsening shortness of breath concerning for congestive heart failure exacerbation.   Assessment & Plan:   Active Problems:   CHF exacerbation (HCC)   Acute heart failure with normal ejection fraction (HCC)   ##) Acute on chronic diastolic CHF exacerbation: Gradually improving - echo 07/18/17 relatively unchanged and continues to demonstrate grade 2 diastolic dysfunction with pseudo-normal LV filling pattern - cont IV furosemide 63m BID - 2g Na restricted, 1.5L fluid restriction - strict I/Os, weigh QD  - cont metoprolol tartrate 222mBID - cont spironolactone 2583mD  ##) COPD on 3LNC:  - cont PRN duonebs  ##) Macrocytic anemia: -Vitamin B12 normal -Folate pending  ##) Esophageal stricture: Patient reports approximately 3-4 years ago she did have an esophageal stricture dilatation.  She does report some "and "choking. -Pending speech evaluation  ##) Hypothyroidism: - cont levothyroxine 300m25mTSH normal  ##) HLD: - cont pravastatin 20mg32m  ##) UC:  - cont sulfaslalzine 1000mg 71m FEN: - fluids: 1.5L restriction - electrolytes: monitor and supp - nutrition: 2gNa restricted diet  Prophy: enox  Dispo: pending improved symptoms  FULL CODE   Consultants:   None  Procedures: (Don't include imaging studies which can be auto populated. Include things that cannot be auto populated i.e. Echo, Carotid and venous dopplers, Foley, Bipap, HD, tubes/drains, wound vac, central lines etc)  07/18/17 Echo  - Left ventricle: The cavity size was normal. Systolic  function was   normal. The estimated ejection fraction was in the range of 55%   to 60%. Wall motion was normal; there were no regional wall   motion abnormalities. Features are consistent with a pseudonormal   left ventricular filling pattern, with concomitant abnormal   relaxation and increased filling pressure (grade 2 diastolic   dysfunction). - Mitral valve: Calcified annulus. There was mild regurgitation. - Left atrium: The atrium was mildly dilated. - Right ventricle: The cavity size was mildly dilated. Wall   thickness was normal. - Right atrium: The atrium was moderately dilated. - Tricuspid valve: There was moderate regurgitation. - Pulmonary arteries: Systolic pressure was moderately increased.   PA peak pressure: 47 mm Hg (S).   Antimicrobials: (specify start and planned stop date. Auto populated tables are space occupying and do not give end dates)  none    Subjective: Patient reports feeling almost to baseline today.  She continues to have episodes of some hypoxia with ambulation however quickly recovers on home oxygen.  Objective: Vitals:   07/20/17 0343 07/20/17 0554 07/20/17 0613 07/20/17 0938  BP:    110/70  Pulse:      Resp:      Temp:      TempSrc:      SpO2:  (!) 89% 91%   Weight: 135 kg (297 lb 11.2 oz)     Height:        Intake/Output Summary (Last 24 hours) at 07/20/2017 1116 Last data filed at 07/20/2017 0851 Gross per 24 hour  Intake 820 ml  Output 201 ml  Net 619 ml   Filed Weights   07/18/17 0005 07/19/17 0413 07/20/17 0343  Weight: (!) 137.5 kg (303  lb 1.6 oz) 135.9 kg (299 lb 9.6 oz) 135 kg (297 lb 11.2 oz)    Examination:  General exam: Appears calm and comfortable, obese Respiratory system: Breathing comfortably on 3 L nasal cannula, continues to have crackles at the bases cardiovascular system: distant heart sounds, chronic brawny LE edema Gastrointestinal system: Abdomen is nondistended, soft and nontender. +BS Central nervous  system: Alert and oriented. No focal neurological deficits. Extremities: LE swelling, of her left lower extremity noted to have some brawny induration with covering Skin: chronic venous stasis changes in LE Psychiatry: Judgement and insight appear normal.    Data Reviewed: I have personally reviewed following labs and imaging studies  CBC: Recent Labs  Lab 07/17/17 1758 07/19/17 0714 07/20/17 0613  WBC 4.8 4.0 3.2*  HGB 9.9* 9.6* 9.3*  HCT 36.3 34.7* 33.2*  MCV 105.8* 103.9* 102.5*  PLT 144* 156 884   Basic Metabolic Panel: Recent Labs  Lab 07/17/17 1758 07/18/17 0620 07/19/17 0714 07/20/17 0613  NA 142 142 140 141  K 4.1 3.9 3.7 3.6  CL 94* 94* 92* 91*  CO2 36* 40* 38* 37*  GLUCOSE 70 103* 96 95  BUN 13 14 13 13   CREATININE 1.01* 1.04* 1.14* 1.01*  CALCIUM 8.7* 8.7* 8.6* 8.4*  MG  --   --  1.9 1.9   GFR: Estimated Creatinine Clearance: 68 mL/min (A) (by C-G formula based on SCr of 1.01 mg/dL (H)). Liver Function Tests: Recent Labs  Lab 07/19/17 0714  AST 14*  ALT 11*  ALKPHOS 79  BILITOT 0.6  PROT 7.1  ALBUMIN 3.3*   No results for input(s): LIPASE, AMYLASE in the last 168 hours. No results for input(s): AMMONIA in the last 168 hours. Coagulation Profile: No results for input(s): INR, PROTIME in the last 168 hours. Cardiac Enzymes: No results for input(s): CKTOTAL, CKMB, CKMBINDEX, TROPONINI in the last 168 hours. BNP (last 3 results) Recent Labs    12/04/16 1245 01/09/17 1613 01/28/17 1043  PROBNP 600* 988* 794*   HbA1C: No results for input(s): HGBA1C in the last 72 hours. CBG: No results for input(s): GLUCAP in the last 168 hours. Lipid Profile: No results for input(s): CHOL, HDL, LDLCALC, TRIG, CHOLHDL, LDLDIRECT in the last 72 hours. Thyroid Function Tests: Recent Labs    07/19/17 0714  TSH 0.586   Anemia Panel: Recent Labs    07/19/17 0714  VITAMINB12 554   Sepsis Labs: No results for input(s): PROCALCITON, LATICACIDVEN in the  last 168 hours.  No results found for this or any previous visit (from the past 240 hour(s)).       Radiology Studies: No results found.    Scheduled Meds: . furosemide  80 mg Intravenous Q12H  . heparin  5,000 Units Subcutaneous Q8H  . levothyroxine  300 mcg Oral QAC breakfast  . metolazone  2.5 mg Oral Once per day on Sun Wed  . metoprolol tartrate  25 mg Oral BID  . potassium chloride SA  20 mEq Oral Daily  . pravastatin  20 mg Oral QHS  . sodium chloride flush  3 mL Intravenous Q12H  . spironolactone  25 mg Oral Daily  . sulfaSALAzine  1,000 mg Oral QID   Continuous Infusions: . sodium chloride       LOS: 2 days    Time spent: [place], MD [facility]  If 7PM-7AM, please contact night-coverage [email or web address] Password [facility] 07/20/2017, 11:16 AM

## 2017-07-20 NOTE — Progress Notes (Signed)
Pt's unna boots changed by ortho tech after assessing by wound nurse, pt was sitting in a recliner pretty much, on oxygen @2l /min, pt is prepared to be discharged tomorrow asking what time, RN said can't tell her time now unless seen by her MD in am, no any complain of pain, SOB and distress, will continue to monitor  [place]

## 2017-07-20 NOTE — Consult Note (Signed)
[place] Nurse wound consult note Reason for Consult:Chronic lymphedema with resolving full thickness wound on left lateral LE. Weekly Unna's Boot changes are managed by [facility]. Wound type: Venous insufficiency/lymphedema Pressure Injury POA: NA Measurement: Healing full thickness wound measuring 1cm x 2cm x 0.1cm with signs of reepithelialization evident.  Chronic skin changes of lymphedema (irregularities, hemosiderin staining) are apparent. Wound JYN:WGNF Drainage (amount, consistency, odor) scant serous on old Unna's Boot.  Periwound: erythematous, edematous with chronic skin changes Dressing procedure/placement/frequency: It is noted that previous Unna's Boot was incorrectly applied:  Not wrapped high enough and with heel excluded from wrap. Please include wrapping instructions with discharge orders to wrap from just below toes to just below knee with heel included. Patient's Unna's Boot is loose and indicating that edema has diminished since admission. Patient acknowledges that LE elevation while in hospital is much better than she achieves at home. Patient to resume [facility] services upon discharge for continuation of wraps for two weeks post healing. Referral to a lymphedema clinic is indicated for long-term strategy for maintenance of care using pumps, etc. It is not clear if this has been attempted in the past or if patient is a good candidate for this intervention. In the meantime, elevation several times a day is recommended in addition to the wearing of protective shoe wear at all times when ambulating.  [place] nursing team will not follow, but will remain available to this patient, the nursing and medical teams.  Please re-consult if needed. Thanks, Maudie Flakes, MSN, RN, [place], Arther Abbott  Pager# [phone]

## 2017-07-21 DIAGNOSIS — I5031 Acute diastolic (congestive) heart failure: Secondary | ICD-10-CM

## 2017-07-21 LAB — BASIC METABOLIC PANEL
Anion gap: 15 (ref 5–15)
BUN: 12 mg/dL (ref 6–20)
CHLORIDE: 87 mmol/L — AB (ref 101–111)
CO2: 40 mmol/L — ABNORMAL HIGH (ref 22–32)
CREATININE: 1.14 mg/dL — AB (ref 0.44–1.00)
Calcium: 8.4 mg/dL — ABNORMAL LOW (ref 8.9–10.3)
GFR calc non Af Amer: 47 mL/min — ABNORMAL LOW (ref >60)
GFR, EST AFRICAN AMERICAN: 54 mL/min — AB (ref >60)
Glucose, Bld: 88 mg/dL (ref 65–99)
Potassium: 3.1 mmol/L — ABNORMAL LOW (ref 3.5–5.1)
SODIUM: 142 mmol/L (ref 135–145)

## 2017-07-21 LAB — FOLATE RBC
Folate, Hemolysate: 338.8 ng/mL
Folate, RBC: 1065 ng/mL (ref >498)
Hematocrit: 31.8 % — ABNORMAL LOW (ref 34.0–46.6)

## 2017-07-21 LAB — MAGNESIUM: Magnesium: 1.9 mg/dL (ref 1.7–2.4)

## 2017-07-21 MED ORDER — POTASSIUM CHLORIDE CRYS ER 20 MEQ PO TBCR
40.0000 meq | EXTENDED_RELEASE_TABLET | ORAL | Status: DC
  Administered 2017-07-21: 40 meq via ORAL
  Filled 2017-07-21: qty 2

## 2017-07-21 NOTE — Plan of Care (Signed)
  Progressing Health Behavior/Discharge Planning: Ability to manage health-related needs will improve 07/21/2017 0011 - Progressing by Ruben Im, RN Clinical Measurements: Ability to maintain clinical measurements within normal limits will improve 07/21/2017 0011 - Progressing by Ruben Im, RN Will remain free from infection 07/21/2017 0011 - Progressing by Ruben Im, RN Diagnostic test results will improve 07/21/2017 0011 - Progressing by Ruben Im, RN Respiratory complications will improve 07/21/2017 0011 - Progressing by Ruben Im, RN Cardiovascular complication will be avoided 07/21/2017 0011 - Progressing by Ruben Im, RN Activity: Risk for activity intolerance will decrease 07/21/2017 0011 - Progressing by Ruben Im, RN Coping: Level of anxiety will decrease 07/21/2017 0011 - Progressing by Ruben Im, RN Elimination: Will not experience complications related to urinary retention 07/21/2017 0011 - Progressing by Ruben Im, RN Safety: Ability to remain free from injury will improve 07/21/2017 0011 - Progressing by Ruben Im, RN Skin Integrity: Risk for impaired skin integrity will decrease 07/21/2017 0011 - Progressing by Ruben Im, RN

## 2017-07-21 NOTE — Discharge Instructions (Signed)
Heart Failure Action Plan A heart failure action plan helps you understand what to do when you have symptoms of heart failure. Follow the plan that was created by you and your health care provider. Review your plan each time you visit your health care provider. Red zone These signs and symptoms mean you should get medical help right away:  You have trouble breathing when resting.  You have a dry cough that is getting worse.  You have swelling or pain in your legs or abdomen that is getting worse.  You suddenly gain more than 2-3 lb (0.9-1.4 kg) in a day, or more than 5 lb (2.3 kg) in one week. This amount may be more or less depending on your condition.  You have trouble staying awake or you feel confused.  You have chest pain.  You do not have an appetite.  You pass out.  If you experience any of these symptoms:  Call your local emergency services (911 in the [place] right away or seek help at the emergency department of the nearest hospital.  Yellow zone These signs and symptoms mean your condition may be getting worse and you should make some changes:  You have trouble breathing when you are active or you need to sleep with extra pillows.  You have swelling in your legs or abdomen.  You gain 2-3 lb (0.9-1.4 kg) in one day, or 5 lb (2.3 kg) in one week. This amount may be more or less depending on your condition.  You get tired easily.  You have trouble sleeping.  You have a dry cough.  If you experience any of these symptoms:  Contact your health care provider within the next day.  Your health care provider may adjust your medicines.  Green zone These signs mean you are doing well and can continue what you are doing:  You do not have shortness of breath.  You have very little swelling or no new swelling.  Your weight is stable (no gain or loss).  You have a normal activity level.  You do not have chest pain or any other new symptoms.  Follow these  instructions at home:  Take over-the-counter and prescription medicines only as told by your health care provider.  Weigh yourself daily. Your target weight is __________ lb (__________ kg). ? Call your health care provider if you gain more than __________ lb (__________ kg) in a day, or more than __________ lb (__________ kg) in one week.  Eat a heart-healthy diet. Work with a diet and nutrition specialist (dietitian) to create an eating plan that is best for you.  Keep all follow-up visits as told by your health care provider. This is important. Where to find more information:  American Heart Association: www.heart.org Summary  Follow the action plan that was created by you and your health care provider.  Get help right away if you have any symptoms in the Red zone. This information is not intended to replace advice given to you by your health care provider. Make sure you discuss any questions you have with your health care provider. Document Released: 07/20/2016 Document Revised: 07/20/2016 Document Reviewed: 07/20/2016 Elsevier Interactive Patient Education  2018 Elsevier Inc.  

## 2017-07-21 NOTE — Discharge Summary (Signed)
Physician Discharge Summary  Jamie Montoya HAL:[id number] DOB: September 16, 1945 DOA: 07/17/2017  PCP: Cyndi Bender, PA-C  Admit date: 07/17/2017 Discharge date: 07/21/2017  Admitted From: Home Disposition:  Home  Recommendations for Outpatient Follow-up:  1. Follow up with PCP in 1-2 weeks 2. Please obtain BMP/CBC in one week  Home Health: No Equipment/Devices: 3LNC  Discharge Condition: Stable CODE STATUS: FULL Diet recommendation: Sodium restricted  Brief/Interim Summary:  ##) Acute on chronic diastolic heart failure exacerbation: Patient was given IV furosemide 80 mg twice a day and oral metolazone with significant diuresis.  She was continued on her home spironolactone and metoprolol.  She was given a 2 g sodium restricted diet.  She was restricted to 1.5 L of fluid daily.  An echo done on 07/18/2017 was unchanged from prior and showed grade 2 diastolic dysfunction.  ##) COPD: Patient was continued on home 3 L nasal cannula.  She was given as needed DuoNeb's as needed.  ##) History of esophageal stricture: Patient had esophageal dilatation approximately 3-4 years ago.  Patient was given outpatient follow-up with GI as she did complain of some problems with food getting caught in her throat.  Speech-language pathology also evaluated the patient.  ##) Microcytic anemia: Patient's vitamin B12 was normal.  Her TSH was normal.  Folate was normal.  ##) Hypothyroidism: Patient was continued on home levothyroxine 300 mcg.  Patient's TSH was normal.  ##) Hyperlipidemia: Patient was continued on home pravastatin 20 mg.  ##) Ulcerative colitis: She was continued on home sulfasalazine.  Discharge Diagnoses:  Active Problems:   CHF exacerbation ([place])   Acute heart failure with normal ejection fraction [facility])    Discharge Instructions  Discharge Instructions    Diet - low sodium heart healthy   Complete by:  As directed    Discharge instructions   Complete by:  As directed    Please  follow-up with your primary care doctor in 1 week after discharge.  Please double up on your diuretic dose if you notice 5 pounds of weight gain or any lower extremity swelling that is worse.   Increase activity slowly   Complete by:  As directed      Allergies as of 07/21/2017      Reactions   Tape Rash      Medication List    TAKE these medications   albuterol (2.5 MG/3ML) 0.083% nebulizer solution Commonly known as:  PROVENTIL Take 3 mLs by nebulization 4 (four) times daily as needed for wheezing or shortness of breath.   Biotin 5000 MCG Tabs Take 1 tablet by mouth at bedtime.   levothyroxine 300 MCG tablet Commonly known as:  SYNTHROID, LEVOTHROID Take 300 mcg by mouth daily before breakfast.   metolazone 2.5 MG tablet Commonly known as:  ZAROXOLYN TAKE 1 TABLET BY MOUTH  TWICE A WEEK. MAY TAKE 1  ADDITIONAL TABLET PER WEEK  FOR INCREASED SWELLING. What changed:    how much to take  how to take this  when to take this  additional instructions   metoprolol tartrate 50 MG tablet Commonly known as:  LOPRESSOR Take 0.5 tablets (25 mg total) by mouth 2 (two) times daily.   potassium chloride SA 20 MEQ tablet Commonly known as:  K-DUR,KLOR-CON TAKE 2 TABELTS BY MOUTH TWICE DAILY   pravastatin 20 MG tablet Commonly known as:  PRAVACHOL Take 20 mg by mouth at bedtime.   spironolactone 25 MG tablet Commonly known as:  ALDACTONE Take 1 tablet (25 mg total) by  mouth daily.   sulfamethoxazole-trimethoprim 800-160 MG tablet Commonly known as:  BACTRIM DS,SEPTRA DS Take 1 tablet by mouth 2 (two) times daily.   sulfaSALAzine 500 MG tablet Commonly known as:  AZULFIDINE TAKE 2 TABLETS BY MOUTH 4  TIMES DAILY   torsemide 20 MG tablet Commonly known as:  DEMADEX Take 2 tablets (40 mg total) by mouth 2 (two) times daily.       Allergies  Allergen Reactions  . Tape Rash    Consultations:  Speech-language pathology   Procedures/Studies: Dg Chest 2  View  Result Date: 07/17/2017 CLINICAL DATA:  72 year old female with shortness of breath for 1 week. Lethargy. Lower extremity swelling. EXAM: CHEST  2 VIEW COMPARISON:  08/19/2016 and earlier. FINDINGS: Stable lung volumes. Stable cardiomegaly and mediastinal contours. Calcified aortic atherosclerosis. No pneumothorax. Pulmonary vascularity appears increased diffusely and there are basilar predominant bilateral increased pulmonary interstitial markings. However, no pleural effusion identified. No consolidation. No acute osseous abnormality identified. Round metallic external artifact projecting over the diaphragm at midline is seen to be anterior and external on the lateral view. IMPRESSION: 1. Acute basilar predominant bilateral increased interstitial opacity. Differential considerations include pulmonary interstitial edema and viral/atypical respiratory infection. No pleural effusion. 2. Stable cardiomegaly. Electronically Signed   By: Genevie Ann M.D.   On: 07/17/2017 19:32    Echo 07/18/2017:  - Left ventricle: The cavity size was normal. Systolic function was   normal. The estimated ejection fraction was in the range of 55%   to 60%. Wall motion was normal; there were no regional wall   motion abnormalities. Features are consistent with a pseudonormal   left ventricular filling pattern, with concomitant abnormal   relaxation and increased filling pressure (grade 2 diastolic   dysfunction). - Mitral valve: Calcified annulus. There was mild regurgitation. - Left atrium: The atrium was mildly dilated. - Right ventricle: The cavity size was mildly dilated. Wall   thickness was normal. - Right atrium: The atrium was moderately dilated. - Tricuspid valve: There was moderate regurgitation. - Pulmonary arteries: Systolic pressure was moderately increased.   PA peak pressure: 47 mm Hg (S).  Subjective:   Discharge Exam: Vitals:   07/20/17 2100 07/21/17 0600  BP: (!) 142/49 (!) 114/39  Pulse:  75 64  Resp: 20 18  Temp: 98.1 F (36.7 C) 97.7 F (36.5 C)  SpO2: 90% 91%   Vitals:   07/20/17 0938 07/20/17 2100 07/21/17 0426 07/21/17 0600  BP: 110/70 (!) 142/49  (!) 114/39  Pulse: 74 75  64  Resp: 20 20  18   Temp: 98 F (36.7 C) 98.1 F (36.7 C)  97.7 F (36.5 C)  TempSrc: Oral Oral  Oral  SpO2: 94% 90%  91%  Weight:   132.5 kg (292 lb)   Height:        General: Pt is alert, awake, not in acute distress, nasal cannula in place Cardiovascular: RRR, distant heart sounds Respiratory: CTA bilaterally, no wheezing, no rhonchi Abdominal: Soft, NT, ND, bowel sounds + Extremities: Chronic brawny lower extremity edema, covered wound on left lower extremity    The results of significant diagnostics from this hospitalization (including imaging, microbiology, ancillary and laboratory) are listed below for reference.     Microbiology: No results found for this or any previous visit (from the past 240 hour(s)).   Labs: BNP (last 3 results) Recent Labs    07/30/16 1602 07/17/17 1758  BNP 85.2 973.5*   Basic Metabolic Panel: Recent Labs  Lab  07/17/17 1758 07/18/17 0620 07/19/17 0714 07/20/17 0613 07/21/17 0719  NA 142 142 140 141 142  K 4.1 3.9 3.7 3.6 3.1*  CL 94* 94* 92* 91* 87*  CO2 36* 40* 38* 37* 40*  GLUCOSE 70 103* 96 95 88  BUN 13 14 13 13 12   CREATININE 1.01* 1.04* 1.14* 1.01* 1.14*  CALCIUM 8.7* 8.7* 8.6* 8.4* 8.4*  MG  --   --  1.9 1.9 1.9   Liver Function Tests: Recent Labs  Lab 07/19/17 0714  AST 14*  ALT 11*  ALKPHOS 79  BILITOT 0.6  PROT 7.1  ALBUMIN 3.3*   No results for input(s): LIPASE, AMYLASE in the last 168 hours. No results for input(s): AMMONIA in the last 168 hours. CBC: Recent Labs  Lab 07/17/17 1758 07/19/17 0714 07/20/17 0613  WBC 4.8 4.0 3.2*  HGB 9.9* 9.6* 9.3*  HCT 36.3 34.7* 33.2*  MCV 105.8* 103.9* 102.5*  PLT 144* 156 154   Cardiac Enzymes: No results for input(s): CKTOTAL, CKMB, CKMBINDEX, TROPONINI in the  last 168 hours. BNP: Invalid input(s): POCBNP CBG: No results for input(s): GLUCAP in the last 168 hours. D-Dimer No results for input(s): DDIMER in the last 72 hours. Hgb A1c No results for input(s): HGBA1C in the last 72 hours. Lipid Profile No results for input(s): CHOL, HDL, LDLCALC, TRIG, CHOLHDL, LDLDIRECT in the last 72 hours. Thyroid function studies Recent Labs    07/19/17 0714  TSH 0.586   Anemia work up Recent Labs    07/19/17 0714  VITAMINB12 554   Urinalysis No results found for: COLORURINE, APPEARANCEUR, LABSPEC, [place], GLUCOSEU, HGBUR, BILIRUBINUR, KETONESUR, PROTEINUR, UROBILINOGEN, NITRITE, LEUKOCYTESUR Sepsis Labs Invalid input(s): PROCALCITONIN,  WBC,  LACTICIDVEN Microbiology No results found for this or any previous visit (from the past 240 hour(s)).   Time coordinating discharge: Over 30 minutes  SIGNED:   Cristy Folks, MD  [facility] 07/21/2017, 10:24 AM  If 7PM-7AM, please contact night-coverage [email or web address] Password [email or web address]

## 2017-07-21 NOTE — Clinical Social Work Note (Signed)
Clinical Social Work Assessment  Patient Details  Name: Jamie Montoya MRN: [id number] Date of Birth: August 22, 1945  Date of referral:  07/21/17               Reason for consult:  Transportation                Permission sought to share information with:    Permission granted to share information::  No  Name::        Agency::     Relationship::     Contact Information:     Housing/Transportation Living arrangements for the past 2 months:  Single Family Home Source of Information:  Patient, Medical Team Patient Interpreter Needed:  None Criminal Activity/Legal Involvement Pertinent to Current Situation/Hospitalization:  No - Comment as needed Significant Relationships:  Adult Children, Other Family Members(Grandson) Lives with:  Self Do you feel safe going back to the place where you live?  Yes Need for family participation in patient care:  Yes (Comment)  Care giving concerns:  Patient in need of transportation resources for [facility].   Social Worker assessment / plan:  CSW met with patient. No supports at bedside. CSW introduced role and inquired about transportation needs. Patient said her daughter used to transport her but she recently hurt her back and has been unable to do so since then. Patient knows people that use [employer] but does not like the idea of having to wait around at the doctor's office for her ride. CSW provided information on [employer] and the [employer] bus. She does not have Medicaid to qualify for Medicaid transportation. Patient's grandson will transport patient home today. No further concerns. CSW signing off as social work intervention is no longer needed.  Employment status:  Retired Nurse, adult PT Recommendations:  Not assessed at this time Information / Referral to community resources:  Other (Comment Required)(Transportation resources for [facility])  Patient/Family's Response to care:  Patient agreeable to receiving resources.  Patient's family supportive and involved in patient's care. Patient appreciated social work intervention.  Patient/Family's Understanding of and Emotional Response to Diagnosis, Current Treatment, and Prognosis:  Patient has a good understanding of the reason for admission and social work consult. Patient appears happy with hospital care.  Emotional Assessment Appearance:  Appears stated age Attitude/Demeanor/Rapport:  Engaged Affect (typically observed):  Appropriate, Calm Orientation:  Oriented to Self, Oriented to Place, Oriented to  Time, Oriented to Situation Alcohol / Substance use:  Never Used Psych involvement (Current and /or in the community):  No (Comment)  Discharge Needs  Concerns to be addressed:  Care Coordination Readmission within the last 30 days:  No Current discharge risk:  None Barriers to Discharge:  No Barriers Identified   Candie Chroman, LCSW 07/21/2017, 1:46 PM

## 2017-07-21 NOTE — Progress Notes (Signed)
Ms. Jamie Montoya expressed desire to be discharged home during my 1st round today.  Per mobility tech, OT consult placed and patient updated on plan of care.  Rehab contacted with request for an as soon as possible consult.  OT consult in process now.

## 2017-07-21 NOTE — Progress Notes (Signed)
Case Manager RN, Neoma Laming, discussed shower chair and payment requirement and patient declined shower chair, stating she does not need this.  Order for shower chair removed.

## 2017-07-21 NOTE — Evaluation (Signed)
Occupational Therapy Evaluation Patient Details Name: Jamie Montoya MRN: [id number] DOB: 05-Dec-1945 Today's Date: 07/21/2017    History of Present Illness 72 yo female admitted with CHF exacerbation on 3 L oxygen and LLE lymphedema wrap    Clinical Impression   PT admitted with CHF exacerbation. Pt currently with functional limitiations due to the deficits listed below (see OT problem list). Pt currently with access to transportation and (A) for meals. CM notified for need to have [place] and RN for LLE wraps. Pt currently requires 3L of oxygen. Pt educated in depth regarding energy conservation with handout provided.  Pt will benefit from skilled OT to increase their independence and safety with adls and balance to allow discharge [place].     Follow Up Recommendations  Home health OT    Equipment Recommendations  Other (comment)(declines 3n1 at this time)    Recommendations for Other Services Other (comment);PT consult(RN for L LE)     Precautions / Restrictions Precautions Precautions: Fall Precaution Comments: reports previous falls at home to RN staff prior to OT session      Mobility Bed Mobility Overal bed mobility: Modified Independent                Transfers                      Balance                                           ADL either performed or assessed with clinical judgement   ADL Overall ADL's : Needs assistance/impaired Eating/Feeding: Independent   Grooming: Independent       Lower Body Bathing: Min guard Lower Body Bathing Details (indicate cue type and reason): pt is only able to reach knee area but has a reacher at home. educated on use of reacher for dressing if needed to avoid bending over           Toilet Transfer Details (indicate cue type and reason): educated on water temperature for energy conservation. pt plans to sponge bath. pt provided tape in case a shower is required so she can wrap L LE. pt  reports "i have to shower if i have a bowel movement" pt reports "i just pull that wrap off"            General ADL Comments: pt expresses difficulty with obtaining food ( i just eat sandwiches) , transportation ( sometimes my grandson can take me but i cant get in and out of the car now) , and RN care for L LE. CM called to room by OT to address need for home services, transportation and food. Pt expresses desire to return home and not a SNF. pt educated that location will require use of [facility] services to help her remain independent in the community. [facility] notified by CM during visit to the room.      Vision Baseline Vision/History: Wears glasses Wears Glasses: Reading only       Perception     Praxis      Pertinent Vitals/Pain Pain Assessment: No/denies pain     Hand Dominance Right   Extremity/Trunk Assessment Upper Extremity Assessment Upper Extremity Assessment: Generalized weakness   Lower Extremity Assessment Lower Extremity Assessment: Generalized weakness   Cervical / Trunk Assessment Cervical / Trunk Assessment: Kyphotic   Communication Communication Communication: No difficulties  Cognition Arousal/Alertness: Awake/alert Behavior During Therapy: WFL for tasks assessed/performed Overall Cognitive Status: Within Functional Limits for tasks assessed                                 General Comments: pt does not remember [facility] member visiting but hands a card and says was this her? I didnt know what she wanted me to do. I wont refuse help    General Comments  L LE wrapped at this time and dry    Exercises     Shoulder Instructions      Home Living Family/patient expects to be discharged to:: Private residence Living Arrangements: Alone Available Help at Discharge: Other (Comment)(uncertain of (A). possibly grandson for part of the day) Type of Home: House Home Access: [place]: One level     Bathroom Shower/Tub:  Occupational psychologist: Standard Bathroom Accessibility: No   Home Equipment: Environmental consultant - 2 wheels;Walker - 4 wheels;Adaptive equipment Adaptive Equipment: Reacher Additional Comments: pt reports bathroom is too small to have seat in the walk in shower and the second bathroom has a tub but no shower. pt states "its useless because i cant get down in the tub to get the water on me" pt reports shower is too small for a seat. Pt uses a rollator at baseline. pt reports daughter is no longer able to help her manage so she has a caregiver that comes tuesdays to help clean and the next tuesday goes to the grocery store.pt reports grandson is not a reliable source for transportation.       Prior Functioning/Environment Level of Independence: Independent with assistive device(s)        Comments: walks with rollator. pt requires electric scooter at grocery store        OT Problem List:        OT Treatment/Interventions:      OT Goals(Current goals can be found in the care plan section) Acute Rehab OT Goals Patient Stated Goal: to go home  OT Frequency:     Barriers to D/C:            Co-evaluation              AM-PAC PT "6 Clicks" Daily Activity     Outcome Measure Help from another person eating meals?: None Help from another person taking care of personal grooming?: A Little Help from another person toileting, which includes using toliet, bedpan, or urinal?: A Little Help from another person bathing (including washing, rinsing, drying)?: A Little Help from another person to put on and taking off regular upper body clothing?: A Little Help from another person to put on and taking off regular lower body clothing?: A Little 6 Click Score: 19   End of Session Equipment Utilized During Treatment: Oxygen Nurse Communication: Mobility status  Activity Tolerance: Patient tolerated treatment well Patient left: in bed;with call bell/phone within reach  OT Visit  Diagnosis: Unsteadiness on feet (R26.81)                Time: 8938-1017 OT Time Calculation (min): 42 min Charges:  OT General Charges $OT Visit: 1 Visit OT Evaluation $OT Eval Moderate Complexity: 1 Mod OT Treatments $Self Care/Home Management : 8-22 mins G-Codes:      Jeri Modena   OTR/L Pager: [phone] Office: [phone] .   Parke Poisson B 07/21/2017, 1:03 PM

## 2017-07-21 NOTE — Plan of Care (Signed)
  Completed/[facility] Behavior/Discharge Planning: Ability to manage health-related needs will improve 07/21/2017 1105 - Completed/Met by Alonna Buckler, RN Clinical Measurements: Ability to maintain clinical measurements within normal limits will improve 07/21/2017 1105 - Completed/Met by Alonna Buckler, RN Will remain free from infection 07/21/2017 1105 - Completed/Met by Alonna Buckler, RN Diagnostic test results will improve 07/21/2017 1105 - Completed/Met by Alonna Buckler, RN Respiratory complications will improve 07/21/2017 1105 - Completed/Met by Alonna Buckler, RN Cardiovascular complication will be avoided 07/21/2017 1105 - Completed/Met by Alonna Buckler, RN Activity: Risk for activity intolerance will decrease 07/21/2017 1105 - Completed/Met by Alonna Buckler, RN Coping: Level of anxiety will decrease 07/21/2017 1105 - Completed/Met by Alonna Buckler, RN Elimination: Will not experience complications related to urinary retention 07/21/2017 1105 - Completed/Met by Alonna Buckler, RN Safety: Ability to remain free from injury will improve 07/21/2017 1105 - Completed/Met by Alonna Buckler, RN Skin Integrity: Risk for impaired skin integrity will decrease 07/21/2017 1105 - Completed/Met by Alonna Buckler, RN

## 2017-07-21 NOTE — Care Management Note (Addendum)
Case Management Note  Patient Details  Name: Jamie Montoya MRN: [id number] Date of Birth: [birth year]  Subjective/Objective:   From home, she has PCP and medication coverage. Patient states she has had [place] with [facility] to wrap her leg and would like to continue this.  Also OT has rec HHOT and shower chair.  Patient has declined shower chair since insurance will not cover it.  NCM informed her she can get one from [facility] if she still would like one.  NCM contacted patient's grandson and he will be here to transport her home. Also CSW will give patient transportation resources  For [place]. NCM contacted [facility] and faxed referral to them for [facility] [place].  Soc will begin 24-48 hrs post dc.  [facility] will also be following up with patient.                     Action/Plan: DC home with [facility] [place] with [facility].   Expected Discharge Date:  07/21/17               Expected Discharge Plan:  [place]  In-House Referral:     Discharge planning Services  CM Consult  Post Acute Care Choice:  Home Health Choice offered to:  Patient  DME Arranged:    DME Agency:     HH Arranged:  RN, OT HH Agency:  [place] [facility]  Status of Service:  Completed, signed off  If discussed at [place] of Stay Meetings, dates discussed:    Additional Comments:  Zenon Mayo, RN 07/21/2017, 12:27 PM

## 2017-07-22 DIAGNOSIS — J9610 Chronic respiratory failure, unspecified whether with hypoxia or hypercapnia: Secondary | ICD-10-CM | POA: Diagnosis not present

## 2017-07-22 DIAGNOSIS — Z602 Problems related to living alone: Secondary | ICD-10-CM | POA: Diagnosis not present

## 2017-07-22 DIAGNOSIS — I5040 Unspecified combined systolic (congestive) and diastolic (congestive) heart failure: Secondary | ICD-10-CM | POA: Diagnosis not present

## 2017-07-22 DIAGNOSIS — I5033 Acute on chronic diastolic (congestive) heart failure: Secondary | ICD-10-CM | POA: Diagnosis not present

## 2017-07-22 DIAGNOSIS — I890 Lymphedema, not elsewhere classified: Secondary | ICD-10-CM | POA: Diagnosis not present

## 2017-07-22 DIAGNOSIS — Z9981 Dependence on supplemental oxygen: Secondary | ICD-10-CM | POA: Diagnosis not present

## 2017-07-22 DIAGNOSIS — J449 Chronic obstructive pulmonary disease, unspecified: Secondary | ICD-10-CM | POA: Diagnosis not present

## 2017-07-22 DIAGNOSIS — I110 Hypertensive heart disease with heart failure: Secondary | ICD-10-CM | POA: Diagnosis not present

## 2017-07-22 DIAGNOSIS — I87393 Chronic venous hypertension (idiopathic) with other complications of bilateral lower extremity: Secondary | ICD-10-CM | POA: Diagnosis not present

## 2017-07-22 DIAGNOSIS — Z9119 Patient's noncompliance with other medical treatment and regimen: Secondary | ICD-10-CM | POA: Diagnosis not present

## 2017-07-22 DIAGNOSIS — J441 Chronic obstructive pulmonary disease with (acute) exacerbation: Secondary | ICD-10-CM | POA: Diagnosis not present

## 2017-07-22 DIAGNOSIS — Z87891 Personal history of nicotine dependence: Secondary | ICD-10-CM | POA: Diagnosis not present

## 2017-07-23 ENCOUNTER — Other Ambulatory Visit: Payer: Self-pay | Admitting: *Deleted

## 2017-07-23 NOTE — Patient Outreach (Signed)
[facility]) Care Management  07/23/2017  Jamie Montoya April 06, 1946 [id number]   EMMI- General Discharge RED ON EMMI ALERT DAY#: 1 DATE: 07/22/17 RED ALERT: Read discharge papers? No Know who to call abut changes in condition? No Transportation to follow-up? No Wounds healing well? No  Outreach attempt #1 to patient. Patient unavailable. She requested for a return telephone call. Patient stated, she was attempting to get her oxygen level back up.  Updated: Outgoing telephone call to Summer, RN at Dr. Nathen May office to inform her about patient's condition. Summer stated, she will contact patient and follow-up about her symptoms.  Plan: RN CM will contact patient within one business day.  [name], RN, BSN, MHA/MSL, [place] Telephonic Care Manager Coordinator [facility] Direct Phone: [phone] Toll Free: [phone] Fax: [phone]

## 2017-07-24 ENCOUNTER — Ambulatory Visit: Payer: Self-pay | Admitting: *Deleted

## 2017-07-24 ENCOUNTER — Encounter: Payer: Self-pay | Admitting: *Deleted

## 2017-07-24 ENCOUNTER — Other Ambulatory Visit: Payer: Self-pay | Admitting: *Deleted

## 2017-07-24 NOTE — Patient Outreach (Addendum)
[facility]) Care Management  07/24/2017  CHRISTIANN HAGERTY 11-25-1945 [id number]  EMMI- General Discharge RED ON EMMI ALERT DAY#: 1 DATE: 07/22/17 RED ALERT: Read discharge papers? No Know who to call abut changes in condition? No Transportation to follow-up? No Wounds healing well? No   Outreach attempt #2 to patient.  No answer. RN CM left HIPAA compliant message along with contact info.   Plan: RN CM will send unsuccessful outreach letter to patient and will close case if no response from patient within 10 business days.  [name], RN, BSN, MHA/MSL, [place] Telephonic Care Manager Coordinator [facility] Direct Phone: [phone] Cell Phone: [phone] Toll Free: [phone] Fax: [phone]

## 2017-07-26 DIAGNOSIS — I5030 Unspecified diastolic (congestive) heart failure: Secondary | ICD-10-CM | POA: Diagnosis not present

## 2017-07-29 DIAGNOSIS — J449 Chronic obstructive pulmonary disease, unspecified: Secondary | ICD-10-CM | POA: Diagnosis not present

## 2017-07-29 DIAGNOSIS — J9610 Chronic respiratory failure, unspecified whether with hypoxia or hypercapnia: Secondary | ICD-10-CM | POA: Diagnosis not present

## 2017-07-29 DIAGNOSIS — Z602 Problems related to living alone: Secondary | ICD-10-CM | POA: Diagnosis not present

## 2017-07-29 DIAGNOSIS — I890 Lymphedema, not elsewhere classified: Secondary | ICD-10-CM | POA: Diagnosis not present

## 2017-07-29 DIAGNOSIS — I5033 Acute on chronic diastolic (congestive) heart failure: Secondary | ICD-10-CM | POA: Diagnosis not present

## 2017-07-29 DIAGNOSIS — J441 Chronic obstructive pulmonary disease with (acute) exacerbation: Secondary | ICD-10-CM | POA: Diagnosis not present

## 2017-07-29 DIAGNOSIS — Z9981 Dependence on supplemental oxygen: Secondary | ICD-10-CM | POA: Diagnosis not present

## 2017-07-29 DIAGNOSIS — Z9119 Patient's noncompliance with other medical treatment and regimen: Secondary | ICD-10-CM | POA: Diagnosis not present

## 2017-07-29 DIAGNOSIS — I87393 Chronic venous hypertension (idiopathic) with other complications of bilateral lower extremity: Secondary | ICD-10-CM | POA: Diagnosis not present

## 2017-07-29 DIAGNOSIS — I110 Hypertensive heart disease with heart failure: Secondary | ICD-10-CM | POA: Diagnosis not present

## 2017-07-29 DIAGNOSIS — Z87891 Personal history of nicotine dependence: Secondary | ICD-10-CM | POA: Diagnosis not present

## 2017-07-29 DIAGNOSIS — I5040 Unspecified combined systolic (congestive) and diastolic (congestive) heart failure: Secondary | ICD-10-CM | POA: Diagnosis not present

## 2017-07-31 DIAGNOSIS — Z87891 Personal history of nicotine dependence: Secondary | ICD-10-CM | POA: Diagnosis not present

## 2017-07-31 DIAGNOSIS — Z9119 Patient's noncompliance with other medical treatment and regimen: Secondary | ICD-10-CM | POA: Diagnosis not present

## 2017-07-31 DIAGNOSIS — I5033 Acute on chronic diastolic (congestive) heart failure: Secondary | ICD-10-CM | POA: Diagnosis not present

## 2017-07-31 DIAGNOSIS — I5040 Unspecified combined systolic (congestive) and diastolic (congestive) heart failure: Secondary | ICD-10-CM | POA: Diagnosis not present

## 2017-07-31 DIAGNOSIS — Z9981 Dependence on supplemental oxygen: Secondary | ICD-10-CM | POA: Diagnosis not present

## 2017-07-31 DIAGNOSIS — J9610 Chronic respiratory failure, unspecified whether with hypoxia or hypercapnia: Secondary | ICD-10-CM | POA: Diagnosis not present

## 2017-07-31 DIAGNOSIS — I87393 Chronic venous hypertension (idiopathic) with other complications of bilateral lower extremity: Secondary | ICD-10-CM | POA: Diagnosis not present

## 2017-07-31 DIAGNOSIS — I110 Hypertensive heart disease with heart failure: Secondary | ICD-10-CM | POA: Diagnosis not present

## 2017-07-31 DIAGNOSIS — J449 Chronic obstructive pulmonary disease, unspecified: Secondary | ICD-10-CM | POA: Diagnosis not present

## 2017-07-31 DIAGNOSIS — J441 Chronic obstructive pulmonary disease with (acute) exacerbation: Secondary | ICD-10-CM | POA: Diagnosis not present

## 2017-07-31 DIAGNOSIS — Z602 Problems related to living alone: Secondary | ICD-10-CM | POA: Diagnosis not present

## 2017-07-31 DIAGNOSIS — I890 Lymphedema, not elsewhere classified: Secondary | ICD-10-CM | POA: Diagnosis not present

## 2017-08-01 ENCOUNTER — Telehealth: Payer: Self-pay | Admitting: Cardiology

## 2017-08-01 NOTE — Telephone Encounter (Signed)
Whitney RN Case Manager at [facility] is calling to get a diagnosis for Jamie Montoya  For the Heart Program . Please call at [phone]

## 2017-08-05 NOTE — Telephone Encounter (Signed)
VM left for [facility] rep. Need EF% form faxed over.

## 2017-08-07 ENCOUNTER — Ambulatory Visit: Payer: Self-pay | Admitting: *Deleted

## 2017-08-07 ENCOUNTER — Other Ambulatory Visit: Payer: Self-pay | Admitting: *Deleted

## 2017-08-07 DIAGNOSIS — Z9119 Patient's noncompliance with other medical treatment and regimen: Secondary | ICD-10-CM | POA: Diagnosis not present

## 2017-08-07 DIAGNOSIS — J441 Chronic obstructive pulmonary disease with (acute) exacerbation: Secondary | ICD-10-CM | POA: Diagnosis not present

## 2017-08-07 DIAGNOSIS — Z602 Problems related to living alone: Secondary | ICD-10-CM | POA: Diagnosis not present

## 2017-08-07 DIAGNOSIS — I890 Lymphedema, not elsewhere classified: Secondary | ICD-10-CM | POA: Diagnosis not present

## 2017-08-07 DIAGNOSIS — I110 Hypertensive heart disease with heart failure: Secondary | ICD-10-CM | POA: Diagnosis not present

## 2017-08-07 DIAGNOSIS — I5033 Acute on chronic diastolic (congestive) heart failure: Secondary | ICD-10-CM | POA: Diagnosis not present

## 2017-08-07 DIAGNOSIS — Z9981 Dependence on supplemental oxygen: Secondary | ICD-10-CM | POA: Diagnosis not present

## 2017-08-07 DIAGNOSIS — J449 Chronic obstructive pulmonary disease, unspecified: Secondary | ICD-10-CM | POA: Diagnosis not present

## 2017-08-07 DIAGNOSIS — I5040 Unspecified combined systolic (congestive) and diastolic (congestive) heart failure: Secondary | ICD-10-CM | POA: Diagnosis not present

## 2017-08-07 DIAGNOSIS — I87393 Chronic venous hypertension (idiopathic) with other complications of bilateral lower extremity: Secondary | ICD-10-CM | POA: Diagnosis not present

## 2017-08-07 DIAGNOSIS — J9610 Chronic respiratory failure, unspecified whether with hypoxia or hypercapnia: Secondary | ICD-10-CM | POA: Diagnosis not present

## 2017-08-07 DIAGNOSIS — Z87891 Personal history of nicotine dependence: Secondary | ICD-10-CM | POA: Diagnosis not present

## 2017-08-07 NOTE — Patient Outreach (Signed)
[facility]) Care Management  08/07/2017  Jamie Montoya 11-10-1945 [id number]   Case Closure:  RN CM sent unsuccessful outreach letter to patient on 07/24/17. RN CM will close case due to no response from patient within 10 business days.  Plan:  RN CM will notify [facility] CM administration assistant regarding case closure.    [name], RN, BSN, MHA/MSL, [place] Telephonic Care Manager Coordinator [facility] Direct Phone: [phone] Cell Phone: [phone] Toll Free: [phone] Fax: [phone]

## 2017-08-14 DIAGNOSIS — I890 Lymphedema, not elsewhere classified: Secondary | ICD-10-CM | POA: Diagnosis not present

## 2017-08-14 DIAGNOSIS — Z9981 Dependence on supplemental oxygen: Secondary | ICD-10-CM | POA: Diagnosis not present

## 2017-08-14 DIAGNOSIS — I5033 Acute on chronic diastolic (congestive) heart failure: Secondary | ICD-10-CM | POA: Diagnosis not present

## 2017-08-14 DIAGNOSIS — J441 Chronic obstructive pulmonary disease with (acute) exacerbation: Secondary | ICD-10-CM | POA: Diagnosis not present

## 2017-08-14 DIAGNOSIS — Z602 Problems related to living alone: Secondary | ICD-10-CM | POA: Diagnosis not present

## 2017-08-14 DIAGNOSIS — J449 Chronic obstructive pulmonary disease, unspecified: Secondary | ICD-10-CM | POA: Diagnosis not present

## 2017-08-14 DIAGNOSIS — I5040 Unspecified combined systolic (congestive) and diastolic (congestive) heart failure: Secondary | ICD-10-CM | POA: Diagnosis not present

## 2017-08-14 DIAGNOSIS — Z9119 Patient's noncompliance with other medical treatment and regimen: Secondary | ICD-10-CM | POA: Diagnosis not present

## 2017-08-14 DIAGNOSIS — I110 Hypertensive heart disease with heart failure: Secondary | ICD-10-CM | POA: Diagnosis not present

## 2017-08-14 DIAGNOSIS — J9610 Chronic respiratory failure, unspecified whether with hypoxia or hypercapnia: Secondary | ICD-10-CM | POA: Diagnosis not present

## 2017-08-14 DIAGNOSIS — Z87891 Personal history of nicotine dependence: Secondary | ICD-10-CM | POA: Diagnosis not present

## 2017-08-14 DIAGNOSIS — I87393 Chronic venous hypertension (idiopathic) with other complications of bilateral lower extremity: Secondary | ICD-10-CM | POA: Diagnosis not present

## 2017-08-18 ENCOUNTER — Other Ambulatory Visit: Payer: Self-pay | Admitting: Internal Medicine

## 2017-08-20 DIAGNOSIS — I87303 Chronic venous hypertension (idiopathic) without complications of bilateral lower extremity: Secondary | ICD-10-CM | POA: Diagnosis not present

## 2017-08-20 DIAGNOSIS — I5033 Acute on chronic diastolic (congestive) heart failure: Secondary | ICD-10-CM | POA: Diagnosis not present

## 2017-08-20 DIAGNOSIS — J449 Chronic obstructive pulmonary disease, unspecified: Secondary | ICD-10-CM | POA: Diagnosis not present

## 2017-08-20 DIAGNOSIS — D509 Iron deficiency anemia, unspecified: Secondary | ICD-10-CM | POA: Diagnosis not present

## 2017-08-21 DIAGNOSIS — I5040 Unspecified combined systolic (congestive) and diastolic (congestive) heart failure: Secondary | ICD-10-CM | POA: Diagnosis not present

## 2017-08-21 DIAGNOSIS — Z9119 Patient's noncompliance with other medical treatment and regimen: Secondary | ICD-10-CM | POA: Diagnosis not present

## 2017-08-21 DIAGNOSIS — I5033 Acute on chronic diastolic (congestive) heart failure: Secondary | ICD-10-CM | POA: Diagnosis not present

## 2017-08-21 DIAGNOSIS — I890 Lymphedema, not elsewhere classified: Secondary | ICD-10-CM | POA: Diagnosis not present

## 2017-08-21 DIAGNOSIS — J9610 Chronic respiratory failure, unspecified whether with hypoxia or hypercapnia: Secondary | ICD-10-CM | POA: Diagnosis not present

## 2017-08-21 DIAGNOSIS — I110 Hypertensive heart disease with heart failure: Secondary | ICD-10-CM | POA: Diagnosis not present

## 2017-08-21 DIAGNOSIS — Z9981 Dependence on supplemental oxygen: Secondary | ICD-10-CM | POA: Diagnosis not present

## 2017-08-21 DIAGNOSIS — J449 Chronic obstructive pulmonary disease, unspecified: Secondary | ICD-10-CM | POA: Diagnosis not present

## 2017-08-21 DIAGNOSIS — J441 Chronic obstructive pulmonary disease with (acute) exacerbation: Secondary | ICD-10-CM | POA: Diagnosis not present

## 2017-08-21 DIAGNOSIS — I87393 Chronic venous hypertension (idiopathic) with other complications of bilateral lower extremity: Secondary | ICD-10-CM | POA: Diagnosis not present

## 2017-08-21 DIAGNOSIS — Z602 Problems related to living alone: Secondary | ICD-10-CM | POA: Diagnosis not present

## 2017-08-21 DIAGNOSIS — Z87891 Personal history of nicotine dependence: Secondary | ICD-10-CM | POA: Diagnosis not present

## 2017-08-23 DIAGNOSIS — I5030 Unspecified diastolic (congestive) heart failure: Secondary | ICD-10-CM | POA: Diagnosis not present

## 2017-08-28 DIAGNOSIS — I5033 Acute on chronic diastolic (congestive) heart failure: Secondary | ICD-10-CM | POA: Diagnosis not present

## 2017-08-28 DIAGNOSIS — I87393 Chronic venous hypertension (idiopathic) with other complications of bilateral lower extremity: Secondary | ICD-10-CM | POA: Diagnosis not present

## 2017-08-28 DIAGNOSIS — Z9119 Patient's noncompliance with other medical treatment and regimen: Secondary | ICD-10-CM | POA: Diagnosis not present

## 2017-08-28 DIAGNOSIS — Z87891 Personal history of nicotine dependence: Secondary | ICD-10-CM | POA: Diagnosis not present

## 2017-08-28 DIAGNOSIS — Z9981 Dependence on supplemental oxygen: Secondary | ICD-10-CM | POA: Diagnosis not present

## 2017-08-28 DIAGNOSIS — I110 Hypertensive heart disease with heart failure: Secondary | ICD-10-CM | POA: Diagnosis not present

## 2017-08-28 DIAGNOSIS — I890 Lymphedema, not elsewhere classified: Secondary | ICD-10-CM | POA: Diagnosis not present

## 2017-08-28 DIAGNOSIS — Z602 Problems related to living alone: Secondary | ICD-10-CM | POA: Diagnosis not present

## 2017-08-28 DIAGNOSIS — J441 Chronic obstructive pulmonary disease with (acute) exacerbation: Secondary | ICD-10-CM | POA: Diagnosis not present

## 2017-08-28 DIAGNOSIS — J9610 Chronic respiratory failure, unspecified whether with hypoxia or hypercapnia: Secondary | ICD-10-CM | POA: Diagnosis not present

## 2017-09-04 DIAGNOSIS — Z9119 Patient's noncompliance with other medical treatment and regimen: Secondary | ICD-10-CM | POA: Diagnosis not present

## 2017-09-04 DIAGNOSIS — I5033 Acute on chronic diastolic (congestive) heart failure: Secondary | ICD-10-CM | POA: Diagnosis not present

## 2017-09-04 DIAGNOSIS — J9610 Chronic respiratory failure, unspecified whether with hypoxia or hypercapnia: Secondary | ICD-10-CM | POA: Diagnosis not present

## 2017-09-04 DIAGNOSIS — J441 Chronic obstructive pulmonary disease with (acute) exacerbation: Secondary | ICD-10-CM | POA: Diagnosis not present

## 2017-09-04 DIAGNOSIS — I890 Lymphedema, not elsewhere classified: Secondary | ICD-10-CM | POA: Diagnosis not present

## 2017-09-04 DIAGNOSIS — Z9981 Dependence on supplemental oxygen: Secondary | ICD-10-CM | POA: Diagnosis not present

## 2017-09-04 DIAGNOSIS — I110 Hypertensive heart disease with heart failure: Secondary | ICD-10-CM | POA: Diagnosis not present

## 2017-09-04 DIAGNOSIS — Z602 Problems related to living alone: Secondary | ICD-10-CM | POA: Diagnosis not present

## 2017-09-04 DIAGNOSIS — Z87891 Personal history of nicotine dependence: Secondary | ICD-10-CM | POA: Diagnosis not present

## 2017-09-04 DIAGNOSIS — I87393 Chronic venous hypertension (idiopathic) with other complications of bilateral lower extremity: Secondary | ICD-10-CM | POA: Diagnosis not present

## 2017-09-11 DIAGNOSIS — I890 Lymphedema, not elsewhere classified: Secondary | ICD-10-CM | POA: Diagnosis not present

## 2017-09-11 DIAGNOSIS — I5033 Acute on chronic diastolic (congestive) heart failure: Secondary | ICD-10-CM | POA: Diagnosis not present

## 2017-09-11 DIAGNOSIS — Z602 Problems related to living alone: Secondary | ICD-10-CM | POA: Diagnosis not present

## 2017-09-11 DIAGNOSIS — Z9119 Patient's noncompliance with other medical treatment and regimen: Secondary | ICD-10-CM | POA: Diagnosis not present

## 2017-09-11 DIAGNOSIS — Z9981 Dependence on supplemental oxygen: Secondary | ICD-10-CM | POA: Diagnosis not present

## 2017-09-11 DIAGNOSIS — I110 Hypertensive heart disease with heart failure: Secondary | ICD-10-CM | POA: Diagnosis not present

## 2017-09-11 DIAGNOSIS — J9610 Chronic respiratory failure, unspecified whether with hypoxia or hypercapnia: Secondary | ICD-10-CM | POA: Diagnosis not present

## 2017-09-11 DIAGNOSIS — J441 Chronic obstructive pulmonary disease with (acute) exacerbation: Secondary | ICD-10-CM | POA: Diagnosis not present

## 2017-09-11 DIAGNOSIS — Z87891 Personal history of nicotine dependence: Secondary | ICD-10-CM | POA: Diagnosis not present

## 2017-09-11 DIAGNOSIS — I87393 Chronic venous hypertension (idiopathic) with other complications of bilateral lower extremity: Secondary | ICD-10-CM | POA: Diagnosis not present

## 2017-09-18 DIAGNOSIS — Z602 Problems related to living alone: Secondary | ICD-10-CM | POA: Diagnosis not present

## 2017-09-18 DIAGNOSIS — I890 Lymphedema, not elsewhere classified: Secondary | ICD-10-CM | POA: Diagnosis not present

## 2017-09-18 DIAGNOSIS — I87393 Chronic venous hypertension (idiopathic) with other complications of bilateral lower extremity: Secondary | ICD-10-CM | POA: Diagnosis not present

## 2017-09-18 DIAGNOSIS — I110 Hypertensive heart disease with heart failure: Secondary | ICD-10-CM | POA: Diagnosis not present

## 2017-09-18 DIAGNOSIS — Z87891 Personal history of nicotine dependence: Secondary | ICD-10-CM | POA: Diagnosis not present

## 2017-09-18 DIAGNOSIS — Z9981 Dependence on supplemental oxygen: Secondary | ICD-10-CM | POA: Diagnosis not present

## 2017-09-18 DIAGNOSIS — J441 Chronic obstructive pulmonary disease with (acute) exacerbation: Secondary | ICD-10-CM | POA: Diagnosis not present

## 2017-09-18 DIAGNOSIS — I5033 Acute on chronic diastolic (congestive) heart failure: Secondary | ICD-10-CM | POA: Diagnosis not present

## 2017-09-18 DIAGNOSIS — Z9119 Patient's noncompliance with other medical treatment and regimen: Secondary | ICD-10-CM | POA: Diagnosis not present

## 2017-09-18 DIAGNOSIS — J9610 Chronic respiratory failure, unspecified whether with hypoxia or hypercapnia: Secondary | ICD-10-CM | POA: Diagnosis not present

## 2017-09-23 DIAGNOSIS — I5030 Unspecified diastolic (congestive) heart failure: Secondary | ICD-10-CM | POA: Diagnosis not present

## 2017-09-25 DIAGNOSIS — S81802A Unspecified open wound, left lower leg, initial encounter: Secondary | ICD-10-CM | POA: Diagnosis not present

## 2017-09-25 DIAGNOSIS — Z602 Problems related to living alone: Secondary | ICD-10-CM | POA: Diagnosis not present

## 2017-09-25 DIAGNOSIS — I5033 Acute on chronic diastolic (congestive) heart failure: Secondary | ICD-10-CM | POA: Diagnosis not present

## 2017-09-25 DIAGNOSIS — I890 Lymphedema, not elsewhere classified: Secondary | ICD-10-CM | POA: Diagnosis not present

## 2017-09-25 DIAGNOSIS — J441 Chronic obstructive pulmonary disease with (acute) exacerbation: Secondary | ICD-10-CM | POA: Diagnosis not present

## 2017-09-25 DIAGNOSIS — I87393 Chronic venous hypertension (idiopathic) with other complications of bilateral lower extremity: Secondary | ICD-10-CM | POA: Diagnosis not present

## 2017-09-25 DIAGNOSIS — Z87891 Personal history of nicotine dependence: Secondary | ICD-10-CM | POA: Diagnosis not present

## 2017-09-25 DIAGNOSIS — J9610 Chronic respiratory failure, unspecified whether with hypoxia or hypercapnia: Secondary | ICD-10-CM | POA: Diagnosis not present

## 2017-09-25 DIAGNOSIS — Z9981 Dependence on supplemental oxygen: Secondary | ICD-10-CM | POA: Diagnosis not present

## 2017-09-25 DIAGNOSIS — Z9119 Patient's noncompliance with other medical treatment and regimen: Secondary | ICD-10-CM | POA: Diagnosis not present

## 2017-09-25 DIAGNOSIS — I110 Hypertensive heart disease with heart failure: Secondary | ICD-10-CM | POA: Diagnosis not present

## 2017-10-02 DIAGNOSIS — I890 Lymphedema, not elsewhere classified: Secondary | ICD-10-CM | POA: Diagnosis not present

## 2017-10-02 DIAGNOSIS — I5033 Acute on chronic diastolic (congestive) heart failure: Secondary | ICD-10-CM | POA: Diagnosis not present

## 2017-10-02 DIAGNOSIS — J9610 Chronic respiratory failure, unspecified whether with hypoxia or hypercapnia: Secondary | ICD-10-CM | POA: Diagnosis not present

## 2017-10-02 DIAGNOSIS — Z602 Problems related to living alone: Secondary | ICD-10-CM | POA: Diagnosis not present

## 2017-10-02 DIAGNOSIS — Z9119 Patient's noncompliance with other medical treatment and regimen: Secondary | ICD-10-CM | POA: Diagnosis not present

## 2017-10-02 DIAGNOSIS — I110 Hypertensive heart disease with heart failure: Secondary | ICD-10-CM | POA: Diagnosis not present

## 2017-10-02 DIAGNOSIS — I87393 Chronic venous hypertension (idiopathic) with other complications of bilateral lower extremity: Secondary | ICD-10-CM | POA: Diagnosis not present

## 2017-10-02 DIAGNOSIS — S81802A Unspecified open wound, left lower leg, initial encounter: Secondary | ICD-10-CM | POA: Diagnosis not present

## 2017-10-02 DIAGNOSIS — Z9981 Dependence on supplemental oxygen: Secondary | ICD-10-CM | POA: Diagnosis not present

## 2017-10-02 DIAGNOSIS — Z87891 Personal history of nicotine dependence: Secondary | ICD-10-CM | POA: Diagnosis not present

## 2017-10-02 DIAGNOSIS — J441 Chronic obstructive pulmonary disease with (acute) exacerbation: Secondary | ICD-10-CM | POA: Diagnosis not present

## 2017-10-09 DIAGNOSIS — J441 Chronic obstructive pulmonary disease with (acute) exacerbation: Secondary | ICD-10-CM | POA: Diagnosis not present

## 2017-10-09 DIAGNOSIS — I5033 Acute on chronic diastolic (congestive) heart failure: Secondary | ICD-10-CM | POA: Diagnosis not present

## 2017-10-09 DIAGNOSIS — J9610 Chronic respiratory failure, unspecified whether with hypoxia or hypercapnia: Secondary | ICD-10-CM | POA: Diagnosis not present

## 2017-10-09 DIAGNOSIS — Z9119 Patient's noncompliance with other medical treatment and regimen: Secondary | ICD-10-CM | POA: Diagnosis not present

## 2017-10-09 DIAGNOSIS — Z602 Problems related to living alone: Secondary | ICD-10-CM | POA: Diagnosis not present

## 2017-10-09 DIAGNOSIS — Z9981 Dependence on supplemental oxygen: Secondary | ICD-10-CM | POA: Diagnosis not present

## 2017-10-09 DIAGNOSIS — Z87891 Personal history of nicotine dependence: Secondary | ICD-10-CM | POA: Diagnosis not present

## 2017-10-09 DIAGNOSIS — S81802A Unspecified open wound, left lower leg, initial encounter: Secondary | ICD-10-CM | POA: Diagnosis not present

## 2017-10-09 DIAGNOSIS — I87393 Chronic venous hypertension (idiopathic) with other complications of bilateral lower extremity: Secondary | ICD-10-CM | POA: Diagnosis not present

## 2017-10-09 DIAGNOSIS — I110 Hypertensive heart disease with heart failure: Secondary | ICD-10-CM | POA: Diagnosis not present

## 2017-10-09 DIAGNOSIS — I890 Lymphedema, not elsewhere classified: Secondary | ICD-10-CM | POA: Diagnosis not present

## 2017-10-14 DIAGNOSIS — I5033 Acute on chronic diastolic (congestive) heart failure: Secondary | ICD-10-CM | POA: Diagnosis not present

## 2017-10-14 DIAGNOSIS — I87393 Chronic venous hypertension (idiopathic) with other complications of bilateral lower extremity: Secondary | ICD-10-CM | POA: Diagnosis not present

## 2017-10-14 DIAGNOSIS — S81802A Unspecified open wound, left lower leg, initial encounter: Secondary | ICD-10-CM | POA: Diagnosis not present

## 2017-10-14 DIAGNOSIS — I110 Hypertensive heart disease with heart failure: Secondary | ICD-10-CM | POA: Diagnosis not present

## 2017-10-14 DIAGNOSIS — J441 Chronic obstructive pulmonary disease with (acute) exacerbation: Secondary | ICD-10-CM | POA: Diagnosis not present

## 2017-10-14 DIAGNOSIS — Z602 Problems related to living alone: Secondary | ICD-10-CM | POA: Diagnosis not present

## 2017-10-14 DIAGNOSIS — J9610 Chronic respiratory failure, unspecified whether with hypoxia or hypercapnia: Secondary | ICD-10-CM | POA: Diagnosis not present

## 2017-10-14 DIAGNOSIS — Z9119 Patient's noncompliance with other medical treatment and regimen: Secondary | ICD-10-CM | POA: Diagnosis not present

## 2017-10-14 DIAGNOSIS — Z87891 Personal history of nicotine dependence: Secondary | ICD-10-CM | POA: Diagnosis not present

## 2017-10-14 DIAGNOSIS — Z9981 Dependence on supplemental oxygen: Secondary | ICD-10-CM | POA: Diagnosis not present

## 2017-10-14 DIAGNOSIS — I890 Lymphedema, not elsewhere classified: Secondary | ICD-10-CM | POA: Diagnosis not present

## 2017-10-16 DIAGNOSIS — Z602 Problems related to living alone: Secondary | ICD-10-CM | POA: Diagnosis not present

## 2017-10-16 DIAGNOSIS — I890 Lymphedema, not elsewhere classified: Secondary | ICD-10-CM | POA: Diagnosis not present

## 2017-10-16 DIAGNOSIS — Z87891 Personal history of nicotine dependence: Secondary | ICD-10-CM | POA: Diagnosis not present

## 2017-10-16 DIAGNOSIS — I110 Hypertensive heart disease with heart failure: Secondary | ICD-10-CM | POA: Diagnosis not present

## 2017-10-16 DIAGNOSIS — J9610 Chronic respiratory failure, unspecified whether with hypoxia or hypercapnia: Secondary | ICD-10-CM | POA: Diagnosis not present

## 2017-10-16 DIAGNOSIS — I87393 Chronic venous hypertension (idiopathic) with other complications of bilateral lower extremity: Secondary | ICD-10-CM | POA: Diagnosis not present

## 2017-10-16 DIAGNOSIS — Z9119 Patient's noncompliance with other medical treatment and regimen: Secondary | ICD-10-CM | POA: Diagnosis not present

## 2017-10-16 DIAGNOSIS — J441 Chronic obstructive pulmonary disease with (acute) exacerbation: Secondary | ICD-10-CM | POA: Diagnosis not present

## 2017-10-16 DIAGNOSIS — Z9981 Dependence on supplemental oxygen: Secondary | ICD-10-CM | POA: Diagnosis not present

## 2017-10-16 DIAGNOSIS — I5033 Acute on chronic diastolic (congestive) heart failure: Secondary | ICD-10-CM | POA: Diagnosis not present

## 2017-10-16 DIAGNOSIS — S81802A Unspecified open wound, left lower leg, initial encounter: Secondary | ICD-10-CM | POA: Diagnosis not present

## 2017-10-23 DIAGNOSIS — I5030 Unspecified diastolic (congestive) heart failure: Secondary | ICD-10-CM | POA: Diagnosis not present

## 2017-10-29 DIAGNOSIS — J449 Chronic obstructive pulmonary disease, unspecified: Secondary | ICD-10-CM | POA: Diagnosis not present

## 2017-10-29 DIAGNOSIS — G4733 Obstructive sleep apnea (adult) (pediatric): Secondary | ICD-10-CM | POA: Diagnosis not present

## 2017-10-29 DIAGNOSIS — I5030 Unspecified diastolic (congestive) heart failure: Secondary | ICD-10-CM | POA: Diagnosis not present

## 2017-10-29 DIAGNOSIS — I82499 Acute embolism and thrombosis of other specified deep vein of unspecified lower extremity: Secondary | ICD-10-CM | POA: Diagnosis not present

## 2017-11-10 ENCOUNTER — Other Ambulatory Visit: Payer: Self-pay | Admitting: Physician Assistant

## 2017-11-10 ENCOUNTER — Other Ambulatory Visit: Payer: Self-pay | Admitting: Internal Medicine

## 2017-11-10 NOTE — Telephone Encounter (Signed)
Spoke with patient and made appointment for her 01/13/18 with Dr Carlean Purl.

## 2017-11-23 DIAGNOSIS — I5030 Unspecified diastolic (congestive) heart failure: Secondary | ICD-10-CM | POA: Diagnosis not present

## 2017-12-21 ENCOUNTER — Other Ambulatory Visit: Payer: Self-pay | Admitting: Cardiology

## 2017-12-23 ENCOUNTER — Telehealth: Payer: Self-pay | Admitting: Cardiology

## 2017-12-23 DIAGNOSIS — I5030 Unspecified diastolic (congestive) heart failure: Secondary | ICD-10-CM | POA: Diagnosis not present

## 2017-12-23 NOTE — Telephone Encounter (Signed)
New Message:      [employer] is calling to give an update on pt. She states the pt is wanting a home health RN to come and wrap her weeping legs. Pt states to [facility]  Rep that she has a cough and she is getting SOB on Exertion. Jessi states the pt is not in compliance with the program at this time due to her not using her scale and everything else they use to track the pt. She would like for the Dr to place an order for a home care nurse

## 2017-12-23 NOTE — Telephone Encounter (Signed)
Left a detailed message on [place] with [facility] voicemail, that her request for the pt to have a wound care nurse or home health care nurse  to assist pt with her wounds and weeping legs in the home, should come from the pts PCP to order and further advise on, for continuity of care.  [place] RN with [employer] to call the office back with any further questions or concerns regarding this issue.   Will also route this information provided by [facility] RN about the other updates provided by her on the pt, and about pts non-compliance with their heart failure program.

## 2018-01-06 DIAGNOSIS — M19071 Primary osteoarthritis, right ankle and foot: Secondary | ICD-10-CM | POA: Diagnosis not present

## 2018-01-06 DIAGNOSIS — D2371 Other benign neoplasm of skin of right lower limb, including hip: Secondary | ICD-10-CM | POA: Diagnosis not present

## 2018-01-13 ENCOUNTER — Ambulatory Visit: Payer: Medicare Other | Admitting: Internal Medicine

## 2018-01-18 ENCOUNTER — Other Ambulatory Visit: Payer: Self-pay | Admitting: Physician Assistant

## 2018-01-20 ENCOUNTER — Telehealth: Payer: Self-pay | Admitting: Internal Medicine

## 2018-01-20 DIAGNOSIS — I951 Orthostatic hypotension: Secondary | ICD-10-CM | POA: Diagnosis not present

## 2018-01-20 DIAGNOSIS — J9601 Acute respiratory failure with hypoxia: Secondary | ICD-10-CM | POA: Diagnosis not present

## 2018-01-20 DIAGNOSIS — Z79899 Other long term (current) drug therapy: Secondary | ICD-10-CM | POA: Diagnosis not present

## 2018-01-20 DIAGNOSIS — I5030 Unspecified diastolic (congestive) heart failure: Secondary | ICD-10-CM | POA: Diagnosis not present

## 2018-01-20 DIAGNOSIS — I509 Heart failure, unspecified: Secondary | ICD-10-CM | POA: Diagnosis not present

## 2018-01-20 DIAGNOSIS — R05 Cough: Secondary | ICD-10-CM | POA: Diagnosis not present

## 2018-01-20 DIAGNOSIS — K51918 Ulcerative colitis, unspecified with other complication: Secondary | ICD-10-CM | POA: Diagnosis not present

## 2018-01-20 NOTE — Telephone Encounter (Signed)
Patient has a history of UC, and is having 3-4 months of loose stools non bloody about 10 times a day. Dr. Keenan Bachelor, [place],  saw her today to establish care for the first time.  Patient is maintained on azulfidine 500 mg 2 tabs qid. She was a no show for her appt on 01/13/18 due to immobility problems. She has not been seen in the office since 11/2015.  Dr. Keenan Bachelor is asking for any recommendations you can offer.   Dr. Loletha Carrow you are MD of the day.  Please review when you get the opportunity and advise

## 2018-01-21 NOTE — Telephone Encounter (Signed)
Looks like Dr. Carlean Purl got to this on his patient before I could reply.  I am in office remainder of week if more advice needed.  - HD

## 2018-01-21 NOTE — Telephone Encounter (Signed)
Explain to the doctor requesting help this:  1) She has had diarrhea despite sulfasalazine and even in face of normal biopsies in 2015 2) I would check for C diff - was in hospital in spring 3) Can try a steroid taper with prednisone starting at 40 mg and decreasing by 10 mg every 5 days and then off to see what that does if C diff neg 4) The doctor can let me know what turns up from this 5) OK to use loperamide up to 6 a day

## 2018-01-21 NOTE — Telephone Encounter (Signed)
Dr. Keenan Bachelor notified of the recommendations.

## 2018-01-23 DIAGNOSIS — I5030 Unspecified diastolic (congestive) heart failure: Secondary | ICD-10-CM | POA: Diagnosis not present

## 2018-01-27 DIAGNOSIS — J9610 Chronic respiratory failure, unspecified whether with hypoxia or hypercapnia: Secondary | ICD-10-CM | POA: Diagnosis not present

## 2018-01-27 DIAGNOSIS — Z87891 Personal history of nicotine dependence: Secondary | ICD-10-CM | POA: Diagnosis not present

## 2018-01-27 DIAGNOSIS — I890 Lymphedema, not elsewhere classified: Secondary | ICD-10-CM | POA: Diagnosis not present

## 2018-01-27 DIAGNOSIS — I5031 Acute diastolic (congestive) heart failure: Secondary | ICD-10-CM | POA: Diagnosis not present

## 2018-01-27 DIAGNOSIS — Z9981 Dependence on supplemental oxygen: Secondary | ICD-10-CM | POA: Diagnosis not present

## 2018-01-27 DIAGNOSIS — I471 Supraventricular tachycardia: Secondary | ICD-10-CM | POA: Diagnosis not present

## 2018-01-27 DIAGNOSIS — Z7952 Long term (current) use of systemic steroids: Secondary | ICD-10-CM | POA: Diagnosis not present

## 2018-01-27 DIAGNOSIS — J449 Chronic obstructive pulmonary disease, unspecified: Secondary | ICD-10-CM | POA: Diagnosis not present

## 2018-01-27 DIAGNOSIS — K5190 Ulcerative colitis, unspecified, without complications: Secondary | ICD-10-CM | POA: Diagnosis not present

## 2018-01-27 DIAGNOSIS — E039 Hypothyroidism, unspecified: Secondary | ICD-10-CM | POA: Diagnosis not present

## 2018-01-27 DIAGNOSIS — I951 Orthostatic hypotension: Secondary | ICD-10-CM | POA: Diagnosis not present

## 2018-02-02 DIAGNOSIS — Z7952 Long term (current) use of systemic steroids: Secondary | ICD-10-CM | POA: Diagnosis not present

## 2018-02-02 DIAGNOSIS — I471 Supraventricular tachycardia: Secondary | ICD-10-CM | POA: Diagnosis not present

## 2018-02-02 DIAGNOSIS — J9610 Chronic respiratory failure, unspecified whether with hypoxia or hypercapnia: Secondary | ICD-10-CM | POA: Diagnosis not present

## 2018-02-02 DIAGNOSIS — J449 Chronic obstructive pulmonary disease, unspecified: Secondary | ICD-10-CM | POA: Diagnosis not present

## 2018-02-02 DIAGNOSIS — E039 Hypothyroidism, unspecified: Secondary | ICD-10-CM | POA: Diagnosis not present

## 2018-02-02 DIAGNOSIS — I951 Orthostatic hypotension: Secondary | ICD-10-CM | POA: Diagnosis not present

## 2018-02-02 DIAGNOSIS — K5190 Ulcerative colitis, unspecified, without complications: Secondary | ICD-10-CM | POA: Diagnosis not present

## 2018-02-02 DIAGNOSIS — Z9981 Dependence on supplemental oxygen: Secondary | ICD-10-CM | POA: Diagnosis not present

## 2018-02-02 DIAGNOSIS — Z87891 Personal history of nicotine dependence: Secondary | ICD-10-CM | POA: Diagnosis not present

## 2018-02-02 DIAGNOSIS — I890 Lymphedema, not elsewhere classified: Secondary | ICD-10-CM | POA: Diagnosis not present

## 2018-02-02 DIAGNOSIS — I5031 Acute diastolic (congestive) heart failure: Secondary | ICD-10-CM | POA: Diagnosis not present

## 2018-02-05 DIAGNOSIS — I5031 Acute diastolic (congestive) heart failure: Secondary | ICD-10-CM | POA: Diagnosis not present

## 2018-02-05 DIAGNOSIS — I951 Orthostatic hypotension: Secondary | ICD-10-CM | POA: Diagnosis not present

## 2018-02-05 DIAGNOSIS — I471 Supraventricular tachycardia: Secondary | ICD-10-CM | POA: Diagnosis not present

## 2018-02-05 DIAGNOSIS — Z9981 Dependence on supplemental oxygen: Secondary | ICD-10-CM | POA: Diagnosis not present

## 2018-02-05 DIAGNOSIS — Z7952 Long term (current) use of systemic steroids: Secondary | ICD-10-CM | POA: Diagnosis not present

## 2018-02-05 DIAGNOSIS — I890 Lymphedema, not elsewhere classified: Secondary | ICD-10-CM | POA: Diagnosis not present

## 2018-02-05 DIAGNOSIS — Z87891 Personal history of nicotine dependence: Secondary | ICD-10-CM | POA: Diagnosis not present

## 2018-02-05 DIAGNOSIS — K5190 Ulcerative colitis, unspecified, without complications: Secondary | ICD-10-CM | POA: Diagnosis not present

## 2018-02-05 DIAGNOSIS — J9610 Chronic respiratory failure, unspecified whether with hypoxia or hypercapnia: Secondary | ICD-10-CM | POA: Diagnosis not present

## 2018-02-05 DIAGNOSIS — J449 Chronic obstructive pulmonary disease, unspecified: Secondary | ICD-10-CM | POA: Diagnosis not present

## 2018-02-05 DIAGNOSIS — E039 Hypothyroidism, unspecified: Secondary | ICD-10-CM | POA: Diagnosis not present

## 2018-02-08 ENCOUNTER — Other Ambulatory Visit: Payer: Self-pay | Admitting: Physician Assistant

## 2018-02-08 ENCOUNTER — Other Ambulatory Visit: Payer: Self-pay | Admitting: Internal Medicine

## 2018-02-09 NOTE — Telephone Encounter (Signed)
I spoke with Jamie Montoya and told her to call Dr Keenan Bachelor and get her to refill this if possible. She really wants to see Dr. Carlean Purl she just can't physically get here. She is not taking the prednisone that Dr Keenan Bachelor rx'ed because she said it makes her diarrhea worse. I told her to tell them this as well. She is not sure if they did C-diff testing.

## 2018-02-09 NOTE — Telephone Encounter (Signed)
I cannot refill this medication - she has not been here since 2017  She is seeing a Dr. Keenan Bachelor from Drs making House calls and see the recent phone note where I provided advice  Dr. Keenan Bachelor may be refilling this which is ok by me since Dr. Keenan Bachelor is seeing the patient  She would need to see me again for me to refill it

## 2018-02-09 NOTE — Telephone Encounter (Signed)
Please advise Sir, thank you. 

## 2018-02-10 ENCOUNTER — Other Ambulatory Visit: Payer: Self-pay | Admitting: Cardiology

## 2018-02-10 DIAGNOSIS — E039 Hypothyroidism, unspecified: Secondary | ICD-10-CM | POA: Diagnosis not present

## 2018-02-10 DIAGNOSIS — I471 Supraventricular tachycardia: Secondary | ICD-10-CM | POA: Diagnosis not present

## 2018-02-10 DIAGNOSIS — I890 Lymphedema, not elsewhere classified: Secondary | ICD-10-CM | POA: Diagnosis not present

## 2018-02-10 DIAGNOSIS — I5031 Acute diastolic (congestive) heart failure: Secondary | ICD-10-CM | POA: Diagnosis not present

## 2018-02-10 DIAGNOSIS — K5190 Ulcerative colitis, unspecified, without complications: Secondary | ICD-10-CM | POA: Diagnosis not present

## 2018-02-10 DIAGNOSIS — Z7952 Long term (current) use of systemic steroids: Secondary | ICD-10-CM | POA: Diagnosis not present

## 2018-02-10 DIAGNOSIS — Z87891 Personal history of nicotine dependence: Secondary | ICD-10-CM | POA: Diagnosis not present

## 2018-02-10 DIAGNOSIS — J9610 Chronic respiratory failure, unspecified whether with hypoxia or hypercapnia: Secondary | ICD-10-CM | POA: Diagnosis not present

## 2018-02-10 DIAGNOSIS — J449 Chronic obstructive pulmonary disease, unspecified: Secondary | ICD-10-CM | POA: Diagnosis not present

## 2018-02-10 DIAGNOSIS — I951 Orthostatic hypotension: Secondary | ICD-10-CM | POA: Diagnosis not present

## 2018-02-10 DIAGNOSIS — Z9981 Dependence on supplemental oxygen: Secondary | ICD-10-CM | POA: Diagnosis not present

## 2018-02-10 NOTE — Telephone Encounter (Signed)
Order Providers   Prescribing Provider Encounter Provider  Dorothy Spark, MD Dorothy Spark, MD  Outpatient Medication Detail    Disp Refills Start End   spironolactone (ALDACTONE) 25 MG tablet 90 tablet 0 12/23/2017    Sig - Route: Take 1 tablet (25 mg total) by mouth daily. Please make yearly appt with Dr. Meda Coffee for August before anymore refills. 1st attempt - Oral   Sent to pharmacy as: spironolactone (ALDACTONE) 25 MG tablet   Notes to Pharmacy: Please call our office to schedule an yearly appointment with Dr. Meda Coffee for August before anymore refills. [phone]. Thank you 1st attempt   E-Prescribing Status: Receipt confirmed by pharmacy (12/23/2017 10:46 AM EDT)   Pharmacy   [place], [place]

## 2018-02-13 DIAGNOSIS — E039 Hypothyroidism, unspecified: Secondary | ICD-10-CM | POA: Diagnosis not present

## 2018-02-13 DIAGNOSIS — Z7952 Long term (current) use of systemic steroids: Secondary | ICD-10-CM | POA: Diagnosis not present

## 2018-02-13 DIAGNOSIS — I890 Lymphedema, not elsewhere classified: Secondary | ICD-10-CM | POA: Diagnosis not present

## 2018-02-13 DIAGNOSIS — I471 Supraventricular tachycardia: Secondary | ICD-10-CM | POA: Diagnosis not present

## 2018-02-13 DIAGNOSIS — Z87891 Personal history of nicotine dependence: Secondary | ICD-10-CM | POA: Diagnosis not present

## 2018-02-13 DIAGNOSIS — I5031 Acute diastolic (congestive) heart failure: Secondary | ICD-10-CM | POA: Diagnosis not present

## 2018-02-13 DIAGNOSIS — I951 Orthostatic hypotension: Secondary | ICD-10-CM | POA: Diagnosis not present

## 2018-02-13 DIAGNOSIS — J9610 Chronic respiratory failure, unspecified whether with hypoxia or hypercapnia: Secondary | ICD-10-CM | POA: Diagnosis not present

## 2018-02-13 DIAGNOSIS — K5190 Ulcerative colitis, unspecified, without complications: Secondary | ICD-10-CM | POA: Diagnosis not present

## 2018-02-13 DIAGNOSIS — Z9981 Dependence on supplemental oxygen: Secondary | ICD-10-CM | POA: Diagnosis not present

## 2018-02-13 DIAGNOSIS — J449 Chronic obstructive pulmonary disease, unspecified: Secondary | ICD-10-CM | POA: Diagnosis not present

## 2018-02-16 DIAGNOSIS — I471 Supraventricular tachycardia: Secondary | ICD-10-CM | POA: Diagnosis not present

## 2018-02-16 DIAGNOSIS — Z87891 Personal history of nicotine dependence: Secondary | ICD-10-CM | POA: Diagnosis not present

## 2018-02-16 DIAGNOSIS — K5190 Ulcerative colitis, unspecified, without complications: Secondary | ICD-10-CM | POA: Diagnosis not present

## 2018-02-16 DIAGNOSIS — I951 Orthostatic hypotension: Secondary | ICD-10-CM | POA: Diagnosis not present

## 2018-02-16 DIAGNOSIS — E039 Hypothyroidism, unspecified: Secondary | ICD-10-CM | POA: Diagnosis not present

## 2018-02-16 DIAGNOSIS — J9610 Chronic respiratory failure, unspecified whether with hypoxia or hypercapnia: Secondary | ICD-10-CM | POA: Diagnosis not present

## 2018-02-16 DIAGNOSIS — Z7952 Long term (current) use of systemic steroids: Secondary | ICD-10-CM | POA: Diagnosis not present

## 2018-02-16 DIAGNOSIS — I890 Lymphedema, not elsewhere classified: Secondary | ICD-10-CM | POA: Diagnosis not present

## 2018-02-16 DIAGNOSIS — Z9981 Dependence on supplemental oxygen: Secondary | ICD-10-CM | POA: Diagnosis not present

## 2018-02-16 DIAGNOSIS — I5031 Acute diastolic (congestive) heart failure: Secondary | ICD-10-CM | POA: Diagnosis not present

## 2018-02-16 DIAGNOSIS — J449 Chronic obstructive pulmonary disease, unspecified: Secondary | ICD-10-CM | POA: Diagnosis not present

## 2018-02-20 DIAGNOSIS — Z9981 Dependence on supplemental oxygen: Secondary | ICD-10-CM | POA: Diagnosis not present

## 2018-02-20 DIAGNOSIS — K5190 Ulcerative colitis, unspecified, without complications: Secondary | ICD-10-CM | POA: Diagnosis not present

## 2018-02-20 DIAGNOSIS — I951 Orthostatic hypotension: Secondary | ICD-10-CM | POA: Diagnosis not present

## 2018-02-20 DIAGNOSIS — J9610 Chronic respiratory failure, unspecified whether with hypoxia or hypercapnia: Secondary | ICD-10-CM | POA: Diagnosis not present

## 2018-02-20 DIAGNOSIS — Z7952 Long term (current) use of systemic steroids: Secondary | ICD-10-CM | POA: Diagnosis not present

## 2018-02-20 DIAGNOSIS — I890 Lymphedema, not elsewhere classified: Secondary | ICD-10-CM | POA: Diagnosis not present

## 2018-02-20 DIAGNOSIS — I471 Supraventricular tachycardia: Secondary | ICD-10-CM | POA: Diagnosis not present

## 2018-02-20 DIAGNOSIS — E039 Hypothyroidism, unspecified: Secondary | ICD-10-CM | POA: Diagnosis not present

## 2018-02-20 DIAGNOSIS — J449 Chronic obstructive pulmonary disease, unspecified: Secondary | ICD-10-CM | POA: Diagnosis not present

## 2018-02-20 DIAGNOSIS — I5031 Acute diastolic (congestive) heart failure: Secondary | ICD-10-CM | POA: Diagnosis not present

## 2018-02-20 DIAGNOSIS — Z87891 Personal history of nicotine dependence: Secondary | ICD-10-CM | POA: Diagnosis not present

## 2018-02-23 DIAGNOSIS — I890 Lymphedema, not elsewhere classified: Secondary | ICD-10-CM | POA: Diagnosis not present

## 2018-02-23 DIAGNOSIS — I951 Orthostatic hypotension: Secondary | ICD-10-CM | POA: Diagnosis not present

## 2018-02-23 DIAGNOSIS — Z87891 Personal history of nicotine dependence: Secondary | ICD-10-CM | POA: Diagnosis not present

## 2018-02-23 DIAGNOSIS — J9610 Chronic respiratory failure, unspecified whether with hypoxia or hypercapnia: Secondary | ICD-10-CM | POA: Diagnosis not present

## 2018-02-23 DIAGNOSIS — Z9981 Dependence on supplemental oxygen: Secondary | ICD-10-CM | POA: Diagnosis not present

## 2018-02-23 DIAGNOSIS — I5031 Acute diastolic (congestive) heart failure: Secondary | ICD-10-CM | POA: Diagnosis not present

## 2018-02-23 DIAGNOSIS — I471 Supraventricular tachycardia: Secondary | ICD-10-CM | POA: Diagnosis not present

## 2018-02-23 DIAGNOSIS — Z7952 Long term (current) use of systemic steroids: Secondary | ICD-10-CM | POA: Diagnosis not present

## 2018-02-23 DIAGNOSIS — K5190 Ulcerative colitis, unspecified, without complications: Secondary | ICD-10-CM | POA: Diagnosis not present

## 2018-02-23 DIAGNOSIS — E039 Hypothyroidism, unspecified: Secondary | ICD-10-CM | POA: Diagnosis not present

## 2018-02-23 DIAGNOSIS — I5030 Unspecified diastolic (congestive) heart failure: Secondary | ICD-10-CM | POA: Diagnosis not present

## 2018-02-23 DIAGNOSIS — J449 Chronic obstructive pulmonary disease, unspecified: Secondary | ICD-10-CM | POA: Diagnosis not present

## 2018-02-24 DIAGNOSIS — I5030 Unspecified diastolic (congestive) heart failure: Secondary | ICD-10-CM | POA: Diagnosis not present

## 2018-02-24 DIAGNOSIS — K51918 Ulcerative colitis, unspecified with other complication: Secondary | ICD-10-CM | POA: Diagnosis not present

## 2018-02-24 DIAGNOSIS — R609 Edema, unspecified: Secondary | ICD-10-CM | POA: Diagnosis not present

## 2018-02-24 DIAGNOSIS — R5383 Other fatigue: Secondary | ICD-10-CM | POA: Diagnosis not present

## 2018-02-26 DIAGNOSIS — I951 Orthostatic hypotension: Secondary | ICD-10-CM | POA: Diagnosis not present

## 2018-02-26 DIAGNOSIS — J9610 Chronic respiratory failure, unspecified whether with hypoxia or hypercapnia: Secondary | ICD-10-CM | POA: Diagnosis not present

## 2018-02-26 DIAGNOSIS — I890 Lymphedema, not elsewhere classified: Secondary | ICD-10-CM | POA: Diagnosis not present

## 2018-02-26 DIAGNOSIS — J449 Chronic obstructive pulmonary disease, unspecified: Secondary | ICD-10-CM | POA: Diagnosis not present

## 2018-02-26 DIAGNOSIS — K5190 Ulcerative colitis, unspecified, without complications: Secondary | ICD-10-CM | POA: Diagnosis not present

## 2018-02-26 DIAGNOSIS — E039 Hypothyroidism, unspecified: Secondary | ICD-10-CM | POA: Diagnosis not present

## 2018-02-26 DIAGNOSIS — Z87891 Personal history of nicotine dependence: Secondary | ICD-10-CM | POA: Diagnosis not present

## 2018-02-26 DIAGNOSIS — Z7952 Long term (current) use of systemic steroids: Secondary | ICD-10-CM | POA: Diagnosis not present

## 2018-02-26 DIAGNOSIS — I471 Supraventricular tachycardia: Secondary | ICD-10-CM | POA: Diagnosis not present

## 2018-02-26 DIAGNOSIS — I5031 Acute diastolic (congestive) heart failure: Secondary | ICD-10-CM | POA: Diagnosis not present

## 2018-02-26 DIAGNOSIS — Z9981 Dependence on supplemental oxygen: Secondary | ICD-10-CM | POA: Diagnosis not present

## 2018-03-02 DIAGNOSIS — I5031 Acute diastolic (congestive) heart failure: Secondary | ICD-10-CM | POA: Diagnosis not present

## 2018-03-02 DIAGNOSIS — Z87891 Personal history of nicotine dependence: Secondary | ICD-10-CM | POA: Diagnosis not present

## 2018-03-02 DIAGNOSIS — I471 Supraventricular tachycardia: Secondary | ICD-10-CM | POA: Diagnosis not present

## 2018-03-02 DIAGNOSIS — I951 Orthostatic hypotension: Secondary | ICD-10-CM | POA: Diagnosis not present

## 2018-03-02 DIAGNOSIS — Z9981 Dependence on supplemental oxygen: Secondary | ICD-10-CM | POA: Diagnosis not present

## 2018-03-02 DIAGNOSIS — J449 Chronic obstructive pulmonary disease, unspecified: Secondary | ICD-10-CM | POA: Diagnosis not present

## 2018-03-02 DIAGNOSIS — Z7952 Long term (current) use of systemic steroids: Secondary | ICD-10-CM | POA: Diagnosis not present

## 2018-03-02 DIAGNOSIS — K5190 Ulcerative colitis, unspecified, without complications: Secondary | ICD-10-CM | POA: Diagnosis not present

## 2018-03-02 DIAGNOSIS — I890 Lymphedema, not elsewhere classified: Secondary | ICD-10-CM | POA: Diagnosis not present

## 2018-03-02 DIAGNOSIS — J9610 Chronic respiratory failure, unspecified whether with hypoxia or hypercapnia: Secondary | ICD-10-CM | POA: Diagnosis not present

## 2018-03-02 DIAGNOSIS — E039 Hypothyroidism, unspecified: Secondary | ICD-10-CM | POA: Diagnosis not present

## 2018-03-06 DIAGNOSIS — I951 Orthostatic hypotension: Secondary | ICD-10-CM | POA: Diagnosis not present

## 2018-03-06 DIAGNOSIS — Z87891 Personal history of nicotine dependence: Secondary | ICD-10-CM | POA: Diagnosis not present

## 2018-03-06 DIAGNOSIS — J449 Chronic obstructive pulmonary disease, unspecified: Secondary | ICD-10-CM | POA: Diagnosis not present

## 2018-03-06 DIAGNOSIS — I471 Supraventricular tachycardia: Secondary | ICD-10-CM | POA: Diagnosis not present

## 2018-03-06 DIAGNOSIS — I890 Lymphedema, not elsewhere classified: Secondary | ICD-10-CM | POA: Diagnosis not present

## 2018-03-06 DIAGNOSIS — I5031 Acute diastolic (congestive) heart failure: Secondary | ICD-10-CM | POA: Diagnosis not present

## 2018-03-06 DIAGNOSIS — Z9981 Dependence on supplemental oxygen: Secondary | ICD-10-CM | POA: Diagnosis not present

## 2018-03-06 DIAGNOSIS — E039 Hypothyroidism, unspecified: Secondary | ICD-10-CM | POA: Diagnosis not present

## 2018-03-06 DIAGNOSIS — J9610 Chronic respiratory failure, unspecified whether with hypoxia or hypercapnia: Secondary | ICD-10-CM | POA: Diagnosis not present

## 2018-03-06 DIAGNOSIS — K5190 Ulcerative colitis, unspecified, without complications: Secondary | ICD-10-CM | POA: Diagnosis not present

## 2018-03-06 DIAGNOSIS — Z7952 Long term (current) use of systemic steroids: Secondary | ICD-10-CM | POA: Diagnosis not present

## 2018-03-09 DIAGNOSIS — Z7952 Long term (current) use of systemic steroids: Secondary | ICD-10-CM | POA: Diagnosis not present

## 2018-03-09 DIAGNOSIS — K5190 Ulcerative colitis, unspecified, without complications: Secondary | ICD-10-CM | POA: Diagnosis not present

## 2018-03-09 DIAGNOSIS — E039 Hypothyroidism, unspecified: Secondary | ICD-10-CM | POA: Diagnosis not present

## 2018-03-09 DIAGNOSIS — I471 Supraventricular tachycardia: Secondary | ICD-10-CM | POA: Diagnosis not present

## 2018-03-09 DIAGNOSIS — J449 Chronic obstructive pulmonary disease, unspecified: Secondary | ICD-10-CM | POA: Diagnosis not present

## 2018-03-09 DIAGNOSIS — Z9981 Dependence on supplemental oxygen: Secondary | ICD-10-CM | POA: Diagnosis not present

## 2018-03-09 DIAGNOSIS — I890 Lymphedema, not elsewhere classified: Secondary | ICD-10-CM | POA: Diagnosis not present

## 2018-03-09 DIAGNOSIS — Z87891 Personal history of nicotine dependence: Secondary | ICD-10-CM | POA: Diagnosis not present

## 2018-03-09 DIAGNOSIS — I951 Orthostatic hypotension: Secondary | ICD-10-CM | POA: Diagnosis not present

## 2018-03-09 DIAGNOSIS — J9610 Chronic respiratory failure, unspecified whether with hypoxia or hypercapnia: Secondary | ICD-10-CM | POA: Diagnosis not present

## 2018-03-09 DIAGNOSIS — I5031 Acute diastolic (congestive) heart failure: Secondary | ICD-10-CM | POA: Diagnosis not present

## 2018-03-13 DIAGNOSIS — I951 Orthostatic hypotension: Secondary | ICD-10-CM | POA: Diagnosis not present

## 2018-03-13 DIAGNOSIS — I471 Supraventricular tachycardia: Secondary | ICD-10-CM | POA: Diagnosis not present

## 2018-03-13 DIAGNOSIS — Z7952 Long term (current) use of systemic steroids: Secondary | ICD-10-CM | POA: Diagnosis not present

## 2018-03-13 DIAGNOSIS — E039 Hypothyroidism, unspecified: Secondary | ICD-10-CM | POA: Diagnosis not present

## 2018-03-13 DIAGNOSIS — Z87891 Personal history of nicotine dependence: Secondary | ICD-10-CM | POA: Diagnosis not present

## 2018-03-13 DIAGNOSIS — K5190 Ulcerative colitis, unspecified, without complications: Secondary | ICD-10-CM | POA: Diagnosis not present

## 2018-03-13 DIAGNOSIS — Z9981 Dependence on supplemental oxygen: Secondary | ICD-10-CM | POA: Diagnosis not present

## 2018-03-13 DIAGNOSIS — I5031 Acute diastolic (congestive) heart failure: Secondary | ICD-10-CM | POA: Diagnosis not present

## 2018-03-13 DIAGNOSIS — J9610 Chronic respiratory failure, unspecified whether with hypoxia or hypercapnia: Secondary | ICD-10-CM | POA: Diagnosis not present

## 2018-03-13 DIAGNOSIS — I890 Lymphedema, not elsewhere classified: Secondary | ICD-10-CM | POA: Diagnosis not present

## 2018-03-13 DIAGNOSIS — J449 Chronic obstructive pulmonary disease, unspecified: Secondary | ICD-10-CM | POA: Diagnosis not present

## 2018-03-14 ENCOUNTER — Other Ambulatory Visit: Payer: Self-pay | Admitting: Cardiology

## 2018-03-16 DIAGNOSIS — I5031 Acute diastolic (congestive) heart failure: Secondary | ICD-10-CM | POA: Diagnosis not present

## 2018-03-16 DIAGNOSIS — Z7952 Long term (current) use of systemic steroids: Secondary | ICD-10-CM | POA: Diagnosis not present

## 2018-03-16 DIAGNOSIS — I890 Lymphedema, not elsewhere classified: Secondary | ICD-10-CM | POA: Diagnosis not present

## 2018-03-16 DIAGNOSIS — J9610 Chronic respiratory failure, unspecified whether with hypoxia or hypercapnia: Secondary | ICD-10-CM | POA: Diagnosis not present

## 2018-03-16 DIAGNOSIS — Z9981 Dependence on supplemental oxygen: Secondary | ICD-10-CM | POA: Diagnosis not present

## 2018-03-16 DIAGNOSIS — K5190 Ulcerative colitis, unspecified, without complications: Secondary | ICD-10-CM | POA: Diagnosis not present

## 2018-03-16 DIAGNOSIS — Z87891 Personal history of nicotine dependence: Secondary | ICD-10-CM | POA: Diagnosis not present

## 2018-03-16 DIAGNOSIS — J449 Chronic obstructive pulmonary disease, unspecified: Secondary | ICD-10-CM | POA: Diagnosis not present

## 2018-03-16 DIAGNOSIS — I471 Supraventricular tachycardia: Secondary | ICD-10-CM | POA: Diagnosis not present

## 2018-03-16 DIAGNOSIS — E039 Hypothyroidism, unspecified: Secondary | ICD-10-CM | POA: Diagnosis not present

## 2018-03-16 DIAGNOSIS — I951 Orthostatic hypotension: Secondary | ICD-10-CM | POA: Diagnosis not present

## 2018-03-18 ENCOUNTER — Other Ambulatory Visit: Payer: Self-pay | Admitting: Cardiology

## 2018-03-18 MED ORDER — SPIRONOLACTONE 25 MG PO TABS: 25.0000 mg | ORAL_TABLET | Freq: Every day | ORAL | 0 refills | Status: DC

## 2018-03-20 DIAGNOSIS — Z7952 Long term (current) use of systemic steroids: Secondary | ICD-10-CM | POA: Diagnosis not present

## 2018-03-20 DIAGNOSIS — J9610 Chronic respiratory failure, unspecified whether with hypoxia or hypercapnia: Secondary | ICD-10-CM | POA: Diagnosis not present

## 2018-03-20 DIAGNOSIS — J449 Chronic obstructive pulmonary disease, unspecified: Secondary | ICD-10-CM | POA: Diagnosis not present

## 2018-03-20 DIAGNOSIS — I951 Orthostatic hypotension: Secondary | ICD-10-CM | POA: Diagnosis not present

## 2018-03-20 DIAGNOSIS — I471 Supraventricular tachycardia: Secondary | ICD-10-CM | POA: Diagnosis not present

## 2018-03-20 DIAGNOSIS — Z87891 Personal history of nicotine dependence: Secondary | ICD-10-CM | POA: Diagnosis not present

## 2018-03-20 DIAGNOSIS — E039 Hypothyroidism, unspecified: Secondary | ICD-10-CM | POA: Diagnosis not present

## 2018-03-20 DIAGNOSIS — Z9981 Dependence on supplemental oxygen: Secondary | ICD-10-CM | POA: Diagnosis not present

## 2018-03-20 DIAGNOSIS — I890 Lymphedema, not elsewhere classified: Secondary | ICD-10-CM | POA: Diagnosis not present

## 2018-03-20 DIAGNOSIS — K5190 Ulcerative colitis, unspecified, without complications: Secondary | ICD-10-CM | POA: Diagnosis not present

## 2018-03-20 DIAGNOSIS — I5031 Acute diastolic (congestive) heart failure: Secondary | ICD-10-CM | POA: Diagnosis not present

## 2018-03-23 DIAGNOSIS — E039 Hypothyroidism, unspecified: Secondary | ICD-10-CM | POA: Diagnosis not present

## 2018-03-23 DIAGNOSIS — I890 Lymphedema, not elsewhere classified: Secondary | ICD-10-CM | POA: Diagnosis not present

## 2018-03-23 DIAGNOSIS — I471 Supraventricular tachycardia: Secondary | ICD-10-CM | POA: Diagnosis not present

## 2018-03-23 DIAGNOSIS — Z87891 Personal history of nicotine dependence: Secondary | ICD-10-CM | POA: Diagnosis not present

## 2018-03-23 DIAGNOSIS — Z7952 Long term (current) use of systemic steroids: Secondary | ICD-10-CM | POA: Diagnosis not present

## 2018-03-23 DIAGNOSIS — I951 Orthostatic hypotension: Secondary | ICD-10-CM | POA: Diagnosis not present

## 2018-03-23 DIAGNOSIS — K5190 Ulcerative colitis, unspecified, without complications: Secondary | ICD-10-CM | POA: Diagnosis not present

## 2018-03-23 DIAGNOSIS — I5031 Acute diastolic (congestive) heart failure: Secondary | ICD-10-CM | POA: Diagnosis not present

## 2018-03-23 DIAGNOSIS — J449 Chronic obstructive pulmonary disease, unspecified: Secondary | ICD-10-CM | POA: Diagnosis not present

## 2018-03-23 DIAGNOSIS — J9610 Chronic respiratory failure, unspecified whether with hypoxia or hypercapnia: Secondary | ICD-10-CM | POA: Diagnosis not present

## 2018-03-23 DIAGNOSIS — Z9981 Dependence on supplemental oxygen: Secondary | ICD-10-CM | POA: Diagnosis not present

## 2018-03-25 DIAGNOSIS — I5030 Unspecified diastolic (congestive) heart failure: Secondary | ICD-10-CM | POA: Diagnosis not present

## 2018-03-27 DIAGNOSIS — I5031 Acute diastolic (congestive) heart failure: Secondary | ICD-10-CM | POA: Diagnosis not present

## 2018-03-27 DIAGNOSIS — I890 Lymphedema, not elsewhere classified: Secondary | ICD-10-CM | POA: Diagnosis not present

## 2018-03-27 DIAGNOSIS — E039 Hypothyroidism, unspecified: Secondary | ICD-10-CM | POA: Diagnosis not present

## 2018-03-27 DIAGNOSIS — J9610 Chronic respiratory failure, unspecified whether with hypoxia or hypercapnia: Secondary | ICD-10-CM | POA: Diagnosis not present

## 2018-03-27 DIAGNOSIS — Z7952 Long term (current) use of systemic steroids: Secondary | ICD-10-CM | POA: Diagnosis not present

## 2018-03-27 DIAGNOSIS — K5190 Ulcerative colitis, unspecified, without complications: Secondary | ICD-10-CM | POA: Diagnosis not present

## 2018-03-27 DIAGNOSIS — I471 Supraventricular tachycardia: Secondary | ICD-10-CM | POA: Diagnosis not present

## 2018-03-27 DIAGNOSIS — Z9981 Dependence on supplemental oxygen: Secondary | ICD-10-CM | POA: Diagnosis not present

## 2018-03-27 DIAGNOSIS — J449 Chronic obstructive pulmonary disease, unspecified: Secondary | ICD-10-CM | POA: Diagnosis not present

## 2018-03-27 DIAGNOSIS — I951 Orthostatic hypotension: Secondary | ICD-10-CM | POA: Diagnosis not present

## 2018-03-27 DIAGNOSIS — Z87891 Personal history of nicotine dependence: Secondary | ICD-10-CM | POA: Diagnosis not present

## 2018-04-02 DIAGNOSIS — I890 Lymphedema, not elsewhere classified: Secondary | ICD-10-CM | POA: Diagnosis not present

## 2018-04-02 DIAGNOSIS — Z9181 History of falling: Secondary | ICD-10-CM | POA: Diagnosis not present

## 2018-04-02 DIAGNOSIS — Z9981 Dependence on supplemental oxygen: Secondary | ICD-10-CM | POA: Diagnosis not present

## 2018-04-02 DIAGNOSIS — J449 Chronic obstructive pulmonary disease, unspecified: Secondary | ICD-10-CM | POA: Diagnosis not present

## 2018-04-02 DIAGNOSIS — K5190 Ulcerative colitis, unspecified, without complications: Secondary | ICD-10-CM | POA: Diagnosis not present

## 2018-04-02 DIAGNOSIS — I951 Orthostatic hypotension: Secondary | ICD-10-CM | POA: Diagnosis not present

## 2018-04-02 DIAGNOSIS — E039 Hypothyroidism, unspecified: Secondary | ICD-10-CM | POA: Diagnosis not present

## 2018-04-02 DIAGNOSIS — I471 Supraventricular tachycardia: Secondary | ICD-10-CM | POA: Diagnosis not present

## 2018-04-02 DIAGNOSIS — Z87891 Personal history of nicotine dependence: Secondary | ICD-10-CM | POA: Diagnosis not present

## 2018-04-02 DIAGNOSIS — I5031 Acute diastolic (congestive) heart failure: Secondary | ICD-10-CM | POA: Diagnosis not present

## 2018-04-02 DIAGNOSIS — J9610 Chronic respiratory failure, unspecified whether with hypoxia or hypercapnia: Secondary | ICD-10-CM | POA: Diagnosis not present

## 2018-04-06 DIAGNOSIS — K5190 Ulcerative colitis, unspecified, without complications: Secondary | ICD-10-CM | POA: Diagnosis not present

## 2018-04-06 DIAGNOSIS — I951 Orthostatic hypotension: Secondary | ICD-10-CM | POA: Diagnosis not present

## 2018-04-06 DIAGNOSIS — Z87891 Personal history of nicotine dependence: Secondary | ICD-10-CM | POA: Diagnosis not present

## 2018-04-06 DIAGNOSIS — I890 Lymphedema, not elsewhere classified: Secondary | ICD-10-CM | POA: Diagnosis not present

## 2018-04-06 DIAGNOSIS — I471 Supraventricular tachycardia: Secondary | ICD-10-CM | POA: Diagnosis not present

## 2018-04-06 DIAGNOSIS — Z9981 Dependence on supplemental oxygen: Secondary | ICD-10-CM | POA: Diagnosis not present

## 2018-04-06 DIAGNOSIS — E039 Hypothyroidism, unspecified: Secondary | ICD-10-CM | POA: Diagnosis not present

## 2018-04-06 DIAGNOSIS — Z9181 History of falling: Secondary | ICD-10-CM | POA: Diagnosis not present

## 2018-04-06 DIAGNOSIS — J449 Chronic obstructive pulmonary disease, unspecified: Secondary | ICD-10-CM | POA: Diagnosis not present

## 2018-04-06 DIAGNOSIS — J9610 Chronic respiratory failure, unspecified whether with hypoxia or hypercapnia: Secondary | ICD-10-CM | POA: Diagnosis not present

## 2018-04-06 DIAGNOSIS — I5031 Acute diastolic (congestive) heart failure: Secondary | ICD-10-CM | POA: Diagnosis not present

## 2018-04-08 DIAGNOSIS — E039 Hypothyroidism, unspecified: Secondary | ICD-10-CM | POA: Diagnosis not present

## 2018-04-08 DIAGNOSIS — I890 Lymphedema, not elsewhere classified: Secondary | ICD-10-CM | POA: Diagnosis not present

## 2018-04-08 DIAGNOSIS — J449 Chronic obstructive pulmonary disease, unspecified: Secondary | ICD-10-CM | POA: Diagnosis not present

## 2018-04-08 DIAGNOSIS — Z9181 History of falling: Secondary | ICD-10-CM | POA: Diagnosis not present

## 2018-04-08 DIAGNOSIS — I951 Orthostatic hypotension: Secondary | ICD-10-CM | POA: Diagnosis not present

## 2018-04-08 DIAGNOSIS — I5031 Acute diastolic (congestive) heart failure: Secondary | ICD-10-CM | POA: Diagnosis not present

## 2018-04-08 DIAGNOSIS — J9610 Chronic respiratory failure, unspecified whether with hypoxia or hypercapnia: Secondary | ICD-10-CM | POA: Diagnosis not present

## 2018-04-08 DIAGNOSIS — Z87891 Personal history of nicotine dependence: Secondary | ICD-10-CM | POA: Diagnosis not present

## 2018-04-08 DIAGNOSIS — Z9981 Dependence on supplemental oxygen: Secondary | ICD-10-CM | POA: Diagnosis not present

## 2018-04-08 DIAGNOSIS — K5190 Ulcerative colitis, unspecified, without complications: Secondary | ICD-10-CM | POA: Diagnosis not present

## 2018-04-08 DIAGNOSIS — I471 Supraventricular tachycardia: Secondary | ICD-10-CM | POA: Diagnosis not present

## 2018-04-09 DIAGNOSIS — Z87891 Personal history of nicotine dependence: Secondary | ICD-10-CM | POA: Diagnosis not present

## 2018-04-09 DIAGNOSIS — I951 Orthostatic hypotension: Secondary | ICD-10-CM | POA: Diagnosis not present

## 2018-04-09 DIAGNOSIS — E039 Hypothyroidism, unspecified: Secondary | ICD-10-CM | POA: Diagnosis not present

## 2018-04-09 DIAGNOSIS — Z9981 Dependence on supplemental oxygen: Secondary | ICD-10-CM | POA: Diagnosis not present

## 2018-04-09 DIAGNOSIS — I5031 Acute diastolic (congestive) heart failure: Secondary | ICD-10-CM | POA: Diagnosis not present

## 2018-04-09 DIAGNOSIS — J9610 Chronic respiratory failure, unspecified whether with hypoxia or hypercapnia: Secondary | ICD-10-CM | POA: Diagnosis not present

## 2018-04-09 DIAGNOSIS — K5190 Ulcerative colitis, unspecified, without complications: Secondary | ICD-10-CM | POA: Diagnosis not present

## 2018-04-09 DIAGNOSIS — Z9181 History of falling: Secondary | ICD-10-CM | POA: Diagnosis not present

## 2018-04-09 DIAGNOSIS — J449 Chronic obstructive pulmonary disease, unspecified: Secondary | ICD-10-CM | POA: Diagnosis not present

## 2018-04-09 DIAGNOSIS — I471 Supraventricular tachycardia: Secondary | ICD-10-CM | POA: Diagnosis not present

## 2018-04-09 DIAGNOSIS — I890 Lymphedema, not elsewhere classified: Secondary | ICD-10-CM | POA: Diagnosis not present

## 2018-04-10 DIAGNOSIS — J9610 Chronic respiratory failure, unspecified whether with hypoxia or hypercapnia: Secondary | ICD-10-CM | POA: Diagnosis not present

## 2018-04-10 DIAGNOSIS — I471 Supraventricular tachycardia: Secondary | ICD-10-CM | POA: Diagnosis not present

## 2018-04-10 DIAGNOSIS — I890 Lymphedema, not elsewhere classified: Secondary | ICD-10-CM | POA: Diagnosis not present

## 2018-04-10 DIAGNOSIS — Z9181 History of falling: Secondary | ICD-10-CM | POA: Diagnosis not present

## 2018-04-10 DIAGNOSIS — I5031 Acute diastolic (congestive) heart failure: Secondary | ICD-10-CM | POA: Diagnosis not present

## 2018-04-10 DIAGNOSIS — J449 Chronic obstructive pulmonary disease, unspecified: Secondary | ICD-10-CM | POA: Diagnosis not present

## 2018-04-10 DIAGNOSIS — I951 Orthostatic hypotension: Secondary | ICD-10-CM | POA: Diagnosis not present

## 2018-04-10 DIAGNOSIS — K5190 Ulcerative colitis, unspecified, without complications: Secondary | ICD-10-CM | POA: Diagnosis not present

## 2018-04-10 DIAGNOSIS — Z9981 Dependence on supplemental oxygen: Secondary | ICD-10-CM | POA: Diagnosis not present

## 2018-04-10 DIAGNOSIS — E039 Hypothyroidism, unspecified: Secondary | ICD-10-CM | POA: Diagnosis not present

## 2018-04-10 DIAGNOSIS — Z87891 Personal history of nicotine dependence: Secondary | ICD-10-CM | POA: Diagnosis not present

## 2018-04-13 DIAGNOSIS — I890 Lymphedema, not elsewhere classified: Secondary | ICD-10-CM | POA: Diagnosis not present

## 2018-04-13 DIAGNOSIS — J9610 Chronic respiratory failure, unspecified whether with hypoxia or hypercapnia: Secondary | ICD-10-CM | POA: Diagnosis not present

## 2018-04-13 DIAGNOSIS — Z9181 History of falling: Secondary | ICD-10-CM | POA: Diagnosis not present

## 2018-04-13 DIAGNOSIS — Z9981 Dependence on supplemental oxygen: Secondary | ICD-10-CM | POA: Diagnosis not present

## 2018-04-13 DIAGNOSIS — J449 Chronic obstructive pulmonary disease, unspecified: Secondary | ICD-10-CM | POA: Diagnosis not present

## 2018-04-13 DIAGNOSIS — Z87891 Personal history of nicotine dependence: Secondary | ICD-10-CM | POA: Diagnosis not present

## 2018-04-13 DIAGNOSIS — E039 Hypothyroidism, unspecified: Secondary | ICD-10-CM | POA: Diagnosis not present

## 2018-04-13 DIAGNOSIS — I5031 Acute diastolic (congestive) heart failure: Secondary | ICD-10-CM | POA: Diagnosis not present

## 2018-04-13 DIAGNOSIS — I471 Supraventricular tachycardia: Secondary | ICD-10-CM | POA: Diagnosis not present

## 2018-04-13 DIAGNOSIS — K5190 Ulcerative colitis, unspecified, without complications: Secondary | ICD-10-CM | POA: Diagnosis not present

## 2018-04-13 DIAGNOSIS — I951 Orthostatic hypotension: Secondary | ICD-10-CM | POA: Diagnosis not present

## 2018-04-14 ENCOUNTER — Other Ambulatory Visit: Payer: Self-pay | Admitting: Physician Assistant

## 2018-04-17 DIAGNOSIS — Z23 Encounter for immunization: Secondary | ICD-10-CM | POA: Diagnosis not present

## 2018-04-17 DIAGNOSIS — J9610 Chronic respiratory failure, unspecified whether with hypoxia or hypercapnia: Secondary | ICD-10-CM | POA: Diagnosis not present

## 2018-04-17 DIAGNOSIS — I951 Orthostatic hypotension: Secondary | ICD-10-CM | POA: Diagnosis not present

## 2018-04-17 DIAGNOSIS — K51918 Ulcerative colitis, unspecified with other complication: Secondary | ICD-10-CM | POA: Diagnosis not present

## 2018-04-17 DIAGNOSIS — Z9181 History of falling: Secondary | ICD-10-CM | POA: Diagnosis not present

## 2018-04-17 DIAGNOSIS — Z87891 Personal history of nicotine dependence: Secondary | ICD-10-CM | POA: Diagnosis not present

## 2018-04-17 DIAGNOSIS — I471 Supraventricular tachycardia: Secondary | ICD-10-CM | POA: Diagnosis not present

## 2018-04-17 DIAGNOSIS — E039 Hypothyroidism, unspecified: Secondary | ICD-10-CM | POA: Diagnosis not present

## 2018-04-17 DIAGNOSIS — I890 Lymphedema, not elsewhere classified: Secondary | ICD-10-CM | POA: Diagnosis not present

## 2018-04-17 DIAGNOSIS — J449 Chronic obstructive pulmonary disease, unspecified: Secondary | ICD-10-CM | POA: Diagnosis not present

## 2018-04-17 DIAGNOSIS — I5031 Acute diastolic (congestive) heart failure: Secondary | ICD-10-CM | POA: Diagnosis not present

## 2018-04-17 DIAGNOSIS — I5030 Unspecified diastolic (congestive) heart failure: Secondary | ICD-10-CM | POA: Diagnosis not present

## 2018-04-17 DIAGNOSIS — Z9981 Dependence on supplemental oxygen: Secondary | ICD-10-CM | POA: Diagnosis not present

## 2018-04-17 DIAGNOSIS — R609 Edema, unspecified: Secondary | ICD-10-CM | POA: Diagnosis not present

## 2018-04-17 DIAGNOSIS — K5190 Ulcerative colitis, unspecified, without complications: Secondary | ICD-10-CM | POA: Diagnosis not present

## 2018-04-20 DIAGNOSIS — I5031 Acute diastolic (congestive) heart failure: Secondary | ICD-10-CM | POA: Diagnosis not present

## 2018-04-20 DIAGNOSIS — Z9181 History of falling: Secondary | ICD-10-CM | POA: Diagnosis not present

## 2018-04-20 DIAGNOSIS — I471 Supraventricular tachycardia: Secondary | ICD-10-CM | POA: Diagnosis not present

## 2018-04-20 DIAGNOSIS — I890 Lymphedema, not elsewhere classified: Secondary | ICD-10-CM | POA: Diagnosis not present

## 2018-04-20 DIAGNOSIS — K5190 Ulcerative colitis, unspecified, without complications: Secondary | ICD-10-CM | POA: Diagnosis not present

## 2018-04-20 DIAGNOSIS — Z9981 Dependence on supplemental oxygen: Secondary | ICD-10-CM | POA: Diagnosis not present

## 2018-04-20 DIAGNOSIS — J9610 Chronic respiratory failure, unspecified whether with hypoxia or hypercapnia: Secondary | ICD-10-CM | POA: Diagnosis not present

## 2018-04-20 DIAGNOSIS — I951 Orthostatic hypotension: Secondary | ICD-10-CM | POA: Diagnosis not present

## 2018-04-20 DIAGNOSIS — J449 Chronic obstructive pulmonary disease, unspecified: Secondary | ICD-10-CM | POA: Diagnosis not present

## 2018-04-20 DIAGNOSIS — E039 Hypothyroidism, unspecified: Secondary | ICD-10-CM | POA: Diagnosis not present

## 2018-04-20 DIAGNOSIS — Z87891 Personal history of nicotine dependence: Secondary | ICD-10-CM | POA: Diagnosis not present

## 2018-04-21 DIAGNOSIS — D2371 Other benign neoplasm of skin of right lower limb, including hip: Secondary | ICD-10-CM | POA: Diagnosis not present

## 2018-04-21 DIAGNOSIS — L97511 Non-pressure chronic ulcer of other part of right foot limited to breakdown of skin: Secondary | ICD-10-CM | POA: Diagnosis not present

## 2018-04-23 DIAGNOSIS — J9610 Chronic respiratory failure, unspecified whether with hypoxia or hypercapnia: Secondary | ICD-10-CM | POA: Diagnosis not present

## 2018-04-23 DIAGNOSIS — I471 Supraventricular tachycardia: Secondary | ICD-10-CM | POA: Diagnosis not present

## 2018-04-23 DIAGNOSIS — K5190 Ulcerative colitis, unspecified, without complications: Secondary | ICD-10-CM | POA: Diagnosis not present

## 2018-04-23 DIAGNOSIS — I5031 Acute diastolic (congestive) heart failure: Secondary | ICD-10-CM | POA: Diagnosis not present

## 2018-04-23 DIAGNOSIS — Z9981 Dependence on supplemental oxygen: Secondary | ICD-10-CM | POA: Diagnosis not present

## 2018-04-23 DIAGNOSIS — J449 Chronic obstructive pulmonary disease, unspecified: Secondary | ICD-10-CM | POA: Diagnosis not present

## 2018-04-23 DIAGNOSIS — Z9181 History of falling: Secondary | ICD-10-CM | POA: Diagnosis not present

## 2018-04-23 DIAGNOSIS — I890 Lymphedema, not elsewhere classified: Secondary | ICD-10-CM | POA: Diagnosis not present

## 2018-04-23 DIAGNOSIS — I951 Orthostatic hypotension: Secondary | ICD-10-CM | POA: Diagnosis not present

## 2018-04-23 DIAGNOSIS — Z87891 Personal history of nicotine dependence: Secondary | ICD-10-CM | POA: Diagnosis not present

## 2018-04-23 DIAGNOSIS — E039 Hypothyroidism, unspecified: Secondary | ICD-10-CM | POA: Diagnosis not present

## 2018-04-25 DIAGNOSIS — I5030 Unspecified diastolic (congestive) heart failure: Secondary | ICD-10-CM | POA: Diagnosis not present

## 2018-04-27 DIAGNOSIS — I951 Orthostatic hypotension: Secondary | ICD-10-CM | POA: Diagnosis not present

## 2018-04-27 DIAGNOSIS — E039 Hypothyroidism, unspecified: Secondary | ICD-10-CM | POA: Diagnosis not present

## 2018-04-27 DIAGNOSIS — I5031 Acute diastolic (congestive) heart failure: Secondary | ICD-10-CM | POA: Diagnosis not present

## 2018-04-27 DIAGNOSIS — I890 Lymphedema, not elsewhere classified: Secondary | ICD-10-CM | POA: Diagnosis not present

## 2018-04-27 DIAGNOSIS — Z9181 History of falling: Secondary | ICD-10-CM | POA: Diagnosis not present

## 2018-04-27 DIAGNOSIS — Z9981 Dependence on supplemental oxygen: Secondary | ICD-10-CM | POA: Diagnosis not present

## 2018-04-27 DIAGNOSIS — J449 Chronic obstructive pulmonary disease, unspecified: Secondary | ICD-10-CM | POA: Diagnosis not present

## 2018-04-27 DIAGNOSIS — Z87891 Personal history of nicotine dependence: Secondary | ICD-10-CM | POA: Diagnosis not present

## 2018-04-27 DIAGNOSIS — K5190 Ulcerative colitis, unspecified, without complications: Secondary | ICD-10-CM | POA: Diagnosis not present

## 2018-04-27 DIAGNOSIS — J9610 Chronic respiratory failure, unspecified whether with hypoxia or hypercapnia: Secondary | ICD-10-CM | POA: Diagnosis not present

## 2018-04-27 DIAGNOSIS — I471 Supraventricular tachycardia: Secondary | ICD-10-CM | POA: Diagnosis not present

## 2018-04-28 DIAGNOSIS — Z87891 Personal history of nicotine dependence: Secondary | ICD-10-CM | POA: Diagnosis not present

## 2018-04-28 DIAGNOSIS — Z9181 History of falling: Secondary | ICD-10-CM | POA: Diagnosis not present

## 2018-04-28 DIAGNOSIS — E039 Hypothyroidism, unspecified: Secondary | ICD-10-CM | POA: Diagnosis not present

## 2018-04-28 DIAGNOSIS — Z9981 Dependence on supplemental oxygen: Secondary | ICD-10-CM | POA: Diagnosis not present

## 2018-04-28 DIAGNOSIS — I951 Orthostatic hypotension: Secondary | ICD-10-CM | POA: Diagnosis not present

## 2018-04-28 DIAGNOSIS — I5031 Acute diastolic (congestive) heart failure: Secondary | ICD-10-CM | POA: Diagnosis not present

## 2018-04-28 DIAGNOSIS — J9610 Chronic respiratory failure, unspecified whether with hypoxia or hypercapnia: Secondary | ICD-10-CM | POA: Diagnosis not present

## 2018-04-28 DIAGNOSIS — I890 Lymphedema, not elsewhere classified: Secondary | ICD-10-CM | POA: Diagnosis not present

## 2018-04-28 DIAGNOSIS — I471 Supraventricular tachycardia: Secondary | ICD-10-CM | POA: Diagnosis not present

## 2018-04-28 DIAGNOSIS — J449 Chronic obstructive pulmonary disease, unspecified: Secondary | ICD-10-CM | POA: Diagnosis not present

## 2018-04-28 DIAGNOSIS — K5190 Ulcerative colitis, unspecified, without complications: Secondary | ICD-10-CM | POA: Diagnosis not present

## 2018-05-01 DIAGNOSIS — J449 Chronic obstructive pulmonary disease, unspecified: Secondary | ICD-10-CM | POA: Diagnosis not present

## 2018-05-01 DIAGNOSIS — I5031 Acute diastolic (congestive) heart failure: Secondary | ICD-10-CM | POA: Diagnosis not present

## 2018-05-01 DIAGNOSIS — Z9181 History of falling: Secondary | ICD-10-CM | POA: Diagnosis not present

## 2018-05-01 DIAGNOSIS — E039 Hypothyroidism, unspecified: Secondary | ICD-10-CM | POA: Diagnosis not present

## 2018-05-01 DIAGNOSIS — K5190 Ulcerative colitis, unspecified, without complications: Secondary | ICD-10-CM | POA: Diagnosis not present

## 2018-05-01 DIAGNOSIS — Z9981 Dependence on supplemental oxygen: Secondary | ICD-10-CM | POA: Diagnosis not present

## 2018-05-01 DIAGNOSIS — I951 Orthostatic hypotension: Secondary | ICD-10-CM | POA: Diagnosis not present

## 2018-05-01 DIAGNOSIS — J9610 Chronic respiratory failure, unspecified whether with hypoxia or hypercapnia: Secondary | ICD-10-CM | POA: Diagnosis not present

## 2018-05-01 DIAGNOSIS — I890 Lymphedema, not elsewhere classified: Secondary | ICD-10-CM | POA: Diagnosis not present

## 2018-05-01 DIAGNOSIS — Z87891 Personal history of nicotine dependence: Secondary | ICD-10-CM | POA: Diagnosis not present

## 2018-05-01 DIAGNOSIS — I471 Supraventricular tachycardia: Secondary | ICD-10-CM | POA: Diagnosis not present

## 2018-05-04 DIAGNOSIS — Z87891 Personal history of nicotine dependence: Secondary | ICD-10-CM | POA: Diagnosis not present

## 2018-05-04 DIAGNOSIS — J449 Chronic obstructive pulmonary disease, unspecified: Secondary | ICD-10-CM | POA: Diagnosis not present

## 2018-05-04 DIAGNOSIS — Z9181 History of falling: Secondary | ICD-10-CM | POA: Diagnosis not present

## 2018-05-04 DIAGNOSIS — K5190 Ulcerative colitis, unspecified, without complications: Secondary | ICD-10-CM | POA: Diagnosis not present

## 2018-05-04 DIAGNOSIS — I951 Orthostatic hypotension: Secondary | ICD-10-CM | POA: Diagnosis not present

## 2018-05-04 DIAGNOSIS — I890 Lymphedema, not elsewhere classified: Secondary | ICD-10-CM | POA: Diagnosis not present

## 2018-05-04 DIAGNOSIS — J9610 Chronic respiratory failure, unspecified whether with hypoxia or hypercapnia: Secondary | ICD-10-CM | POA: Diagnosis not present

## 2018-05-04 DIAGNOSIS — Z9981 Dependence on supplemental oxygen: Secondary | ICD-10-CM | POA: Diagnosis not present

## 2018-05-04 DIAGNOSIS — I471 Supraventricular tachycardia: Secondary | ICD-10-CM | POA: Diagnosis not present

## 2018-05-04 DIAGNOSIS — I5031 Acute diastolic (congestive) heart failure: Secondary | ICD-10-CM | POA: Diagnosis not present

## 2018-05-04 DIAGNOSIS — E039 Hypothyroidism, unspecified: Secondary | ICD-10-CM | POA: Diagnosis not present

## 2018-05-07 DIAGNOSIS — Z87891 Personal history of nicotine dependence: Secondary | ICD-10-CM | POA: Diagnosis not present

## 2018-05-07 DIAGNOSIS — I890 Lymphedema, not elsewhere classified: Secondary | ICD-10-CM | POA: Diagnosis not present

## 2018-05-07 DIAGNOSIS — E039 Hypothyroidism, unspecified: Secondary | ICD-10-CM | POA: Diagnosis not present

## 2018-05-07 DIAGNOSIS — J449 Chronic obstructive pulmonary disease, unspecified: Secondary | ICD-10-CM | POA: Diagnosis not present

## 2018-05-07 DIAGNOSIS — J9610 Chronic respiratory failure, unspecified whether with hypoxia or hypercapnia: Secondary | ICD-10-CM | POA: Diagnosis not present

## 2018-05-07 DIAGNOSIS — Z9181 History of falling: Secondary | ICD-10-CM | POA: Diagnosis not present

## 2018-05-07 DIAGNOSIS — K5190 Ulcerative colitis, unspecified, without complications: Secondary | ICD-10-CM | POA: Diagnosis not present

## 2018-05-07 DIAGNOSIS — Z9981 Dependence on supplemental oxygen: Secondary | ICD-10-CM | POA: Diagnosis not present

## 2018-05-07 DIAGNOSIS — I951 Orthostatic hypotension: Secondary | ICD-10-CM | POA: Diagnosis not present

## 2018-05-07 DIAGNOSIS — I5031 Acute diastolic (congestive) heart failure: Secondary | ICD-10-CM | POA: Diagnosis not present

## 2018-05-07 DIAGNOSIS — I471 Supraventricular tachycardia: Secondary | ICD-10-CM | POA: Diagnosis not present

## 2018-05-11 DIAGNOSIS — E039 Hypothyroidism, unspecified: Secondary | ICD-10-CM | POA: Diagnosis not present

## 2018-05-11 DIAGNOSIS — I890 Lymphedema, not elsewhere classified: Secondary | ICD-10-CM | POA: Diagnosis not present

## 2018-05-11 DIAGNOSIS — Z87891 Personal history of nicotine dependence: Secondary | ICD-10-CM | POA: Diagnosis not present

## 2018-05-11 DIAGNOSIS — Z9981 Dependence on supplemental oxygen: Secondary | ICD-10-CM | POA: Diagnosis not present

## 2018-05-11 DIAGNOSIS — I5031 Acute diastolic (congestive) heart failure: Secondary | ICD-10-CM | POA: Diagnosis not present

## 2018-05-11 DIAGNOSIS — J9610 Chronic respiratory failure, unspecified whether with hypoxia or hypercapnia: Secondary | ICD-10-CM | POA: Diagnosis not present

## 2018-05-11 DIAGNOSIS — K5190 Ulcerative colitis, unspecified, without complications: Secondary | ICD-10-CM | POA: Diagnosis not present

## 2018-05-11 DIAGNOSIS — I951 Orthostatic hypotension: Secondary | ICD-10-CM | POA: Diagnosis not present

## 2018-05-11 DIAGNOSIS — J449 Chronic obstructive pulmonary disease, unspecified: Secondary | ICD-10-CM | POA: Diagnosis not present

## 2018-05-11 DIAGNOSIS — Z9181 History of falling: Secondary | ICD-10-CM | POA: Diagnosis not present

## 2018-05-11 DIAGNOSIS — I471 Supraventricular tachycardia: Secondary | ICD-10-CM | POA: Diagnosis not present

## 2018-05-12 DIAGNOSIS — E039 Hypothyroidism, unspecified: Secondary | ICD-10-CM | POA: Diagnosis not present

## 2018-05-12 DIAGNOSIS — K5190 Ulcerative colitis, unspecified, without complications: Secondary | ICD-10-CM | POA: Diagnosis not present

## 2018-05-12 DIAGNOSIS — I951 Orthostatic hypotension: Secondary | ICD-10-CM | POA: Diagnosis not present

## 2018-05-12 DIAGNOSIS — J9610 Chronic respiratory failure, unspecified whether with hypoxia or hypercapnia: Secondary | ICD-10-CM | POA: Diagnosis not present

## 2018-05-12 DIAGNOSIS — I5031 Acute diastolic (congestive) heart failure: Secondary | ICD-10-CM | POA: Diagnosis not present

## 2018-05-12 DIAGNOSIS — Z9181 History of falling: Secondary | ICD-10-CM | POA: Diagnosis not present

## 2018-05-12 DIAGNOSIS — Z87891 Personal history of nicotine dependence: Secondary | ICD-10-CM | POA: Diagnosis not present

## 2018-05-12 DIAGNOSIS — J449 Chronic obstructive pulmonary disease, unspecified: Secondary | ICD-10-CM | POA: Diagnosis not present

## 2018-05-12 DIAGNOSIS — I890 Lymphedema, not elsewhere classified: Secondary | ICD-10-CM | POA: Diagnosis not present

## 2018-05-12 DIAGNOSIS — I471 Supraventricular tachycardia: Secondary | ICD-10-CM | POA: Diagnosis not present

## 2018-05-12 DIAGNOSIS — Z9981 Dependence on supplemental oxygen: Secondary | ICD-10-CM | POA: Diagnosis not present

## 2018-05-15 DIAGNOSIS — K5190 Ulcerative colitis, unspecified, without complications: Secondary | ICD-10-CM | POA: Diagnosis not present

## 2018-05-15 DIAGNOSIS — Z9981 Dependence on supplemental oxygen: Secondary | ICD-10-CM | POA: Diagnosis not present

## 2018-05-15 DIAGNOSIS — Z9181 History of falling: Secondary | ICD-10-CM | POA: Diagnosis not present

## 2018-05-15 DIAGNOSIS — J449 Chronic obstructive pulmonary disease, unspecified: Secondary | ICD-10-CM | POA: Diagnosis not present

## 2018-05-15 DIAGNOSIS — E039 Hypothyroidism, unspecified: Secondary | ICD-10-CM | POA: Diagnosis not present

## 2018-05-15 DIAGNOSIS — I951 Orthostatic hypotension: Secondary | ICD-10-CM | POA: Diagnosis not present

## 2018-05-15 DIAGNOSIS — I471 Supraventricular tachycardia: Secondary | ICD-10-CM | POA: Diagnosis not present

## 2018-05-15 DIAGNOSIS — I5031 Acute diastolic (congestive) heart failure: Secondary | ICD-10-CM | POA: Diagnosis not present

## 2018-05-15 DIAGNOSIS — I890 Lymphedema, not elsewhere classified: Secondary | ICD-10-CM | POA: Diagnosis not present

## 2018-05-15 DIAGNOSIS — J9610 Chronic respiratory failure, unspecified whether with hypoxia or hypercapnia: Secondary | ICD-10-CM | POA: Diagnosis not present

## 2018-05-15 DIAGNOSIS — Z87891 Personal history of nicotine dependence: Secondary | ICD-10-CM | POA: Diagnosis not present

## 2018-05-18 DIAGNOSIS — E039 Hypothyroidism, unspecified: Secondary | ICD-10-CM | POA: Diagnosis not present

## 2018-05-18 DIAGNOSIS — J449 Chronic obstructive pulmonary disease, unspecified: Secondary | ICD-10-CM | POA: Diagnosis not present

## 2018-05-18 DIAGNOSIS — K5190 Ulcerative colitis, unspecified, without complications: Secondary | ICD-10-CM | POA: Diagnosis not present

## 2018-05-18 DIAGNOSIS — J9610 Chronic respiratory failure, unspecified whether with hypoxia or hypercapnia: Secondary | ICD-10-CM | POA: Diagnosis not present

## 2018-05-18 DIAGNOSIS — I5031 Acute diastolic (congestive) heart failure: Secondary | ICD-10-CM | POA: Diagnosis not present

## 2018-05-18 DIAGNOSIS — I471 Supraventricular tachycardia: Secondary | ICD-10-CM | POA: Diagnosis not present

## 2018-05-18 DIAGNOSIS — Z9981 Dependence on supplemental oxygen: Secondary | ICD-10-CM | POA: Diagnosis not present

## 2018-05-18 DIAGNOSIS — I951 Orthostatic hypotension: Secondary | ICD-10-CM | POA: Diagnosis not present

## 2018-05-18 DIAGNOSIS — Z87891 Personal history of nicotine dependence: Secondary | ICD-10-CM | POA: Diagnosis not present

## 2018-05-18 DIAGNOSIS — I890 Lymphedema, not elsewhere classified: Secondary | ICD-10-CM | POA: Diagnosis not present

## 2018-05-18 DIAGNOSIS — Z9181 History of falling: Secondary | ICD-10-CM | POA: Diagnosis not present

## 2018-05-21 DIAGNOSIS — I471 Supraventricular tachycardia: Secondary | ICD-10-CM | POA: Diagnosis not present

## 2018-05-21 DIAGNOSIS — I890 Lymphedema, not elsewhere classified: Secondary | ICD-10-CM | POA: Diagnosis not present

## 2018-05-21 DIAGNOSIS — K5190 Ulcerative colitis, unspecified, without complications: Secondary | ICD-10-CM | POA: Diagnosis not present

## 2018-05-21 DIAGNOSIS — E039 Hypothyroidism, unspecified: Secondary | ICD-10-CM | POA: Diagnosis not present

## 2018-05-21 DIAGNOSIS — I951 Orthostatic hypotension: Secondary | ICD-10-CM | POA: Diagnosis not present

## 2018-05-21 DIAGNOSIS — J449 Chronic obstructive pulmonary disease, unspecified: Secondary | ICD-10-CM | POA: Diagnosis not present

## 2018-05-21 DIAGNOSIS — Z9181 History of falling: Secondary | ICD-10-CM | POA: Diagnosis not present

## 2018-05-21 DIAGNOSIS — Z9981 Dependence on supplemental oxygen: Secondary | ICD-10-CM | POA: Diagnosis not present

## 2018-05-21 DIAGNOSIS — I5031 Acute diastolic (congestive) heart failure: Secondary | ICD-10-CM | POA: Diagnosis not present

## 2018-05-21 DIAGNOSIS — Z87891 Personal history of nicotine dependence: Secondary | ICD-10-CM | POA: Diagnosis not present

## 2018-05-21 DIAGNOSIS — J9610 Chronic respiratory failure, unspecified whether with hypoxia or hypercapnia: Secondary | ICD-10-CM | POA: Diagnosis not present

## 2018-05-25 DIAGNOSIS — Z9981 Dependence on supplemental oxygen: Secondary | ICD-10-CM | POA: Diagnosis not present

## 2018-05-25 DIAGNOSIS — I951 Orthostatic hypotension: Secondary | ICD-10-CM | POA: Diagnosis not present

## 2018-05-25 DIAGNOSIS — I5031 Acute diastolic (congestive) heart failure: Secondary | ICD-10-CM | POA: Diagnosis not present

## 2018-05-25 DIAGNOSIS — J449 Chronic obstructive pulmonary disease, unspecified: Secondary | ICD-10-CM | POA: Diagnosis not present

## 2018-05-25 DIAGNOSIS — Z9181 History of falling: Secondary | ICD-10-CM | POA: Diagnosis not present

## 2018-05-25 DIAGNOSIS — J9610 Chronic respiratory failure, unspecified whether with hypoxia or hypercapnia: Secondary | ICD-10-CM | POA: Diagnosis not present

## 2018-05-25 DIAGNOSIS — I5030 Unspecified diastolic (congestive) heart failure: Secondary | ICD-10-CM | POA: Diagnosis not present

## 2018-05-25 DIAGNOSIS — I471 Supraventricular tachycardia: Secondary | ICD-10-CM | POA: Diagnosis not present

## 2018-05-25 DIAGNOSIS — K5190 Ulcerative colitis, unspecified, without complications: Secondary | ICD-10-CM | POA: Diagnosis not present

## 2018-05-25 DIAGNOSIS — I890 Lymphedema, not elsewhere classified: Secondary | ICD-10-CM | POA: Diagnosis not present

## 2018-05-25 DIAGNOSIS — Z87891 Personal history of nicotine dependence: Secondary | ICD-10-CM | POA: Diagnosis not present

## 2018-05-25 DIAGNOSIS — E039 Hypothyroidism, unspecified: Secondary | ICD-10-CM | POA: Diagnosis not present

## 2018-05-27 DIAGNOSIS — I890 Lymphedema, not elsewhere classified: Secondary | ICD-10-CM | POA: Diagnosis not present

## 2018-05-27 DIAGNOSIS — J449 Chronic obstructive pulmonary disease, unspecified: Secondary | ICD-10-CM | POA: Diagnosis not present

## 2018-05-27 DIAGNOSIS — I5031 Acute diastolic (congestive) heart failure: Secondary | ICD-10-CM | POA: Diagnosis not present

## 2018-05-29 DIAGNOSIS — K5190 Ulcerative colitis, unspecified, without complications: Secondary | ICD-10-CM | POA: Diagnosis not present

## 2018-05-29 DIAGNOSIS — Z9181 History of falling: Secondary | ICD-10-CM | POA: Diagnosis not present

## 2018-05-29 DIAGNOSIS — E039 Hypothyroidism, unspecified: Secondary | ICD-10-CM | POA: Diagnosis not present

## 2018-05-29 DIAGNOSIS — Z9981 Dependence on supplemental oxygen: Secondary | ICD-10-CM | POA: Diagnosis not present

## 2018-05-29 DIAGNOSIS — J9610 Chronic respiratory failure, unspecified whether with hypoxia or hypercapnia: Secondary | ICD-10-CM | POA: Diagnosis not present

## 2018-05-29 DIAGNOSIS — J449 Chronic obstructive pulmonary disease, unspecified: Secondary | ICD-10-CM | POA: Diagnosis not present

## 2018-05-29 DIAGNOSIS — I951 Orthostatic hypotension: Secondary | ICD-10-CM | POA: Diagnosis not present

## 2018-05-29 DIAGNOSIS — I890 Lymphedema, not elsewhere classified: Secondary | ICD-10-CM | POA: Diagnosis not present

## 2018-05-29 DIAGNOSIS — I5031 Acute diastolic (congestive) heart failure: Secondary | ICD-10-CM | POA: Diagnosis not present

## 2018-05-29 DIAGNOSIS — Z87891 Personal history of nicotine dependence: Secondary | ICD-10-CM | POA: Diagnosis not present

## 2018-05-29 DIAGNOSIS — I471 Supraventricular tachycardia: Secondary | ICD-10-CM | POA: Diagnosis not present

## 2018-06-01 DIAGNOSIS — Z87891 Personal history of nicotine dependence: Secondary | ICD-10-CM | POA: Diagnosis not present

## 2018-06-01 DIAGNOSIS — I471 Supraventricular tachycardia: Secondary | ICD-10-CM | POA: Diagnosis not present

## 2018-06-01 DIAGNOSIS — I890 Lymphedema, not elsewhere classified: Secondary | ICD-10-CM | POA: Diagnosis not present

## 2018-06-01 DIAGNOSIS — J9610 Chronic respiratory failure, unspecified whether with hypoxia or hypercapnia: Secondary | ICD-10-CM | POA: Diagnosis not present

## 2018-06-01 DIAGNOSIS — E039 Hypothyroidism, unspecified: Secondary | ICD-10-CM | POA: Diagnosis not present

## 2018-06-01 DIAGNOSIS — Z9981 Dependence on supplemental oxygen: Secondary | ICD-10-CM | POA: Diagnosis not present

## 2018-06-01 DIAGNOSIS — J449 Chronic obstructive pulmonary disease, unspecified: Secondary | ICD-10-CM | POA: Diagnosis not present

## 2018-06-01 DIAGNOSIS — K5190 Ulcerative colitis, unspecified, without complications: Secondary | ICD-10-CM | POA: Diagnosis not present

## 2018-06-01 DIAGNOSIS — I951 Orthostatic hypotension: Secondary | ICD-10-CM | POA: Diagnosis not present

## 2018-06-01 DIAGNOSIS — Z9181 History of falling: Secondary | ICD-10-CM | POA: Diagnosis not present

## 2018-06-01 DIAGNOSIS — I5031 Acute diastolic (congestive) heart failure: Secondary | ICD-10-CM | POA: Diagnosis not present

## 2018-06-04 DIAGNOSIS — I951 Orthostatic hypotension: Secondary | ICD-10-CM | POA: Diagnosis not present

## 2018-06-04 DIAGNOSIS — Z87891 Personal history of nicotine dependence: Secondary | ICD-10-CM | POA: Diagnosis not present

## 2018-06-04 DIAGNOSIS — I471 Supraventricular tachycardia: Secondary | ICD-10-CM | POA: Diagnosis not present

## 2018-06-04 DIAGNOSIS — K5190 Ulcerative colitis, unspecified, without complications: Secondary | ICD-10-CM | POA: Diagnosis not present

## 2018-06-04 DIAGNOSIS — J9610 Chronic respiratory failure, unspecified whether with hypoxia or hypercapnia: Secondary | ICD-10-CM | POA: Diagnosis not present

## 2018-06-04 DIAGNOSIS — I890 Lymphedema, not elsewhere classified: Secondary | ICD-10-CM | POA: Diagnosis not present

## 2018-06-04 DIAGNOSIS — E039 Hypothyroidism, unspecified: Secondary | ICD-10-CM | POA: Diagnosis not present

## 2018-06-04 DIAGNOSIS — Z9181 History of falling: Secondary | ICD-10-CM | POA: Diagnosis not present

## 2018-06-04 DIAGNOSIS — I5031 Acute diastolic (congestive) heart failure: Secondary | ICD-10-CM | POA: Diagnosis not present

## 2018-06-04 DIAGNOSIS — J449 Chronic obstructive pulmonary disease, unspecified: Secondary | ICD-10-CM | POA: Diagnosis not present

## 2018-06-04 DIAGNOSIS — Z9981 Dependence on supplemental oxygen: Secondary | ICD-10-CM | POA: Diagnosis not present

## 2018-06-08 DIAGNOSIS — Z9981 Dependence on supplemental oxygen: Secondary | ICD-10-CM | POA: Diagnosis not present

## 2018-06-08 DIAGNOSIS — K5190 Ulcerative colitis, unspecified, without complications: Secondary | ICD-10-CM | POA: Diagnosis not present

## 2018-06-08 DIAGNOSIS — I5031 Acute diastolic (congestive) heart failure: Secondary | ICD-10-CM | POA: Diagnosis not present

## 2018-06-08 DIAGNOSIS — I951 Orthostatic hypotension: Secondary | ICD-10-CM | POA: Diagnosis not present

## 2018-06-08 DIAGNOSIS — Z9181 History of falling: Secondary | ICD-10-CM | POA: Diagnosis not present

## 2018-06-08 DIAGNOSIS — I471 Supraventricular tachycardia: Secondary | ICD-10-CM | POA: Diagnosis not present

## 2018-06-08 DIAGNOSIS — E039 Hypothyroidism, unspecified: Secondary | ICD-10-CM | POA: Diagnosis not present

## 2018-06-08 DIAGNOSIS — J449 Chronic obstructive pulmonary disease, unspecified: Secondary | ICD-10-CM | POA: Diagnosis not present

## 2018-06-08 DIAGNOSIS — Z87891 Personal history of nicotine dependence: Secondary | ICD-10-CM | POA: Diagnosis not present

## 2018-06-08 DIAGNOSIS — J9610 Chronic respiratory failure, unspecified whether with hypoxia or hypercapnia: Secondary | ICD-10-CM | POA: Diagnosis not present

## 2018-06-08 DIAGNOSIS — I890 Lymphedema, not elsewhere classified: Secondary | ICD-10-CM | POA: Diagnosis not present

## 2018-06-12 DIAGNOSIS — K5190 Ulcerative colitis, unspecified, without complications: Secondary | ICD-10-CM | POA: Diagnosis not present

## 2018-06-12 DIAGNOSIS — Z9981 Dependence on supplemental oxygen: Secondary | ICD-10-CM | POA: Diagnosis not present

## 2018-06-12 DIAGNOSIS — I5031 Acute diastolic (congestive) heart failure: Secondary | ICD-10-CM | POA: Diagnosis not present

## 2018-06-12 DIAGNOSIS — J449 Chronic obstructive pulmonary disease, unspecified: Secondary | ICD-10-CM | POA: Diagnosis not present

## 2018-06-12 DIAGNOSIS — I951 Orthostatic hypotension: Secondary | ICD-10-CM | POA: Diagnosis not present

## 2018-06-12 DIAGNOSIS — I890 Lymphedema, not elsewhere classified: Secondary | ICD-10-CM | POA: Diagnosis not present

## 2018-06-12 DIAGNOSIS — I471 Supraventricular tachycardia: Secondary | ICD-10-CM | POA: Diagnosis not present

## 2018-06-12 DIAGNOSIS — Z87891 Personal history of nicotine dependence: Secondary | ICD-10-CM | POA: Diagnosis not present

## 2018-06-12 DIAGNOSIS — E039 Hypothyroidism, unspecified: Secondary | ICD-10-CM | POA: Diagnosis not present

## 2018-06-12 DIAGNOSIS — J9610 Chronic respiratory failure, unspecified whether with hypoxia or hypercapnia: Secondary | ICD-10-CM | POA: Diagnosis not present

## 2018-06-12 DIAGNOSIS — Z9181 History of falling: Secondary | ICD-10-CM | POA: Diagnosis not present

## 2018-06-15 DIAGNOSIS — I890 Lymphedema, not elsewhere classified: Secondary | ICD-10-CM | POA: Diagnosis not present

## 2018-06-15 DIAGNOSIS — I951 Orthostatic hypotension: Secondary | ICD-10-CM | POA: Diagnosis not present

## 2018-06-15 DIAGNOSIS — K5190 Ulcerative colitis, unspecified, without complications: Secondary | ICD-10-CM | POA: Diagnosis not present

## 2018-06-15 DIAGNOSIS — Z87891 Personal history of nicotine dependence: Secondary | ICD-10-CM | POA: Diagnosis not present

## 2018-06-15 DIAGNOSIS — R2689 Other abnormalities of gait and mobility: Secondary | ICD-10-CM | POA: Diagnosis not present

## 2018-06-15 DIAGNOSIS — J449 Chronic obstructive pulmonary disease, unspecified: Secondary | ICD-10-CM | POA: Diagnosis not present

## 2018-06-15 DIAGNOSIS — Z9981 Dependence on supplemental oxygen: Secondary | ICD-10-CM | POA: Diagnosis not present

## 2018-06-15 DIAGNOSIS — I471 Supraventricular tachycardia: Secondary | ICD-10-CM | POA: Diagnosis not present

## 2018-06-15 DIAGNOSIS — I5031 Acute diastolic (congestive) heart failure: Secondary | ICD-10-CM | POA: Diagnosis not present

## 2018-06-15 DIAGNOSIS — E039 Hypothyroidism, unspecified: Secondary | ICD-10-CM | POA: Diagnosis not present

## 2018-06-15 DIAGNOSIS — Z9181 History of falling: Secondary | ICD-10-CM | POA: Diagnosis not present

## 2018-06-15 DIAGNOSIS — J9610 Chronic respiratory failure, unspecified whether with hypoxia or hypercapnia: Secondary | ICD-10-CM | POA: Diagnosis not present

## 2018-06-19 DIAGNOSIS — R2689 Other abnormalities of gait and mobility: Secondary | ICD-10-CM | POA: Diagnosis not present

## 2018-06-19 DIAGNOSIS — I471 Supraventricular tachycardia: Secondary | ICD-10-CM | POA: Diagnosis not present

## 2018-06-19 DIAGNOSIS — Z9981 Dependence on supplemental oxygen: Secondary | ICD-10-CM | POA: Diagnosis not present

## 2018-06-19 DIAGNOSIS — I5031 Acute diastolic (congestive) heart failure: Secondary | ICD-10-CM | POA: Diagnosis not present

## 2018-06-19 DIAGNOSIS — Z87891 Personal history of nicotine dependence: Secondary | ICD-10-CM | POA: Diagnosis not present

## 2018-06-19 DIAGNOSIS — Z9181 History of falling: Secondary | ICD-10-CM | POA: Diagnosis not present

## 2018-06-19 DIAGNOSIS — I890 Lymphedema, not elsewhere classified: Secondary | ICD-10-CM | POA: Diagnosis not present

## 2018-06-19 DIAGNOSIS — J449 Chronic obstructive pulmonary disease, unspecified: Secondary | ICD-10-CM | POA: Diagnosis not present

## 2018-06-19 DIAGNOSIS — J9610 Chronic respiratory failure, unspecified whether with hypoxia or hypercapnia: Secondary | ICD-10-CM | POA: Diagnosis not present

## 2018-06-19 DIAGNOSIS — K5190 Ulcerative colitis, unspecified, without complications: Secondary | ICD-10-CM | POA: Diagnosis not present

## 2018-06-19 DIAGNOSIS — E039 Hypothyroidism, unspecified: Secondary | ICD-10-CM | POA: Diagnosis not present

## 2018-06-19 DIAGNOSIS — I951 Orthostatic hypotension: Secondary | ICD-10-CM | POA: Diagnosis not present

## 2018-06-22 DIAGNOSIS — I5031 Acute diastolic (congestive) heart failure: Secondary | ICD-10-CM | POA: Diagnosis not present

## 2018-06-22 DIAGNOSIS — R2689 Other abnormalities of gait and mobility: Secondary | ICD-10-CM | POA: Diagnosis not present

## 2018-06-22 DIAGNOSIS — I890 Lymphedema, not elsewhere classified: Secondary | ICD-10-CM | POA: Diagnosis not present

## 2018-06-22 DIAGNOSIS — Z9981 Dependence on supplemental oxygen: Secondary | ICD-10-CM | POA: Diagnosis not present

## 2018-06-22 DIAGNOSIS — I951 Orthostatic hypotension: Secondary | ICD-10-CM | POA: Diagnosis not present

## 2018-06-22 DIAGNOSIS — K5190 Ulcerative colitis, unspecified, without complications: Secondary | ICD-10-CM | POA: Diagnosis not present

## 2018-06-22 DIAGNOSIS — I471 Supraventricular tachycardia: Secondary | ICD-10-CM | POA: Diagnosis not present

## 2018-06-22 DIAGNOSIS — J9610 Chronic respiratory failure, unspecified whether with hypoxia or hypercapnia: Secondary | ICD-10-CM | POA: Diagnosis not present

## 2018-06-22 DIAGNOSIS — Z87891 Personal history of nicotine dependence: Secondary | ICD-10-CM | POA: Diagnosis not present

## 2018-06-22 DIAGNOSIS — E039 Hypothyroidism, unspecified: Secondary | ICD-10-CM | POA: Diagnosis not present

## 2018-06-22 DIAGNOSIS — J449 Chronic obstructive pulmonary disease, unspecified: Secondary | ICD-10-CM | POA: Diagnosis not present

## 2018-06-22 DIAGNOSIS — Z9181 History of falling: Secondary | ICD-10-CM | POA: Diagnosis not present

## 2018-06-25 DIAGNOSIS — I5030 Unspecified diastolic (congestive) heart failure: Secondary | ICD-10-CM | POA: Diagnosis not present

## 2018-06-26 DIAGNOSIS — E039 Hypothyroidism, unspecified: Secondary | ICD-10-CM | POA: Diagnosis not present

## 2018-06-26 DIAGNOSIS — Z9981 Dependence on supplemental oxygen: Secondary | ICD-10-CM | POA: Diagnosis not present

## 2018-06-26 DIAGNOSIS — I5031 Acute diastolic (congestive) heart failure: Secondary | ICD-10-CM | POA: Diagnosis not present

## 2018-06-26 DIAGNOSIS — Z9181 History of falling: Secondary | ICD-10-CM | POA: Diagnosis not present

## 2018-06-26 DIAGNOSIS — I951 Orthostatic hypotension: Secondary | ICD-10-CM | POA: Diagnosis not present

## 2018-06-26 DIAGNOSIS — R2689 Other abnormalities of gait and mobility: Secondary | ICD-10-CM | POA: Diagnosis not present

## 2018-06-26 DIAGNOSIS — J9610 Chronic respiratory failure, unspecified whether with hypoxia or hypercapnia: Secondary | ICD-10-CM | POA: Diagnosis not present

## 2018-06-26 DIAGNOSIS — I890 Lymphedema, not elsewhere classified: Secondary | ICD-10-CM | POA: Diagnosis not present

## 2018-06-26 DIAGNOSIS — K5190 Ulcerative colitis, unspecified, without complications: Secondary | ICD-10-CM | POA: Diagnosis not present

## 2018-06-26 DIAGNOSIS — Z87891 Personal history of nicotine dependence: Secondary | ICD-10-CM | POA: Diagnosis not present

## 2018-06-26 DIAGNOSIS — I471 Supraventricular tachycardia: Secondary | ICD-10-CM | POA: Diagnosis not present

## 2018-06-26 DIAGNOSIS — J449 Chronic obstructive pulmonary disease, unspecified: Secondary | ICD-10-CM | POA: Diagnosis not present

## 2018-06-30 DIAGNOSIS — I951 Orthostatic hypotension: Secondary | ICD-10-CM | POA: Diagnosis not present

## 2018-06-30 DIAGNOSIS — I5031 Acute diastolic (congestive) heart failure: Secondary | ICD-10-CM | POA: Diagnosis not present

## 2018-06-30 DIAGNOSIS — K5190 Ulcerative colitis, unspecified, without complications: Secondary | ICD-10-CM | POA: Diagnosis not present

## 2018-06-30 DIAGNOSIS — Z87891 Personal history of nicotine dependence: Secondary | ICD-10-CM | POA: Diagnosis not present

## 2018-06-30 DIAGNOSIS — E039 Hypothyroidism, unspecified: Secondary | ICD-10-CM | POA: Diagnosis not present

## 2018-06-30 DIAGNOSIS — R2689 Other abnormalities of gait and mobility: Secondary | ICD-10-CM | POA: Diagnosis not present

## 2018-06-30 DIAGNOSIS — J449 Chronic obstructive pulmonary disease, unspecified: Secondary | ICD-10-CM | POA: Diagnosis not present

## 2018-06-30 DIAGNOSIS — J9610 Chronic respiratory failure, unspecified whether with hypoxia or hypercapnia: Secondary | ICD-10-CM | POA: Diagnosis not present

## 2018-06-30 DIAGNOSIS — I890 Lymphedema, not elsewhere classified: Secondary | ICD-10-CM | POA: Diagnosis not present

## 2018-06-30 DIAGNOSIS — I471 Supraventricular tachycardia: Secondary | ICD-10-CM | POA: Diagnosis not present

## 2018-06-30 DIAGNOSIS — Z79899 Other long term (current) drug therapy: Secondary | ICD-10-CM | POA: Diagnosis not present

## 2018-06-30 DIAGNOSIS — Z9181 History of falling: Secondary | ICD-10-CM | POA: Diagnosis not present

## 2018-06-30 DIAGNOSIS — Z9981 Dependence on supplemental oxygen: Secondary | ICD-10-CM | POA: Diagnosis not present

## 2018-07-02 DIAGNOSIS — J9610 Chronic respiratory failure, unspecified whether with hypoxia or hypercapnia: Secondary | ICD-10-CM | POA: Diagnosis not present

## 2018-07-02 DIAGNOSIS — R2689 Other abnormalities of gait and mobility: Secondary | ICD-10-CM | POA: Diagnosis not present

## 2018-07-02 DIAGNOSIS — I5031 Acute diastolic (congestive) heart failure: Secondary | ICD-10-CM | POA: Diagnosis not present

## 2018-07-02 DIAGNOSIS — E039 Hypothyroidism, unspecified: Secondary | ICD-10-CM | POA: Diagnosis not present

## 2018-07-02 DIAGNOSIS — Z9181 History of falling: Secondary | ICD-10-CM | POA: Diagnosis not present

## 2018-07-02 DIAGNOSIS — Z9981 Dependence on supplemental oxygen: Secondary | ICD-10-CM | POA: Diagnosis not present

## 2018-07-02 DIAGNOSIS — I890 Lymphedema, not elsewhere classified: Secondary | ICD-10-CM | POA: Diagnosis not present

## 2018-07-02 DIAGNOSIS — I951 Orthostatic hypotension: Secondary | ICD-10-CM | POA: Diagnosis not present

## 2018-07-02 DIAGNOSIS — K5190 Ulcerative colitis, unspecified, without complications: Secondary | ICD-10-CM | POA: Diagnosis not present

## 2018-07-02 DIAGNOSIS — I471 Supraventricular tachycardia: Secondary | ICD-10-CM | POA: Diagnosis not present

## 2018-07-02 DIAGNOSIS — Z87891 Personal history of nicotine dependence: Secondary | ICD-10-CM | POA: Diagnosis not present

## 2018-07-02 DIAGNOSIS — J449 Chronic obstructive pulmonary disease, unspecified: Secondary | ICD-10-CM | POA: Diagnosis not present

## 2018-07-09 DIAGNOSIS — I890 Lymphedema, not elsewhere classified: Secondary | ICD-10-CM | POA: Diagnosis not present

## 2018-07-09 DIAGNOSIS — Z9181 History of falling: Secondary | ICD-10-CM | POA: Diagnosis not present

## 2018-07-09 DIAGNOSIS — I5031 Acute diastolic (congestive) heart failure: Secondary | ICD-10-CM | POA: Diagnosis not present

## 2018-07-09 DIAGNOSIS — I471 Supraventricular tachycardia: Secondary | ICD-10-CM | POA: Diagnosis not present

## 2018-07-09 DIAGNOSIS — Z87891 Personal history of nicotine dependence: Secondary | ICD-10-CM | POA: Diagnosis not present

## 2018-07-09 DIAGNOSIS — K5190 Ulcerative colitis, unspecified, without complications: Secondary | ICD-10-CM | POA: Diagnosis not present

## 2018-07-09 DIAGNOSIS — I951 Orthostatic hypotension: Secondary | ICD-10-CM | POA: Diagnosis not present

## 2018-07-09 DIAGNOSIS — R2689 Other abnormalities of gait and mobility: Secondary | ICD-10-CM | POA: Diagnosis not present

## 2018-07-09 DIAGNOSIS — E039 Hypothyroidism, unspecified: Secondary | ICD-10-CM | POA: Diagnosis not present

## 2018-07-09 DIAGNOSIS — J9610 Chronic respiratory failure, unspecified whether with hypoxia or hypercapnia: Secondary | ICD-10-CM | POA: Diagnosis not present

## 2018-07-09 DIAGNOSIS — Z9981 Dependence on supplemental oxygen: Secondary | ICD-10-CM | POA: Diagnosis not present

## 2018-07-09 DIAGNOSIS — J449 Chronic obstructive pulmonary disease, unspecified: Secondary | ICD-10-CM | POA: Diagnosis not present

## 2018-07-10 DIAGNOSIS — J449 Chronic obstructive pulmonary disease, unspecified: Secondary | ICD-10-CM | POA: Diagnosis not present

## 2018-07-10 DIAGNOSIS — I471 Supraventricular tachycardia: Secondary | ICD-10-CM | POA: Diagnosis not present

## 2018-07-10 DIAGNOSIS — I890 Lymphedema, not elsewhere classified: Secondary | ICD-10-CM | POA: Diagnosis not present

## 2018-07-10 DIAGNOSIS — E039 Hypothyroidism, unspecified: Secondary | ICD-10-CM | POA: Diagnosis not present

## 2018-07-10 DIAGNOSIS — Z87891 Personal history of nicotine dependence: Secondary | ICD-10-CM | POA: Diagnosis not present

## 2018-07-10 DIAGNOSIS — J9610 Chronic respiratory failure, unspecified whether with hypoxia or hypercapnia: Secondary | ICD-10-CM | POA: Diagnosis not present

## 2018-07-10 DIAGNOSIS — I951 Orthostatic hypotension: Secondary | ICD-10-CM | POA: Diagnosis not present

## 2018-07-10 DIAGNOSIS — Z9181 History of falling: Secondary | ICD-10-CM | POA: Diagnosis not present

## 2018-07-10 DIAGNOSIS — R2689 Other abnormalities of gait and mobility: Secondary | ICD-10-CM | POA: Diagnosis not present

## 2018-07-10 DIAGNOSIS — K5190 Ulcerative colitis, unspecified, without complications: Secondary | ICD-10-CM | POA: Diagnosis not present

## 2018-07-10 DIAGNOSIS — Z9981 Dependence on supplemental oxygen: Secondary | ICD-10-CM | POA: Diagnosis not present

## 2018-07-10 DIAGNOSIS — I5031 Acute diastolic (congestive) heart failure: Secondary | ICD-10-CM | POA: Diagnosis not present

## 2018-07-13 DIAGNOSIS — R2689 Other abnormalities of gait and mobility: Secondary | ICD-10-CM | POA: Diagnosis not present

## 2018-07-13 DIAGNOSIS — I5031 Acute diastolic (congestive) heart failure: Secondary | ICD-10-CM | POA: Diagnosis not present

## 2018-07-13 DIAGNOSIS — J9610 Chronic respiratory failure, unspecified whether with hypoxia or hypercapnia: Secondary | ICD-10-CM | POA: Diagnosis not present

## 2018-07-13 DIAGNOSIS — Z9981 Dependence on supplemental oxygen: Secondary | ICD-10-CM | POA: Diagnosis not present

## 2018-07-13 DIAGNOSIS — I951 Orthostatic hypotension: Secondary | ICD-10-CM | POA: Diagnosis not present

## 2018-07-13 DIAGNOSIS — Z87891 Personal history of nicotine dependence: Secondary | ICD-10-CM | POA: Diagnosis not present

## 2018-07-13 DIAGNOSIS — Z9181 History of falling: Secondary | ICD-10-CM | POA: Diagnosis not present

## 2018-07-13 DIAGNOSIS — K5190 Ulcerative colitis, unspecified, without complications: Secondary | ICD-10-CM | POA: Diagnosis not present

## 2018-07-13 DIAGNOSIS — J449 Chronic obstructive pulmonary disease, unspecified: Secondary | ICD-10-CM | POA: Diagnosis not present

## 2018-07-13 DIAGNOSIS — I890 Lymphedema, not elsewhere classified: Secondary | ICD-10-CM | POA: Diagnosis not present

## 2018-07-13 DIAGNOSIS — E039 Hypothyroidism, unspecified: Secondary | ICD-10-CM | POA: Diagnosis not present

## 2018-07-13 DIAGNOSIS — I471 Supraventricular tachycardia: Secondary | ICD-10-CM | POA: Diagnosis not present

## 2018-07-17 DIAGNOSIS — I471 Supraventricular tachycardia: Secondary | ICD-10-CM | POA: Diagnosis not present

## 2018-07-17 DIAGNOSIS — I951 Orthostatic hypotension: Secondary | ICD-10-CM | POA: Diagnosis not present

## 2018-07-17 DIAGNOSIS — R2689 Other abnormalities of gait and mobility: Secondary | ICD-10-CM | POA: Diagnosis not present

## 2018-07-17 DIAGNOSIS — K5190 Ulcerative colitis, unspecified, without complications: Secondary | ICD-10-CM | POA: Diagnosis not present

## 2018-07-17 DIAGNOSIS — Z87891 Personal history of nicotine dependence: Secondary | ICD-10-CM | POA: Diagnosis not present

## 2018-07-17 DIAGNOSIS — I890 Lymphedema, not elsewhere classified: Secondary | ICD-10-CM | POA: Diagnosis not present

## 2018-07-17 DIAGNOSIS — J449 Chronic obstructive pulmonary disease, unspecified: Secondary | ICD-10-CM | POA: Diagnosis not present

## 2018-07-17 DIAGNOSIS — E039 Hypothyroidism, unspecified: Secondary | ICD-10-CM | POA: Diagnosis not present

## 2018-07-17 DIAGNOSIS — I5031 Acute diastolic (congestive) heart failure: Secondary | ICD-10-CM | POA: Diagnosis not present

## 2018-07-17 DIAGNOSIS — Z9181 History of falling: Secondary | ICD-10-CM | POA: Diagnosis not present

## 2018-07-17 DIAGNOSIS — J9610 Chronic respiratory failure, unspecified whether with hypoxia or hypercapnia: Secondary | ICD-10-CM | POA: Diagnosis not present

## 2018-07-17 DIAGNOSIS — Z9981 Dependence on supplemental oxygen: Secondary | ICD-10-CM | POA: Diagnosis not present

## 2018-07-26 DIAGNOSIS — I5030 Unspecified diastolic (congestive) heart failure: Secondary | ICD-10-CM | POA: Diagnosis not present

## 2018-08-24 DIAGNOSIS — G4733 Obstructive sleep apnea (adult) (pediatric): Secondary | ICD-10-CM | POA: Diagnosis not present

## 2018-08-24 DIAGNOSIS — I82499 Acute embolism and thrombosis of other specified deep vein of unspecified lower extremity: Secondary | ICD-10-CM | POA: Diagnosis not present

## 2018-08-24 DIAGNOSIS — J449 Chronic obstructive pulmonary disease, unspecified: Secondary | ICD-10-CM | POA: Diagnosis not present

## 2018-08-24 DIAGNOSIS — I5030 Unspecified diastolic (congestive) heart failure: Secondary | ICD-10-CM | POA: Diagnosis not present

## 2018-09-24 DIAGNOSIS — J449 Chronic obstructive pulmonary disease, unspecified: Secondary | ICD-10-CM | POA: Diagnosis not present

## 2018-09-24 DIAGNOSIS — G4733 Obstructive sleep apnea (adult) (pediatric): Secondary | ICD-10-CM | POA: Diagnosis not present

## 2018-09-24 DIAGNOSIS — I82499 Acute embolism and thrombosis of other specified deep vein of unspecified lower extremity: Secondary | ICD-10-CM | POA: Diagnosis not present

## 2018-09-24 DIAGNOSIS — I5030 Unspecified diastolic (congestive) heart failure: Secondary | ICD-10-CM | POA: Diagnosis not present

## 2018-10-24 DIAGNOSIS — G4733 Obstructive sleep apnea (adult) (pediatric): Secondary | ICD-10-CM | POA: Diagnosis not present

## 2018-10-24 DIAGNOSIS — I82499 Acute embolism and thrombosis of other specified deep vein of unspecified lower extremity: Secondary | ICD-10-CM | POA: Diagnosis not present

## 2018-10-24 DIAGNOSIS — I5030 Unspecified diastolic (congestive) heart failure: Secondary | ICD-10-CM | POA: Diagnosis not present

## 2018-10-24 DIAGNOSIS — J449 Chronic obstructive pulmonary disease, unspecified: Secondary | ICD-10-CM | POA: Diagnosis not present

## 2018-11-04 DIAGNOSIS — Z79899 Other long term (current) drug therapy: Secondary | ICD-10-CM | POA: Diagnosis not present

## 2018-11-11 DIAGNOSIS — K51918 Ulcerative colitis, unspecified with other complication: Secondary | ICD-10-CM | POA: Diagnosis not present

## 2018-11-11 DIAGNOSIS — K51911 Ulcerative colitis, unspecified with rectal bleeding: Secondary | ICD-10-CM | POA: Diagnosis not present

## 2018-11-11 DIAGNOSIS — D509 Iron deficiency anemia, unspecified: Secondary | ICD-10-CM | POA: Diagnosis not present

## 2018-11-11 DIAGNOSIS — Z712 Person consulting for explanation of examination or test findings: Secondary | ICD-10-CM | POA: Diagnosis not present

## 2018-11-24 DIAGNOSIS — I5030 Unspecified diastolic (congestive) heart failure: Secondary | ICD-10-CM | POA: Diagnosis not present

## 2018-11-24 DIAGNOSIS — G4733 Obstructive sleep apnea (adult) (pediatric): Secondary | ICD-10-CM | POA: Diagnosis not present

## 2018-11-24 DIAGNOSIS — I82499 Acute embolism and thrombosis of other specified deep vein of unspecified lower extremity: Secondary | ICD-10-CM | POA: Diagnosis not present

## 2018-11-24 DIAGNOSIS — J449 Chronic obstructive pulmonary disease, unspecified: Secondary | ICD-10-CM | POA: Diagnosis not present

## 2018-12-24 DIAGNOSIS — J449 Chronic obstructive pulmonary disease, unspecified: Secondary | ICD-10-CM | POA: Diagnosis not present

## 2018-12-24 DIAGNOSIS — I5030 Unspecified diastolic (congestive) heart failure: Secondary | ICD-10-CM | POA: Diagnosis not present

## 2018-12-24 DIAGNOSIS — I82499 Acute embolism and thrombosis of other specified deep vein of unspecified lower extremity: Secondary | ICD-10-CM | POA: Diagnosis not present

## 2018-12-24 DIAGNOSIS — G4733 Obstructive sleep apnea (adult) (pediatric): Secondary | ICD-10-CM | POA: Diagnosis not present

## 2019-01-24 DIAGNOSIS — I82499 Acute embolism and thrombosis of other specified deep vein of unspecified lower extremity: Secondary | ICD-10-CM | POA: Diagnosis not present

## 2019-01-24 DIAGNOSIS — I5030 Unspecified diastolic (congestive) heart failure: Secondary | ICD-10-CM | POA: Diagnosis not present

## 2019-01-24 DIAGNOSIS — G4733 Obstructive sleep apnea (adult) (pediatric): Secondary | ICD-10-CM | POA: Diagnosis not present

## 2019-01-24 DIAGNOSIS — J449 Chronic obstructive pulmonary disease, unspecified: Secondary | ICD-10-CM | POA: Diagnosis not present

## 2019-01-30 DIAGNOSIS — E039 Hypothyroidism, unspecified: Secondary | ICD-10-CM | POA: Diagnosis not present

## 2019-01-30 DIAGNOSIS — R609 Edema, unspecified: Secondary | ICD-10-CM | POA: Diagnosis not present

## 2019-01-30 DIAGNOSIS — D509 Iron deficiency anemia, unspecified: Secondary | ICD-10-CM | POA: Diagnosis not present

## 2019-01-30 DIAGNOSIS — Z79899 Other long term (current) drug therapy: Secondary | ICD-10-CM | POA: Diagnosis not present

## 2019-02-11 DIAGNOSIS — R609 Edema, unspecified: Secondary | ICD-10-CM | POA: Diagnosis not present

## 2019-02-11 DIAGNOSIS — Z79899 Other long term (current) drug therapy: Secondary | ICD-10-CM | POA: Diagnosis not present

## 2019-02-22 DIAGNOSIS — E039 Hypothyroidism, unspecified: Secondary | ICD-10-CM | POA: Diagnosis not present

## 2019-02-22 DIAGNOSIS — K219 Gastro-esophageal reflux disease without esophagitis: Secondary | ICD-10-CM | POA: Diagnosis not present

## 2019-02-22 DIAGNOSIS — K51918 Ulcerative colitis, unspecified with other complication: Secondary | ICD-10-CM | POA: Diagnosis not present

## 2019-02-22 DIAGNOSIS — J449 Chronic obstructive pulmonary disease, unspecified: Secondary | ICD-10-CM | POA: Diagnosis not present

## 2019-02-22 DIAGNOSIS — R05 Cough: Secondary | ICD-10-CM | POA: Diagnosis not present

## 2019-02-22 DIAGNOSIS — I5030 Unspecified diastolic (congestive) heart failure: Secondary | ICD-10-CM | POA: Diagnosis not present

## 2019-02-23 DIAGNOSIS — F809 Developmental disorder of speech and language, unspecified: Secondary | ICD-10-CM | POA: Diagnosis not present

## 2019-02-23 DIAGNOSIS — E039 Hypothyroidism, unspecified: Secondary | ICD-10-CM | POA: Diagnosis not present

## 2019-02-24 DIAGNOSIS — G4733 Obstructive sleep apnea (adult) (pediatric): Secondary | ICD-10-CM | POA: Diagnosis not present

## 2019-02-24 DIAGNOSIS — I82499 Acute embolism and thrombosis of other specified deep vein of unspecified lower extremity: Secondary | ICD-10-CM | POA: Diagnosis not present

## 2019-02-24 DIAGNOSIS — J449 Chronic obstructive pulmonary disease, unspecified: Secondary | ICD-10-CM | POA: Diagnosis not present

## 2019-02-24 DIAGNOSIS — I5030 Unspecified diastolic (congestive) heart failure: Secondary | ICD-10-CM | POA: Diagnosis not present

## 2019-03-16 ENCOUNTER — Other Ambulatory Visit: Payer: Self-pay | Admitting: Physician Assistant

## 2019-03-18 DIAGNOSIS — I5030 Unspecified diastolic (congestive) heart failure: Secondary | ICD-10-CM | POA: Diagnosis not present

## 2019-03-18 DIAGNOSIS — Z9981 Dependence on supplemental oxygen: Secondary | ICD-10-CM | POA: Diagnosis not present

## 2019-03-18 DIAGNOSIS — I951 Orthostatic hypotension: Secondary | ICD-10-CM | POA: Diagnosis not present

## 2019-03-18 DIAGNOSIS — L03818 Cellulitis of other sites: Secondary | ICD-10-CM | POA: Diagnosis not present

## 2019-03-18 DIAGNOSIS — I110 Hypertensive heart disease with heart failure: Secondary | ICD-10-CM | POA: Diagnosis not present

## 2019-03-18 DIAGNOSIS — R600 Localized edema: Secondary | ICD-10-CM | POA: Diagnosis not present

## 2019-03-18 DIAGNOSIS — Z4800 Encounter for change or removal of nonsurgical wound dressing: Secondary | ICD-10-CM | POA: Diagnosis not present

## 2019-03-18 DIAGNOSIS — E039 Hypothyroidism, unspecified: Secondary | ICD-10-CM | POA: Diagnosis not present

## 2019-03-18 DIAGNOSIS — K51918 Ulcerative colitis, unspecified with other complication: Secondary | ICD-10-CM | POA: Diagnosis not present

## 2019-03-18 DIAGNOSIS — I471 Supraventricular tachycardia: Secondary | ICD-10-CM | POA: Diagnosis not present

## 2019-03-18 DIAGNOSIS — K219 Gastro-esophageal reflux disease without esophagitis: Secondary | ICD-10-CM | POA: Diagnosis not present

## 2019-03-19 DIAGNOSIS — I83009 Varicose veins of unspecified lower extremity with ulcer of unspecified site: Secondary | ICD-10-CM | POA: Diagnosis not present

## 2019-03-23 DIAGNOSIS — E039 Hypothyroidism, unspecified: Secondary | ICD-10-CM | POA: Diagnosis not present

## 2019-03-23 DIAGNOSIS — K51918 Ulcerative colitis, unspecified with other complication: Secondary | ICD-10-CM | POA: Diagnosis not present

## 2019-03-23 DIAGNOSIS — Z9981 Dependence on supplemental oxygen: Secondary | ICD-10-CM | POA: Diagnosis not present

## 2019-03-23 DIAGNOSIS — R600 Localized edema: Secondary | ICD-10-CM | POA: Diagnosis not present

## 2019-03-23 DIAGNOSIS — Z4800 Encounter for change or removal of nonsurgical wound dressing: Secondary | ICD-10-CM | POA: Diagnosis not present

## 2019-03-23 DIAGNOSIS — I5030 Unspecified diastolic (congestive) heart failure: Secondary | ICD-10-CM | POA: Diagnosis not present

## 2019-03-23 DIAGNOSIS — I110 Hypertensive heart disease with heart failure: Secondary | ICD-10-CM | POA: Diagnosis not present

## 2019-03-23 DIAGNOSIS — I951 Orthostatic hypotension: Secondary | ICD-10-CM | POA: Diagnosis not present

## 2019-03-23 DIAGNOSIS — K219 Gastro-esophageal reflux disease without esophagitis: Secondary | ICD-10-CM | POA: Diagnosis not present

## 2019-03-23 DIAGNOSIS — I471 Supraventricular tachycardia: Secondary | ICD-10-CM | POA: Diagnosis not present

## 2019-03-23 DIAGNOSIS — L03818 Cellulitis of other sites: Secondary | ICD-10-CM | POA: Diagnosis not present

## 2019-03-24 DIAGNOSIS — R600 Localized edema: Secondary | ICD-10-CM | POA: Diagnosis not present

## 2019-03-24 DIAGNOSIS — Z9981 Dependence on supplemental oxygen: Secondary | ICD-10-CM | POA: Diagnosis not present

## 2019-03-24 DIAGNOSIS — I5030 Unspecified diastolic (congestive) heart failure: Secondary | ICD-10-CM | POA: Diagnosis not present

## 2019-03-24 DIAGNOSIS — L03818 Cellulitis of other sites: Secondary | ICD-10-CM | POA: Diagnosis not present

## 2019-03-24 DIAGNOSIS — Z4800 Encounter for change or removal of nonsurgical wound dressing: Secondary | ICD-10-CM | POA: Diagnosis not present

## 2019-03-24 DIAGNOSIS — K51918 Ulcerative colitis, unspecified with other complication: Secondary | ICD-10-CM | POA: Diagnosis not present

## 2019-03-24 DIAGNOSIS — I110 Hypertensive heart disease with heart failure: Secondary | ICD-10-CM | POA: Diagnosis not present

## 2019-03-24 DIAGNOSIS — I471 Supraventricular tachycardia: Secondary | ICD-10-CM | POA: Diagnosis not present

## 2019-03-24 DIAGNOSIS — I951 Orthostatic hypotension: Secondary | ICD-10-CM | POA: Diagnosis not present

## 2019-03-24 DIAGNOSIS — K219 Gastro-esophageal reflux disease without esophagitis: Secondary | ICD-10-CM | POA: Diagnosis not present

## 2019-03-24 DIAGNOSIS — E039 Hypothyroidism, unspecified: Secondary | ICD-10-CM | POA: Diagnosis not present

## 2019-03-25 DIAGNOSIS — I471 Supraventricular tachycardia: Secondary | ICD-10-CM | POA: Diagnosis not present

## 2019-03-25 DIAGNOSIS — I110 Hypertensive heart disease with heart failure: Secondary | ICD-10-CM | POA: Diagnosis not present

## 2019-03-25 DIAGNOSIS — R600 Localized edema: Secondary | ICD-10-CM | POA: Diagnosis not present

## 2019-03-25 DIAGNOSIS — Z4800 Encounter for change or removal of nonsurgical wound dressing: Secondary | ICD-10-CM | POA: Diagnosis not present

## 2019-03-25 DIAGNOSIS — I5030 Unspecified diastolic (congestive) heart failure: Secondary | ICD-10-CM | POA: Diagnosis not present

## 2019-03-25 DIAGNOSIS — I951 Orthostatic hypotension: Secondary | ICD-10-CM | POA: Diagnosis not present

## 2019-03-25 DIAGNOSIS — K219 Gastro-esophageal reflux disease without esophagitis: Secondary | ICD-10-CM | POA: Diagnosis not present

## 2019-03-25 DIAGNOSIS — K51918 Ulcerative colitis, unspecified with other complication: Secondary | ICD-10-CM | POA: Diagnosis not present

## 2019-03-25 DIAGNOSIS — Z9981 Dependence on supplemental oxygen: Secondary | ICD-10-CM | POA: Diagnosis not present

## 2019-03-25 DIAGNOSIS — L03818 Cellulitis of other sites: Secondary | ICD-10-CM | POA: Diagnosis not present

## 2019-03-25 DIAGNOSIS — E039 Hypothyroidism, unspecified: Secondary | ICD-10-CM | POA: Diagnosis not present

## 2019-03-26 DIAGNOSIS — I5030 Unspecified diastolic (congestive) heart failure: Secondary | ICD-10-CM | POA: Diagnosis not present

## 2019-03-26 DIAGNOSIS — I82499 Acute embolism and thrombosis of other specified deep vein of unspecified lower extremity: Secondary | ICD-10-CM | POA: Diagnosis not present

## 2019-03-26 DIAGNOSIS — G4733 Obstructive sleep apnea (adult) (pediatric): Secondary | ICD-10-CM | POA: Diagnosis not present

## 2019-03-26 DIAGNOSIS — J449 Chronic obstructive pulmonary disease, unspecified: Secondary | ICD-10-CM | POA: Diagnosis not present

## 2019-03-30 DIAGNOSIS — I951 Orthostatic hypotension: Secondary | ICD-10-CM | POA: Diagnosis not present

## 2019-03-30 DIAGNOSIS — I471 Supraventricular tachycardia: Secondary | ICD-10-CM | POA: Diagnosis not present

## 2019-03-30 DIAGNOSIS — L03818 Cellulitis of other sites: Secondary | ICD-10-CM | POA: Diagnosis not present

## 2019-03-30 DIAGNOSIS — I5030 Unspecified diastolic (congestive) heart failure: Secondary | ICD-10-CM | POA: Diagnosis not present

## 2019-03-30 DIAGNOSIS — Z4800 Encounter for change or removal of nonsurgical wound dressing: Secondary | ICD-10-CM | POA: Diagnosis not present

## 2019-03-30 DIAGNOSIS — I110 Hypertensive heart disease with heart failure: Secondary | ICD-10-CM | POA: Diagnosis not present

## 2019-03-30 DIAGNOSIS — K51918 Ulcerative colitis, unspecified with other complication: Secondary | ICD-10-CM | POA: Diagnosis not present

## 2019-03-30 DIAGNOSIS — E039 Hypothyroidism, unspecified: Secondary | ICD-10-CM | POA: Diagnosis not present

## 2019-03-30 DIAGNOSIS — K219 Gastro-esophageal reflux disease without esophagitis: Secondary | ICD-10-CM | POA: Diagnosis not present

## 2019-03-30 DIAGNOSIS — R600 Localized edema: Secondary | ICD-10-CM | POA: Diagnosis not present

## 2019-03-30 DIAGNOSIS — Z9981 Dependence on supplemental oxygen: Secondary | ICD-10-CM | POA: Diagnosis not present

## 2019-03-31 DIAGNOSIS — I83009 Varicose veins of unspecified lower extremity with ulcer of unspecified site: Secondary | ICD-10-CM | POA: Diagnosis not present

## 2019-04-01 DIAGNOSIS — I951 Orthostatic hypotension: Secondary | ICD-10-CM | POA: Diagnosis not present

## 2019-04-01 DIAGNOSIS — Z4800 Encounter for change or removal of nonsurgical wound dressing: Secondary | ICD-10-CM | POA: Diagnosis not present

## 2019-04-01 DIAGNOSIS — Z9981 Dependence on supplemental oxygen: Secondary | ICD-10-CM | POA: Diagnosis not present

## 2019-04-01 DIAGNOSIS — K51918 Ulcerative colitis, unspecified with other complication: Secondary | ICD-10-CM | POA: Diagnosis not present

## 2019-04-01 DIAGNOSIS — I471 Supraventricular tachycardia: Secondary | ICD-10-CM | POA: Diagnosis not present

## 2019-04-01 DIAGNOSIS — I110 Hypertensive heart disease with heart failure: Secondary | ICD-10-CM | POA: Diagnosis not present

## 2019-04-01 DIAGNOSIS — E039 Hypothyroidism, unspecified: Secondary | ICD-10-CM | POA: Diagnosis not present

## 2019-04-01 DIAGNOSIS — I5030 Unspecified diastolic (congestive) heart failure: Secondary | ICD-10-CM | POA: Diagnosis not present

## 2019-04-01 DIAGNOSIS — K219 Gastro-esophageal reflux disease without esophagitis: Secondary | ICD-10-CM | POA: Diagnosis not present

## 2019-04-01 DIAGNOSIS — R600 Localized edema: Secondary | ICD-10-CM | POA: Diagnosis not present

## 2019-04-01 DIAGNOSIS — L03818 Cellulitis of other sites: Secondary | ICD-10-CM | POA: Diagnosis not present

## 2019-04-05 DIAGNOSIS — Z9981 Dependence on supplemental oxygen: Secondary | ICD-10-CM | POA: Diagnosis not present

## 2019-04-05 DIAGNOSIS — Z4800 Encounter for change or removal of nonsurgical wound dressing: Secondary | ICD-10-CM | POA: Diagnosis not present

## 2019-04-05 DIAGNOSIS — K219 Gastro-esophageal reflux disease without esophagitis: Secondary | ICD-10-CM | POA: Diagnosis not present

## 2019-04-05 DIAGNOSIS — I951 Orthostatic hypotension: Secondary | ICD-10-CM | POA: Diagnosis not present

## 2019-04-05 DIAGNOSIS — L03818 Cellulitis of other sites: Secondary | ICD-10-CM | POA: Diagnosis not present

## 2019-04-05 DIAGNOSIS — I5030 Unspecified diastolic (congestive) heart failure: Secondary | ICD-10-CM | POA: Diagnosis not present

## 2019-04-05 DIAGNOSIS — I110 Hypertensive heart disease with heart failure: Secondary | ICD-10-CM | POA: Diagnosis not present

## 2019-04-05 DIAGNOSIS — R600 Localized edema: Secondary | ICD-10-CM | POA: Diagnosis not present

## 2019-04-05 DIAGNOSIS — K51918 Ulcerative colitis, unspecified with other complication: Secondary | ICD-10-CM | POA: Diagnosis not present

## 2019-04-05 DIAGNOSIS — E039 Hypothyroidism, unspecified: Secondary | ICD-10-CM | POA: Diagnosis not present

## 2019-04-05 DIAGNOSIS — I471 Supraventricular tachycardia: Secondary | ICD-10-CM | POA: Diagnosis not present

## 2019-04-07 DIAGNOSIS — I110 Hypertensive heart disease with heart failure: Secondary | ICD-10-CM | POA: Diagnosis not present

## 2019-04-07 DIAGNOSIS — Z9981 Dependence on supplemental oxygen: Secondary | ICD-10-CM | POA: Diagnosis not present

## 2019-04-07 DIAGNOSIS — I951 Orthostatic hypotension: Secondary | ICD-10-CM | POA: Diagnosis not present

## 2019-04-07 DIAGNOSIS — K51918 Ulcerative colitis, unspecified with other complication: Secondary | ICD-10-CM | POA: Diagnosis not present

## 2019-04-07 DIAGNOSIS — K219 Gastro-esophageal reflux disease without esophagitis: Secondary | ICD-10-CM | POA: Diagnosis not present

## 2019-04-07 DIAGNOSIS — I471 Supraventricular tachycardia: Secondary | ICD-10-CM | POA: Diagnosis not present

## 2019-04-07 DIAGNOSIS — Z4800 Encounter for change or removal of nonsurgical wound dressing: Secondary | ICD-10-CM | POA: Diagnosis not present

## 2019-04-07 DIAGNOSIS — E039 Hypothyroidism, unspecified: Secondary | ICD-10-CM | POA: Diagnosis not present

## 2019-04-07 DIAGNOSIS — R600 Localized edema: Secondary | ICD-10-CM | POA: Diagnosis not present

## 2019-04-07 DIAGNOSIS — L03818 Cellulitis of other sites: Secondary | ICD-10-CM | POA: Diagnosis not present

## 2019-04-07 DIAGNOSIS — I5030 Unspecified diastolic (congestive) heart failure: Secondary | ICD-10-CM | POA: Diagnosis not present

## 2019-04-09 DIAGNOSIS — I83009 Varicose veins of unspecified lower extremity with ulcer of unspecified site: Secondary | ICD-10-CM | POA: Diagnosis not present

## 2019-04-12 DIAGNOSIS — I5030 Unspecified diastolic (congestive) heart failure: Secondary | ICD-10-CM | POA: Diagnosis not present

## 2019-04-12 DIAGNOSIS — I951 Orthostatic hypotension: Secondary | ICD-10-CM | POA: Diagnosis not present

## 2019-04-12 DIAGNOSIS — R600 Localized edema: Secondary | ICD-10-CM | POA: Diagnosis not present

## 2019-04-12 DIAGNOSIS — L03818 Cellulitis of other sites: Secondary | ICD-10-CM | POA: Diagnosis not present

## 2019-04-12 DIAGNOSIS — J449 Chronic obstructive pulmonary disease, unspecified: Secondary | ICD-10-CM | POA: Diagnosis not present

## 2019-04-12 DIAGNOSIS — Z9981 Dependence on supplemental oxygen: Secondary | ICD-10-CM | POA: Diagnosis not present

## 2019-04-12 DIAGNOSIS — K51918 Ulcerative colitis, unspecified with other complication: Secondary | ICD-10-CM | POA: Diagnosis not present

## 2019-04-12 DIAGNOSIS — I110 Hypertensive heart disease with heart failure: Secondary | ICD-10-CM | POA: Diagnosis not present

## 2019-04-12 DIAGNOSIS — I471 Supraventricular tachycardia: Secondary | ICD-10-CM | POA: Diagnosis not present

## 2019-04-12 DIAGNOSIS — K219 Gastro-esophageal reflux disease without esophagitis: Secondary | ICD-10-CM | POA: Diagnosis not present

## 2019-04-12 DIAGNOSIS — Z4800 Encounter for change or removal of nonsurgical wound dressing: Secondary | ICD-10-CM | POA: Diagnosis not present

## 2019-04-12 DIAGNOSIS — E039 Hypothyroidism, unspecified: Secondary | ICD-10-CM | POA: Diagnosis not present

## 2019-04-15 DIAGNOSIS — K219 Gastro-esophageal reflux disease without esophagitis: Secondary | ICD-10-CM | POA: Diagnosis not present

## 2019-04-15 DIAGNOSIS — L03818 Cellulitis of other sites: Secondary | ICD-10-CM | POA: Diagnosis not present

## 2019-04-15 DIAGNOSIS — E039 Hypothyroidism, unspecified: Secondary | ICD-10-CM | POA: Diagnosis not present

## 2019-04-15 DIAGNOSIS — I110 Hypertensive heart disease with heart failure: Secondary | ICD-10-CM | POA: Diagnosis not present

## 2019-04-15 DIAGNOSIS — Z9981 Dependence on supplemental oxygen: Secondary | ICD-10-CM | POA: Diagnosis not present

## 2019-04-15 DIAGNOSIS — I951 Orthostatic hypotension: Secondary | ICD-10-CM | POA: Diagnosis not present

## 2019-04-15 DIAGNOSIS — I5030 Unspecified diastolic (congestive) heart failure: Secondary | ICD-10-CM | POA: Diagnosis not present

## 2019-04-15 DIAGNOSIS — K51918 Ulcerative colitis, unspecified with other complication: Secondary | ICD-10-CM | POA: Diagnosis not present

## 2019-04-15 DIAGNOSIS — R600 Localized edema: Secondary | ICD-10-CM | POA: Diagnosis not present

## 2019-04-15 DIAGNOSIS — Z4800 Encounter for change or removal of nonsurgical wound dressing: Secondary | ICD-10-CM | POA: Diagnosis not present

## 2019-04-15 DIAGNOSIS — I471 Supraventricular tachycardia: Secondary | ICD-10-CM | POA: Diagnosis not present

## 2019-04-19 DIAGNOSIS — I471 Supraventricular tachycardia: Secondary | ICD-10-CM | POA: Diagnosis not present

## 2019-04-19 DIAGNOSIS — Z9981 Dependence on supplemental oxygen: Secondary | ICD-10-CM | POA: Diagnosis not present

## 2019-04-19 DIAGNOSIS — I110 Hypertensive heart disease with heart failure: Secondary | ICD-10-CM | POA: Diagnosis not present

## 2019-04-19 DIAGNOSIS — Z4800 Encounter for change or removal of nonsurgical wound dressing: Secondary | ICD-10-CM | POA: Diagnosis not present

## 2019-04-19 DIAGNOSIS — E039 Hypothyroidism, unspecified: Secondary | ICD-10-CM | POA: Diagnosis not present

## 2019-04-19 DIAGNOSIS — R600 Localized edema: Secondary | ICD-10-CM | POA: Diagnosis not present

## 2019-04-19 DIAGNOSIS — I5030 Unspecified diastolic (congestive) heart failure: Secondary | ICD-10-CM | POA: Diagnosis not present

## 2019-04-19 DIAGNOSIS — K51918 Ulcerative colitis, unspecified with other complication: Secondary | ICD-10-CM | POA: Diagnosis not present

## 2019-04-19 DIAGNOSIS — L03818 Cellulitis of other sites: Secondary | ICD-10-CM | POA: Diagnosis not present

## 2019-04-19 DIAGNOSIS — I951 Orthostatic hypotension: Secondary | ICD-10-CM | POA: Diagnosis not present

## 2019-04-19 DIAGNOSIS — K219 Gastro-esophageal reflux disease without esophagitis: Secondary | ICD-10-CM | POA: Diagnosis not present

## 2019-04-23 DIAGNOSIS — I110 Hypertensive heart disease with heart failure: Secondary | ICD-10-CM | POA: Diagnosis not present

## 2019-04-23 DIAGNOSIS — I951 Orthostatic hypotension: Secondary | ICD-10-CM | POA: Diagnosis not present

## 2019-04-23 DIAGNOSIS — I471 Supraventricular tachycardia: Secondary | ICD-10-CM | POA: Diagnosis not present

## 2019-04-23 DIAGNOSIS — Z9981 Dependence on supplemental oxygen: Secondary | ICD-10-CM | POA: Diagnosis not present

## 2019-04-23 DIAGNOSIS — K51918 Ulcerative colitis, unspecified with other complication: Secondary | ICD-10-CM | POA: Diagnosis not present

## 2019-04-23 DIAGNOSIS — K219 Gastro-esophageal reflux disease without esophagitis: Secondary | ICD-10-CM | POA: Diagnosis not present

## 2019-04-23 DIAGNOSIS — Z4800 Encounter for change or removal of nonsurgical wound dressing: Secondary | ICD-10-CM | POA: Diagnosis not present

## 2019-04-23 DIAGNOSIS — L03818 Cellulitis of other sites: Secondary | ICD-10-CM | POA: Diagnosis not present

## 2019-04-23 DIAGNOSIS — E039 Hypothyroidism, unspecified: Secondary | ICD-10-CM | POA: Diagnosis not present

## 2019-04-23 DIAGNOSIS — R600 Localized edema: Secondary | ICD-10-CM | POA: Diagnosis not present

## 2019-04-23 DIAGNOSIS — I5030 Unspecified diastolic (congestive) heart failure: Secondary | ICD-10-CM | POA: Diagnosis not present

## 2019-04-26 DIAGNOSIS — I82499 Acute embolism and thrombosis of other specified deep vein of unspecified lower extremity: Secondary | ICD-10-CM | POA: Diagnosis not present

## 2019-04-26 DIAGNOSIS — J449 Chronic obstructive pulmonary disease, unspecified: Secondary | ICD-10-CM | POA: Diagnosis not present

## 2019-04-26 DIAGNOSIS — G4733 Obstructive sleep apnea (adult) (pediatric): Secondary | ICD-10-CM | POA: Diagnosis not present

## 2019-04-26 DIAGNOSIS — I5030 Unspecified diastolic (congestive) heart failure: Secondary | ICD-10-CM | POA: Diagnosis not present

## 2019-04-29 DIAGNOSIS — I471 Supraventricular tachycardia: Secondary | ICD-10-CM | POA: Diagnosis not present

## 2019-04-29 DIAGNOSIS — I110 Hypertensive heart disease with heart failure: Secondary | ICD-10-CM | POA: Diagnosis not present

## 2019-04-29 DIAGNOSIS — I951 Orthostatic hypotension: Secondary | ICD-10-CM | POA: Diagnosis not present

## 2019-04-29 DIAGNOSIS — Z9981 Dependence on supplemental oxygen: Secondary | ICD-10-CM | POA: Diagnosis not present

## 2019-04-29 DIAGNOSIS — I5030 Unspecified diastolic (congestive) heart failure: Secondary | ICD-10-CM | POA: Diagnosis not present

## 2019-04-29 DIAGNOSIS — K219 Gastro-esophageal reflux disease without esophagitis: Secondary | ICD-10-CM | POA: Diagnosis not present

## 2019-04-29 DIAGNOSIS — R600 Localized edema: Secondary | ICD-10-CM | POA: Diagnosis not present

## 2019-04-29 DIAGNOSIS — E039 Hypothyroidism, unspecified: Secondary | ICD-10-CM | POA: Diagnosis not present

## 2019-04-29 DIAGNOSIS — Z4800 Encounter for change or removal of nonsurgical wound dressing: Secondary | ICD-10-CM | POA: Diagnosis not present

## 2019-04-29 DIAGNOSIS — L03818 Cellulitis of other sites: Secondary | ICD-10-CM | POA: Diagnosis not present

## 2019-04-29 DIAGNOSIS — K51918 Ulcerative colitis, unspecified with other complication: Secondary | ICD-10-CM | POA: Diagnosis not present

## 2019-05-03 DIAGNOSIS — I951 Orthostatic hypotension: Secondary | ICD-10-CM | POA: Diagnosis not present

## 2019-05-03 DIAGNOSIS — L03818 Cellulitis of other sites: Secondary | ICD-10-CM | POA: Diagnosis not present

## 2019-05-03 DIAGNOSIS — I471 Supraventricular tachycardia: Secondary | ICD-10-CM | POA: Diagnosis not present

## 2019-05-03 DIAGNOSIS — K51918 Ulcerative colitis, unspecified with other complication: Secondary | ICD-10-CM | POA: Diagnosis not present

## 2019-05-03 DIAGNOSIS — R600 Localized edema: Secondary | ICD-10-CM | POA: Diagnosis not present

## 2019-05-03 DIAGNOSIS — K219 Gastro-esophageal reflux disease without esophagitis: Secondary | ICD-10-CM | POA: Diagnosis not present

## 2019-05-03 DIAGNOSIS — I110 Hypertensive heart disease with heart failure: Secondary | ICD-10-CM | POA: Diagnosis not present

## 2019-05-03 DIAGNOSIS — I5030 Unspecified diastolic (congestive) heart failure: Secondary | ICD-10-CM | POA: Diagnosis not present

## 2019-05-03 DIAGNOSIS — Z4800 Encounter for change or removal of nonsurgical wound dressing: Secondary | ICD-10-CM | POA: Diagnosis not present

## 2019-05-03 DIAGNOSIS — E039 Hypothyroidism, unspecified: Secondary | ICD-10-CM | POA: Diagnosis not present

## 2019-05-03 DIAGNOSIS — Z9981 Dependence on supplemental oxygen: Secondary | ICD-10-CM | POA: Diagnosis not present

## 2019-05-10 DIAGNOSIS — G9340 Encephalopathy, unspecified: Secondary | ICD-10-CM | POA: Diagnosis not present

## 2019-05-10 DIAGNOSIS — E079 Disorder of thyroid, unspecified: Secondary | ICD-10-CM | POA: Diagnosis not present

## 2019-05-10 DIAGNOSIS — R296 Repeated falls: Secondary | ICD-10-CM | POA: Diagnosis not present

## 2019-05-10 DIAGNOSIS — R1909 Other intra-abdominal and pelvic swelling, mass and lump: Secondary | ICD-10-CM | POA: Diagnosis not present

## 2019-05-10 DIAGNOSIS — R579 Shock, unspecified: Secondary | ICD-10-CM | POA: Diagnosis not present

## 2019-05-10 DIAGNOSIS — N179 Acute kidney failure, unspecified: Secondary | ICD-10-CM | POA: Diagnosis not present

## 2019-05-10 DIAGNOSIS — E039 Hypothyroidism, unspecified: Secondary | ICD-10-CM | POA: Diagnosis not present

## 2019-05-10 DIAGNOSIS — Z66 Do not resuscitate: Secondary | ICD-10-CM | POA: Diagnosis not present

## 2019-05-10 DIAGNOSIS — J9602 Acute respiratory failure with hypercapnia: Secondary | ICD-10-CM | POA: Diagnosis not present

## 2019-05-10 DIAGNOSIS — N170 Acute kidney failure with tubular necrosis: Secondary | ICD-10-CM | POA: Diagnosis not present

## 2019-05-10 DIAGNOSIS — I471 Supraventricular tachycardia: Secondary | ICD-10-CM | POA: Diagnosis not present

## 2019-05-10 DIAGNOSIS — D72818 Other decreased white blood cell count: Secondary | ICD-10-CM | POA: Diagnosis not present

## 2019-05-10 DIAGNOSIS — J9622 Acute and chronic respiratory failure with hypercapnia: Secondary | ICD-10-CM | POA: Diagnosis not present

## 2019-05-10 DIAGNOSIS — I110 Hypertensive heart disease with heart failure: Secondary | ICD-10-CM | POA: Diagnosis not present

## 2019-05-10 DIAGNOSIS — R1084 Generalized abdominal pain: Secondary | ICD-10-CM | POA: Diagnosis not present

## 2019-05-10 DIAGNOSIS — S0511XA Contusion of eyeball and orbital tissues, right eye, initial encounter: Secondary | ICD-10-CM | POA: Diagnosis not present

## 2019-05-10 DIAGNOSIS — R0902 Hypoxemia: Secondary | ICD-10-CM | POA: Diagnosis not present

## 2019-05-10 DIAGNOSIS — R600 Localized edema: Secondary | ICD-10-CM | POA: Diagnosis not present

## 2019-05-10 DIAGNOSIS — I272 Pulmonary hypertension, unspecified: Secondary | ICD-10-CM | POA: Diagnosis not present

## 2019-05-10 DIAGNOSIS — E161 Other hypoglycemia: Secondary | ICD-10-CM | POA: Diagnosis not present

## 2019-05-10 DIAGNOSIS — R079 Chest pain, unspecified: Secondary | ICD-10-CM | POA: Diagnosis not present

## 2019-05-10 DIAGNOSIS — J90 Pleural effusion, not elsewhere classified: Secondary | ICD-10-CM | POA: Diagnosis not present

## 2019-05-10 DIAGNOSIS — D7589 Other specified diseases of blood and blood-forming organs: Secondary | ICD-10-CM | POA: Diagnosis not present

## 2019-05-10 DIAGNOSIS — G92 Toxic encephalopathy: Secondary | ICD-10-CM | POA: Diagnosis not present

## 2019-05-10 DIAGNOSIS — Z79899 Other long term (current) drug therapy: Secondary | ICD-10-CM | POA: Diagnosis not present

## 2019-05-10 DIAGNOSIS — T68XXXA Hypothermia, initial encounter: Secondary | ICD-10-CM | POA: Diagnosis not present

## 2019-05-10 DIAGNOSIS — I951 Orthostatic hypotension: Secondary | ICD-10-CM | POA: Diagnosis not present

## 2019-05-10 DIAGNOSIS — I5032 Chronic diastolic (congestive) heart failure: Secondary | ICD-10-CM | POA: Diagnosis not present

## 2019-05-10 DIAGNOSIS — I959 Hypotension, unspecified: Secondary | ICD-10-CM | POA: Diagnosis not present

## 2019-05-10 DIAGNOSIS — R1900 Intra-abdominal and pelvic swelling, mass and lump, unspecified site: Secondary | ICD-10-CM | POA: Diagnosis not present

## 2019-05-10 DIAGNOSIS — I5030 Unspecified diastolic (congestive) heart failure: Secondary | ICD-10-CM | POA: Diagnosis not present

## 2019-05-10 DIAGNOSIS — R531 Weakness: Secondary | ICD-10-CM | POA: Diagnosis not present

## 2019-05-10 DIAGNOSIS — Z452 Encounter for adjustment and management of vascular access device: Secondary | ICD-10-CM | POA: Diagnosis not present

## 2019-05-10 DIAGNOSIS — Z4800 Encounter for change or removal of nonsurgical wound dressing: Secondary | ICD-10-CM | POA: Diagnosis not present

## 2019-05-10 DIAGNOSIS — M7989 Other specified soft tissue disorders: Secondary | ICD-10-CM | POA: Diagnosis not present

## 2019-05-10 DIAGNOSIS — D72810 Lymphocytopenia: Secondary | ICD-10-CM | POA: Diagnosis not present

## 2019-05-10 DIAGNOSIS — J449 Chronic obstructive pulmonary disease, unspecified: Secondary | ICD-10-CM | POA: Diagnosis not present

## 2019-05-10 DIAGNOSIS — Z9981 Dependence on supplemental oxygen: Secondary | ICD-10-CM | POA: Diagnosis not present

## 2019-05-10 DIAGNOSIS — J811 Chronic pulmonary edema: Secondary | ICD-10-CM | POA: Diagnosis not present

## 2019-05-10 DIAGNOSIS — R410 Disorientation, unspecified: Secondary | ICD-10-CM | POA: Diagnosis not present

## 2019-05-10 DIAGNOSIS — E785 Hyperlipidemia, unspecified: Secondary | ICD-10-CM | POA: Diagnosis not present

## 2019-05-10 DIAGNOSIS — R918 Other nonspecific abnormal finding of lung field: Secondary | ICD-10-CM | POA: Diagnosis not present

## 2019-05-10 DIAGNOSIS — L03818 Cellulitis of other sites: Secondary | ICD-10-CM | POA: Diagnosis not present

## 2019-05-10 DIAGNOSIS — I2510 Atherosclerotic heart disease of native coronary artery without angina pectoris: Secondary | ICD-10-CM | POA: Diagnosis not present

## 2019-05-10 DIAGNOSIS — Z743 Need for continuous supervision: Secondary | ICD-10-CM | POA: Diagnosis not present

## 2019-05-10 DIAGNOSIS — E875 Hyperkalemia: Secondary | ICD-10-CM | POA: Diagnosis not present

## 2019-05-10 DIAGNOSIS — M5030 Other cervical disc degeneration, unspecified cervical region: Secondary | ICD-10-CM | POA: Diagnosis not present

## 2019-05-10 DIAGNOSIS — E162 Hypoglycemia, unspecified: Secondary | ICD-10-CM | POA: Diagnosis not present

## 2019-05-10 DIAGNOSIS — R5381 Other malaise: Secondary | ICD-10-CM | POA: Diagnosis not present

## 2019-05-10 DIAGNOSIS — E872 Acidosis: Secondary | ICD-10-CM | POA: Diagnosis not present

## 2019-05-10 DIAGNOSIS — I472 Ventricular tachycardia: Secondary | ICD-10-CM | POA: Diagnosis not present

## 2019-05-10 DIAGNOSIS — R680 Hypothermia, not associated with low environmental temperature: Secondary | ICD-10-CM | POA: Diagnosis not present

## 2019-05-10 DIAGNOSIS — S199XXA Unspecified injury of neck, initial encounter: Secondary | ICD-10-CM | POA: Diagnosis not present

## 2019-05-10 DIAGNOSIS — S0990XA Unspecified injury of head, initial encounter: Secondary | ICD-10-CM | POA: Diagnosis not present

## 2019-05-10 DIAGNOSIS — J9621 Acute and chronic respiratory failure with hypoxia: Secondary | ICD-10-CM | POA: Diagnosis not present

## 2019-05-10 DIAGNOSIS — K51918 Ulcerative colitis, unspecified with other complication: Secondary | ICD-10-CM | POA: Diagnosis not present

## 2019-05-10 DIAGNOSIS — I4510 Unspecified right bundle-branch block: Secondary | ICD-10-CM | POA: Diagnosis not present

## 2019-05-10 DIAGNOSIS — K5190 Ulcerative colitis, unspecified, without complications: Secondary | ICD-10-CM | POA: Diagnosis not present

## 2019-05-10 DIAGNOSIS — K219 Gastro-esophageal reflux disease without esophagitis: Secondary | ICD-10-CM | POA: Diagnosis not present

## 2019-05-10 DIAGNOSIS — I517 Cardiomegaly: Secondary | ICD-10-CM | POA: Diagnosis not present

## 2019-05-10 DIAGNOSIS — J9692 Respiratory failure, unspecified with hypercapnia: Secondary | ICD-10-CM | POA: Diagnosis not present

## 2019-05-10 DIAGNOSIS — D61818 Other pancytopenia: Secondary | ICD-10-CM | POA: Diagnosis not present

## 2019-05-10 DIAGNOSIS — J9601 Acute respiratory failure with hypoxia: Secondary | ICD-10-CM | POA: Diagnosis not present

## 2019-05-10 DIAGNOSIS — J439 Emphysema, unspecified: Secondary | ICD-10-CM | POA: Diagnosis not present

## 2019-05-10 DIAGNOSIS — R279 Unspecified lack of coordination: Secondary | ICD-10-CM | POA: Diagnosis not present

## 2019-05-10 DIAGNOSIS — R9389 Abnormal findings on diagnostic imaging of other specified body structures: Secondary | ICD-10-CM | POA: Diagnosis not present

## 2019-05-10 DIAGNOSIS — Z87891 Personal history of nicotine dependence: Secondary | ICD-10-CM | POA: Diagnosis not present

## 2019-05-10 DIAGNOSIS — I509 Heart failure, unspecified: Secondary | ICD-10-CM | POA: Diagnosis not present

## 2019-05-10 DIAGNOSIS — R001 Bradycardia, unspecified: Secondary | ICD-10-CM | POA: Diagnosis not present

## 2019-05-10 DIAGNOSIS — M6281 Muscle weakness (generalized): Secondary | ICD-10-CM | POA: Diagnosis not present

## 2019-05-10 DIAGNOSIS — J168 Pneumonia due to other specified infectious organisms: Secondary | ICD-10-CM | POA: Diagnosis not present

## 2019-05-10 DIAGNOSIS — N1330 Unspecified hydronephrosis: Secondary | ICD-10-CM | POA: Diagnosis not present

## 2019-05-10 DIAGNOSIS — R9431 Abnormal electrocardiogram [ECG] [EKG]: Secondary | ICD-10-CM | POA: Diagnosis not present

## 2019-05-10 DIAGNOSIS — J9811 Atelectasis: Secondary | ICD-10-CM | POA: Diagnosis not present

## 2019-05-10 DIAGNOSIS — R970 Elevated carcinoembryonic antigen [CEA]: Secondary | ICD-10-CM | POA: Diagnosis not present

## 2019-05-10 DIAGNOSIS — R933 Abnormal findings on diagnostic imaging of other parts of digestive tract: Secondary | ICD-10-CM | POA: Diagnosis not present

## 2019-05-10 DIAGNOSIS — D539 Nutritional anemia, unspecified: Secondary | ICD-10-CM | POA: Diagnosis not present

## 2019-05-10 DIAGNOSIS — Z23 Encounter for immunization: Secondary | ICD-10-CM | POA: Diagnosis not present

## 2019-05-10 DIAGNOSIS — Z515 Encounter for palliative care: Secondary | ICD-10-CM | POA: Diagnosis not present

## 2019-05-10 DIAGNOSIS — E876 Hypokalemia: Secondary | ICD-10-CM | POA: Diagnosis not present

## 2019-05-10 DIAGNOSIS — D649 Anemia, unspecified: Secondary | ICD-10-CM | POA: Diagnosis not present

## 2019-05-10 DIAGNOSIS — E8770 Fluid overload, unspecified: Secondary | ICD-10-CM | POA: Diagnosis not present

## 2019-05-26 DIAGNOSIS — J449 Chronic obstructive pulmonary disease, unspecified: Secondary | ICD-10-CM | POA: Diagnosis not present

## 2019-05-26 DIAGNOSIS — G4733 Obstructive sleep apnea (adult) (pediatric): Secondary | ICD-10-CM | POA: Diagnosis not present

## 2019-05-26 DIAGNOSIS — I82499 Acute embolism and thrombosis of other specified deep vein of unspecified lower extremity: Secondary | ICD-10-CM | POA: Diagnosis not present

## 2019-05-26 DIAGNOSIS — I5030 Unspecified diastolic (congestive) heart failure: Secondary | ICD-10-CM | POA: Diagnosis not present

## 2019-06-01 DIAGNOSIS — E039 Hypothyroidism, unspecified: Secondary | ICD-10-CM | POA: Diagnosis not present

## 2019-06-01 DIAGNOSIS — Z515 Encounter for palliative care: Secondary | ICD-10-CM | POA: Diagnosis not present

## 2019-06-26 DIAGNOSIS — G4733 Obstructive sleep apnea (adult) (pediatric): Secondary | ICD-10-CM | POA: Diagnosis not present

## 2019-06-26 DIAGNOSIS — I82499 Acute embolism and thrombosis of other specified deep vein of unspecified lower extremity: Secondary | ICD-10-CM | POA: Diagnosis not present

## 2019-06-26 DIAGNOSIS — J449 Chronic obstructive pulmonary disease, unspecified: Secondary | ICD-10-CM | POA: Diagnosis not present

## 2019-06-26 DIAGNOSIS — I5030 Unspecified diastolic (congestive) heart failure: Secondary | ICD-10-CM | POA: Diagnosis not present

## 2019-07-01 DIAGNOSIS — I5033 Acute on chronic diastolic (congestive) heart failure: Secondary | ICD-10-CM | POA: Diagnosis not present

## 2019-07-01 DIAGNOSIS — I10 Essential (primary) hypertension: Secondary | ICD-10-CM | POA: Diagnosis not present

## 2019-07-01 DIAGNOSIS — Z09 Encounter for follow-up examination after completed treatment for conditions other than malignant neoplasm: Secondary | ICD-10-CM | POA: Diagnosis not present

## 2019-07-01 DIAGNOSIS — J449 Chronic obstructive pulmonary disease, unspecified: Secondary | ICD-10-CM | POA: Diagnosis not present

## 2019-07-01 DIAGNOSIS — Z7189 Other specified counseling: Secondary | ICD-10-CM | POA: Diagnosis not present

## 2019-07-09 DIAGNOSIS — Z79899 Other long term (current) drug therapy: Secondary | ICD-10-CM | POA: Diagnosis not present

## 2019-07-09 DIAGNOSIS — E039 Hypothyroidism, unspecified: Secondary | ICD-10-CM | POA: Diagnosis not present

## 2019-07-09 DIAGNOSIS — I10 Essential (primary) hypertension: Secondary | ICD-10-CM | POA: Diagnosis not present

## 2019-07-19 DIAGNOSIS — M159 Polyosteoarthritis, unspecified: Secondary | ICD-10-CM | POA: Diagnosis not present

## 2019-07-21 DIAGNOSIS — I10 Essential (primary) hypertension: Secondary | ICD-10-CM | POA: Diagnosis not present

## 2019-07-21 DIAGNOSIS — J449 Chronic obstructive pulmonary disease, unspecified: Secondary | ICD-10-CM | POA: Diagnosis not present

## 2019-07-21 DIAGNOSIS — Z742 Need for assistance at home and no other household member able to render care: Secondary | ICD-10-CM | POA: Diagnosis not present

## 2019-07-27 DIAGNOSIS — I5030 Unspecified diastolic (congestive) heart failure: Secondary | ICD-10-CM | POA: Diagnosis not present

## 2019-07-27 DIAGNOSIS — I82499 Acute embolism and thrombosis of other specified deep vein of unspecified lower extremity: Secondary | ICD-10-CM | POA: Diagnosis not present

## 2019-07-27 DIAGNOSIS — G4733 Obstructive sleep apnea (adult) (pediatric): Secondary | ICD-10-CM | POA: Diagnosis not present

## 2019-07-27 DIAGNOSIS — J449 Chronic obstructive pulmonary disease, unspecified: Secondary | ICD-10-CM | POA: Diagnosis not present

## 2019-08-03 DIAGNOSIS — I5030 Unspecified diastolic (congestive) heart failure: Secondary | ICD-10-CM | POA: Diagnosis not present

## 2019-08-03 DIAGNOSIS — G4733 Obstructive sleep apnea (adult) (pediatric): Secondary | ICD-10-CM | POA: Diagnosis not present

## 2019-08-03 DIAGNOSIS — R269 Unspecified abnormalities of gait and mobility: Secondary | ICD-10-CM | POA: Diagnosis not present

## 2019-08-03 DIAGNOSIS — J449 Chronic obstructive pulmonary disease, unspecified: Secondary | ICD-10-CM | POA: Diagnosis not present

## 2019-08-03 DIAGNOSIS — I82499 Acute embolism and thrombosis of other specified deep vein of unspecified lower extremity: Secondary | ICD-10-CM | POA: Diagnosis not present

## 2019-08-17 DIAGNOSIS — R269 Unspecified abnormalities of gait and mobility: Secondary | ICD-10-CM | POA: Diagnosis not present

## 2019-08-17 DIAGNOSIS — I5030 Unspecified diastolic (congestive) heart failure: Secondary | ICD-10-CM | POA: Diagnosis not present

## 2019-08-17 DIAGNOSIS — I82499 Acute embolism and thrombosis of other specified deep vein of unspecified lower extremity: Secondary | ICD-10-CM | POA: Diagnosis not present

## 2019-08-17 DIAGNOSIS — G4733 Obstructive sleep apnea (adult) (pediatric): Secondary | ICD-10-CM | POA: Diagnosis not present

## 2019-08-17 DIAGNOSIS — J449 Chronic obstructive pulmonary disease, unspecified: Secondary | ICD-10-CM | POA: Diagnosis not present

## 2019-08-19 DIAGNOSIS — R0600 Dyspnea, unspecified: Secondary | ICD-10-CM | POA: Diagnosis not present

## 2019-08-19 DIAGNOSIS — J984 Other disorders of lung: Secondary | ICD-10-CM | POA: Diagnosis not present

## 2019-08-19 DIAGNOSIS — E875 Hyperkalemia: Secondary | ICD-10-CM | POA: Diagnosis not present

## 2019-08-19 DIAGNOSIS — I5032 Chronic diastolic (congestive) heart failure: Secondary | ICD-10-CM | POA: Diagnosis not present

## 2019-08-19 DIAGNOSIS — Z87891 Personal history of nicotine dependence: Secondary | ICD-10-CM | POA: Diagnosis not present

## 2019-08-19 DIAGNOSIS — R001 Bradycardia, unspecified: Secondary | ICD-10-CM | POA: Diagnosis not present

## 2019-08-19 DIAGNOSIS — E785 Hyperlipidemia, unspecified: Secondary | ICD-10-CM | POA: Diagnosis not present

## 2019-08-19 DIAGNOSIS — N3000 Acute cystitis without hematuria: Secondary | ICD-10-CM | POA: Diagnosis not present

## 2019-08-19 DIAGNOSIS — D649 Anemia, unspecified: Secondary | ICD-10-CM | POA: Diagnosis not present

## 2019-08-19 DIAGNOSIS — J449 Chronic obstructive pulmonary disease, unspecified: Secondary | ICD-10-CM | POA: Diagnosis not present

## 2019-08-19 DIAGNOSIS — Z960 Presence of urogenital implants: Secondary | ICD-10-CM | POA: Diagnosis not present

## 2019-08-19 DIAGNOSIS — E039 Hypothyroidism, unspecified: Secondary | ICD-10-CM | POA: Diagnosis not present

## 2019-08-19 DIAGNOSIS — I878 Other specified disorders of veins: Secondary | ICD-10-CM | POA: Diagnosis not present

## 2019-08-19 DIAGNOSIS — D61818 Other pancytopenia: Secondary | ICD-10-CM | POA: Diagnosis not present

## 2019-08-19 DIAGNOSIS — W19XXXA Unspecified fall, initial encounter: Secondary | ICD-10-CM | POA: Diagnosis not present

## 2019-08-19 DIAGNOSIS — Z9181 History of falling: Secondary | ICD-10-CM | POA: Diagnosis not present

## 2019-08-19 DIAGNOSIS — I700 Atherosclerosis of aorta: Secondary | ICD-10-CM | POA: Diagnosis not present

## 2019-08-19 DIAGNOSIS — N179 Acute kidney failure, unspecified: Secondary | ICD-10-CM | POA: Diagnosis not present

## 2019-08-19 DIAGNOSIS — K5190 Ulcerative colitis, unspecified, without complications: Secondary | ICD-10-CM | POA: Diagnosis not present

## 2019-08-19 DIAGNOSIS — R531 Weakness: Secondary | ICD-10-CM | POA: Diagnosis not present

## 2019-08-19 DIAGNOSIS — I5033 Acute on chronic diastolic (congestive) heart failure: Secondary | ICD-10-CM | POA: Diagnosis not present

## 2019-08-19 DIAGNOSIS — N390 Urinary tract infection, site not specified: Secondary | ICD-10-CM | POA: Diagnosis not present

## 2019-08-19 DIAGNOSIS — I4510 Unspecified right bundle-branch block: Secondary | ICD-10-CM | POA: Diagnosis not present

## 2019-08-19 DIAGNOSIS — R5381 Other malaise: Secondary | ICD-10-CM | POA: Diagnosis not present

## 2019-08-19 DIAGNOSIS — Z20822 Contact with and (suspected) exposure to covid-19: Secondary | ICD-10-CM | POA: Diagnosis not present

## 2019-08-19 DIAGNOSIS — I2510 Atherosclerotic heart disease of native coronary artery without angina pectoris: Secondary | ICD-10-CM | POA: Diagnosis not present

## 2019-08-19 DIAGNOSIS — I10 Essential (primary) hypertension: Secondary | ICD-10-CM | POA: Diagnosis not present

## 2019-08-19 DIAGNOSIS — N189 Chronic kidney disease, unspecified: Secondary | ICD-10-CM | POA: Diagnosis not present

## 2019-08-20 DIAGNOSIS — I5020 Unspecified systolic (congestive) heart failure: Secondary | ICD-10-CM | POA: Diagnosis not present

## 2019-08-20 DIAGNOSIS — M6281 Muscle weakness (generalized): Secondary | ICD-10-CM | POA: Diagnosis not present

## 2019-08-20 DIAGNOSIS — R1909 Other intra-abdominal and pelvic swelling, mass and lump: Secondary | ICD-10-CM | POA: Diagnosis not present

## 2019-08-20 DIAGNOSIS — R680 Hypothermia, not associated with low environmental temperature: Secondary | ICD-10-CM | POA: Diagnosis not present

## 2019-08-20 DIAGNOSIS — R5381 Other malaise: Secondary | ICD-10-CM | POA: Diagnosis not present

## 2019-08-20 DIAGNOSIS — K5190 Ulcerative colitis, unspecified, without complications: Secondary | ICD-10-CM | POA: Diagnosis not present

## 2019-08-20 DIAGNOSIS — I82499 Acute embolism and thrombosis of other specified deep vein of unspecified lower extremity: Secondary | ICD-10-CM | POA: Diagnosis not present

## 2019-08-20 DIAGNOSIS — N3000 Acute cystitis without hematuria: Secondary | ICD-10-CM | POA: Diagnosis not present

## 2019-08-20 DIAGNOSIS — Z87891 Personal history of nicotine dependence: Secondary | ICD-10-CM | POA: Diagnosis not present

## 2019-08-20 DIAGNOSIS — I5032 Chronic diastolic (congestive) heart failure: Secondary | ICD-10-CM | POA: Diagnosis not present

## 2019-08-20 DIAGNOSIS — I700 Atherosclerosis of aorta: Secondary | ICD-10-CM | POA: Diagnosis not present

## 2019-08-20 DIAGNOSIS — N390 Urinary tract infection, site not specified: Secondary | ICD-10-CM | POA: Diagnosis not present

## 2019-08-20 DIAGNOSIS — I471 Supraventricular tachycardia: Secondary | ICD-10-CM | POA: Diagnosis not present

## 2019-08-20 DIAGNOSIS — R52 Pain, unspecified: Secondary | ICD-10-CM | POA: Diagnosis not present

## 2019-08-20 DIAGNOSIS — J449 Chronic obstructive pulmonary disease, unspecified: Secondary | ICD-10-CM | POA: Diagnosis not present

## 2019-08-20 DIAGNOSIS — G4733 Obstructive sleep apnea (adult) (pediatric): Secondary | ICD-10-CM | POA: Diagnosis not present

## 2019-08-20 DIAGNOSIS — Z20822 Contact with and (suspected) exposure to covid-19: Secondary | ICD-10-CM | POA: Diagnosis not present

## 2019-08-20 DIAGNOSIS — Z9181 History of falling: Secondary | ICD-10-CM | POA: Diagnosis not present

## 2019-08-20 DIAGNOSIS — E875 Hyperkalemia: Secondary | ICD-10-CM | POA: Diagnosis not present

## 2019-08-20 DIAGNOSIS — R531 Weakness: Secondary | ICD-10-CM | POA: Diagnosis not present

## 2019-08-20 DIAGNOSIS — N189 Chronic kidney disease, unspecified: Secondary | ICD-10-CM | POA: Diagnosis not present

## 2019-08-20 DIAGNOSIS — D61818 Other pancytopenia: Secondary | ICD-10-CM | POA: Diagnosis not present

## 2019-08-20 DIAGNOSIS — R279 Unspecified lack of coordination: Secondary | ICD-10-CM | POA: Diagnosis not present

## 2019-08-20 DIAGNOSIS — R269 Unspecified abnormalities of gait and mobility: Secondary | ICD-10-CM | POA: Diagnosis not present

## 2019-08-20 DIAGNOSIS — I878 Other specified disorders of veins: Secondary | ICD-10-CM | POA: Diagnosis not present

## 2019-08-20 DIAGNOSIS — I4510 Unspecified right bundle-branch block: Secondary | ICD-10-CM | POA: Diagnosis not present

## 2019-08-20 DIAGNOSIS — D649 Anemia, unspecified: Secondary | ICD-10-CM | POA: Diagnosis not present

## 2019-08-20 DIAGNOSIS — K219 Gastro-esophageal reflux disease without esophagitis: Secondary | ICD-10-CM | POA: Diagnosis not present

## 2019-08-20 DIAGNOSIS — N179 Acute kidney failure, unspecified: Secondary | ICD-10-CM | POA: Diagnosis not present

## 2019-08-20 DIAGNOSIS — R001 Bradycardia, unspecified: Secondary | ICD-10-CM | POA: Diagnosis not present

## 2019-08-20 DIAGNOSIS — E039 Hypothyroidism, unspecified: Secondary | ICD-10-CM | POA: Diagnosis not present

## 2019-08-20 DIAGNOSIS — I5030 Unspecified diastolic (congestive) heart failure: Secondary | ICD-10-CM | POA: Diagnosis not present

## 2019-08-20 DIAGNOSIS — I2510 Atherosclerotic heart disease of native coronary artery without angina pectoris: Secondary | ICD-10-CM | POA: Diagnosis not present

## 2019-08-20 DIAGNOSIS — E785 Hyperlipidemia, unspecified: Secondary | ICD-10-CM | POA: Diagnosis not present

## 2019-08-20 DIAGNOSIS — R278 Other lack of coordination: Secondary | ICD-10-CM | POA: Diagnosis not present

## 2019-08-20 DIAGNOSIS — I87329 Chronic venous hypertension (idiopathic) with inflammation of unspecified lower extremity: Secondary | ICD-10-CM | POA: Diagnosis not present

## 2019-08-20 DIAGNOSIS — Z86010 Personal history of colonic polyps: Secondary | ICD-10-CM | POA: Diagnosis not present

## 2019-08-23 DIAGNOSIS — K5190 Ulcerative colitis, unspecified, without complications: Secondary | ICD-10-CM | POA: Diagnosis not present

## 2019-08-23 DIAGNOSIS — E039 Hypothyroidism, unspecified: Secondary | ICD-10-CM | POA: Diagnosis not present

## 2019-08-23 DIAGNOSIS — Z86010 Personal history of colonic polyps: Secondary | ICD-10-CM | POA: Diagnosis not present

## 2019-08-23 DIAGNOSIS — M6281 Muscle weakness (generalized): Secondary | ICD-10-CM | POA: Diagnosis not present

## 2019-08-31 DIAGNOSIS — K5190 Ulcerative colitis, unspecified, without complications: Secondary | ICD-10-CM | POA: Diagnosis not present

## 2019-08-31 DIAGNOSIS — I5020 Unspecified systolic (congestive) heart failure: Secondary | ICD-10-CM | POA: Diagnosis not present

## 2019-08-31 DIAGNOSIS — M6281 Muscle weakness (generalized): Secondary | ICD-10-CM | POA: Diagnosis not present

## 2019-09-03 DIAGNOSIS — E039 Hypothyroidism, unspecified: Secondary | ICD-10-CM | POA: Diagnosis not present

## 2019-09-03 DIAGNOSIS — Z86010 Personal history of colonic polyps: Secondary | ICD-10-CM | POA: Diagnosis not present

## 2019-09-03 DIAGNOSIS — R5381 Other malaise: Secondary | ICD-10-CM | POA: Diagnosis not present

## 2019-09-03 DIAGNOSIS — M6281 Muscle weakness (generalized): Secondary | ICD-10-CM | POA: Diagnosis not present

## 2019-09-07 DIAGNOSIS — I5020 Unspecified systolic (congestive) heart failure: Secondary | ICD-10-CM | POA: Diagnosis not present

## 2019-09-07 DIAGNOSIS — M6281 Muscle weakness (generalized): Secondary | ICD-10-CM | POA: Diagnosis not present

## 2019-09-07 DIAGNOSIS — R52 Pain, unspecified: Secondary | ICD-10-CM | POA: Diagnosis not present

## 2019-09-07 DIAGNOSIS — J449 Chronic obstructive pulmonary disease, unspecified: Secondary | ICD-10-CM | POA: Diagnosis not present

## 2019-09-08 DIAGNOSIS — I471 Supraventricular tachycardia: Secondary | ICD-10-CM | POA: Diagnosis not present

## 2019-09-08 DIAGNOSIS — R680 Hypothermia, not associated with low environmental temperature: Secondary | ICD-10-CM | POA: Diagnosis not present

## 2019-09-08 DIAGNOSIS — K219 Gastro-esophageal reflux disease without esophagitis: Secondary | ICD-10-CM | POA: Diagnosis not present

## 2019-09-08 DIAGNOSIS — I340 Nonrheumatic mitral (valve) insufficiency: Secondary | ICD-10-CM | POA: Diagnosis not present

## 2019-09-08 DIAGNOSIS — E039 Hypothyroidism, unspecified: Secondary | ICD-10-CM | POA: Diagnosis not present

## 2019-09-08 DIAGNOSIS — Z9981 Dependence on supplemental oxygen: Secondary | ICD-10-CM | POA: Diagnosis not present

## 2019-09-08 DIAGNOSIS — I130 Hypertensive heart and chronic kidney disease with heart failure and stage 1 through stage 4 chronic kidney disease, or unspecified chronic kidney disease: Secondary | ICD-10-CM | POA: Diagnosis not present

## 2019-09-08 DIAGNOSIS — I5032 Chronic diastolic (congestive) heart failure: Secondary | ICD-10-CM | POA: Diagnosis not present

## 2019-09-08 DIAGNOSIS — J449 Chronic obstructive pulmonary disease, unspecified: Secondary | ICD-10-CM | POA: Diagnosis not present

## 2019-09-08 DIAGNOSIS — I872 Venous insufficiency (chronic) (peripheral): Secondary | ICD-10-CM | POA: Diagnosis not present

## 2019-09-08 DIAGNOSIS — N189 Chronic kidney disease, unspecified: Secondary | ICD-10-CM | POA: Diagnosis not present

## 2019-09-08 DIAGNOSIS — N179 Acute kidney failure, unspecified: Secondary | ICD-10-CM | POA: Diagnosis not present

## 2019-09-08 DIAGNOSIS — I5020 Unspecified systolic (congestive) heart failure: Secondary | ICD-10-CM | POA: Diagnosis not present

## 2019-09-08 DIAGNOSIS — R1909 Other intra-abdominal and pelvic swelling, mass and lump: Secondary | ICD-10-CM | POA: Diagnosis not present

## 2019-09-08 DIAGNOSIS — K5190 Ulcerative colitis, unspecified, without complications: Secondary | ICD-10-CM | POA: Diagnosis not present

## 2019-09-08 DIAGNOSIS — Z87440 Personal history of urinary (tract) infections: Secondary | ICD-10-CM | POA: Diagnosis not present

## 2019-09-08 DIAGNOSIS — D61818 Other pancytopenia: Secondary | ICD-10-CM | POA: Diagnosis not present

## 2019-09-08 DIAGNOSIS — D631 Anemia in chronic kidney disease: Secondary | ICD-10-CM | POA: Diagnosis not present

## 2019-09-09 DIAGNOSIS — I471 Supraventricular tachycardia: Secondary | ICD-10-CM | POA: Diagnosis not present

## 2019-09-09 DIAGNOSIS — R1909 Other intra-abdominal and pelvic swelling, mass and lump: Secondary | ICD-10-CM | POA: Diagnosis not present

## 2019-09-09 DIAGNOSIS — K5190 Ulcerative colitis, unspecified, without complications: Secondary | ICD-10-CM | POA: Diagnosis not present

## 2019-09-09 DIAGNOSIS — I130 Hypertensive heart and chronic kidney disease with heart failure and stage 1 through stage 4 chronic kidney disease, or unspecified chronic kidney disease: Secondary | ICD-10-CM | POA: Diagnosis not present

## 2019-09-09 DIAGNOSIS — D631 Anemia in chronic kidney disease: Secondary | ICD-10-CM | POA: Diagnosis not present

## 2019-09-09 DIAGNOSIS — Z87440 Personal history of urinary (tract) infections: Secondary | ICD-10-CM | POA: Diagnosis not present

## 2019-09-09 DIAGNOSIS — I5020 Unspecified systolic (congestive) heart failure: Secondary | ICD-10-CM | POA: Diagnosis not present

## 2019-09-09 DIAGNOSIS — E039 Hypothyroidism, unspecified: Secondary | ICD-10-CM | POA: Diagnosis not present

## 2019-09-09 DIAGNOSIS — K219 Gastro-esophageal reflux disease without esophagitis: Secondary | ICD-10-CM | POA: Diagnosis not present

## 2019-09-09 DIAGNOSIS — I5032 Chronic diastolic (congestive) heart failure: Secondary | ICD-10-CM | POA: Diagnosis not present

## 2019-09-09 DIAGNOSIS — N189 Chronic kidney disease, unspecified: Secondary | ICD-10-CM | POA: Diagnosis not present

## 2019-09-09 DIAGNOSIS — R680 Hypothermia, not associated with low environmental temperature: Secondary | ICD-10-CM | POA: Diagnosis not present

## 2019-09-09 DIAGNOSIS — N179 Acute kidney failure, unspecified: Secondary | ICD-10-CM | POA: Diagnosis not present

## 2019-09-09 DIAGNOSIS — D61818 Other pancytopenia: Secondary | ICD-10-CM | POA: Diagnosis not present

## 2019-09-09 DIAGNOSIS — Z9981 Dependence on supplemental oxygen: Secondary | ICD-10-CM | POA: Diagnosis not present

## 2019-09-09 DIAGNOSIS — J449 Chronic obstructive pulmonary disease, unspecified: Secondary | ICD-10-CM | POA: Diagnosis not present

## 2019-09-09 DIAGNOSIS — I872 Venous insufficiency (chronic) (peripheral): Secondary | ICD-10-CM | POA: Diagnosis not present

## 2019-09-09 DIAGNOSIS — I340 Nonrheumatic mitral (valve) insufficiency: Secondary | ICD-10-CM | POA: Diagnosis not present

## 2019-09-10 DIAGNOSIS — K219 Gastro-esophageal reflux disease without esophagitis: Secondary | ICD-10-CM | POA: Diagnosis not present

## 2019-09-10 DIAGNOSIS — J449 Chronic obstructive pulmonary disease, unspecified: Secondary | ICD-10-CM | POA: Diagnosis not present

## 2019-09-10 DIAGNOSIS — I340 Nonrheumatic mitral (valve) insufficiency: Secondary | ICD-10-CM | POA: Diagnosis not present

## 2019-09-10 DIAGNOSIS — E039 Hypothyroidism, unspecified: Secondary | ICD-10-CM | POA: Diagnosis not present

## 2019-09-10 DIAGNOSIS — D61818 Other pancytopenia: Secondary | ICD-10-CM | POA: Diagnosis not present

## 2019-09-10 DIAGNOSIS — Z9981 Dependence on supplemental oxygen: Secondary | ICD-10-CM | POA: Diagnosis not present

## 2019-09-10 DIAGNOSIS — I872 Venous insufficiency (chronic) (peripheral): Secondary | ICD-10-CM | POA: Diagnosis not present

## 2019-09-10 DIAGNOSIS — N179 Acute kidney failure, unspecified: Secondary | ICD-10-CM | POA: Diagnosis not present

## 2019-09-10 DIAGNOSIS — I5032 Chronic diastolic (congestive) heart failure: Secondary | ICD-10-CM | POA: Diagnosis not present

## 2019-09-10 DIAGNOSIS — R1909 Other intra-abdominal and pelvic swelling, mass and lump: Secondary | ICD-10-CM | POA: Diagnosis not present

## 2019-09-10 DIAGNOSIS — I471 Supraventricular tachycardia: Secondary | ICD-10-CM | POA: Diagnosis not present

## 2019-09-10 DIAGNOSIS — I5020 Unspecified systolic (congestive) heart failure: Secondary | ICD-10-CM | POA: Diagnosis not present

## 2019-09-10 DIAGNOSIS — Z87440 Personal history of urinary (tract) infections: Secondary | ICD-10-CM | POA: Diagnosis not present

## 2019-09-10 DIAGNOSIS — I130 Hypertensive heart and chronic kidney disease with heart failure and stage 1 through stage 4 chronic kidney disease, or unspecified chronic kidney disease: Secondary | ICD-10-CM | POA: Diagnosis not present

## 2019-09-10 DIAGNOSIS — R680 Hypothermia, not associated with low environmental temperature: Secondary | ICD-10-CM | POA: Diagnosis not present

## 2019-09-10 DIAGNOSIS — N189 Chronic kidney disease, unspecified: Secondary | ICD-10-CM | POA: Diagnosis not present

## 2019-09-10 DIAGNOSIS — K5190 Ulcerative colitis, unspecified, without complications: Secondary | ICD-10-CM | POA: Diagnosis not present

## 2019-09-10 DIAGNOSIS — D631 Anemia in chronic kidney disease: Secondary | ICD-10-CM | POA: Diagnosis not present

## 2019-09-14 DIAGNOSIS — R970 Elevated carcinoembryonic antigen [CEA]: Secondary | ICD-10-CM | POA: Diagnosis not present

## 2019-09-14 DIAGNOSIS — E039 Hypothyroidism, unspecified: Secondary | ICD-10-CM | POA: Diagnosis not present

## 2019-09-14 DIAGNOSIS — K51011 Ulcerative (chronic) pancolitis with rectal bleeding: Secondary | ICD-10-CM | POA: Diagnosis not present

## 2019-09-16 DIAGNOSIS — R600 Localized edema: Secondary | ICD-10-CM | POA: Diagnosis not present

## 2019-09-16 DIAGNOSIS — K5190 Ulcerative colitis, unspecified, without complications: Secondary | ICD-10-CM | POA: Diagnosis not present

## 2019-09-16 DIAGNOSIS — I5030 Unspecified diastolic (congestive) heart failure: Secondary | ICD-10-CM | POA: Diagnosis not present

## 2019-09-16 DIAGNOSIS — Z9181 History of falling: Secondary | ICD-10-CM | POA: Diagnosis not present

## 2019-09-16 DIAGNOSIS — J449 Chronic obstructive pulmonary disease, unspecified: Secondary | ICD-10-CM | POA: Diagnosis not present

## 2019-09-16 DIAGNOSIS — D509 Iron deficiency anemia, unspecified: Secondary | ICD-10-CM | POA: Diagnosis not present

## 2019-09-16 DIAGNOSIS — Z79899 Other long term (current) drug therapy: Secondary | ICD-10-CM | POA: Diagnosis not present

## 2019-09-16 DIAGNOSIS — E039 Hypothyroidism, unspecified: Secondary | ICD-10-CM | POA: Diagnosis not present

## 2019-09-17 DIAGNOSIS — K5190 Ulcerative colitis, unspecified, without complications: Secondary | ICD-10-CM | POA: Diagnosis not present

## 2019-09-17 DIAGNOSIS — I471 Supraventricular tachycardia: Secondary | ICD-10-CM | POA: Diagnosis not present

## 2019-09-17 DIAGNOSIS — J449 Chronic obstructive pulmonary disease, unspecified: Secondary | ICD-10-CM | POA: Diagnosis not present

## 2019-09-17 DIAGNOSIS — I5020 Unspecified systolic (congestive) heart failure: Secondary | ICD-10-CM | POA: Diagnosis not present

## 2019-09-17 DIAGNOSIS — E039 Hypothyroidism, unspecified: Secondary | ICD-10-CM | POA: Diagnosis not present

## 2019-09-17 DIAGNOSIS — Z9981 Dependence on supplemental oxygen: Secondary | ICD-10-CM | POA: Diagnosis not present

## 2019-09-17 DIAGNOSIS — I5032 Chronic diastolic (congestive) heart failure: Secondary | ICD-10-CM | POA: Diagnosis not present

## 2019-09-17 DIAGNOSIS — I340 Nonrheumatic mitral (valve) insufficiency: Secondary | ICD-10-CM | POA: Diagnosis not present

## 2019-09-17 DIAGNOSIS — K219 Gastro-esophageal reflux disease without esophagitis: Secondary | ICD-10-CM | POA: Diagnosis not present

## 2019-09-17 DIAGNOSIS — D61818 Other pancytopenia: Secondary | ICD-10-CM | POA: Diagnosis not present

## 2019-09-17 DIAGNOSIS — R1909 Other intra-abdominal and pelvic swelling, mass and lump: Secondary | ICD-10-CM | POA: Diagnosis not present

## 2019-09-17 DIAGNOSIS — N189 Chronic kidney disease, unspecified: Secondary | ICD-10-CM | POA: Diagnosis not present

## 2019-09-17 DIAGNOSIS — I130 Hypertensive heart and chronic kidney disease with heart failure and stage 1 through stage 4 chronic kidney disease, or unspecified chronic kidney disease: Secondary | ICD-10-CM | POA: Diagnosis not present

## 2019-09-17 DIAGNOSIS — D631 Anemia in chronic kidney disease: Secondary | ICD-10-CM | POA: Diagnosis not present

## 2019-09-17 DIAGNOSIS — I872 Venous insufficiency (chronic) (peripheral): Secondary | ICD-10-CM | POA: Diagnosis not present

## 2019-09-17 DIAGNOSIS — R680 Hypothermia, not associated with low environmental temperature: Secondary | ICD-10-CM | POA: Diagnosis not present

## 2019-09-17 DIAGNOSIS — N179 Acute kidney failure, unspecified: Secondary | ICD-10-CM | POA: Diagnosis not present

## 2019-09-17 DIAGNOSIS — Z87440 Personal history of urinary (tract) infections: Secondary | ICD-10-CM | POA: Diagnosis not present

## 2019-09-21 ENCOUNTER — Telehealth: Payer: Self-pay | Admitting: Cardiology

## 2019-09-21 DIAGNOSIS — D61818 Other pancytopenia: Secondary | ICD-10-CM | POA: Diagnosis not present

## 2019-09-21 DIAGNOSIS — E039 Hypothyroidism, unspecified: Secondary | ICD-10-CM | POA: Diagnosis not present

## 2019-09-21 DIAGNOSIS — I5032 Chronic diastolic (congestive) heart failure: Secondary | ICD-10-CM | POA: Diagnosis not present

## 2019-09-21 DIAGNOSIS — R1909 Other intra-abdominal and pelvic swelling, mass and lump: Secondary | ICD-10-CM | POA: Diagnosis not present

## 2019-09-21 DIAGNOSIS — R680 Hypothermia, not associated with low environmental temperature: Secondary | ICD-10-CM | POA: Diagnosis not present

## 2019-09-21 DIAGNOSIS — Z9981 Dependence on supplemental oxygen: Secondary | ICD-10-CM | POA: Diagnosis not present

## 2019-09-21 DIAGNOSIS — I340 Nonrheumatic mitral (valve) insufficiency: Secondary | ICD-10-CM | POA: Diagnosis not present

## 2019-09-21 DIAGNOSIS — Z87440 Personal history of urinary (tract) infections: Secondary | ICD-10-CM | POA: Diagnosis not present

## 2019-09-21 DIAGNOSIS — I5020 Unspecified systolic (congestive) heart failure: Secondary | ICD-10-CM | POA: Diagnosis not present

## 2019-09-21 DIAGNOSIS — N189 Chronic kidney disease, unspecified: Secondary | ICD-10-CM | POA: Diagnosis not present

## 2019-09-21 DIAGNOSIS — I872 Venous insufficiency (chronic) (peripheral): Secondary | ICD-10-CM | POA: Diagnosis not present

## 2019-09-21 DIAGNOSIS — K5190 Ulcerative colitis, unspecified, without complications: Secondary | ICD-10-CM | POA: Diagnosis not present

## 2019-09-21 DIAGNOSIS — I471 Supraventricular tachycardia: Secondary | ICD-10-CM | POA: Diagnosis not present

## 2019-09-21 DIAGNOSIS — D631 Anemia in chronic kidney disease: Secondary | ICD-10-CM | POA: Diagnosis not present

## 2019-09-21 DIAGNOSIS — I130 Hypertensive heart and chronic kidney disease with heart failure and stage 1 through stage 4 chronic kidney disease, or unspecified chronic kidney disease: Secondary | ICD-10-CM | POA: Diagnosis not present

## 2019-09-21 DIAGNOSIS — K219 Gastro-esophageal reflux disease without esophagitis: Secondary | ICD-10-CM | POA: Diagnosis not present

## 2019-09-21 DIAGNOSIS — N179 Acute kidney failure, unspecified: Secondary | ICD-10-CM | POA: Diagnosis not present

## 2019-09-21 DIAGNOSIS — J449 Chronic obstructive pulmonary disease, unspecified: Secondary | ICD-10-CM | POA: Diagnosis not present

## 2019-09-21 NOTE — Telephone Encounter (Signed)
Left the caregiver [name] a message to call the office back to offer a sooner appt for the pt. Held the pt a slot for 09/28/19 at 3:15 pm with Cecilie Kicks NP. That is the very next available appt with our office.  Pt will need an OV for proper clearance.  If [name] calls back, scheduling/operator can add this pt to Laura's schedule for 09/28/19 at 3:15 pm, and put in appt note "ok to add per Jhamari Markowicz."

## 2019-09-21 NOTE — Telephone Encounter (Signed)
I will send call to Primary Card nurse for further evaluation of pt symptoms as pt states not feeling well.

## 2019-09-21 NOTE — Telephone Encounter (Signed)
New Message   I called to schedule referral appt  Pt was doing a breathing treatment so I scheduled with her care giver who was with her, Wilburt Finlay. [name] is wondering if the patient can be seen earlier then scheduled appt on 04/15 with PA Bhagat She says the pt is needing a colonoscopy.  She is not doing well and says the last care giver did not care for her properly.     Please call

## 2019-09-22 NOTE — Telephone Encounter (Signed)
Left [name] a message to call back, from yesterday's message she left with our scheduling dept, on this pt. [name] is noted as the pts current caregiver at this time.  This is the 2nd attempt at trying to make contact with [name].

## 2019-09-23 ENCOUNTER — Telehealth: Payer: Self-pay

## 2019-09-23 ENCOUNTER — Telehealth: Payer: Self-pay | Admitting: Physician Assistant

## 2019-09-23 NOTE — Telephone Encounter (Signed)
New Message     Pts caregiver says she will need to assist the pt to her appt because she uses a wheel chair    Please advise

## 2019-09-23 NOTE — Telephone Encounter (Signed)
Jamie Montoya, the pts caregiver called back. Pt is now scheduled with Vin Bhagat PA-C for next Wednesday 4/7 at 3:15 pm.  Reynold Bowen will accompany the pt up to her appt, for she is wheelchair bound. This was updated in appt notes.

## 2019-09-23 NOTE — Telephone Encounter (Signed)
[name], the pts caregiver called back. Pt is now scheduled with Vin Bhagat PA-C for next Wednesday 4/7 at 3:15 pm.  Jamie Montoya will accompany the pt up to her appt, for she is wheelchair bound. This was updated in appt notes.

## 2019-09-23 NOTE — Telephone Encounter (Signed)
Follow up     Jamie Montoya is returning call

## 2019-09-23 NOTE — Telephone Encounter (Signed)
NOTES ON FILE FROM [facility] [phone], SENT REFERRAL TO [place]

## 2019-09-24 DIAGNOSIS — I5030 Unspecified diastolic (congestive) heart failure: Secondary | ICD-10-CM | POA: Diagnosis not present

## 2019-09-24 DIAGNOSIS — I340 Nonrheumatic mitral (valve) insufficiency: Secondary | ICD-10-CM | POA: Diagnosis not present

## 2019-09-24 DIAGNOSIS — I5020 Unspecified systolic (congestive) heart failure: Secondary | ICD-10-CM | POA: Diagnosis not present

## 2019-09-24 DIAGNOSIS — Z87440 Personal history of urinary (tract) infections: Secondary | ICD-10-CM | POA: Diagnosis not present

## 2019-09-24 DIAGNOSIS — K219 Gastro-esophageal reflux disease without esophagitis: Secondary | ICD-10-CM | POA: Diagnosis not present

## 2019-09-24 DIAGNOSIS — R1909 Other intra-abdominal and pelvic swelling, mass and lump: Secondary | ICD-10-CM | POA: Diagnosis not present

## 2019-09-24 DIAGNOSIS — I5032 Chronic diastolic (congestive) heart failure: Secondary | ICD-10-CM | POA: Diagnosis not present

## 2019-09-24 DIAGNOSIS — K5190 Ulcerative colitis, unspecified, without complications: Secondary | ICD-10-CM | POA: Diagnosis not present

## 2019-09-24 DIAGNOSIS — N189 Chronic kidney disease, unspecified: Secondary | ICD-10-CM | POA: Diagnosis not present

## 2019-09-24 DIAGNOSIS — R680 Hypothermia, not associated with low environmental temperature: Secondary | ICD-10-CM | POA: Diagnosis not present

## 2019-09-24 DIAGNOSIS — N179 Acute kidney failure, unspecified: Secondary | ICD-10-CM | POA: Diagnosis not present

## 2019-09-24 DIAGNOSIS — J449 Chronic obstructive pulmonary disease, unspecified: Secondary | ICD-10-CM | POA: Diagnosis not present

## 2019-09-24 DIAGNOSIS — I82499 Acute embolism and thrombosis of other specified deep vein of unspecified lower extremity: Secondary | ICD-10-CM | POA: Diagnosis not present

## 2019-09-24 DIAGNOSIS — D631 Anemia in chronic kidney disease: Secondary | ICD-10-CM | POA: Diagnosis not present

## 2019-09-24 DIAGNOSIS — E039 Hypothyroidism, unspecified: Secondary | ICD-10-CM | POA: Diagnosis not present

## 2019-09-24 DIAGNOSIS — G4733 Obstructive sleep apnea (adult) (pediatric): Secondary | ICD-10-CM | POA: Diagnosis not present

## 2019-09-24 DIAGNOSIS — D61818 Other pancytopenia: Secondary | ICD-10-CM | POA: Diagnosis not present

## 2019-09-24 DIAGNOSIS — I471 Supraventricular tachycardia: Secondary | ICD-10-CM | POA: Diagnosis not present

## 2019-09-24 DIAGNOSIS — Z9981 Dependence on supplemental oxygen: Secondary | ICD-10-CM | POA: Diagnosis not present

## 2019-09-24 DIAGNOSIS — I130 Hypertensive heart and chronic kidney disease with heart failure and stage 1 through stage 4 chronic kidney disease, or unspecified chronic kidney disease: Secondary | ICD-10-CM | POA: Diagnosis not present

## 2019-09-24 DIAGNOSIS — I872 Venous insufficiency (chronic) (peripheral): Secondary | ICD-10-CM | POA: Diagnosis not present

## 2019-09-27 DIAGNOSIS — E039 Hypothyroidism, unspecified: Secondary | ICD-10-CM | POA: Diagnosis not present

## 2019-09-27 DIAGNOSIS — I340 Nonrheumatic mitral (valve) insufficiency: Secondary | ICD-10-CM | POA: Diagnosis not present

## 2019-09-27 DIAGNOSIS — I5032 Chronic diastolic (congestive) heart failure: Secondary | ICD-10-CM | POA: Diagnosis not present

## 2019-09-27 DIAGNOSIS — N179 Acute kidney failure, unspecified: Secondary | ICD-10-CM | POA: Diagnosis not present

## 2019-09-27 DIAGNOSIS — R1909 Other intra-abdominal and pelvic swelling, mass and lump: Secondary | ICD-10-CM | POA: Diagnosis not present

## 2019-09-27 DIAGNOSIS — D61818 Other pancytopenia: Secondary | ICD-10-CM | POA: Diagnosis not present

## 2019-09-27 DIAGNOSIS — I130 Hypertensive heart and chronic kidney disease with heart failure and stage 1 through stage 4 chronic kidney disease, or unspecified chronic kidney disease: Secondary | ICD-10-CM | POA: Diagnosis not present

## 2019-09-27 DIAGNOSIS — N189 Chronic kidney disease, unspecified: Secondary | ICD-10-CM | POA: Diagnosis not present

## 2019-09-27 DIAGNOSIS — Z87440 Personal history of urinary (tract) infections: Secondary | ICD-10-CM | POA: Diagnosis not present

## 2019-09-27 DIAGNOSIS — K219 Gastro-esophageal reflux disease without esophagitis: Secondary | ICD-10-CM | POA: Diagnosis not present

## 2019-09-27 DIAGNOSIS — R680 Hypothermia, not associated with low environmental temperature: Secondary | ICD-10-CM | POA: Diagnosis not present

## 2019-09-27 DIAGNOSIS — Z9981 Dependence on supplemental oxygen: Secondary | ICD-10-CM | POA: Diagnosis not present

## 2019-09-27 DIAGNOSIS — I471 Supraventricular tachycardia: Secondary | ICD-10-CM | POA: Diagnosis not present

## 2019-09-27 DIAGNOSIS — D631 Anemia in chronic kidney disease: Secondary | ICD-10-CM | POA: Diagnosis not present

## 2019-09-27 DIAGNOSIS — J449 Chronic obstructive pulmonary disease, unspecified: Secondary | ICD-10-CM | POA: Diagnosis not present

## 2019-09-27 DIAGNOSIS — I5020 Unspecified systolic (congestive) heart failure: Secondary | ICD-10-CM | POA: Diagnosis not present

## 2019-09-27 DIAGNOSIS — I872 Venous insufficiency (chronic) (peripheral): Secondary | ICD-10-CM | POA: Diagnosis not present

## 2019-09-27 DIAGNOSIS — K5190 Ulcerative colitis, unspecified, without complications: Secondary | ICD-10-CM | POA: Diagnosis not present

## 2019-09-28 DIAGNOSIS — R971 Elevated cancer antigen 125 [CA 125]: Secondary | ICD-10-CM | POA: Diagnosis not present
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
## 2019-09-29 ENCOUNTER — Ambulatory Visit: Payer: Medicare Other | Admitting: Physician Assistant

## 2019-09-29 ENCOUNTER — Encounter: Payer: Self-pay | Admitting: Physician Assistant

## 2019-09-29 ENCOUNTER — Other Ambulatory Visit: Payer: Self-pay

## 2019-09-29 VITALS — BP 122/58 | HR 58 | Ht 64.0 in | Wt 234.0 lb

## 2019-09-29 DIAGNOSIS — I5033 Acute on chronic diastolic (congestive) heart failure: Secondary | ICD-10-CM | POA: Diagnosis not present

## 2019-09-29 DIAGNOSIS — N1831 Chronic kidney disease, stage 3a: Secondary | ICD-10-CM | POA: Diagnosis not present

## 2019-09-29 DIAGNOSIS — I5032 Chronic diastolic (congestive) heart failure: Secondary | ICD-10-CM

## 2019-09-29 DIAGNOSIS — E785 Hyperlipidemia, unspecified: Secondary | ICD-10-CM | POA: Diagnosis not present

## 2019-09-29 DIAGNOSIS — J42 Unspecified chronic bronchitis: Secondary | ICD-10-CM

## 2019-09-29 DIAGNOSIS — I471 Supraventricular tachycardia: Secondary | ICD-10-CM

## 2019-09-29 NOTE — Patient Instructions (Signed)
Medication Instructions:  Your physician recommends that you continue on your current medications as directed. Please refer to the Current Medication list given to you today.  *If you need a refill on your cardiac medications before your next appointment, please call your pharmacy*   Lab Work: TODAY:  BMET  If you have labs (blood work) drawn today and your tests are completely normal, you will receive your results only by: Marland Kitchen MyChart Message (if you have MyChart) OR . A paper copy in the mail If you have any lab test that is abnormal or we need to change your treatment, we will call you to review the results.   Testing/Procedures: None ordered   Follow-Up: At [facility], you and your health needs are our priority.  As part of our continuing mission to provide you with exceptional heart care, we have created designated Provider Care Teams.  These Care Teams include your primary Cardiologist (physician) and Advanced Practice Providers (APPs -  Physician Assistants and Nurse Practitioners) who all work together to provide you with the care you need, when you need it.  We recommend signing up for the patient portal called "MyChart".  Sign up information is provided on this After Visit Summary.  MyChart is used to connect with patients for Virtual Visits (Telemedicine).  Patients are able to view lab/test results, encounter notes, upcoming appointments, etc.  Non-urgent messages can be sent to your provider as well.   To learn more about what you can do with MyChart, go to [email or web address].    Your next appointment:   3 week(s)  10/20/2019 ARRIVE AT 2:00   The format for your next appointment:   In Person  Provider:   Robbie Lis, PA-C   Other Instructions

## 2019-09-29 NOTE — Addendum Note (Signed)
Addended by: Gaetano Net on: 09/29/2019 04:45 PM   Modules accepted: Orders

## 2019-09-30 DIAGNOSIS — D61818 Other pancytopenia: Secondary | ICD-10-CM | POA: Diagnosis not present

## 2019-09-30 DIAGNOSIS — E039 Hypothyroidism, unspecified: Secondary | ICD-10-CM | POA: Diagnosis not present

## 2019-09-30 DIAGNOSIS — D649 Anemia, unspecified: Secondary | ICD-10-CM | POA: Diagnosis not present

## 2019-09-30 LAB — BASIC METABOLIC PANEL WITH GFR
BUN/Creatinine Ratio: 33 — ABNORMAL HIGH (ref 12–28)
BUN: 76 mg/dL (ref 8–27)
CO2: 31 mmol/L — ABNORMAL HIGH (ref 20–29)
Calcium: 7 mg/dL — ABNORMAL LOW (ref 8.7–10.3)
Chloride: 95 mmol/L — ABNORMAL LOW (ref 96–106)
Creatinine, Ser: 2.27 mg/dL — ABNORMAL HIGH (ref 0.57–1.00)
GFR calc Af Amer: 24 mL/min/1.73 — ABNORMAL LOW
GFR calc non Af Amer: 21 mL/min/1.73 — ABNORMAL LOW
Glucose: 97 mg/dL (ref 65–99)
Potassium: 4.2 mmol/L (ref 3.5–5.2)
Sodium: 145 mmol/L — ABNORMAL HIGH (ref 134–144)

## 2019-09-30 LAB — PRO B NATRIURETIC PEPTIDE: NT-Pro BNP: 1266 pg/mL — ABNORMAL HIGH (ref 0–738)

## 2019-10-01 ENCOUNTER — Telehealth: Payer: Self-pay | Admitting: *Deleted

## 2019-10-01 DIAGNOSIS — R269 Unspecified abnormalities of gait and mobility: Secondary | ICD-10-CM | POA: Diagnosis not present

## 2019-10-01 DIAGNOSIS — Z87440 Personal history of urinary (tract) infections: Secondary | ICD-10-CM | POA: Diagnosis not present

## 2019-10-01 DIAGNOSIS — J449 Chronic obstructive pulmonary disease, unspecified: Secondary | ICD-10-CM | POA: Diagnosis not present

## 2019-10-01 DIAGNOSIS — I872 Venous insufficiency (chronic) (peripheral): Secondary | ICD-10-CM | POA: Diagnosis not present

## 2019-10-01 DIAGNOSIS — R799 Abnormal finding of blood chemistry, unspecified: Secondary | ICD-10-CM

## 2019-10-01 DIAGNOSIS — I471 Supraventricular tachycardia: Secondary | ICD-10-CM | POA: Diagnosis not present

## 2019-10-01 DIAGNOSIS — Z9981 Dependence on supplemental oxygen: Secondary | ICD-10-CM | POA: Diagnosis not present

## 2019-10-01 DIAGNOSIS — D61818 Other pancytopenia: Secondary | ICD-10-CM | POA: Diagnosis not present

## 2019-10-01 DIAGNOSIS — I5030 Unspecified diastolic (congestive) heart failure: Secondary | ICD-10-CM | POA: Diagnosis not present

## 2019-10-01 DIAGNOSIS — I82499 Acute embolism and thrombosis of other specified deep vein of unspecified lower extremity: Secondary | ICD-10-CM | POA: Diagnosis not present

## 2019-10-01 DIAGNOSIS — R7989 Other specified abnormal findings of blood chemistry: Secondary | ICD-10-CM

## 2019-10-01 DIAGNOSIS — N179 Acute kidney failure, unspecified: Secondary | ICD-10-CM | POA: Diagnosis not present

## 2019-10-01 DIAGNOSIS — K5190 Ulcerative colitis, unspecified, without complications: Secondary | ICD-10-CM | POA: Diagnosis not present

## 2019-10-01 DIAGNOSIS — I340 Nonrheumatic mitral (valve) insufficiency: Secondary | ICD-10-CM | POA: Diagnosis not present

## 2019-10-01 DIAGNOSIS — R1909 Other intra-abdominal and pelvic swelling, mass and lump: Secondary | ICD-10-CM | POA: Diagnosis not present

## 2019-10-01 DIAGNOSIS — K219 Gastro-esophageal reflux disease without esophagitis: Secondary | ICD-10-CM | POA: Diagnosis not present

## 2019-10-01 DIAGNOSIS — G4733 Obstructive sleep apnea (adult) (pediatric): Secondary | ICD-10-CM | POA: Diagnosis not present

## 2019-10-01 DIAGNOSIS — R680 Hypothermia, not associated with low environmental temperature: Secondary | ICD-10-CM | POA: Diagnosis not present

## 2019-10-01 DIAGNOSIS — I5032 Chronic diastolic (congestive) heart failure: Secondary | ICD-10-CM | POA: Diagnosis not present

## 2019-10-01 DIAGNOSIS — H903 Sensorineural hearing loss, bilateral: Secondary | ICD-10-CM | POA: Diagnosis not present

## 2019-10-01 DIAGNOSIS — E039 Hypothyroidism, unspecified: Secondary | ICD-10-CM | POA: Diagnosis not present

## 2019-10-01 DIAGNOSIS — I130 Hypertensive heart and chronic kidney disease with heart failure and stage 1 through stage 4 chronic kidney disease, or unspecified chronic kidney disease: Secondary | ICD-10-CM | POA: Diagnosis not present

## 2019-10-01 DIAGNOSIS — N189 Chronic kidney disease, unspecified: Secondary | ICD-10-CM | POA: Diagnosis not present

## 2019-10-01 DIAGNOSIS — I5020 Unspecified systolic (congestive) heart failure: Secondary | ICD-10-CM | POA: Diagnosis not present

## 2019-10-01 DIAGNOSIS — D631 Anemia in chronic kidney disease: Secondary | ICD-10-CM | POA: Diagnosis not present

## 2019-10-01 NOTE — Telephone Encounter (Signed)
-----   Message from [place], [place] sent at 10/01/2019  7:23 AM EDT ----- I hasn't heard from Dr. Meda Coffee yet. Anderson Malta is off today.   Seem she is dry. Advised to hold torsemide, take as needed. Repeat BMET in 5 days. She lives in [place], [place] closer.   ----- Message ----- From: Interface, [facility] Lab Results In Sent: 09/30/2019   6:36 AM EDT To: Leanor Kail, [place]

## 2019-10-01 NOTE — Telephone Encounter (Signed)
Spoke with the pt and informed her that per [employer], based on her labs, she seems to be dry, and he recommends that she HOLD her Torsemide, take it only as needed (pt education provided on when to take prn), and come in for repeat BMET in 5 days.   Scheduled the pt to come in for repeat BMET in 5 days on 10/06/19 at our office, at the request of the pt.  Placed a hold note on her torsemide, as advised by Vin Bhagat PA-C.  Pt verbalized understanding and agrees with this plan.

## 2019-10-02 DIAGNOSIS — M79672 Pain in left foot: Secondary | ICD-10-CM | POA: Diagnosis not present

## 2019-10-05 DIAGNOSIS — R1909 Other intra-abdominal and pelvic swelling, mass and lump: Secondary | ICD-10-CM | POA: Diagnosis not present

## 2019-10-05 DIAGNOSIS — E039 Hypothyroidism, unspecified: Secondary | ICD-10-CM | POA: Diagnosis not present

## 2019-10-05 DIAGNOSIS — I5020 Unspecified systolic (congestive) heart failure: Secondary | ICD-10-CM | POA: Diagnosis not present

## 2019-10-05 DIAGNOSIS — I130 Hypertensive heart and chronic kidney disease with heart failure and stage 1 through stage 4 chronic kidney disease, or unspecified chronic kidney disease: Secondary | ICD-10-CM | POA: Diagnosis not present

## 2019-10-05 DIAGNOSIS — D61818 Other pancytopenia: Secondary | ICD-10-CM | POA: Diagnosis not present

## 2019-10-05 DIAGNOSIS — N189 Chronic kidney disease, unspecified: Secondary | ICD-10-CM | POA: Diagnosis not present

## 2019-10-05 DIAGNOSIS — I340 Nonrheumatic mitral (valve) insufficiency: Secondary | ICD-10-CM | POA: Diagnosis not present

## 2019-10-05 DIAGNOSIS — K5190 Ulcerative colitis, unspecified, without complications: Secondary | ICD-10-CM | POA: Diagnosis not present

## 2019-10-05 DIAGNOSIS — I5032 Chronic diastolic (congestive) heart failure: Secondary | ICD-10-CM | POA: Diagnosis not present

## 2019-10-05 DIAGNOSIS — J449 Chronic obstructive pulmonary disease, unspecified: Secondary | ICD-10-CM | POA: Diagnosis not present

## 2019-10-05 DIAGNOSIS — Z9981 Dependence on supplemental oxygen: Secondary | ICD-10-CM | POA: Diagnosis not present

## 2019-10-05 DIAGNOSIS — D631 Anemia in chronic kidney disease: Secondary | ICD-10-CM | POA: Diagnosis not present

## 2019-10-05 DIAGNOSIS — N179 Acute kidney failure, unspecified: Secondary | ICD-10-CM | POA: Diagnosis not present

## 2019-10-05 DIAGNOSIS — R680 Hypothermia, not associated with low environmental temperature: Secondary | ICD-10-CM | POA: Diagnosis not present

## 2019-10-05 DIAGNOSIS — Z87440 Personal history of urinary (tract) infections: Secondary | ICD-10-CM | POA: Diagnosis not present

## 2019-10-05 DIAGNOSIS — K219 Gastro-esophageal reflux disease without esophagitis: Secondary | ICD-10-CM | POA: Diagnosis not present

## 2019-10-05 DIAGNOSIS — I471 Supraventricular tachycardia: Secondary | ICD-10-CM | POA: Diagnosis not present

## 2019-10-05 DIAGNOSIS — I872 Venous insufficiency (chronic) (peripheral): Secondary | ICD-10-CM | POA: Diagnosis not present

## 2019-10-06 ENCOUNTER — Other Ambulatory Visit: Payer: Medicare Other | Admitting: *Deleted

## 2019-10-06 ENCOUNTER — Other Ambulatory Visit: Payer: Self-pay

## 2019-10-06 DIAGNOSIS — N189 Chronic kidney disease, unspecified: Secondary | ICD-10-CM | POA: Diagnosis not present

## 2019-10-06 DIAGNOSIS — R799 Abnormal finding of blood chemistry, unspecified: Secondary | ICD-10-CM | POA: Diagnosis not present

## 2019-10-06 DIAGNOSIS — I471 Supraventricular tachycardia: Secondary | ICD-10-CM | POA: Diagnosis not present

## 2019-10-06 DIAGNOSIS — Z87440 Personal history of urinary (tract) infections: Secondary | ICD-10-CM | POA: Diagnosis not present

## 2019-10-06 DIAGNOSIS — I5032 Chronic diastolic (congestive) heart failure: Secondary | ICD-10-CM | POA: Diagnosis not present

## 2019-10-06 DIAGNOSIS — R680 Hypothermia, not associated with low environmental temperature: Secondary | ICD-10-CM | POA: Diagnosis not present

## 2019-10-06 DIAGNOSIS — Z9981 Dependence on supplemental oxygen: Secondary | ICD-10-CM | POA: Diagnosis not present

## 2019-10-06 DIAGNOSIS — R7989 Other specified abnormal findings of blood chemistry: Secondary | ICD-10-CM | POA: Diagnosis not present

## 2019-10-06 DIAGNOSIS — J449 Chronic obstructive pulmonary disease, unspecified: Secondary | ICD-10-CM | POA: Diagnosis not present

## 2019-10-06 DIAGNOSIS — R1909 Other intra-abdominal and pelvic swelling, mass and lump: Secondary | ICD-10-CM | POA: Diagnosis not present

## 2019-10-06 DIAGNOSIS — K219 Gastro-esophageal reflux disease without esophagitis: Secondary | ICD-10-CM | POA: Diagnosis not present

## 2019-10-06 DIAGNOSIS — I872 Venous insufficiency (chronic) (peripheral): Secondary | ICD-10-CM | POA: Diagnosis not present

## 2019-10-06 DIAGNOSIS — I340 Nonrheumatic mitral (valve) insufficiency: Secondary | ICD-10-CM | POA: Diagnosis not present

## 2019-10-06 DIAGNOSIS — D631 Anemia in chronic kidney disease: Secondary | ICD-10-CM | POA: Diagnosis not present

## 2019-10-06 DIAGNOSIS — I130 Hypertensive heart and chronic kidney disease with heart failure and stage 1 through stage 4 chronic kidney disease, or unspecified chronic kidney disease: Secondary | ICD-10-CM | POA: Diagnosis not present

## 2019-10-06 DIAGNOSIS — I5020 Unspecified systolic (congestive) heart failure: Secondary | ICD-10-CM | POA: Diagnosis not present

## 2019-10-06 DIAGNOSIS — N179 Acute kidney failure, unspecified: Secondary | ICD-10-CM | POA: Diagnosis not present

## 2019-10-06 DIAGNOSIS — E039 Hypothyroidism, unspecified: Secondary | ICD-10-CM | POA: Diagnosis not present

## 2019-10-06 DIAGNOSIS — D61818 Other pancytopenia: Secondary | ICD-10-CM | POA: Diagnosis not present

## 2019-10-06 DIAGNOSIS — K5190 Ulcerative colitis, unspecified, without complications: Secondary | ICD-10-CM | POA: Diagnosis not present

## 2019-10-07 ENCOUNTER — Ambulatory Visit: Payer: Medicare Other | Admitting: Physician Assistant

## 2019-10-07 LAB — BASIC METABOLIC PANEL
BUN/Creatinine Ratio: 35 — ABNORMAL HIGH (ref 12–28)
BUN: 58 mg/dL — ABNORMAL HIGH (ref 8–27)
CO2: 26 mmol/L (ref 20–29)
Calcium: 6.8 mg/dL — CL (ref 8.7–10.3)
Chloride: 102 mmol/L (ref 96–106)
Creatinine, Ser: 1.64 mg/dL — ABNORMAL HIGH (ref 0.57–1.00)
GFR calc Af Amer: 35 mL/min/{1.73_m2} — ABNORMAL LOW (ref >59)
GFR calc non Af Amer: 30 mL/min/{1.73_m2} — ABNORMAL LOW (ref >59)
Glucose: 103 mg/dL — ABNORMAL HIGH (ref 65–99)
Potassium: 5.1 mmol/L (ref 3.5–5.2)
Sodium: 141 mmol/L (ref 134–144)

## 2019-10-08 DIAGNOSIS — R971 Elevated cancer antigen 125 [CA 125]: Secondary | ICD-10-CM | POA: Diagnosis not present

## 2019-10-08 DIAGNOSIS — R52 Pain, unspecified: Secondary | ICD-10-CM | POA: Diagnosis not present

## 2019-10-08 DIAGNOSIS — R970 Elevated carcinoembryonic antigen [CEA]: Secondary | ICD-10-CM | POA: Diagnosis not present

## 2019-10-08 DIAGNOSIS — K219 Gastro-esophageal reflux disease without esophagitis: Secondary | ICD-10-CM | POA: Diagnosis not present

## 2019-10-08 DIAGNOSIS — J449 Chronic obstructive pulmonary disease, unspecified: Secondary | ICD-10-CM | POA: Diagnosis not present

## 2019-10-08 DIAGNOSIS — I872 Venous insufficiency (chronic) (peripheral): Secondary | ICD-10-CM | POA: Diagnosis not present

## 2019-10-08 DIAGNOSIS — I5020 Unspecified systolic (congestive) heart failure: Secondary | ICD-10-CM | POA: Diagnosis not present

## 2019-10-08 DIAGNOSIS — M6281 Muscle weakness (generalized): Secondary | ICD-10-CM | POA: Diagnosis not present

## 2019-10-08 DIAGNOSIS — R5381 Other malaise: Secondary | ICD-10-CM | POA: Diagnosis not present

## 2019-10-11 ENCOUNTER — Telehealth: Payer: Self-pay | Admitting: *Deleted

## 2019-10-11 DIAGNOSIS — R7989 Other specified abnormal findings of blood chemistry: Secondary | ICD-10-CM

## 2019-10-11 DIAGNOSIS — I471 Supraventricular tachycardia: Secondary | ICD-10-CM

## 2019-10-11 DIAGNOSIS — I5033 Acute on chronic diastolic (congestive) heart failure: Secondary | ICD-10-CM

## 2019-10-11 DIAGNOSIS — N1831 Chronic kidney disease, stage 3a: Secondary | ICD-10-CM

## 2019-10-11 DIAGNOSIS — I5032 Chronic diastolic (congestive) heart failure: Secondary | ICD-10-CM

## 2019-10-11 MED ORDER — TORSEMIDE 20 MG PO TABS: 20.0000 mg | ORAL_TABLET | Freq: Every day | ORAL | 1 refills | Status: DC

## 2019-10-11 NOTE — Telephone Encounter (Signed)
-----   Message from [place], [place] sent at 10/07/2019  8:09 AM EDT ----- Renal function back to baseline.   Per Dr. Meda Coffee "She is one of those obesity/hypoventilation syndrome/physical inactivity/venous stasis /lymphedema - in other words unless she looses 50% of her body weigh and starts moving around she will probably not get better. I would continue torsemide 20 mg po daily".   Cancel appointment with me next week. She will follow up with Dr. Meda Coffee in 2-3 months. Encouraged weight loss and low sodium diet. Forward lab copy to PCP. Start Torsemide 2m daily as recommended. IF she is interested, refer to nephrology. She needs BMET in 10 days after stating torsemide.

## 2019-10-11 NOTE — Addendum Note (Signed)
Addended by: Gaetano Net on: 10/11/2019 10:44 AM   Modules accepted: Orders

## 2019-10-11 NOTE — Telephone Encounter (Signed)
Jamie Spark, MD  10/11/2019 10:45 AM EDT    Torsemide 20 mg daily only, not twice daily as previously taken, hold metolazone altogether, when you perform labs on April 30, please perform BMP and BNP. Thank you      Pt aware per Dr. Meda Coffee that she should be taking Torsemide 20 mg po daily, continue holding and stop taking metolazone, and we will check a BMET and PRO-BNP on her, on 10/22/19. Also scheduled the pt an appt with Dr. Meda Coffee for 2-3 months on on 12/15/19 at 2:20 pm.  Advised the pt to arrive 15 mins prior to this appt.  Pt verbalized understanding and agrees with this plan.

## 2019-10-11 NOTE — Telephone Encounter (Signed)
-----   Message from Jeanann Lewandowsky, [place] sent at 10/11/2019 10:39 AM EDT ----- Pt has been made aware to start Torsemide 20 mg daily back.  Will repeat BMET in 10 days, 10/22/2019. Pt aware that we will cancel her f/u appt 4/30 with Vin and to see Dr. Meda Coffee back in 2-3 months.Will fwd to [facility] for an appt with Dr. Meda Coffee Pt verbalized understanding.

## 2019-10-12 DIAGNOSIS — Z9981 Dependence on supplemental oxygen: Secondary | ICD-10-CM | POA: Diagnosis not present

## 2019-10-12 DIAGNOSIS — I340 Nonrheumatic mitral (valve) insufficiency: Secondary | ICD-10-CM | POA: Diagnosis not present

## 2019-10-12 DIAGNOSIS — D631 Anemia in chronic kidney disease: Secondary | ICD-10-CM | POA: Diagnosis not present

## 2019-10-12 DIAGNOSIS — I5032 Chronic diastolic (congestive) heart failure: Secondary | ICD-10-CM | POA: Diagnosis not present

## 2019-10-12 DIAGNOSIS — I5020 Unspecified systolic (congestive) heart failure: Secondary | ICD-10-CM | POA: Diagnosis not present

## 2019-10-12 DIAGNOSIS — J449 Chronic obstructive pulmonary disease, unspecified: Secondary | ICD-10-CM | POA: Diagnosis not present

## 2019-10-12 DIAGNOSIS — K219 Gastro-esophageal reflux disease without esophagitis: Secondary | ICD-10-CM | POA: Diagnosis not present

## 2019-10-12 DIAGNOSIS — E039 Hypothyroidism, unspecified: Secondary | ICD-10-CM | POA: Diagnosis not present

## 2019-10-12 DIAGNOSIS — Z87440 Personal history of urinary (tract) infections: Secondary | ICD-10-CM | POA: Diagnosis not present

## 2019-10-12 DIAGNOSIS — K5190 Ulcerative colitis, unspecified, without complications: Secondary | ICD-10-CM | POA: Diagnosis not present

## 2019-10-12 DIAGNOSIS — R1909 Other intra-abdominal and pelvic swelling, mass and lump: Secondary | ICD-10-CM | POA: Diagnosis not present

## 2019-10-12 DIAGNOSIS — N189 Chronic kidney disease, unspecified: Secondary | ICD-10-CM | POA: Diagnosis not present

## 2019-10-12 DIAGNOSIS — I130 Hypertensive heart and chronic kidney disease with heart failure and stage 1 through stage 4 chronic kidney disease, or unspecified chronic kidney disease: Secondary | ICD-10-CM | POA: Diagnosis not present

## 2019-10-12 DIAGNOSIS — R680 Hypothermia, not associated with low environmental temperature: Secondary | ICD-10-CM | POA: Diagnosis not present

## 2019-10-12 DIAGNOSIS — I471 Supraventricular tachycardia: Secondary | ICD-10-CM | POA: Diagnosis not present

## 2019-10-12 DIAGNOSIS — N179 Acute kidney failure, unspecified: Secondary | ICD-10-CM | POA: Diagnosis not present

## 2019-10-12 DIAGNOSIS — I872 Venous insufficiency (chronic) (peripheral): Secondary | ICD-10-CM | POA: Diagnosis not present

## 2019-10-12 DIAGNOSIS — D61818 Other pancytopenia: Secondary | ICD-10-CM | POA: Diagnosis not present

## 2019-10-14 DIAGNOSIS — I340 Nonrheumatic mitral (valve) insufficiency: Secondary | ICD-10-CM | POA: Diagnosis not present

## 2019-10-14 DIAGNOSIS — R1909 Other intra-abdominal and pelvic swelling, mass and lump: Secondary | ICD-10-CM | POA: Diagnosis not present

## 2019-10-14 DIAGNOSIS — J449 Chronic obstructive pulmonary disease, unspecified: Secondary | ICD-10-CM | POA: Diagnosis not present

## 2019-10-14 DIAGNOSIS — I471 Supraventricular tachycardia: Secondary | ICD-10-CM | POA: Diagnosis not present

## 2019-10-14 DIAGNOSIS — Z87440 Personal history of urinary (tract) infections: Secondary | ICD-10-CM | POA: Diagnosis not present

## 2019-10-14 DIAGNOSIS — N179 Acute kidney failure, unspecified: Secondary | ICD-10-CM | POA: Diagnosis not present

## 2019-10-14 DIAGNOSIS — K5190 Ulcerative colitis, unspecified, without complications: Secondary | ICD-10-CM | POA: Diagnosis not present

## 2019-10-14 DIAGNOSIS — Z9981 Dependence on supplemental oxygen: Secondary | ICD-10-CM | POA: Diagnosis not present

## 2019-10-14 DIAGNOSIS — K219 Gastro-esophageal reflux disease without esophagitis: Secondary | ICD-10-CM | POA: Diagnosis not present

## 2019-10-14 DIAGNOSIS — D631 Anemia in chronic kidney disease: Secondary | ICD-10-CM | POA: Diagnosis not present

## 2019-10-14 DIAGNOSIS — I130 Hypertensive heart and chronic kidney disease with heart failure and stage 1 through stage 4 chronic kidney disease, or unspecified chronic kidney disease: Secondary | ICD-10-CM | POA: Diagnosis not present

## 2019-10-14 DIAGNOSIS — D61818 Other pancytopenia: Secondary | ICD-10-CM | POA: Diagnosis not present

## 2019-10-14 DIAGNOSIS — I5020 Unspecified systolic (congestive) heart failure: Secondary | ICD-10-CM | POA: Diagnosis not present

## 2019-10-14 DIAGNOSIS — R680 Hypothermia, not associated with low environmental temperature: Secondary | ICD-10-CM | POA: Diagnosis not present

## 2019-10-14 DIAGNOSIS — E039 Hypothyroidism, unspecified: Secondary | ICD-10-CM | POA: Diagnosis not present

## 2019-10-14 DIAGNOSIS — N189 Chronic kidney disease, unspecified: Secondary | ICD-10-CM | POA: Diagnosis not present

## 2019-10-14 DIAGNOSIS — I872 Venous insufficiency (chronic) (peripheral): Secondary | ICD-10-CM | POA: Diagnosis not present

## 2019-10-14 DIAGNOSIS — I5032 Chronic diastolic (congestive) heart failure: Secondary | ICD-10-CM | POA: Diagnosis not present

## 2019-10-15 DIAGNOSIS — N189 Chronic kidney disease, unspecified: Secondary | ICD-10-CM | POA: Diagnosis not present

## 2019-10-15 DIAGNOSIS — Z9981 Dependence on supplemental oxygen: Secondary | ICD-10-CM | POA: Diagnosis not present

## 2019-10-15 DIAGNOSIS — I471 Supraventricular tachycardia: Secondary | ICD-10-CM | POA: Diagnosis not present

## 2019-10-15 DIAGNOSIS — E039 Hypothyroidism, unspecified: Secondary | ICD-10-CM | POA: Diagnosis not present

## 2019-10-15 DIAGNOSIS — D631 Anemia in chronic kidney disease: Secondary | ICD-10-CM | POA: Diagnosis not present

## 2019-10-15 DIAGNOSIS — R680 Hypothermia, not associated with low environmental temperature: Secondary | ICD-10-CM | POA: Diagnosis not present

## 2019-10-15 DIAGNOSIS — I5032 Chronic diastolic (congestive) heart failure: Secondary | ICD-10-CM | POA: Diagnosis not present

## 2019-10-15 DIAGNOSIS — J449 Chronic obstructive pulmonary disease, unspecified: Secondary | ICD-10-CM | POA: Diagnosis not present

## 2019-10-15 DIAGNOSIS — I872 Venous insufficiency (chronic) (peripheral): Secondary | ICD-10-CM | POA: Diagnosis not present

## 2019-10-15 DIAGNOSIS — N179 Acute kidney failure, unspecified: Secondary | ICD-10-CM | POA: Diagnosis not present

## 2019-10-15 DIAGNOSIS — I5020 Unspecified systolic (congestive) heart failure: Secondary | ICD-10-CM | POA: Diagnosis not present

## 2019-10-15 DIAGNOSIS — K219 Gastro-esophageal reflux disease without esophagitis: Secondary | ICD-10-CM | POA: Diagnosis not present

## 2019-10-15 DIAGNOSIS — Z87440 Personal history of urinary (tract) infections: Secondary | ICD-10-CM | POA: Diagnosis not present

## 2019-10-15 DIAGNOSIS — I340 Nonrheumatic mitral (valve) insufficiency: Secondary | ICD-10-CM | POA: Diagnosis not present

## 2019-10-15 DIAGNOSIS — I130 Hypertensive heart and chronic kidney disease with heart failure and stage 1 through stage 4 chronic kidney disease, or unspecified chronic kidney disease: Secondary | ICD-10-CM | POA: Diagnosis not present

## 2019-10-15 DIAGNOSIS — K5190 Ulcerative colitis, unspecified, without complications: Secondary | ICD-10-CM | POA: Diagnosis not present

## 2019-10-15 DIAGNOSIS — R1909 Other intra-abdominal and pelvic swelling, mass and lump: Secondary | ICD-10-CM | POA: Diagnosis not present

## 2019-10-15 DIAGNOSIS — D61818 Other pancytopenia: Secondary | ICD-10-CM | POA: Diagnosis not present

## 2019-10-18 DIAGNOSIS — K219 Gastro-esophageal reflux disease without esophagitis: Secondary | ICD-10-CM | POA: Diagnosis not present

## 2019-10-18 DIAGNOSIS — D631 Anemia in chronic kidney disease: Secondary | ICD-10-CM | POA: Diagnosis not present

## 2019-10-18 DIAGNOSIS — J449 Chronic obstructive pulmonary disease, unspecified: Secondary | ICD-10-CM | POA: Diagnosis not present

## 2019-10-18 DIAGNOSIS — I5020 Unspecified systolic (congestive) heart failure: Secondary | ICD-10-CM | POA: Diagnosis not present

## 2019-10-18 DIAGNOSIS — N189 Chronic kidney disease, unspecified: Secondary | ICD-10-CM | POA: Diagnosis not present

## 2019-10-18 DIAGNOSIS — I5032 Chronic diastolic (congestive) heart failure: Secondary | ICD-10-CM | POA: Diagnosis not present

## 2019-10-18 DIAGNOSIS — I340 Nonrheumatic mitral (valve) insufficiency: Secondary | ICD-10-CM | POA: Diagnosis not present

## 2019-10-18 DIAGNOSIS — D61818 Other pancytopenia: Secondary | ICD-10-CM | POA: Diagnosis not present

## 2019-10-18 DIAGNOSIS — E039 Hypothyroidism, unspecified: Secondary | ICD-10-CM | POA: Diagnosis not present

## 2019-10-18 DIAGNOSIS — Z9981 Dependence on supplemental oxygen: Secondary | ICD-10-CM | POA: Diagnosis not present

## 2019-10-18 DIAGNOSIS — N179 Acute kidney failure, unspecified: Secondary | ICD-10-CM | POA: Diagnosis not present

## 2019-10-18 DIAGNOSIS — I130 Hypertensive heart and chronic kidney disease with heart failure and stage 1 through stage 4 chronic kidney disease, or unspecified chronic kidney disease: Secondary | ICD-10-CM | POA: Diagnosis not present

## 2019-10-18 DIAGNOSIS — Z87440 Personal history of urinary (tract) infections: Secondary | ICD-10-CM | POA: Diagnosis not present

## 2019-10-18 DIAGNOSIS — I872 Venous insufficiency (chronic) (peripheral): Secondary | ICD-10-CM | POA: Diagnosis not present

## 2019-10-18 DIAGNOSIS — R1909 Other intra-abdominal and pelvic swelling, mass and lump: Secondary | ICD-10-CM | POA: Diagnosis not present

## 2019-10-18 DIAGNOSIS — I471 Supraventricular tachycardia: Secondary | ICD-10-CM | POA: Diagnosis not present

## 2019-10-18 DIAGNOSIS — R680 Hypothermia, not associated with low environmental temperature: Secondary | ICD-10-CM | POA: Diagnosis not present

## 2019-10-18 DIAGNOSIS — K5190 Ulcerative colitis, unspecified, without complications: Secondary | ICD-10-CM | POA: Diagnosis not present

## 2019-10-20 ENCOUNTER — Ambulatory Visit: Payer: Medicare Other | Admitting: Physician Assistant

## 2019-10-20 DIAGNOSIS — I5020 Unspecified systolic (congestive) heart failure: Secondary | ICD-10-CM | POA: Diagnosis not present

## 2019-10-20 DIAGNOSIS — E039 Hypothyroidism, unspecified: Secondary | ICD-10-CM | POA: Diagnosis not present

## 2019-10-20 DIAGNOSIS — D61818 Other pancytopenia: Secondary | ICD-10-CM | POA: Diagnosis not present

## 2019-10-20 DIAGNOSIS — I872 Venous insufficiency (chronic) (peripheral): Secondary | ICD-10-CM | POA: Diagnosis not present

## 2019-10-20 DIAGNOSIS — I130 Hypertensive heart and chronic kidney disease with heart failure and stage 1 through stage 4 chronic kidney disease, or unspecified chronic kidney disease: Secondary | ICD-10-CM | POA: Diagnosis not present

## 2019-10-20 DIAGNOSIS — I471 Supraventricular tachycardia: Secondary | ICD-10-CM | POA: Diagnosis not present

## 2019-10-20 DIAGNOSIS — K219 Gastro-esophageal reflux disease without esophagitis: Secondary | ICD-10-CM | POA: Diagnosis not present

## 2019-10-20 DIAGNOSIS — I5032 Chronic diastolic (congestive) heart failure: Secondary | ICD-10-CM | POA: Diagnosis not present

## 2019-10-20 DIAGNOSIS — N189 Chronic kidney disease, unspecified: Secondary | ICD-10-CM | POA: Diagnosis not present

## 2019-10-20 DIAGNOSIS — J449 Chronic obstructive pulmonary disease, unspecified: Secondary | ICD-10-CM | POA: Diagnosis not present

## 2019-10-20 DIAGNOSIS — R680 Hypothermia, not associated with low environmental temperature: Secondary | ICD-10-CM | POA: Diagnosis not present

## 2019-10-20 DIAGNOSIS — D631 Anemia in chronic kidney disease: Secondary | ICD-10-CM | POA: Diagnosis not present

## 2019-10-20 DIAGNOSIS — Z87440 Personal history of urinary (tract) infections: Secondary | ICD-10-CM | POA: Diagnosis not present

## 2019-10-20 DIAGNOSIS — R1909 Other intra-abdominal and pelvic swelling, mass and lump: Secondary | ICD-10-CM | POA: Diagnosis not present

## 2019-10-20 DIAGNOSIS — K5190 Ulcerative colitis, unspecified, without complications: Secondary | ICD-10-CM | POA: Diagnosis not present

## 2019-10-20 DIAGNOSIS — Z9981 Dependence on supplemental oxygen: Secondary | ICD-10-CM | POA: Diagnosis not present

## 2019-10-20 DIAGNOSIS — N179 Acute kidney failure, unspecified: Secondary | ICD-10-CM | POA: Diagnosis not present

## 2019-10-20 DIAGNOSIS — I340 Nonrheumatic mitral (valve) insufficiency: Secondary | ICD-10-CM | POA: Diagnosis not present

## 2019-10-21 DIAGNOSIS — R680 Hypothermia, not associated with low environmental temperature: Secondary | ICD-10-CM | POA: Diagnosis not present

## 2019-10-21 DIAGNOSIS — N189 Chronic kidney disease, unspecified: Secondary | ICD-10-CM | POA: Diagnosis not present

## 2019-10-21 DIAGNOSIS — I5032 Chronic diastolic (congestive) heart failure: Secondary | ICD-10-CM | POA: Diagnosis not present

## 2019-10-21 DIAGNOSIS — I872 Venous insufficiency (chronic) (peripheral): Secondary | ICD-10-CM | POA: Diagnosis not present

## 2019-10-21 DIAGNOSIS — I5020 Unspecified systolic (congestive) heart failure: Secondary | ICD-10-CM | POA: Diagnosis not present

## 2019-10-21 DIAGNOSIS — D61818 Other pancytopenia: Secondary | ICD-10-CM | POA: Diagnosis not present

## 2019-10-21 DIAGNOSIS — Z87440 Personal history of urinary (tract) infections: Secondary | ICD-10-CM | POA: Diagnosis not present

## 2019-10-21 DIAGNOSIS — N179 Acute kidney failure, unspecified: Secondary | ICD-10-CM | POA: Diagnosis not present

## 2019-10-21 DIAGNOSIS — E039 Hypothyroidism, unspecified: Secondary | ICD-10-CM | POA: Diagnosis not present

## 2019-10-21 DIAGNOSIS — D631 Anemia in chronic kidney disease: Secondary | ICD-10-CM | POA: Diagnosis not present

## 2019-10-21 DIAGNOSIS — I130 Hypertensive heart and chronic kidney disease with heart failure and stage 1 through stage 4 chronic kidney disease, or unspecified chronic kidney disease: Secondary | ICD-10-CM | POA: Diagnosis not present

## 2019-10-21 DIAGNOSIS — J449 Chronic obstructive pulmonary disease, unspecified: Secondary | ICD-10-CM | POA: Diagnosis not present

## 2019-10-21 DIAGNOSIS — I340 Nonrheumatic mitral (valve) insufficiency: Secondary | ICD-10-CM | POA: Diagnosis not present

## 2019-10-21 DIAGNOSIS — R1909 Other intra-abdominal and pelvic swelling, mass and lump: Secondary | ICD-10-CM | POA: Diagnosis not present

## 2019-10-21 DIAGNOSIS — Z9981 Dependence on supplemental oxygen: Secondary | ICD-10-CM | POA: Diagnosis not present

## 2019-10-21 DIAGNOSIS — K219 Gastro-esophageal reflux disease without esophagitis: Secondary | ICD-10-CM | POA: Diagnosis not present

## 2019-10-21 DIAGNOSIS — I471 Supraventricular tachycardia: Secondary | ICD-10-CM | POA: Diagnosis not present

## 2019-10-21 DIAGNOSIS — K5190 Ulcerative colitis, unspecified, without complications: Secondary | ICD-10-CM | POA: Diagnosis not present

## 2019-10-22 ENCOUNTER — Other Ambulatory Visit: Payer: Self-pay

## 2019-10-22 ENCOUNTER — Other Ambulatory Visit: Payer: Medicare Other | Admitting: *Deleted

## 2019-10-22 DIAGNOSIS — R7989 Other specified abnormal findings of blood chemistry: Secondary | ICD-10-CM

## 2019-10-22 DIAGNOSIS — I5033 Acute on chronic diastolic (congestive) heart failure: Secondary | ICD-10-CM

## 2019-10-22 DIAGNOSIS — I471 Supraventricular tachycardia: Secondary | ICD-10-CM | POA: Diagnosis not present

## 2019-10-22 DIAGNOSIS — I5032 Chronic diastolic (congestive) heart failure: Secondary | ICD-10-CM

## 2019-10-22 DIAGNOSIS — N1831 Chronic kidney disease, stage 3a: Secondary | ICD-10-CM

## 2019-10-23 LAB — PRO B NATRIURETIC PEPTIDE: NT-Pro BNP: 2944 pg/mL — ABNORMAL HIGH (ref 0–738)

## 2019-10-23 LAB — BASIC METABOLIC PANEL
BUN/Creatinine Ratio: 22 (ref 12–28)
BUN: 35 mg/dL — ABNORMAL HIGH (ref 8–27)
CO2: 27 mmol/L (ref 20–29)
Calcium: 6.6 mg/dL — CL (ref 8.7–10.3)
Chloride: 104 mmol/L (ref 96–106)
Creatinine, Ser: 1.57 mg/dL — ABNORMAL HIGH (ref 0.57–1.00)
GFR calc Af Amer: 37 mL/min/{1.73_m2} — ABNORMAL LOW (ref >59)
GFR calc non Af Amer: 32 mL/min/{1.73_m2} — ABNORMAL LOW (ref >59)
Glucose: 89 mg/dL (ref 65–99)
Potassium: 4.6 mmol/L (ref 3.5–5.2)
Sodium: 148 mmol/L — ABNORMAL HIGH (ref 134–144)

## 2019-10-24 DIAGNOSIS — J449 Chronic obstructive pulmonary disease, unspecified: Secondary | ICD-10-CM | POA: Diagnosis not present

## 2019-10-24 DIAGNOSIS — I82499 Acute embolism and thrombosis of other specified deep vein of unspecified lower extremity: Secondary | ICD-10-CM | POA: Diagnosis not present

## 2019-10-24 DIAGNOSIS — I5030 Unspecified diastolic (congestive) heart failure: Secondary | ICD-10-CM | POA: Diagnosis not present

## 2019-10-24 DIAGNOSIS — G4733 Obstructive sleep apnea (adult) (pediatric): Secondary | ICD-10-CM | POA: Diagnosis not present

## 2019-10-25 ENCOUNTER — Telehealth: Payer: Self-pay | Admitting: *Deleted

## 2019-10-25 DIAGNOSIS — I5032 Chronic diastolic (congestive) heart failure: Secondary | ICD-10-CM

## 2019-10-25 DIAGNOSIS — N1831 Chronic kidney disease, stage 3a: Secondary | ICD-10-CM

## 2019-10-25 DIAGNOSIS — I5033 Acute on chronic diastolic (congestive) heart failure: Secondary | ICD-10-CM

## 2019-10-25 DIAGNOSIS — R7989 Other specified abnormal findings of blood chemistry: Secondary | ICD-10-CM

## 2019-10-25 MED ORDER — TORSEMIDE 20 MG PO TABS: 20.0000 mg | ORAL_TABLET | Freq: Two times a day (BID) | ORAL | 0 refills | Status: DC

## 2019-10-25 NOTE — Telephone Encounter (Signed)
Spoke with the pt and informed her that per Dr. Meda Coffee, based on her lab results, we need to increase her Torsemide to 20 mg po bid and repeat BMET/PRO-BNP in 4 weeks.  Confirmed the pharmacy of choice with the pt.  Pt states she has enough on-hand at this time, but we can send this to her pharmacy with a note to "fill later." Scheduled the pt for repeat labs in 4 weeks on 11/23/19 to recheck BMET/PRO-BNP.  Pt verbalized understanding and agrees with this plan.

## 2019-10-25 NOTE — Telephone Encounter (Signed)
-----   Message from Dorothy Spark, MD sent at 10/23/2019  4:14 PM EDT ----- Increase torsemide back to 20 mg po BID, repeat BMP and BNP in 4 weeks.

## 2019-10-27 DIAGNOSIS — E039 Hypothyroidism, unspecified: Secondary | ICD-10-CM | POA: Diagnosis not present

## 2019-10-27 DIAGNOSIS — I872 Venous insufficiency (chronic) (peripheral): Secondary | ICD-10-CM | POA: Diagnosis not present

## 2019-10-27 DIAGNOSIS — I5032 Chronic diastolic (congestive) heart failure: Secondary | ICD-10-CM | POA: Diagnosis not present

## 2019-10-27 DIAGNOSIS — D631 Anemia in chronic kidney disease: Secondary | ICD-10-CM | POA: Diagnosis not present

## 2019-10-27 DIAGNOSIS — N179 Acute kidney failure, unspecified: Secondary | ICD-10-CM | POA: Diagnosis not present

## 2019-10-27 DIAGNOSIS — D61818 Other pancytopenia: Secondary | ICD-10-CM | POA: Diagnosis not present

## 2019-10-27 DIAGNOSIS — J449 Chronic obstructive pulmonary disease, unspecified: Secondary | ICD-10-CM | POA: Diagnosis not present

## 2019-10-27 DIAGNOSIS — N189 Chronic kidney disease, unspecified: Secondary | ICD-10-CM | POA: Diagnosis not present

## 2019-10-27 DIAGNOSIS — R1909 Other intra-abdominal and pelvic swelling, mass and lump: Secondary | ICD-10-CM | POA: Diagnosis not present

## 2019-10-27 DIAGNOSIS — Z87440 Personal history of urinary (tract) infections: Secondary | ICD-10-CM | POA: Diagnosis not present

## 2019-10-27 DIAGNOSIS — K5190 Ulcerative colitis, unspecified, without complications: Secondary | ICD-10-CM | POA: Diagnosis not present

## 2019-10-27 DIAGNOSIS — R680 Hypothermia, not associated with low environmental temperature: Secondary | ICD-10-CM | POA: Diagnosis not present

## 2019-10-27 DIAGNOSIS — I130 Hypertensive heart and chronic kidney disease with heart failure and stage 1 through stage 4 chronic kidney disease, or unspecified chronic kidney disease: Secondary | ICD-10-CM | POA: Diagnosis not present

## 2019-10-27 DIAGNOSIS — I471 Supraventricular tachycardia: Secondary | ICD-10-CM | POA: Diagnosis not present

## 2019-10-27 DIAGNOSIS — I5020 Unspecified systolic (congestive) heart failure: Secondary | ICD-10-CM | POA: Diagnosis not present

## 2019-10-27 DIAGNOSIS — I340 Nonrheumatic mitral (valve) insufficiency: Secondary | ICD-10-CM | POA: Diagnosis not present

## 2019-10-27 DIAGNOSIS — K219 Gastro-esophageal reflux disease without esophagitis: Secondary | ICD-10-CM | POA: Diagnosis not present

## 2019-10-27 DIAGNOSIS — Z9981 Dependence on supplemental oxygen: Secondary | ICD-10-CM | POA: Diagnosis not present

## 2019-10-28 DIAGNOSIS — I872 Venous insufficiency (chronic) (peripheral): Secondary | ICD-10-CM | POA: Diagnosis not present

## 2019-10-28 DIAGNOSIS — I130 Hypertensive heart and chronic kidney disease with heart failure and stage 1 through stage 4 chronic kidney disease, or unspecified chronic kidney disease: Secondary | ICD-10-CM | POA: Diagnosis not present

## 2019-10-28 DIAGNOSIS — Z9981 Dependence on supplemental oxygen: Secondary | ICD-10-CM | POA: Diagnosis not present

## 2019-10-28 DIAGNOSIS — K5190 Ulcerative colitis, unspecified, without complications: Secondary | ICD-10-CM | POA: Diagnosis not present

## 2019-10-28 DIAGNOSIS — J449 Chronic obstructive pulmonary disease, unspecified: Secondary | ICD-10-CM | POA: Diagnosis not present

## 2019-10-28 DIAGNOSIS — I340 Nonrheumatic mitral (valve) insufficiency: Secondary | ICD-10-CM | POA: Diagnosis not present

## 2019-10-28 DIAGNOSIS — K219 Gastro-esophageal reflux disease without esophagitis: Secondary | ICD-10-CM | POA: Diagnosis not present

## 2019-10-28 DIAGNOSIS — E039 Hypothyroidism, unspecified: Secondary | ICD-10-CM | POA: Diagnosis not present

## 2019-10-28 DIAGNOSIS — R1909 Other intra-abdominal and pelvic swelling, mass and lump: Secondary | ICD-10-CM | POA: Diagnosis not present

## 2019-10-28 DIAGNOSIS — R680 Hypothermia, not associated with low environmental temperature: Secondary | ICD-10-CM | POA: Diagnosis not present

## 2019-10-28 DIAGNOSIS — Z87440 Personal history of urinary (tract) infections: Secondary | ICD-10-CM | POA: Diagnosis not present

## 2019-10-28 DIAGNOSIS — N189 Chronic kidney disease, unspecified: Secondary | ICD-10-CM | POA: Diagnosis not present

## 2019-10-28 DIAGNOSIS — I5020 Unspecified systolic (congestive) heart failure: Secondary | ICD-10-CM | POA: Diagnosis not present

## 2019-10-28 DIAGNOSIS — I5032 Chronic diastolic (congestive) heart failure: Secondary | ICD-10-CM | POA: Diagnosis not present

## 2019-10-28 DIAGNOSIS — N179 Acute kidney failure, unspecified: Secondary | ICD-10-CM | POA: Diagnosis not present

## 2019-10-28 DIAGNOSIS — D61818 Other pancytopenia: Secondary | ICD-10-CM | POA: Diagnosis not present

## 2019-10-28 DIAGNOSIS — I471 Supraventricular tachycardia: Secondary | ICD-10-CM | POA: Diagnosis not present

## 2019-10-28 DIAGNOSIS — D631 Anemia in chronic kidney disease: Secondary | ICD-10-CM | POA: Diagnosis not present

## 2019-10-29 DIAGNOSIS — D61818 Other pancytopenia: Secondary | ICD-10-CM | POA: Diagnosis not present

## 2019-10-29 DIAGNOSIS — K219 Gastro-esophageal reflux disease without esophagitis: Secondary | ICD-10-CM | POA: Diagnosis not present

## 2019-10-29 DIAGNOSIS — I471 Supraventricular tachycardia: Secondary | ICD-10-CM | POA: Diagnosis not present

## 2019-10-29 DIAGNOSIS — I5020 Unspecified systolic (congestive) heart failure: Secondary | ICD-10-CM | POA: Diagnosis not present

## 2019-10-29 DIAGNOSIS — E039 Hypothyroidism, unspecified: Secondary | ICD-10-CM | POA: Diagnosis not present

## 2019-10-29 DIAGNOSIS — R680 Hypothermia, not associated with low environmental temperature: Secondary | ICD-10-CM | POA: Diagnosis not present

## 2019-10-29 DIAGNOSIS — R1909 Other intra-abdominal and pelvic swelling, mass and lump: Secondary | ICD-10-CM | POA: Diagnosis not present

## 2019-10-29 DIAGNOSIS — D631 Anemia in chronic kidney disease: Secondary | ICD-10-CM | POA: Diagnosis not present

## 2019-10-29 DIAGNOSIS — N179 Acute kidney failure, unspecified: Secondary | ICD-10-CM | POA: Diagnosis not present

## 2019-10-29 DIAGNOSIS — Z87440 Personal history of urinary (tract) infections: Secondary | ICD-10-CM | POA: Diagnosis not present

## 2019-10-29 DIAGNOSIS — I872 Venous insufficiency (chronic) (peripheral): Secondary | ICD-10-CM | POA: Diagnosis not present

## 2019-10-29 DIAGNOSIS — N189 Chronic kidney disease, unspecified: Secondary | ICD-10-CM | POA: Diagnosis not present

## 2019-10-29 DIAGNOSIS — I5032 Chronic diastolic (congestive) heart failure: Secondary | ICD-10-CM | POA: Diagnosis not present

## 2019-10-29 DIAGNOSIS — I340 Nonrheumatic mitral (valve) insufficiency: Secondary | ICD-10-CM | POA: Diagnosis not present

## 2019-10-29 DIAGNOSIS — Z9981 Dependence on supplemental oxygen: Secondary | ICD-10-CM | POA: Diagnosis not present

## 2019-10-29 DIAGNOSIS — K5190 Ulcerative colitis, unspecified, without complications: Secondary | ICD-10-CM | POA: Diagnosis not present

## 2019-10-29 DIAGNOSIS — I130 Hypertensive heart and chronic kidney disease with heart failure and stage 1 through stage 4 chronic kidney disease, or unspecified chronic kidney disease: Secondary | ICD-10-CM | POA: Diagnosis not present

## 2019-10-29 DIAGNOSIS — J449 Chronic obstructive pulmonary disease, unspecified: Secondary | ICD-10-CM | POA: Diagnosis not present

## 2019-10-31 DIAGNOSIS — I82499 Acute embolism and thrombosis of other specified deep vein of unspecified lower extremity: Secondary | ICD-10-CM | POA: Diagnosis not present

## 2019-10-31 DIAGNOSIS — R269 Unspecified abnormalities of gait and mobility: Secondary | ICD-10-CM | POA: Diagnosis not present

## 2019-10-31 DIAGNOSIS — J449 Chronic obstructive pulmonary disease, unspecified: Secondary | ICD-10-CM | POA: Diagnosis not present

## 2019-10-31 DIAGNOSIS — G4733 Obstructive sleep apnea (adult) (pediatric): Secondary | ICD-10-CM | POA: Diagnosis not present

## 2019-10-31 DIAGNOSIS — I5030 Unspecified diastolic (congestive) heart failure: Secondary | ICD-10-CM | POA: Diagnosis not present

## 2019-11-03 DIAGNOSIS — I340 Nonrheumatic mitral (valve) insufficiency: Secondary | ICD-10-CM | POA: Diagnosis not present

## 2019-11-03 DIAGNOSIS — I130 Hypertensive heart and chronic kidney disease with heart failure and stage 1 through stage 4 chronic kidney disease, or unspecified chronic kidney disease: Secondary | ICD-10-CM | POA: Diagnosis not present

## 2019-11-03 DIAGNOSIS — R680 Hypothermia, not associated with low environmental temperature: Secondary | ICD-10-CM | POA: Diagnosis not present

## 2019-11-03 DIAGNOSIS — K219 Gastro-esophageal reflux disease without esophagitis: Secondary | ICD-10-CM | POA: Diagnosis not present

## 2019-11-03 DIAGNOSIS — K5190 Ulcerative colitis, unspecified, without complications: Secondary | ICD-10-CM | POA: Diagnosis not present

## 2019-11-03 DIAGNOSIS — I5032 Chronic diastolic (congestive) heart failure: Secondary | ICD-10-CM | POA: Diagnosis not present

## 2019-11-03 DIAGNOSIS — I471 Supraventricular tachycardia: Secondary | ICD-10-CM | POA: Diagnosis not present

## 2019-11-03 DIAGNOSIS — J449 Chronic obstructive pulmonary disease, unspecified: Secondary | ICD-10-CM | POA: Diagnosis not present

## 2019-11-03 DIAGNOSIS — I872 Venous insufficiency (chronic) (peripheral): Secondary | ICD-10-CM | POA: Diagnosis not present

## 2019-11-03 DIAGNOSIS — D631 Anemia in chronic kidney disease: Secondary | ICD-10-CM | POA: Diagnosis not present

## 2019-11-03 DIAGNOSIS — N179 Acute kidney failure, unspecified: Secondary | ICD-10-CM | POA: Diagnosis not present

## 2019-11-03 DIAGNOSIS — Z9981 Dependence on supplemental oxygen: Secondary | ICD-10-CM | POA: Diagnosis not present

## 2019-11-03 DIAGNOSIS — R1909 Other intra-abdominal and pelvic swelling, mass and lump: Secondary | ICD-10-CM | POA: Diagnosis not present

## 2019-11-03 DIAGNOSIS — D61818 Other pancytopenia: Secondary | ICD-10-CM | POA: Diagnosis not present

## 2019-11-03 DIAGNOSIS — E039 Hypothyroidism, unspecified: Secondary | ICD-10-CM | POA: Diagnosis not present

## 2019-11-03 DIAGNOSIS — Z87440 Personal history of urinary (tract) infections: Secondary | ICD-10-CM | POA: Diagnosis not present

## 2019-11-03 DIAGNOSIS — N189 Chronic kidney disease, unspecified: Secondary | ICD-10-CM | POA: Diagnosis not present

## 2019-11-03 DIAGNOSIS — I5020 Unspecified systolic (congestive) heart failure: Secondary | ICD-10-CM | POA: Diagnosis not present

## 2019-11-05 ENCOUNTER — Other Ambulatory Visit: Payer: Self-pay

## 2019-11-05 ENCOUNTER — Encounter: Payer: Self-pay | Admitting: Podiatry

## 2019-11-05 ENCOUNTER — Ambulatory Visit: Payer: Medicare Other | Admitting: Podiatry

## 2019-11-05 VITALS — Temp 97.3°F

## 2019-11-05 DIAGNOSIS — M79674 Pain in right toe(s): Secondary | ICD-10-CM | POA: Diagnosis not present

## 2019-11-05 DIAGNOSIS — B351 Tinea unguium: Secondary | ICD-10-CM | POA: Diagnosis not present

## 2019-11-05 DIAGNOSIS — E039 Hypothyroidism, unspecified: Secondary | ICD-10-CM | POA: Diagnosis not present

## 2019-11-05 DIAGNOSIS — M79675 Pain in left toe(s): Secondary | ICD-10-CM

## 2019-11-07 DIAGNOSIS — I5020 Unspecified systolic (congestive) heart failure: Secondary | ICD-10-CM | POA: Diagnosis not present

## 2019-11-07 DIAGNOSIS — R52 Pain, unspecified: Secondary | ICD-10-CM | POA: Diagnosis not present

## 2019-11-07 DIAGNOSIS — J449 Chronic obstructive pulmonary disease, unspecified: Secondary | ICD-10-CM | POA: Diagnosis not present

## 2019-11-07 DIAGNOSIS — M6281 Muscle weakness (generalized): Secondary | ICD-10-CM | POA: Diagnosis not present

## 2019-11-08 NOTE — Progress Notes (Signed)
Subjective:   Patient ID: Jamie Montoya, female   DOB: 74 y.o.   MRN: [id number]   HPI Patient presents stating that she is not been here for a long time and her nails have gotten very thickened elongated and can be tender at times in the corner.  States that she is tried to trim them and soak them and she has trouble wearing shoe gear comfortably and patient does not smoke likes to be active   Review of Systems  All other systems reviewed and are negative.       Objective:  Physical Exam Vitals and nursing note reviewed.  Constitutional:      Appearance: She is well-developed.  Pulmonary:     Effort: Pulmonary effort is normal.  Musculoskeletal:        General: Normal range of motion.  Skin:    General: Skin is warm.  Neurological:     Mental Status: She is alert.     Neurovascular status intact muscle strength was found to be adequate range of motion within normal limits.  Patient is noted to have thickened yellow brittle nailbeds 1-5 both feet that are incurvated in the corners and making shoe gear difficult.  They do become painful especially with shoe gear usage     Assessment:  Chronic mycotic nail infection with pain 1-5 both feet     Plan:  H&P reviewed condition discussed nail disease and trauma and today debrided nailbeds 1-5 both feet with no iatrogenic bleeding noted

## 2019-11-10 DIAGNOSIS — R970 Elevated carcinoembryonic antigen [CEA]: Secondary | ICD-10-CM | POA: Diagnosis not present

## 2019-11-10 DIAGNOSIS — R971 Elevated cancer antigen 125 [CA 125]: Secondary | ICD-10-CM | POA: Diagnosis not present

## 2019-11-10 DIAGNOSIS — J449 Chronic obstructive pulmonary disease, unspecified: Secondary | ICD-10-CM | POA: Diagnosis not present

## 2019-11-10 DIAGNOSIS — R9389 Abnormal findings on diagnostic imaging of other specified body structures: Secondary | ICD-10-CM | POA: Diagnosis not present

## 2019-11-23 ENCOUNTER — Telehealth: Payer: Self-pay | Admitting: Cardiology

## 2019-11-23 ENCOUNTER — Other Ambulatory Visit: Payer: Medicare Other

## 2019-11-23 DIAGNOSIS — R0902 Hypoxemia: Secondary | ICD-10-CM

## 2019-11-23 HISTORY — DX: Hypoxemia: R09.02

## 2019-11-23 NOTE — Telephone Encounter (Signed)
Patient needs to have lab orders mailed to her house so that she can get her labs drawn at her PCP closer to her house. It is draining for the patient to come up to [facility].  The mailing address has been verified

## 2019-11-24 ENCOUNTER — Inpatient Hospital Stay (HOSPITAL_COMMUNITY)
Admission: EM | Admit: 2019-11-24 | Discharge: 2019-12-01 | DRG: 190 | Disposition: A | Discharge status: Home Health | Payer: Medicare Other | Attending: Internal Medicine | Admitting: Internal Medicine

## 2019-11-24 ENCOUNTER — Emergency Department (HOSPITAL_COMMUNITY): Payer: Medicare Other

## 2019-11-24 ENCOUNTER — Other Ambulatory Visit: Payer: Self-pay

## 2019-11-24 ENCOUNTER — Encounter (HOSPITAL_COMMUNITY): Payer: Self-pay | Admitting: Emergency Medicine

## 2019-11-24 DIAGNOSIS — I2729 Other secondary pulmonary hypertension: Secondary | ICD-10-CM | POA: Diagnosis present

## 2019-11-24 DIAGNOSIS — J441 Chronic obstructive pulmonary disease with (acute) exacerbation: Principal | ICD-10-CM | POA: Diagnosis present

## 2019-11-24 DIAGNOSIS — K5190 Ulcerative colitis, unspecified, without complications: Secondary | ICD-10-CM | POA: Diagnosis present

## 2019-11-24 DIAGNOSIS — A0472 Enterocolitis due to Clostridium difficile, not specified as recurrent: Secondary | ICD-10-CM | POA: Diagnosis present

## 2019-11-24 DIAGNOSIS — Z993 Dependence on wheelchair: Secondary | ICD-10-CM

## 2019-11-24 DIAGNOSIS — I872 Venous insufficiency (chronic) (peripheral): Secondary | ICD-10-CM | POA: Diagnosis present

## 2019-11-24 DIAGNOSIS — R970 Elevated carcinoembryonic antigen [CEA]: Secondary | ICD-10-CM | POA: Diagnosis not present

## 2019-11-24 DIAGNOSIS — R1909 Other intra-abdominal and pelvic swelling, mass and lump: Secondary | ICD-10-CM | POA: Diagnosis present

## 2019-11-24 DIAGNOSIS — I5033 Acute on chronic diastolic (congestive) heart failure: Secondary | ICD-10-CM | POA: Diagnosis not present

## 2019-11-24 DIAGNOSIS — R197 Diarrhea, unspecified: Secondary | ICD-10-CM | POA: Diagnosis not present

## 2019-11-24 DIAGNOSIS — J9622 Acute and chronic respiratory failure with hypercapnia: Secondary | ICD-10-CM | POA: Diagnosis present

## 2019-11-24 DIAGNOSIS — N179 Acute kidney failure, unspecified: Secondary | ICD-10-CM | POA: Diagnosis present

## 2019-11-24 DIAGNOSIS — R0602 Shortness of breath: Secondary | ICD-10-CM | POA: Diagnosis not present

## 2019-11-24 DIAGNOSIS — E662 Morbid (severe) obesity with alveolar hypoventilation: Secondary | ICD-10-CM | POA: Diagnosis present

## 2019-11-24 DIAGNOSIS — J9621 Acute and chronic respiratory failure with hypoxia: Secondary | ICD-10-CM | POA: Diagnosis present

## 2019-11-24 DIAGNOSIS — D509 Iron deficiency anemia, unspecified: Secondary | ICD-10-CM | POA: Diagnosis present

## 2019-11-24 DIAGNOSIS — I739 Peripheral vascular disease, unspecified: Secondary | ICD-10-CM | POA: Diagnosis present

## 2019-11-24 DIAGNOSIS — Z87891 Personal history of nicotine dependence: Secondary | ICD-10-CM

## 2019-11-24 DIAGNOSIS — Z8249 Family history of ischemic heart disease and other diseases of the circulatory system: Secondary | ICD-10-CM

## 2019-11-24 DIAGNOSIS — G4733 Obstructive sleep apnea (adult) (pediatric): Secondary | ICD-10-CM | POA: Diagnosis not present

## 2019-11-24 DIAGNOSIS — I10 Essential (primary) hypertension: Secondary | ICD-10-CM | POA: Diagnosis not present

## 2019-11-24 DIAGNOSIS — E039 Hypothyroidism, unspecified: Secondary | ICD-10-CM | POA: Diagnosis not present

## 2019-11-24 DIAGNOSIS — R680 Hypothermia, not associated with low environmental temperature: Secondary | ICD-10-CM | POA: Diagnosis not present

## 2019-11-24 DIAGNOSIS — I130 Hypertensive heart and chronic kidney disease with heart failure and stage 1 through stage 4 chronic kidney disease, or unspecified chronic kidney disease: Secondary | ICD-10-CM | POA: Diagnosis not present

## 2019-11-24 DIAGNOSIS — N9489 Other specified conditions associated with female genital organs and menstrual cycle: Secondary | ICD-10-CM

## 2019-11-24 DIAGNOSIS — Z79890 Hormone replacement therapy (postmenopausal): Secondary | ICD-10-CM

## 2019-11-24 DIAGNOSIS — Z79899 Other long term (current) drug therapy: Secondary | ICD-10-CM

## 2019-11-24 DIAGNOSIS — Z20822 Contact with and (suspected) exposure to covid-19: Secondary | ICD-10-CM | POA: Diagnosis not present

## 2019-11-24 DIAGNOSIS — R971 Elevated cancer antigen 125 [CA 125]: Secondary | ICD-10-CM | POA: Diagnosis not present

## 2019-11-24 DIAGNOSIS — I82499 Acute embolism and thrombosis of other specified deep vein of unspecified lower extremity: Secondary | ICD-10-CM | POA: Diagnosis not present

## 2019-11-24 DIAGNOSIS — R0902 Hypoxemia: Secondary | ICD-10-CM | POA: Diagnosis not present

## 2019-11-24 DIAGNOSIS — E785 Hyperlipidemia, unspecified: Secondary | ICD-10-CM | POA: Diagnosis present

## 2019-11-24 DIAGNOSIS — Z800 Family history of malignant neoplasm of digestive organs: Secondary | ICD-10-CM

## 2019-11-24 DIAGNOSIS — J9601 Acute respiratory failure with hypoxia: Secondary | ICD-10-CM | POA: Diagnosis not present

## 2019-11-24 DIAGNOSIS — E8881 Metabolic syndrome: Secondary | ICD-10-CM | POA: Diagnosis present

## 2019-11-24 DIAGNOSIS — N1831 Chronic kidney disease, stage 3a: Secondary | ICD-10-CM | POA: Diagnosis not present

## 2019-11-24 DIAGNOSIS — D696 Thrombocytopenia, unspecified: Secondary | ICD-10-CM | POA: Diagnosis not present

## 2019-11-24 DIAGNOSIS — D631 Anemia in chronic kidney disease: Secondary | ICD-10-CM | POA: Diagnosis present

## 2019-11-24 DIAGNOSIS — I480 Paroxysmal atrial fibrillation: Secondary | ICD-10-CM | POA: Diagnosis not present

## 2019-11-24 DIAGNOSIS — J449 Chronic obstructive pulmonary disease, unspecified: Secondary | ICD-10-CM | POA: Diagnosis not present

## 2019-11-24 DIAGNOSIS — E870 Hyperosmolality and hypernatremia: Secondary | ICD-10-CM | POA: Diagnosis not present

## 2019-11-24 DIAGNOSIS — N808 Other endometriosis: Secondary | ICD-10-CM | POA: Diagnosis present

## 2019-11-24 DIAGNOSIS — Z9981 Dependence on supplemental oxygen: Secondary | ICD-10-CM | POA: Diagnosis not present

## 2019-11-24 DIAGNOSIS — K219 Gastro-esophageal reflux disease without esophagitis: Secondary | ICD-10-CM | POA: Diagnosis present

## 2019-11-24 DIAGNOSIS — K5130 Ulcerative (chronic) rectosigmoiditis without complications: Secondary | ICD-10-CM | POA: Diagnosis not present

## 2019-11-24 DIAGNOSIS — I5030 Unspecified diastolic (congestive) heart failure: Secondary | ICD-10-CM | POA: Diagnosis not present

## 2019-11-24 DIAGNOSIS — Z6841 Body Mass Index (BMI) 40.0 and over, adult: Secondary | ICD-10-CM

## 2019-11-24 DIAGNOSIS — D500 Iron deficiency anemia secondary to blood loss (chronic): Secondary | ICD-10-CM | POA: Diagnosis not present

## 2019-11-24 DIAGNOSIS — I959 Hypotension, unspecified: Secondary | ICD-10-CM | POA: Diagnosis not present

## 2019-11-24 LAB — BASIC METABOLIC PANEL
Anion gap: 11 (ref 5–15)
BUN: 44 mg/dL — ABNORMAL HIGH (ref 8–23)
CO2: 36 mmol/L — ABNORMAL HIGH (ref 22–32)
Calcium: 7.6 mg/dL — ABNORMAL LOW (ref 8.9–10.3)
Chloride: 98 mmol/L (ref 98–111)
Creatinine, Ser: 1.77 mg/dL — ABNORMAL HIGH (ref 0.44–1.00)
GFR calc Af Amer: 32 mL/min — ABNORMAL LOW (ref >60)
GFR calc non Af Amer: 28 mL/min — ABNORMAL LOW (ref >60)
Glucose, Bld: 82 mg/dL (ref 70–99)
Potassium: 4.2 mmol/L (ref 3.5–5.1)
Sodium: 145 mmol/L (ref 135–145)

## 2019-11-24 LAB — CBC
HCT: 26.2 % — ABNORMAL LOW (ref 36.0–46.0)
Hemoglobin: 7.1 g/dL — ABNORMAL LOW (ref 12.0–15.0)
MCH: 30.1 pg (ref 26.0–34.0)
MCHC: 27.1 g/dL — ABNORMAL LOW (ref 30.0–36.0)
MCV: 111 fL — ABNORMAL HIGH (ref 80.0–100.0)
Platelets: 129 10*3/uL — ABNORMAL LOW (ref 150–400)
RBC: 2.36 MIL/uL — ABNORMAL LOW (ref 3.87–5.11)
RDW: 16.2 % — ABNORMAL HIGH (ref 11.5–15.5)
WBC: 4.1 10*3/uL (ref 4.0–10.5)
nRBC: 0 % (ref 0.0–0.2)

## 2019-11-24 LAB — BRAIN NATRIURETIC PEPTIDE: B Natriuretic Peptide: 589.6 pg/mL — ABNORMAL HIGH (ref 0.0–100.0)

## 2019-11-24 LAB — TROPONIN I (HIGH SENSITIVITY)
Troponin I (High Sensitivity): 17 ng/L (ref <18)
Troponin I (High Sensitivity): 14 ng/L (ref <18)

## 2019-11-24 IMAGING — CR DG CHEST 2V
2 series · 2 of 2 positions shown · non-contrast
Comparison: [DATE]

CLINICAL DATA: Shortness of breath

EXAM:
CHEST - 2 VIEW

[chest lat]
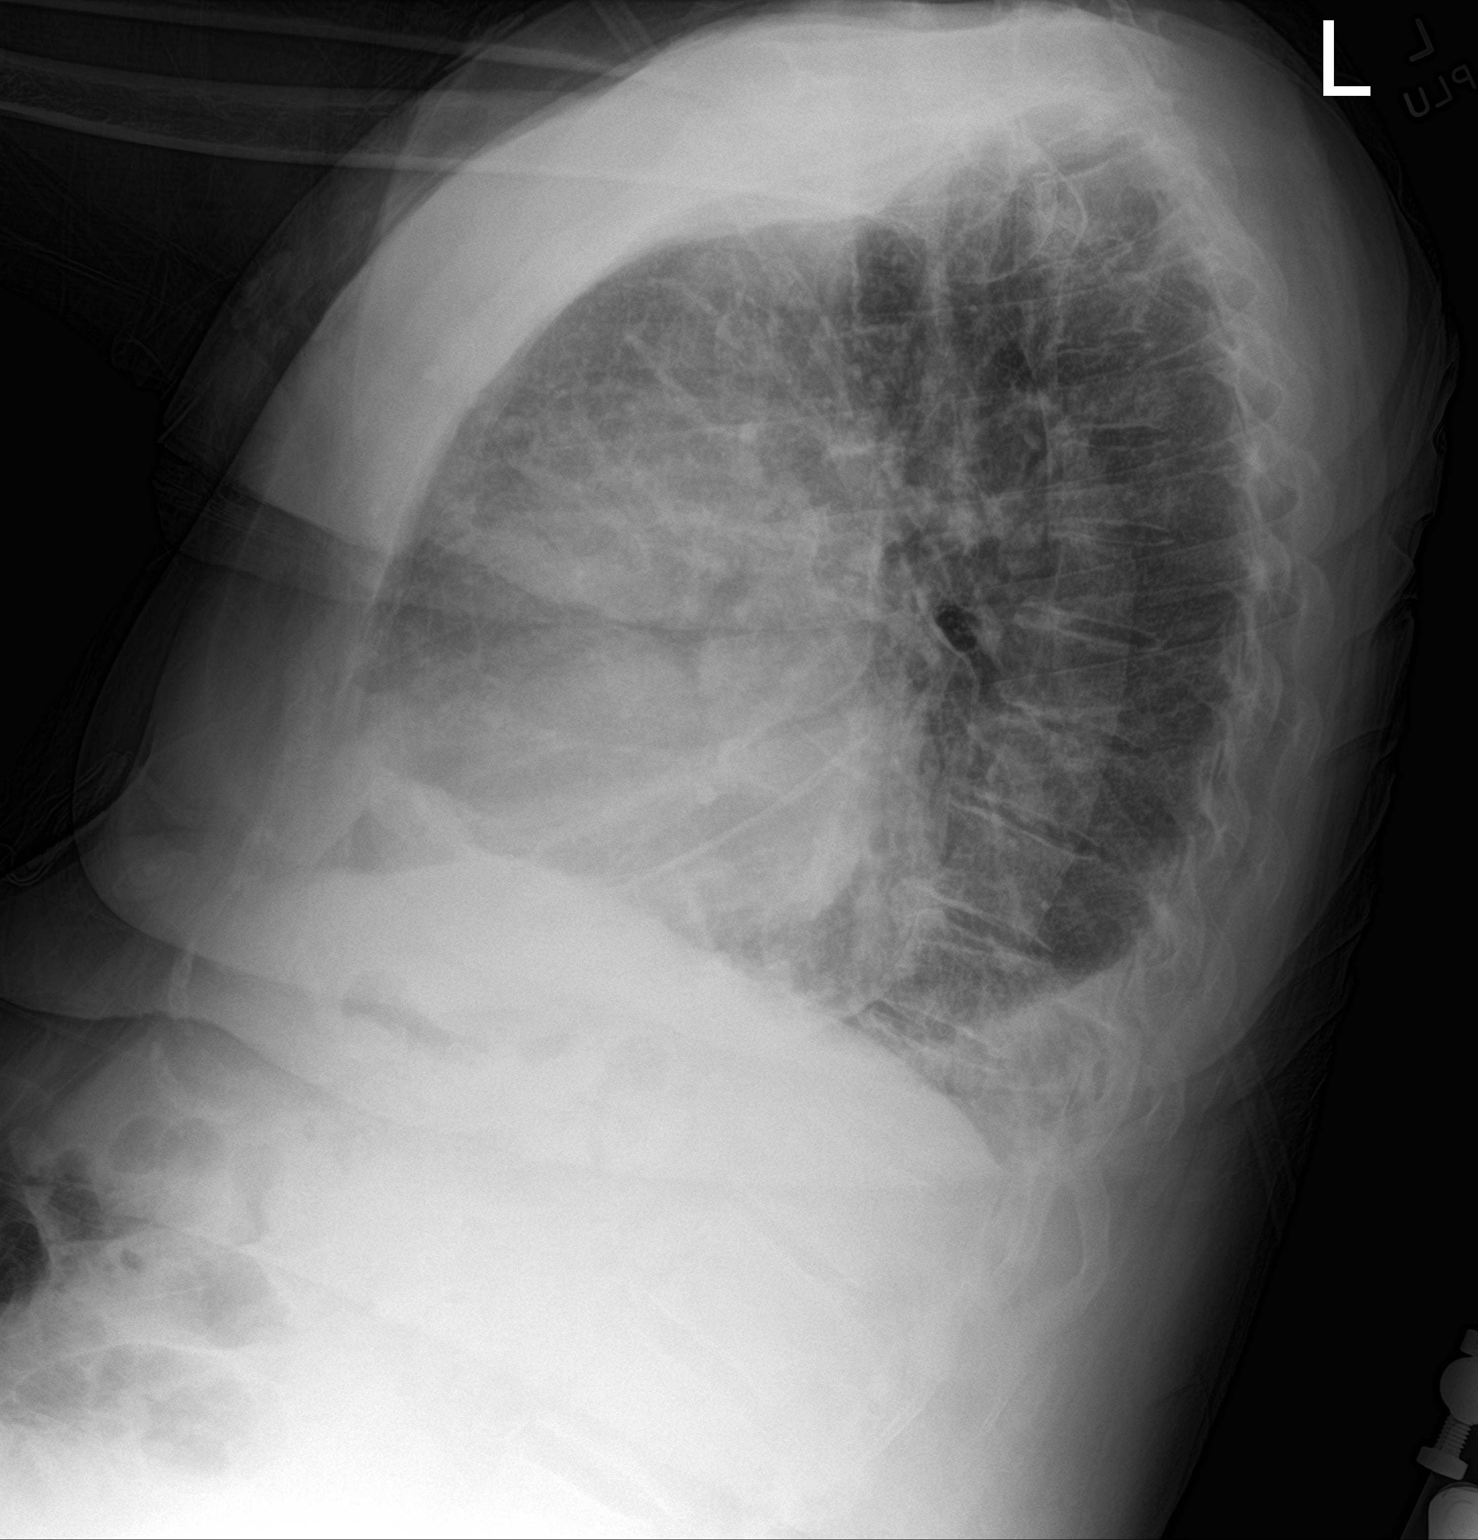

[chest ap]
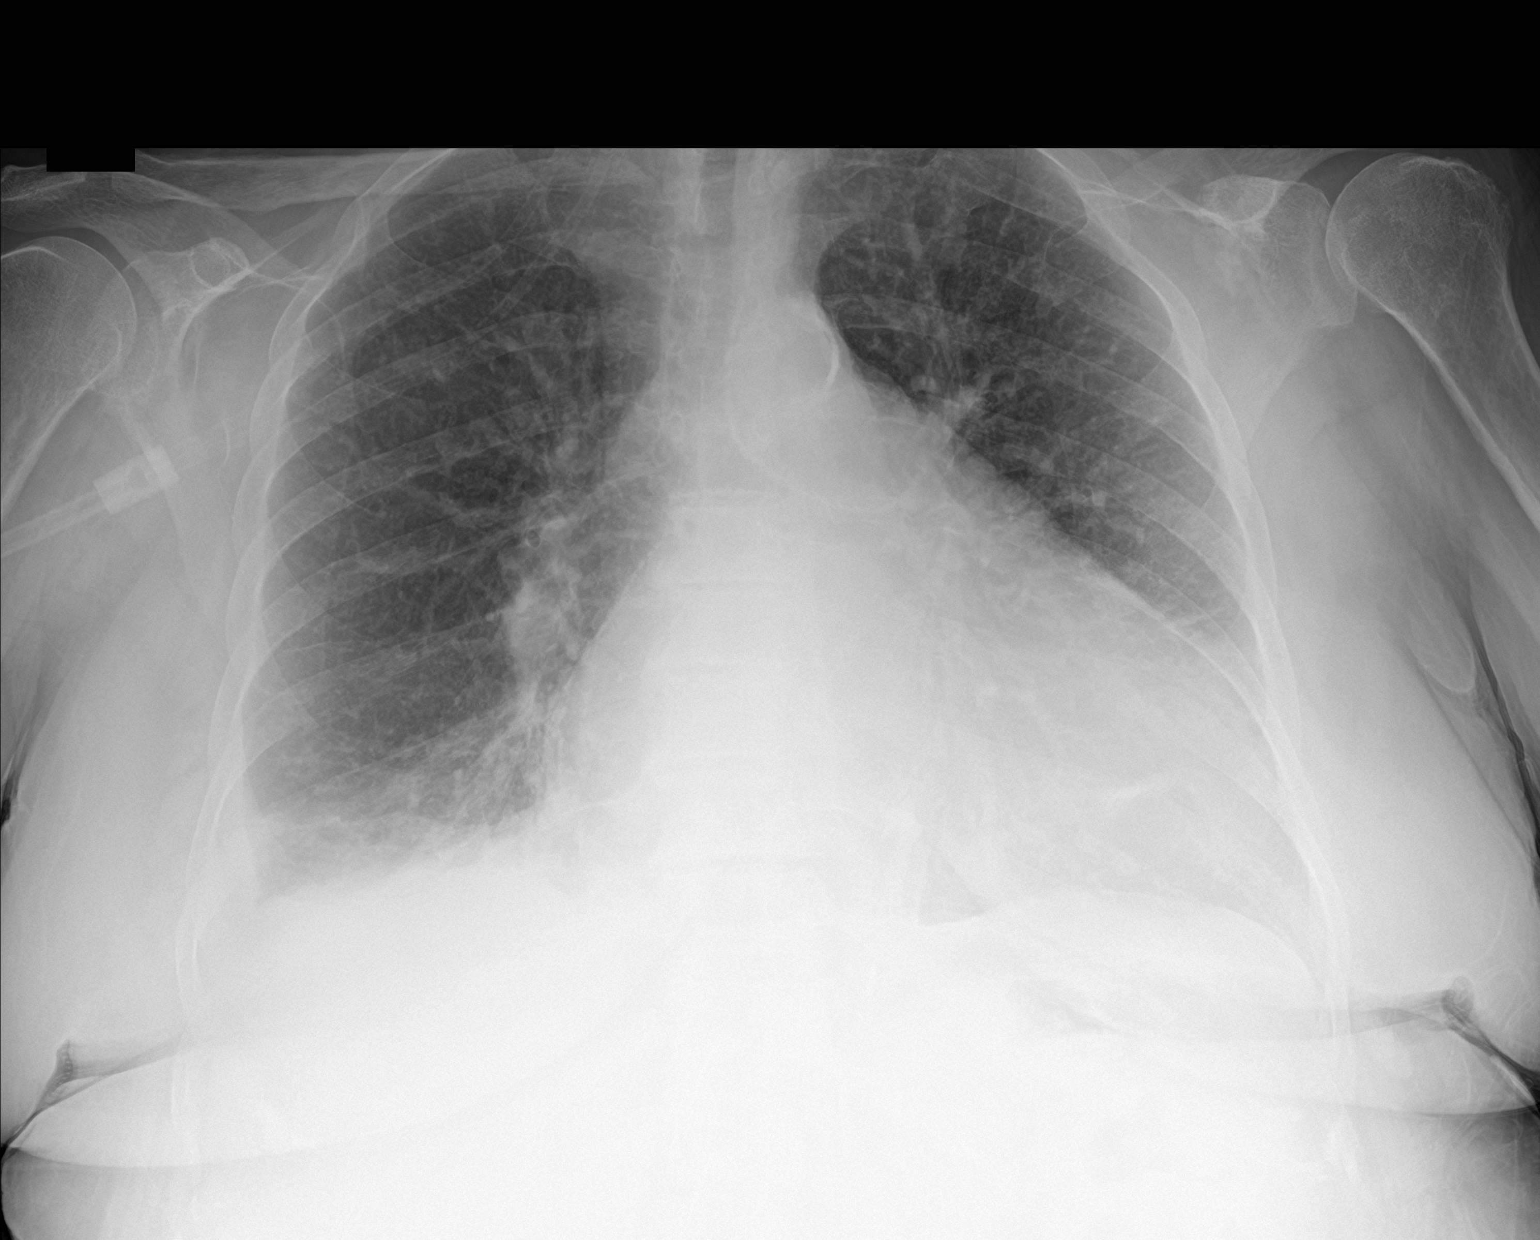

[2 of 2 positions shown; findings below may reference images not displayed]

FINDINGS: Cardiomegaly. Small right pleural effusion. Bibasilar atelectasis or
infiltrates, right greater than left. No overt edema. No acute bony
abnormality.
IMPRESSION: Small right pleural effusion.  Bibasilar atelectasis or infiltrates.

Cardiomegaly.

## 2019-11-24 MED ORDER — SODIUM CHLORIDE 0.9% FLUSH: 3.0000 mL | Freq: Once | INTRAVENOUS | Status: DC

## 2019-11-24 MED ORDER — IPRATROPIUM-ALBUTEROL 0.5-2.5 (3) MG/3ML IN SOLN
3.0000 mL | Freq: Once | RESPIRATORY_TRACT | Status: AC
  Administered 2019-11-24: 3 mL via RESPIRATORY_TRACT
  Filled 2019-11-24: qty 3

## 2019-11-24 MED ORDER — SODIUM CHLORIDE 0.9 % IV SOLN
500.0000 mg | Freq: Once | INTRAVENOUS | Status: AC
  Administered 2019-11-24: 500 mg via INTRAVENOUS
  Filled 2019-11-24: qty 500
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
## 2019-11-24 NOTE — Telephone Encounter (Signed)
I am really sorry to hear that she is not feeling well t she really needs to go to ER

## 2019-11-24 NOTE — Telephone Encounter (Signed)
Spoke with the pt and caregiver [name], and informed them both that per Dr. Meda Coffee, she is sorry to her she is feeling bad, but she highly encourages the pt to go to the ER, for further evaluation.  Per the Caregiver [name], she agrees with this plan, and pt has now agreed to go to the ER for further evaluation.  [name] was gracious for all the assistance provided.  Will route this message to Dr. Meda Coffee and Card Master Birdie Sons, to make them aware that the pt is on the way to the ER via private vehicle, driven by caregiver.

## 2019-11-24 NOTE — Telephone Encounter (Signed)
Called the pt from call placed yesterday, where she could not attend her lab appt to check BMET/PRO-BNP, for recent med changes made 4 weeks ago from Dr. Meda Coffee.  Caregiver answered the phone and stated the pt was unable to talk at this time, for she is too fatigued and weak from not feeling well, which is why she called to have her labs cancelled from yesterday and sent to a more convenient lab closer to her home in [place].  Per the Caregiver [name], she was sitting right beside the pt and placed her on speaker.  I was inquiring what symptoms the pt is experiencing.  Per [name], the pt is having weight gain, now weighing at 262 lbs, and last current weight in Epic was on 5/19, where she went to her OBGYN at [facility] and was 259 lbs.  On 4/7 at her OV with [facility], the pt was 234 lbs.  Per [name], the pt is not voiding appropriately, she has increased swelling to her lower extremities, and she has now developed a productive cough, which is clear in phlegm.  Per [name], the pt is not eating at all, and is very fatigued.  [name] is concerned she may have an acute URI, or her heart failure is worsening. She is also concerned with her Kidneys.  [name] states "she is not thriving." Pt does have Chronic Diastolic CHF, and we have made several changes to her diuretic, based on renal function and BNP level, which is why she was to have lab done yesterday 6/1 at our office.  Per [name], pt has increased sob and DOE. Pt is not complaining of chest pain at this time.  [name] states she has generalized weakness.   Informed [name], given symptoms she is reporting to me, she needs to call EMS for the pt,  and have her transported to [facility] ER.  [name] agrees with this plan, but pt was very argumentative in the background, and refusing to go to the ER.  Pt states she just wants to go to a Doctors office, not a hospital for complaints.  Reiterated to the pt and to [name] that the pt needs to go to the  ER, to have further assessment and treatment, based on her acute needs and priority of symptoms.  Pt still refuses recommendations and going AMA.  While [name] was talking to me on the phone, the pt instructed her to hang the phone up on me, for she stated "I am not going to the hospital." [place] apologized and then hung the phone up.  Will send this message to Dr. Meda Coffee for further review and advisement, and try to follow-up with the pt accordingly thereafter.

## 2019-11-24 NOTE — ED Triage Notes (Signed)
Pt arrives  to ED with C/O of sob and weight gain, hx chf. Pt on 2L [place] at home normally but state she turned it up to 3L today due to sob.

## 2019-11-25 ENCOUNTER — Encounter (HOSPITAL_COMMUNITY): Payer: Self-pay | Admitting: Family Medicine

## 2019-11-25 ENCOUNTER — Other Ambulatory Visit: Payer: Self-pay

## 2019-11-25 DIAGNOSIS — R970 Elevated carcinoembryonic antigen [CEA]: Secondary | ICD-10-CM

## 2019-11-25 DIAGNOSIS — N9489 Other specified conditions associated with female genital organs and menstrual cycle: Secondary | ICD-10-CM

## 2019-11-25 DIAGNOSIS — E870 Hyperosmolality and hypernatremia: Secondary | ICD-10-CM | POA: Diagnosis not present

## 2019-11-25 DIAGNOSIS — R680 Hypothermia, not associated with low environmental temperature: Secondary | ICD-10-CM | POA: Diagnosis not present

## 2019-11-25 DIAGNOSIS — J9622 Acute and chronic respiratory failure with hypercapnia: Secondary | ICD-10-CM | POA: Diagnosis present

## 2019-11-25 DIAGNOSIS — D509 Iron deficiency anemia, unspecified: Secondary | ICD-10-CM | POA: Diagnosis present

## 2019-11-25 DIAGNOSIS — Z993 Dependence on wheelchair: Secondary | ICD-10-CM | POA: Diagnosis not present

## 2019-11-25 DIAGNOSIS — J449 Chronic obstructive pulmonary disease, unspecified: Secondary | ICD-10-CM | POA: Diagnosis not present

## 2019-11-25 DIAGNOSIS — R0902 Hypoxemia: Secondary | ICD-10-CM | POA: Diagnosis not present

## 2019-11-25 DIAGNOSIS — J441 Chronic obstructive pulmonary disease with (acute) exacerbation: Secondary | ICD-10-CM

## 2019-11-25 DIAGNOSIS — A0472 Enterocolitis due to Clostridium difficile, not specified as recurrent: Secondary | ICD-10-CM | POA: Diagnosis present

## 2019-11-25 DIAGNOSIS — I82499 Acute embolism and thrombosis of other specified deep vein of unspecified lower extremity: Secondary | ICD-10-CM | POA: Diagnosis not present

## 2019-11-25 DIAGNOSIS — N1831 Chronic kidney disease, stage 3a: Secondary | ICD-10-CM | POA: Diagnosis present

## 2019-11-25 DIAGNOSIS — J9601 Acute respiratory failure with hypoxia: Secondary | ICD-10-CM | POA: Diagnosis not present

## 2019-11-25 DIAGNOSIS — Z9981 Dependence on supplemental oxygen: Secondary | ICD-10-CM | POA: Diagnosis not present

## 2019-11-25 DIAGNOSIS — N179 Acute kidney failure, unspecified: Secondary | ICD-10-CM | POA: Diagnosis not present

## 2019-11-25 DIAGNOSIS — R197 Diarrhea, unspecified: Secondary | ICD-10-CM | POA: Diagnosis not present

## 2019-11-25 DIAGNOSIS — I480 Paroxysmal atrial fibrillation: Secondary | ICD-10-CM | POA: Diagnosis not present

## 2019-11-25 DIAGNOSIS — I10 Essential (primary) hypertension: Secondary | ICD-10-CM | POA: Diagnosis not present

## 2019-11-25 DIAGNOSIS — K5190 Ulcerative colitis, unspecified, without complications: Secondary | ICD-10-CM | POA: Diagnosis present

## 2019-11-25 DIAGNOSIS — K5130 Ulcerative (chronic) rectosigmoiditis without complications: Secondary | ICD-10-CM | POA: Diagnosis not present

## 2019-11-25 DIAGNOSIS — E039 Hypothyroidism, unspecified: Secondary | ICD-10-CM

## 2019-11-25 DIAGNOSIS — D649 Anemia, unspecified: Secondary | ICD-10-CM

## 2019-11-25 DIAGNOSIS — G4733 Obstructive sleep apnea (adult) (pediatric): Secondary | ICD-10-CM | POA: Diagnosis not present

## 2019-11-25 DIAGNOSIS — I739 Peripheral vascular disease, unspecified: Secondary | ICD-10-CM | POA: Diagnosis present

## 2019-11-25 DIAGNOSIS — I130 Hypertensive heart and chronic kidney disease with heart failure and stage 1 through stage 4 chronic kidney disease, or unspecified chronic kidney disease: Secondary | ICD-10-CM | POA: Diagnosis present

## 2019-11-25 DIAGNOSIS — K529 Noninfective gastroenteritis and colitis, unspecified: Secondary | ICD-10-CM

## 2019-11-25 DIAGNOSIS — E662 Morbid (severe) obesity with alveolar hypoventilation: Secondary | ICD-10-CM | POA: Diagnosis present

## 2019-11-25 DIAGNOSIS — R269 Unspecified abnormalities of gait and mobility: Secondary | ICD-10-CM | POA: Diagnosis not present

## 2019-11-25 DIAGNOSIS — R5381 Other malaise: Secondary | ICD-10-CM

## 2019-11-25 DIAGNOSIS — I5030 Unspecified diastolic (congestive) heart failure: Secondary | ICD-10-CM | POA: Diagnosis not present

## 2019-11-25 DIAGNOSIS — Z6841 Body Mass Index (BMI) 40.0 and over, adult: Secondary | ICD-10-CM | POA: Diagnosis not present

## 2019-11-25 DIAGNOSIS — D696 Thrombocytopenia, unspecified: Secondary | ICD-10-CM

## 2019-11-25 DIAGNOSIS — R9389 Abnormal findings on diagnostic imaging of other specified body structures: Secondary | ICD-10-CM

## 2019-11-25 DIAGNOSIS — D500 Iron deficiency anemia secondary to blood loss (chronic): Secondary | ICD-10-CM | POA: Diagnosis not present

## 2019-11-25 DIAGNOSIS — I5033 Acute on chronic diastolic (congestive) heart failure: Secondary | ICD-10-CM | POA: Diagnosis not present

## 2019-11-25 DIAGNOSIS — Z20822 Contact with and (suspected) exposure to covid-19: Secondary | ICD-10-CM | POA: Diagnosis present

## 2019-11-25 DIAGNOSIS — I872 Venous insufficiency (chronic) (peripheral): Secondary | ICD-10-CM | POA: Diagnosis present

## 2019-11-25 DIAGNOSIS — R971 Elevated cancer antigen 125 [CA 125]: Secondary | ICD-10-CM

## 2019-11-25 DIAGNOSIS — E8881 Metabolic syndrome: Secondary | ICD-10-CM | POA: Diagnosis present

## 2019-11-25 DIAGNOSIS — J9621 Acute and chronic respiratory failure with hypoxia: Secondary | ICD-10-CM | POA: Diagnosis present

## 2019-11-25 LAB — CBC
HCT: 24.5 % — ABNORMAL LOW (ref 36.0–46.0)
Hemoglobin: 6.7 g/dL — CL (ref 12.0–15.0)
MCH: 30 pg (ref 26.0–34.0)
MCHC: 27.3 g/dL — ABNORMAL LOW (ref 30.0–36.0)
MCV: 109.9 fL — ABNORMAL HIGH (ref 80.0–100.0)
Platelets: 124 10*3/uL — ABNORMAL LOW (ref 150–400)
RBC: 2.23 MIL/uL — ABNORMAL LOW (ref 3.87–5.11)
RDW: 16 % — ABNORMAL HIGH (ref 11.5–15.5)
WBC: 4 10*3/uL (ref 4.0–10.5)
nRBC: 0 % (ref 0.0–0.2)

## 2019-11-25 LAB — RETICULOCYTES
Immature Retic Fract: 21.8 % — ABNORMAL HIGH (ref 2.3–15.9)
RBC.: 2.2 MIL/uL — ABNORMAL LOW (ref 3.87–5.11)
Retic Count, Absolute: 91.5 10*3/uL (ref 19.0–186.0)
Retic Ct Pct: 4.2 % — ABNORMAL HIGH (ref 0.4–3.1)

## 2019-11-25 LAB — FOLATE: Folate: 12.9 ng/mL (ref >5.9)

## 2019-11-25 LAB — BLOOD GAS, ARTERIAL
Acid-Base Excess: 8.7 mmol/L — ABNORMAL HIGH (ref 0.0–2.0)
Bicarbonate: 35.2 mmol/L — ABNORMAL HIGH (ref 20.0–28.0)
Drawn by: 511471
FIO2: 44
O2 Saturation: 88.6 %
Patient temperature: 36.3
pCO2 arterial: 73 mmHg (ref 32.0–48.0)
pH, Arterial: 7.3 — ABNORMAL LOW (ref 7.350–7.450)
pO2, Arterial: 59.8 mmHg — ABNORMAL LOW (ref 83.0–108.0)

## 2019-11-25 LAB — IRON AND TIBC
Iron: 22 ug/dL — ABNORMAL LOW (ref 28–170)
Saturation Ratios: 8 % — ABNORMAL LOW (ref 10.4–31.8)
TIBC: 284 ug/dL (ref 250–450)
UIBC: 262 ug/dL

## 2019-11-25 LAB — GLUCOSE, CAPILLARY: Glucose-Capillary: 84 mg/dL (ref 70–99)

## 2019-11-25 LAB — MAGNESIUM: Magnesium: 1.8 mg/dL (ref 1.7–2.4)

## 2019-11-25 LAB — RENAL FUNCTION PANEL
Albumin: 2.8 g/dL — ABNORMAL LOW (ref 3.5–5.0)
Anion gap: 9 (ref 5–15)
BUN: 46 mg/dL — ABNORMAL HIGH (ref 8–23)
CO2: 37 mmol/L — ABNORMAL HIGH (ref 22–32)
Calcium: 7.3 mg/dL — ABNORMAL LOW (ref 8.9–10.3)
Chloride: 99 mmol/L (ref 98–111)
Creatinine, Ser: 2 mg/dL — ABNORMAL HIGH (ref 0.44–1.00)
GFR calc Af Amer: 28 mL/min — ABNORMAL LOW (ref >60)
GFR calc non Af Amer: 24 mL/min — ABNORMAL LOW (ref >60)
Glucose, Bld: 85 mg/dL (ref 70–99)
Phosphorus: 4.2 mg/dL (ref 2.5–4.6)
Potassium: 4.1 mmol/L (ref 3.5–5.1)
Sodium: 145 mmol/L (ref 135–145)

## 2019-11-25 LAB — HEMOGLOBIN AND HEMATOCRIT, BLOOD
HCT: 28.4 % — ABNORMAL LOW (ref 36.0–46.0)
Hemoglobin: 8.1 g/dL — ABNORMAL LOW (ref 12.0–15.0)

## 2019-11-25 LAB — HIV ANTIBODY (ROUTINE TESTING W REFLEX): HIV Screen 4th Generation wRfx: NONREACTIVE

## 2019-11-25 LAB — TSH: TSH: 1.081 u[IU]/mL (ref 0.350–4.500)

## 2019-11-25 LAB — PREPARE RBC (CROSSMATCH)

## 2019-11-25 LAB — VITAMIN B12: Vitamin B-12: 727 pg/mL (ref 180–914)

## 2019-11-25 LAB — ABO/RH: ABO/RH(D): AB POS

## 2019-11-25 LAB — FERRITIN: Ferritin: 93 ng/mL (ref 11–307)

## 2019-11-25 LAB — SARS CORONAVIRUS 2 BY RT PCR (HOSPITAL ORDER, PERFORMED IN ~~LOC~~ HOSPITAL LAB): SARS Coronavirus 2: NEGATIVE

## 2019-11-25 MED ORDER — ENOXAPARIN SODIUM 40 MG/0.4ML ~~LOC~~ SOLN
40.0000 mg | SUBCUTANEOUS | Status: DC
  Filled 2019-11-25: qty 0.4

## 2019-11-25 MED ORDER — ARFORMOTEROL TARTRATE 15 MCG/2ML IN NEBU
15.0000 ug | INHALATION_SOLUTION | Freq: Two times a day (BID) | RESPIRATORY_TRACT | Status: DC
  Administered 2019-11-25 – 2019-12-01 (×11): 15 ug via RESPIRATORY_TRACT
  Filled 2019-11-25 (×11): qty 2

## 2019-11-25 MED ORDER — UMECLIDINIUM BROMIDE 62.5 MCG/INH IN AEPB
1.0000 | INHALATION_SPRAY | Freq: Every day | RESPIRATORY_TRACT | Status: DC
  Administered 2019-11-26 – 2019-12-01 (×5): 1 via RESPIRATORY_TRACT
  Filled 2019-11-25: qty 7

## 2019-11-25 MED ORDER — FERROUS SULFATE 325 (65 FE) MG PO TABS
325.0000 mg | ORAL_TABLET | Freq: Every day | ORAL | Status: DC
  Administered 2019-11-25 – 2019-12-01 (×7): 325 mg via ORAL
  Filled 2019-11-25 (×7): qty 1

## 2019-11-25 MED ORDER — SODIUM CHLORIDE 0.9% IV SOLUTION: Freq: Once | INTRAVENOUS | Status: AC

## 2019-11-25 MED ORDER — IPRATROPIUM-ALBUTEROL 0.5-2.5 (3) MG/3ML IN SOLN: 3.0000 mL | Freq: Four times a day (QID) | RESPIRATORY_TRACT | Status: DC | PRN

## 2019-11-25 MED ORDER — LEVOTHYROXINE SODIUM 75 MCG PO TABS
175.0000 ug | ORAL_TABLET | Freq: Every day | ORAL | Status: DC
  Administered 2019-11-25 – 2019-12-01 (×7): 175 ug via ORAL
  Filled 2019-11-25 (×7): qty 1

## 2019-11-25 MED ORDER — TORSEMIDE 20 MG PO TABS
20.0000 mg | ORAL_TABLET | Freq: Two times a day (BID) | ORAL | Status: DC
  Filled 2019-11-25: qty 1

## 2019-11-25 MED ORDER — SODIUM CHLORIDE 0.9 % IV SOLN
510.0000 mg | Freq: Once | INTRAVENOUS | Status: AC
  Administered 2019-11-25: 510 mg via INTRAVENOUS
  Filled 2019-11-25: qty 17

## 2019-11-25 MED ORDER — IPRATROPIUM-ALBUTEROL 0.5-2.5 (3) MG/3ML IN SOLN
3.0000 mL | Freq: Four times a day (QID) | RESPIRATORY_TRACT | Status: DC
  Administered 2019-11-25: 3 mL via RESPIRATORY_TRACT
  Filled 2019-11-25: qty 3

## 2019-11-25 MED ORDER — DOXYCYCLINE HYCLATE 100 MG PO TABS
100.0000 mg | ORAL_TABLET | Freq: Two times a day (BID) | ORAL | Status: AC
  Administered 2019-11-25 – 2019-11-29 (×10): 100 mg via ORAL
  Filled 2019-11-25 (×10): qty 1

## 2019-11-25 MED ORDER — BUDESONIDE 0.5 MG/2ML IN SUSP
0.5000 mg | Freq: Two times a day (BID) | RESPIRATORY_TRACT | Status: DC
  Administered 2019-11-25 – 2019-12-01 (×11): 0.5 mg via RESPIRATORY_TRACT
  Filled 2019-11-25 (×11): qty 2

## 2019-11-25 MED ORDER — ALBUTEROL SULFATE (2.5 MG/3ML) 0.083% IN NEBU: 3.0000 mL | INHALATION_SOLUTION | Freq: Four times a day (QID) | RESPIRATORY_TRACT | Status: DC | PRN

## 2019-11-25 MED ORDER — PREDNISONE 20 MG PO TABS
50.0000 mg | ORAL_TABLET | Freq: Every day | ORAL | Status: DC
  Filled 2019-11-25: qty 2

## 2019-11-25 MED ORDER — FUROSEMIDE 10 MG/ML IJ SOLN
40.0000 mg | Freq: Once | INTRAMUSCULAR | Status: DC
  Filled 2019-11-25: qty 4

## 2019-11-25 NOTE — Evaluation (Signed)
Physical Therapy Evaluation Patient Details Name: Jamie Montoya MRN: [id number] DOB: 08-30-1945 Today's Date: 11/25/2019   History of Present Illness  74 y.o. female with a hx of HFpEF, Crohn's, wheelchair bound, PSVT, COPD with chronic respiratory failure on home O2, RBBB, hypothyroidism, HLD, CKD stage 3, chronic leg edema, esophageal stricture and possible malignancy who is being seen for the evaluation of shortness of breath.  Clinical Impression  Pt admitted with above diagnosis. Pt was unable to get OOB with bed level eval performed due to weakness/awaiting blood transfusion. Completed bed exercises today.  Hopeful that pt will progress well once medically managed.  Pt currently with functional limitations due to the deficits listed below (see PT Problem List). Pt will benefit from skilled PT to increase their independence and safety with mobility to allow discharge to the venue listed below.      Follow Up Recommendations Home health PT;Supervision/Assistance - 24 hour    Equipment Recommendations  None recommended by PT(unless pt can get wider wheelchair and 3N1)    Recommendations for Other Services       Precautions / Restrictions Precautions Precautions: Fall Restrictions Weight Bearing Restrictions: No      Mobility  Bed Mobility Overal bed mobility: Needs Assistance Bed Mobility: Rolling Rolling: Min assist;Min guard         General bed mobility comments: can roll with rails and cues  Transfers                 General transfer comment: TBA once pt has had blood transfusion, Hgb 6.7  Ambulation/Gait                Stairs            Wheelchair Mobility    Modified Rankin (Stroke Patients Only)       Balance                                             Pertinent Vitals/Pain Pain Assessment: No/denies pain    Home Living Family/patient expects to be discharged to:: Private residence Living Arrangements:  Non-relatives/Friends Available Help at Discharge: Friend(s);Personal care attendant;Available 24 hours/day(Kim is caregiver 24 hour) Type of Home: House Home Access: Ramped entrance     Home Layout: One level Home Equipment: Walker - 2 wheels;Walker - 4 wheels;Adaptive equipment;Wheelchair - Education officer, community - power;Toilet riser;Hospital bed(home O2 2-3L)      Prior Function Level of Independence: Needs assistance   Gait / Transfers Assistance Needed: Pt was walking with rollator with chair follow per caregiver. She doesnt fit well in manual chair per caregiver - it pinches her. She is learning to use her power wheelchair.   ADL's / Homemaking Assistance Needed: Pt has assist with all ADLs by [name].  She can walk to shower and needs assist. She has difficulty making it to bathroom due to colitis and her current 3N1 is small but they wouldnt giver her a larger one as she diddnt fit weight per pt        Hand Dominance   Dominant Hand: Right    Extremity/Trunk Assessment   Upper Extremity Assessment Upper Extremity Assessment: Defer to OT evaluation    Lower Extremity Assessment Lower Extremity Assessment: Generalized weakness    Cervical / Trunk Assessment Cervical / Trunk Assessment: Kyphotic  Communication   Communication: No difficulties  Cognition Arousal/Alertness: Awake/alert Behavior  During Therapy: WFL for tasks assessed/performed Overall Cognitive Status: Within Functional Limits for tasks assessed                                        General Comments General comments (skin integrity, edema, etc.): 84 bpm, RR 30-39, O2 88% on 5-6 LO2 with activity and 90% and > at rest on 5L.     Exercises General Exercises - Upper Extremity Shoulder Flexion: AROM;Both;10 reps;Supine Elbow Flexion: AROM;Both;10 reps;Supine General Exercises - Lower Extremity Ankle Circles/Pumps: AROM;Both;10 reps;Supine Heel Slides: AROM;Both;10 reps;Supine    Assessment/Plan    PT Assessment Patient needs continued PT services  PT Problem List Decreased balance;Decreased mobility;Decreased activity tolerance;Decreased knowledge of use of DME;Decreased safety awareness;Decreased knowledge of precautions;Cardiopulmonary status limiting activity;Decreased skin integrity;Obesity       PT Treatment Interventions DME instruction;Gait training;Functional mobility training;Therapeutic activities;Therapeutic exercise;Balance training;Patient/family education    PT Goals (Current goals can be found in the Care Plan section)  Acute Rehab PT Goals Patient Stated Goal: to go home PT Goal Formulation: With patient Time For Goal Achievement: 12/09/19 Potential to Achieve Goals: Good    Frequency Min 3X/week   Barriers to discharge        Co-evaluation               AM-PAC PT "6 Clicks" Mobility  Outcome Measure Help needed turning from your back to your side while in a flat bed without using bedrails?: A Little Help needed moving from lying on your back to sitting on the side of a flat bed without using bedrails?: A Little Help needed moving to and from a bed to a chair (including a wheelchair)?: A Little Help needed standing up from a chair using your arms (e.g., wheelchair or bedside chair)?: A Lot Help needed to walk in hospital room?: A Lot Help needed climbing 3-5 steps with a railing? : A Lot 6 Click Score: 15    End of Session Equipment Utilized During Treatment: Gait belt;Oxygen Activity Tolerance: Patient limited by fatigue Patient left: with call bell/phone within reach;in bed;with nursing/sitter in room Nurse Communication: Mobility status PT Visit Diagnosis: Muscle weakness (generalized) (M62.81)    Time: 1035-1101 PT Time Calculation (min) (ACUTE ONLY): 26 min   Charges:   PT Evaluation $PT Eval Moderate Complexity: 1 Mod PT Treatments $Therapeutic Exercise: 8-22 mins        Amazing Cowman W,PT Acute Rehabilitation  Services Pager:  [phone]  Office:  [place] 11/25/2019, 2:14 PM

## 2019-11-25 NOTE — Progress Notes (Signed)
PROGRESS NOTE  Jamie Montoya FHL:[id number] DOB: 05-09-1946   PCP: System, Pcp Not In  Patient is from: Home.  Lives alone.  Reports 24/7 care.  Wheelchair-bound.  DOA: 11/24/2019 LOS: 0  Brief Narrative / Interim history: 74 year old female with history of COPD/RF on 2 L, diastolic CHF, SVT, PVD, HTN, UC, chronic diarrhea, hypothyroidism, CKD-3 and debility presenting with progressive shortness of breath, edema,, productive cough, palpitation and chills.   She was admitted for acute on chronic respiratory failure due to COPD exacerbation.  Also found to be anemic to 7.1 (baseline~9).  BNP 500 (previously about 2000).  Troponin negative x2.  The next day, Hgb dropped to 6.7.  Lovenox discontinued.  Transfused 1 unit.  GI consulted.   Subjective: Seen and examined earlier this morning.  She was slightly hypotensive with tachycardia and tachypnea overnight.  Still tachypneic to 20s.  Still with shortness of breath and generalized weakness.  Denies abdominal pain, melena or hematochezia  Objective: Vitals:   11/25/19 0652 11/25/19 0815 11/25/19 0905 11/25/19 1303  BP: (!) 105/44 107/60  114/68  Pulse: 70 74  75  Resp: (!) 28 (!) 21 (!) 27 (!) 22  Temp: (!) 97.3 F (36.3 C) 98.1 F (36.7 C)  98.3 F (36.8 C)  TempSrc: Axillary Oral  Oral  SpO2: 95% 96% 97% 98%  Weight:        Intake/Output Summary (Last 24 hours) at 11/25/2019 1436 Last data filed at 11/25/2019 0400 Gross per 24 hour  Intake 250 ml  Output --  Net 250 ml   Filed Weights   11/25/19 0400  Weight: 116 kg    Examination:  GENERAL: No apparent distress.  Nontoxic. HEENT: MMM.  Vision and hearing grossly intact.  NECK: Supple.  No apparent JVD but difficult exam.  RESP: 96% on 4 L.  No IWOB.  Fair aeration bilaterally. CVS:  RRR. Heart sounds normal.  ABD/GI/GU: BS+. Abd soft, NTND.  MSK/EXT:  Moves extremities.  Chronic venous insufficiency. SKIN: no apparent skin lesion or wound NEURO: Awake, alert and  oriented appropriately.  No apparent focal neuro deficit. PSYCH: Calm. Normal affect.   Procedures:  None  Microbiology summarized: COVID-19 PCR negative.  Assessment & Plan: Acute on chronic respiratory failure likely due to COPD exacerbation and symptomatic anemia. -Treat treatable etiologies as below. -Wean oxygen as able -Incentive spirometry  Symptomatic iron deficiency anemia: Baseline Hgb ~9.0> 7.1 (admit)> 6.7>1u>>>.  Iron sat 8%.  She denies melena or hematochezia but history of UC. -Transfuse 1 unit with IV Lasix. -FOBT and GI consult-input appreciated. -Discontinue Lovenox.  SCD for VT prophylaxis.  COPD exacerbation: has cardinal symptoms but multiple confounders. -Pulmicort, Incruse Ellipta, Brovana, doxycycline and as needed DuoNeb. -Patient is hesitant about systemic steroids   Chronic diastolic CHF: Stable. -Continue home torsemide on 6/4.  Received IV Lasix with blood transfusion -Monitor fluid status and renal function.  Paroxysmal SVT: TSHwithinnormal. -Per cardiology.  AKI on CKD-3A/azotemia: Baseline Cr ~1.6> 1.77 (admit)> 2.0 -Avoid nephrotoxic meds -Recheck in the morning  Ulcerative colitis with chronic diarrhea-no clinical signs of infection -Agree with C. difficile testing to exclude -GI following  Essential hypertension: Normotensive.  Other meds at home. -Continue monitoring  Hypothyroidism: TSH within normal -Continue home Synthroid  Adnexal mass with endometrioma history of thickening 1.7 cm: Noted on CT abdomen pelvis on 11/10/2019.  Increased size. -Involve GYN.  Not sure if she is able to tolerate TVUS.  Debility-wheelchair-bound at baseline.  Reports 24/7 home care -PT/OT  DVT prophylaxis: SCD Code Status: Full code Family Communication: Patient and/or RN. Available if any question.  Status is: Inpatient  Remains inpatient appropriate because:Ongoing diagnostic testing needed not appropriate for outpatient work up  and Inpatient level of care appropriate due to severity of illness.  Symptomatic anemia requiring blood transfusion and COPD exacerbation   Dispo: The patient is from: Home              Anticipated d/c is to: Home              Anticipated d/c date is: 2 days              Patient currently is not medically stable to d/c.       Consultants:  GI Cardiology   Sch Meds:  Scheduled Meds: . sodium chloride   Intravenous Once  . arformoterol  15 mcg Nebulization BID  . budesonide (PULMICORT) nebulizer solution  0.5 mg Nebulization BID  . doxycycline  100 mg Oral Q12H  . ferrous sulfate  325 mg Oral Q breakfast  . levothyroxine  175 mcg Oral Daily  . sodium chloride flush  3 mL Intravenous Once  . umeclidinium bromide  1 puff Inhalation Daily   Continuous Infusions: . ferumoxytol     PRN Meds:.ipratropium-albuterol  Antimicrobials: Anti-infectives (From admission, onward)   Start     Dose/Rate Route Frequency Ordered Stop   11/25/19 1000  doxycycline (VIBRA-TABS) tablet 100 mg     100 mg Oral Every 12 hours 11/25/19 0026 11/30/19 0959   11/24/19 2245  azithromycin (ZITHROMAX) 500 mg in sodium chloride 0.9 % 250 mL IVPB     500 mg 250 mL/hr over 60 Minutes Intravenous  Once 11/24/19 2232 11/25/19 0443       I have personally reviewed the following labs and images: CBC: Recent Labs  Lab 11/24/19 1739 11/25/19 0622  WBC 4.1 4.0  HGB 7.1* 6.7*  HCT 26.2* 24.5*  MCV 111.0* 109.9*  PLT 129* 124*   BMP &GFR Recent Labs  Lab 11/24/19 1739 11/25/19 0622  NA 145 145  K 4.2 4.1  CL 98 99  CO2 36* 37*  GLUCOSE 82 85  BUN 44* 46*  CREATININE 1.77* 2.00*  CALCIUM 7.6* 7.3*  MG  --  1.8  PHOS  --  4.2   Estimated Creatinine Clearance: 30.4 mL/min (A) (by C-G formula based on SCr of 2 mg/dL (H)). Liver & Pancreas: Recent Labs  Lab 11/25/19 0622  ALBUMIN 2.8*   No results for input(s): LIPASE, AMYLASE in the last 168 hours. No results for input(s): AMMONIA in  the last 168 hours. Diabetic: No results for input(s): HGBA1C in the last 72 hours. No results for input(s): GLUCAP in the last 168 hours. Cardiac Enzymes: No results for input(s): CKTOTAL, CKMB, CKMBINDEX, TROPONINI in the last 168 hours. Recent Labs    09/29/19 1616 10/22/19 1220  PROBNP 1,266* 2,944*   Coagulation Profile: No results for input(s): INR, PROTIME in the last 168 hours. Thyroid Function Tests: Recent Labs    11/25/19 0622  TSH 1.081   Lipid Profile: No results for input(s): CHOL, HDL, LDLCALC, TRIG, CHOLHDL, LDLDIRECT in the last 72 hours. Anemia Panel: Recent Labs    11/25/19 0622  VITAMINB12 727  FOLATE 12.9  FERRITIN 93  TIBC 284  IRON 22*  RETICCTPCT 4.2*   Urine analysis: No results found for: COLORURINE, APPEARANCEUR, LABSPEC, PHURINE, GLUCOSEU, HGBUR, BILIRUBINUR, KETONESUR, PROTEINUR, UROBILINOGEN, NITRITE, LEUKOCYTESUR Sepsis Labs: Invalid input(s): PROCALCITONIN,  [place]  Microbiology: Recent Results (from the past 240 hour(s))  SARS Coronavirus 2 by RT PCR (hospital order, performed in [facility] [address] [address] hospital lab) Nasopharyngeal Nasopharyngeal Swab     Status: None   Collection Time: 11/24/19 11:50 PM   Specimen: Nasopharyngeal Swab  Result Value Ref Range Status   SARS Coronavirus 2 NEGATIVE NEGATIVE Final    Comment: (NOTE) SARS-CoV-2 target nucleic acids are NOT DETECTED. The SARS-CoV-2 RNA is generally detectable in upper and lower respiratory specimens during the acute phase of infection. The lowest concentration of SARS-CoV-2 viral copies this assay can detect is 250 copies / mL. A negative result does not preclude SARS-CoV-2 infection and should not be used as the sole basis for treatment or other patient management decisions.  A negative result may occur with improper specimen collection / handling, submission of specimen other than nasopharyngeal swab, presence of viral mutation(s) within the areas targeted by this assay, and  inadequate number of viral copies (<250 copies / mL). A negative result must be combined with clinical observations, patient history, and epidemiological information. Fact Sheet for Patients:   StrictlyIdeas.no Fact Sheet for Healthcare Providers: BankingDealers.co.za This test is not yet approved or cleared  by the [place] FDA and has been authorized for detection and/or diagnosis of SARS-CoV-2 by FDA under an Emergency Use Authorization (EUA).  This EUA will remain in effect (meaning this test can be used) for the duration of the COVID-19 declaration under Section 564(b)(1) of the Act, 21 U.S.C. section 360bbb-3(b)(1), unless the authorization is terminated or revoked sooner. Performed at [facility], [place] [address] [place], [place] [address]     Radiology Studies: DG Chest 2 View  Result Date: 11/24/2019 CLINICAL DATA:  Shortness of breath EXAM: CHEST - 2 VIEW COMPARISON:  08/19/2019 FINDINGS: Cardiomegaly. Small right pleural effusion. Bibasilar atelectasis or infiltrates, right greater than left. No overt edema. No acute bony abnormality. IMPRESSION: Small right pleural effusion.  Bibasilar atelectasis or infiltrates. Cardiomegaly. Electronically Signed   By: Rolm Baptise M.D.   On: 11/24/2019 18:14      Keyler Hoge T. [place]  If 7PM-7AM, please contact night-coverage [email or web address] Password [facility] 11/25/2019, 2:36 PM

## 2019-11-25 NOTE — ED Notes (Signed)
Report given to 6E RN 

## 2019-11-25 NOTE — Progress Notes (Signed)
CRITICAL VALUE ALERT  Critical Value:  pco2 73  Date & Time Notied:  6/3 2322  Provider Notified: X. Blount  Orders Received/Actions taken: order for bipap  Elesa Hacker, RN
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
## 2019-11-25 NOTE — ED Notes (Signed)
Report attempt x 1 

## 2019-11-25 NOTE — Progress Notes (Addendum)
Progress Note  Patient Name: Jamie Montoya Date of Encounter: 11/25/2019  Primary Cardiologist: Ena Dawley, MD   Subjective   Patient is still feeling generally poor. She is on 4L O2. No chest pain.   Inpatient Medications    Scheduled Meds: . doxycycline  100 mg Oral Q12H  . enoxaparin (LOVENOX) injection  40 mg Subcutaneous Q24H  . ferrous sulfate  325 mg Oral Q breakfast  . ipratropium-albuterol  3 mL Nebulization Q6H  . levothyroxine  175 mcg Oral Daily  . sodium chloride flush  3 mL Intravenous Once  . torsemide  20 mg Oral BID   Continuous Infusions:  PRN Meds: albuterol   Vital Signs    Vitals:   11/25/19 0430 11/25/19 0503 11/25/19 0610 11/25/19 0652  BP: 112/85 (!) 106/50 (!) 99/32 (!) 105/44  Pulse:  80 79 70  Resp:  17 20 (!) 28  Temp:  (!) 97.4 F (36.3 C) (!) 97.4 F (36.3 C) (!) 97.3 F (36.3 C)  TempSrc:  Oral Axillary Axillary  SpO2:  93% 90% 95%  Weight:        Intake/Output Summary (Last 24 hours) at 11/25/2019 0707 Last data filed at 11/25/2019 0400 Gross per 24 hour  Intake 250 ml  Output --  Net 250 ml   Last 3 Weights 11/25/2019 09/29/2019 07/21/2017  Weight (lbs) 255 lb 12.8 oz 234 lb 292 lb  Weight (kg) 116.03 kg 106.142 kg 132.45 kg      Telemetry    Sinus with HR 70-60s, previously appears to have been in ?afib rates 110-120  - Personally Reviewed  ECG    NSR with RBBB, 81bpm - Personally Reviewed  Physical Exam   GEN: No acute distress.   Neck: No JVD Cardiac: RRR, no murmurs, rubs, or gallops.  Respiratory: Clear to auscultation bilaterally. GI: Soft, nontender, non-distended  MS: Trace edema; No deformity. Neuro:  Nonfocal  Psych: Normal affect   Labs    High Sensitivity Troponin:   Recent Labs  Lab 11/24/19 1739 11/24/19 2020  TROPONINIHS 17 14      Chemistry Recent Labs  Lab 11/24/19 1739  NA 145  K 4.2  CL 98  CO2 36*  GLUCOSE 82  BUN 44*  CREATININE 1.77*  CALCIUM 7.6*  GFRNONAA 28*  GFRAA  32*  ANIONGAP 11     Hematology Recent Labs  Lab 11/24/19 1739  WBC 4.1  RBC 2.36*  HGB 7.1*  HCT 26.2*  MCV 111.0*  MCH 30.1  MCHC 27.1*  RDW 16.2*  PLT 129*    BNP Recent Labs  Lab 11/24/19 1739  BNP 589.6*     DDimer No results for input(s): DDIMER in the last 168 hours.   Radiology    DG Chest 2 View  Result Date: 11/24/2019 CLINICAL DATA:  Shortness of breath EXAM: CHEST - 2 VIEW COMPARISON:  08/19/2019 FINDINGS: Cardiomegaly. Small right pleural effusion. Bibasilar atelectasis or infiltrates, right greater than left. No overt edema. No acute bony abnormality. IMPRESSION: Small right pleural effusion.  Bibasilar atelectasis or infiltrates. Cardiomegaly. Electronically Signed   By: Rolm Baptise M.D.   On: 11/24/2019 18:14    Cardiac Studies   Echo 04/2019 Summary  1. The left ventricle is mildly dilated in size with normal wall thickness.  2. Normal left ventricular size and systolic function, ejection fraction > 55%.  3. Diastolic dysfunction - grade II (elevated filling pressures).  4. Dilated left atrium - mildly to moderately dilated.  5. Degenerative mitral valve disease - mildly thickened.  6. Mitral regurgitation - moderate.  7. Aortic sclerosis.  8. The right ventricle is mildly dilated in size, with normal systolic function.  9. Tricuspid regurgitation - mild.  10. Mildly elevated right atrial pressure.  11. Mild pulmonary hypertension, estimated pulmonary artery systolic pressure is 37 mmHg.  Patient Profile     74 y.o. female with a hx of HFpEF, Crohn's, wheelchair bound, PSVT, COPD with chronic respiratory failure on home O2, RBBB, hypothyroidism, HLD, CKD stage 3, chronic leg edema, esophageal stricture and possible malignancy who is being seen for the evaluation of shortness of breath.  Assessment & Plan    SOB/Acute on Chronic respiratory failure/COPD pm 2 L O2 - admitted on 3LO2 - abx - per primary - Tele shows possible  afib RVR yesterday rates up to 120 however now in sinus. Will review with MD  Chronic diastolic heart failure - Patient has recently made good progress in the outpatient setting regarding for CHF management. She is on torsemide at home - BNP elevated at 589, however is less that 1 month ago at 2000 - CXR with small rt pleural effusion, atelectasis vs infiltrate - Echo from November 2020 showed normal LV function, G2DD, normal RV function, mild pulmonary HTN at 37 mmHg - Home torsemide 20 mg BID was continued on admission given it was not felt she was in acute exacerbation - Strict I/Os, daily weights, monitor creatinine - Not significantly volume up on exam. Defer diuresis to MD.   CKD stage 3 - creatinine 1.77 on admission which is elevated from prior at 1.57 - AM labs pending  Anemia of chronic disease - Hgb at 7.1 - In 2019 Hgb around 9  Hypothyroidism - check TSH  HTN - Borderline on torsemide  For questions or updates, please contact [facility] Please consult [email or web address] for contact info under        Signed, Cadence Ninfa Meeker, PA-C  11/25/2019, 7:07 AM    History and all data above reviewed.  Patient examined.  I agree with the findings as above.  We were called to see the patient with dyspnea and weakness.  She did not appear to be overtly volume overloaded.  She has anemia and today her Hgb is less than 7.  I reviewed notes in Care Everywhere.  She has a right adnexal mass.  She has endometrial thickening.  There os a left ovarian abnormality.  She has an elevated CEA concerning ofr GI malignancy.  She has not had a recent colonoscopy but it was mentioned that she might need this.  She has no acute chest pain and has chronic DOE and is on 4 liters.  Of note she did have atrial fib last night.   The patient exam reveals COR:RRR  ,  Lungs: Decreased breath sounds, few basilar crackles  ,  Abd: Positive bowel sounds, no rebound no guarding, Ext:  Chronic venous stasis changes,  mod edema.   .  All available labs, radiology testing, previous records reviewed. Agree with documented assessment and plan.    I would continue PO Torsemide home dose.   I will defer to her primary team any GI work up but this is likely indicated and if thought necessary she would have no cardiac contraindications to colonoscopy.  I agree with transfusion slowly.  (She might need IV diuresis after this but we can follow.)  Ultimately, we will need to consider DOAC but not until all of  the other issues are evaluated.    Jeneen Rinks [facility]  10:36 AM  11/25/2019
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
## 2019-11-25 NOTE — Progress Notes (Signed)
CRITICAL VALUE ALERT  Critical value received:  Hemoglobin 6.7  Date of notification: 11/25/19  Time of notification:  950  Critical value read back:Yes.    Nurse who received alert:  Dina Rich RN  MD notified (1st page):  Dr. Cyndia Skeeters  Time of first page:  951  MD notified (2nd page):  Time of second page:  Responding MD: Dr. Cyndia Skeeters arrived on unit  Time MD responded:  953

## 2019-11-25 NOTE — Progress Notes (Signed)
   11/25/19 0400  Assess: MEWS Score  Temp 98.2 F (36.8 C)  BP (!) 92/51  Pulse Rate (!) 124  ECG Heart Rate (!) 128  Resp (!) 22  Level of Consciousness Alert  SpO2 93 %  O2 Device Nasal Cannula  O2 Flow Rate (L/min) 3 L/min  Assess: MEWS Score  MEWS Temp 0  MEWS Systolic 1  MEWS Pulse 2  MEWS RR 1  MEWS LOC 0  MEWS Score 4  MEWS Score Color Red  Assess: if the MEWS score is Yellow or Red  Were vital signs taken at a resting state? Yes  Focused Assessment Documented focused assessment  Early Detection of Sepsis Score *See Row Information* Medium  MEWS guidelines implemented *See Row Information* Yes  Treat  MEWS Interventions Escalated (See documentation below)  Take Vital Signs  Increase Vital Sign Frequency  Red: Q 1hr X 4 then Q 4hr X 4, if remains red, continue Q 4hrs  Escalate  MEWS: Escalate Red: discuss with charge nurse/RN and provider, consider discussing with RRT  Notify: Charge Nurse/RN  Name of Charge Nurse/RN Notified Chanda Busing, RN  Date Charge Nurse/RN Notified 11/25/19  Time Charge Nurse/RN Notified 0400  Notify: Rapid Response  Name of Rapid Response RN Notified David  Date Rapid Response Notified 11/25/19  Time Rapid Response Notified [place], RN

## 2019-11-26 ENCOUNTER — Inpatient Hospital Stay (HOSPITAL_COMMUNITY): Payer: Medicare Other

## 2019-11-26 LAB — TYPE AND SCREEN
ABO/RH(D): AB POS
Antibody Screen: NEGATIVE
Unit division: 0

## 2019-11-26 LAB — CBC
HCT: 28.2 % — ABNORMAL LOW (ref 36.0–46.0)
Hemoglobin: 8 g/dL — ABNORMAL LOW (ref 12.0–15.0)
MCH: 30 pg (ref 26.0–34.0)
MCHC: 28.4 g/dL — ABNORMAL LOW (ref 30.0–36.0)
MCV: 105.6 fL — ABNORMAL HIGH (ref 80.0–100.0)
Platelets: 135 10*3/uL — ABNORMAL LOW (ref 150–400)
RBC: 2.67 MIL/uL — ABNORMAL LOW (ref 3.87–5.11)
RDW: 17.7 % — ABNORMAL HIGH (ref 11.5–15.5)
WBC: 4.3 10*3/uL (ref 4.0–10.5)
nRBC: 0 % (ref 0.0–0.2)

## 2019-11-26 LAB — RENAL FUNCTION PANEL
Albumin: 2.8 g/dL — ABNORMAL LOW (ref 3.5–5.0)
Anion gap: 10 (ref 5–15)
BUN: 46 mg/dL — ABNORMAL HIGH (ref 8–23)
CO2: 33 mmol/L — ABNORMAL HIGH (ref 22–32)
Calcium: 6.9 mg/dL — ABNORMAL LOW (ref 8.9–10.3)
Chloride: 99 mmol/L (ref 98–111)
Creatinine, Ser: 1.77 mg/dL — ABNORMAL HIGH (ref 0.44–1.00)
GFR calc Af Amer: 32 mL/min — ABNORMAL LOW (ref >60)
GFR calc non Af Amer: 28 mL/min — ABNORMAL LOW (ref >60)
Glucose, Bld: 112 mg/dL — ABNORMAL HIGH (ref 70–99)
Phosphorus: 4.5 mg/dL (ref 2.5–4.6)
Potassium: 4.9 mmol/L (ref 3.5–5.1)
Sodium: 142 mmol/L (ref 135–145)

## 2019-11-26 LAB — C DIFFICILE QUICK SCREEN W PCR REFLEX
C Diff antigen: POSITIVE — AB
C Diff toxin: NEGATIVE

## 2019-11-26 LAB — BPAM RBC
Blood Product Expiration Date: 202106122359
ISSUE DATE / TIME: 202106031638
Unit Type and Rh: 8400

## 2019-11-26 LAB — MAGNESIUM: Magnesium: 1.8 mg/dL (ref 1.7–2.4)

## 2019-11-26 LAB — CLOSTRIDIUM DIFFICILE BY PCR, REFLEXED: Toxigenic C. Difficile by PCR: POSITIVE — AB

## 2019-11-26 IMAGING — DX DG CHEST 1V PORT
1 series · 1 of 1 positions shown · non-contrast
Comparison: [DATE]

CLINICAL DATA: Hypoxia

EXAM:
PORTABLE CHEST 1 VIEW

[chest ap]
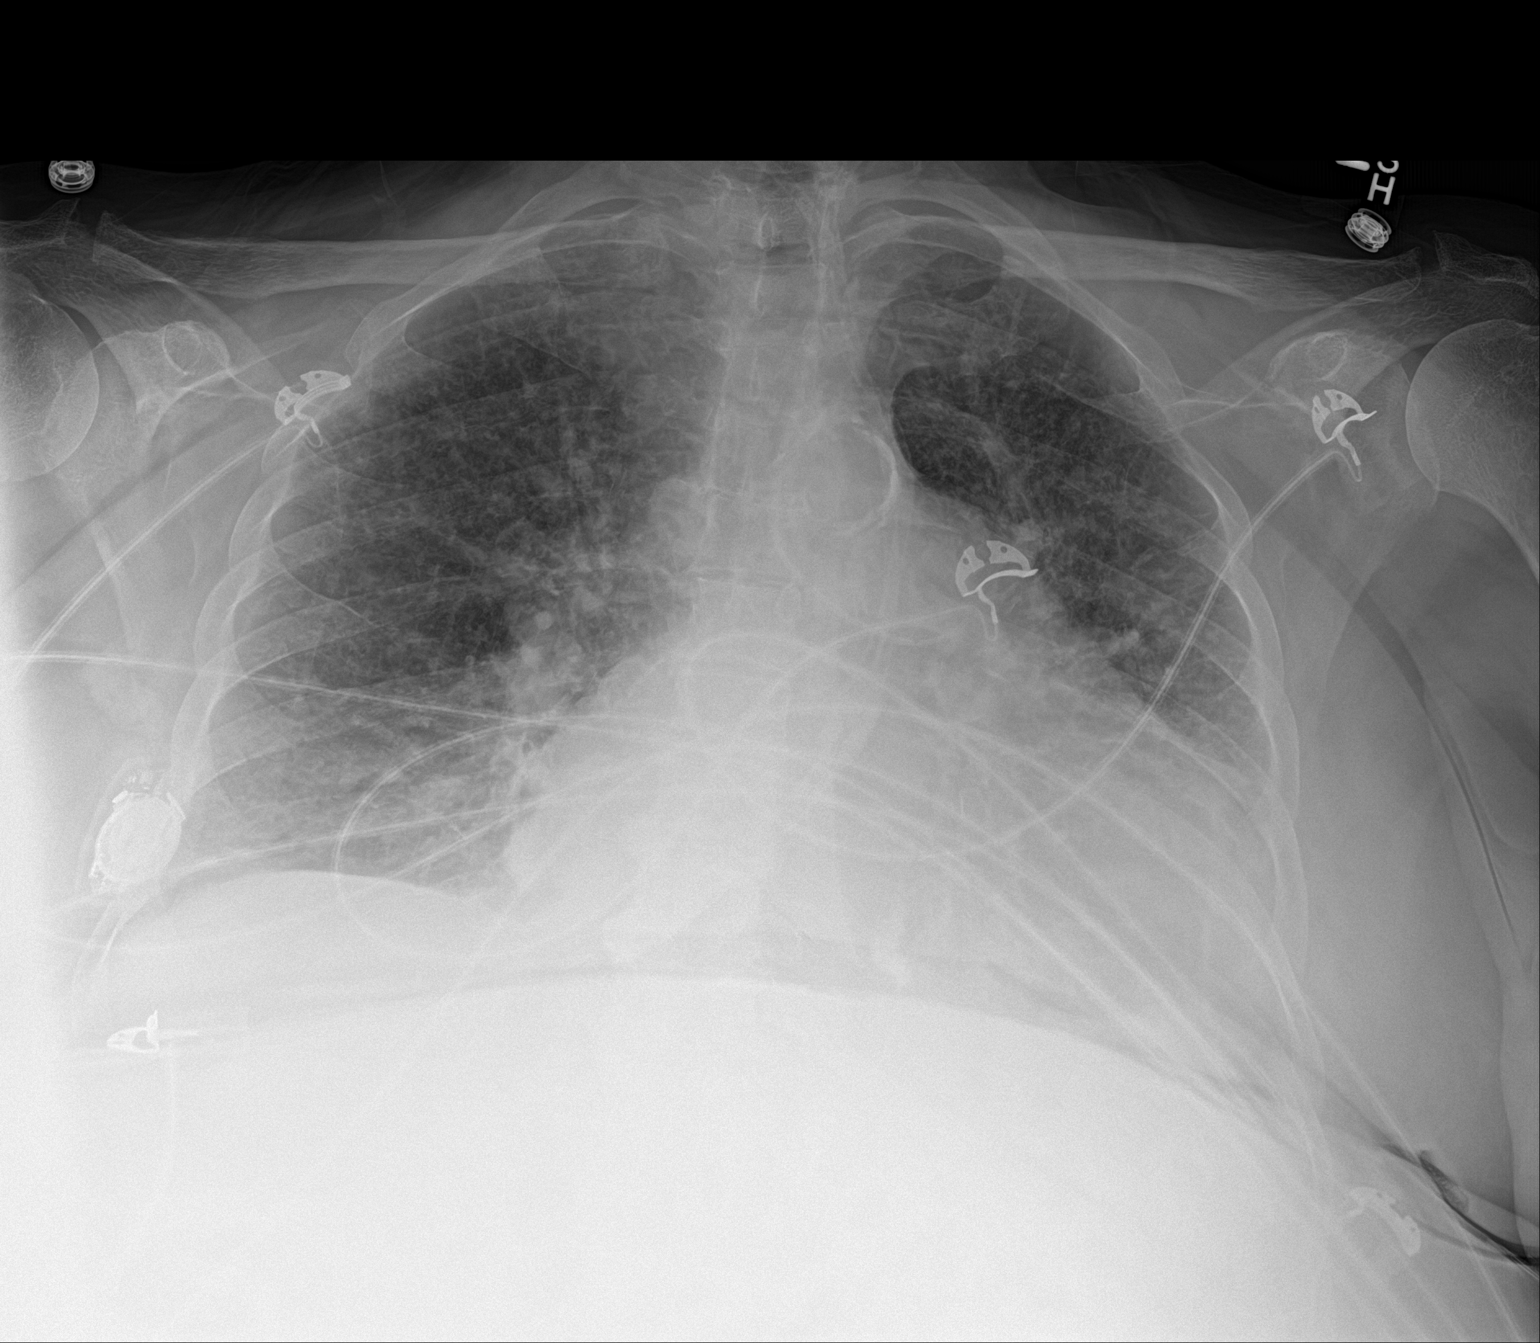

[1 of 1 positions shown; findings below may reference images not displayed]

FINDINGS: Cardiac enlargement. Vascular congestion with interstitial edema has
progressed. No significant pleural effusion. Bibasilar atelectasis.
IMPRESSION: Progression of bilateral airspace disease most compatible with
interstitial edema.

## 2019-11-26 MED ORDER — LOPERAMIDE HCL 2 MG PO CAPS
4.0000 mg | ORAL_CAPSULE | Freq: Every day | ORAL | Status: DC
  Administered 2019-11-26 – 2019-11-27 (×2): 4 mg via ORAL
  Filled 2019-11-26 (×2): qty 2

## 2019-11-26 MED ORDER — FUROSEMIDE 10 MG/ML IJ SOLN
40.0000 mg | Freq: Once | INTRAMUSCULAR | Status: AC
  Administered 2019-11-26: 40 mg via INTRAVENOUS
  Filled 2019-11-26: qty 4

## 2019-11-26 MED ORDER — TORSEMIDE 20 MG PO TABS
20.0000 mg | ORAL_TABLET | Freq: Every day | ORAL | Status: DC
  Administered 2019-11-26: 20 mg via ORAL
  Filled 2019-11-26: qty 1

## 2019-11-26 MED ORDER — VANCOMYCIN 50 MG/ML ORAL SOLUTION
125.0000 mg | Freq: Four times a day (QID) | ORAL | Status: DC
  Administered 2019-11-26 – 2019-12-01 (×21): 125 mg via ORAL
  Filled 2019-11-26 (×23): qty 2.5

## 2019-11-26 NOTE — Progress Notes (Addendum)
Progress Note  Patient Name: Jamie Montoya Date of Encounter: 11/26/2019  [facility] Cardiologist: Ena Dawley, MD   Subjective   Hgb came back at 6.7 and patient was given 1 unit PRBCs. GI consulted. Patient was in afib over night now back in sinus.   Inpatient Medications    Scheduled Meds: . arformoterol  15 mcg Nebulization BID  . budesonide (PULMICORT) nebulizer solution  0.5 mg Nebulization BID  . doxycycline  100 mg Oral Q12H  . ferrous sulfate  325 mg Oral Q breakfast  . levothyroxine  175 mcg Oral Daily  . sodium chloride flush  3 mL Intravenous Once  . umeclidinium bromide  1 puff Inhalation Daily   Continuous Infusions:  PRN Meds: ipratropium-albuterol   Vital Signs    Vitals:   11/25/19 2351 11/25/19 2359 11/26/19 0213 11/26/19 0558  BP: 117/80   121/69  Pulse: 82 82 72 82  Resp: (!) 21 19 (!) 22 19  Temp: 97.9 F (36.6 C)   98 F (36.7 C)  TempSrc: Oral   Oral  SpO2: 93% 98% 97% 96%  Weight:    120 kg    Intake/Output Summary (Last 24 hours) at 11/26/2019 0743 Last data filed at 11/25/2019 2015 Gross per 24 hour  Intake 477.5 ml  Output 400 ml  Net 77.5 ml   Last 3 Weights 11/26/2019 11/25/2019 09/29/2019  Weight (lbs) 264 lb 8.8 oz 255 lb 12.8 oz 234 lb  Weight (kg) 120 kg 116.03 kg 106.142 kg      Telemetry    Afib overnight with rates to 110, now in sinus - Personally Reviewed  ECG    No new - Personally Reviewed  Physical Exam   GEN: No acute distress.   Neck: No JVD Cardiac: RRR, no murmurs, rubs, or gallops.  Respiratory: Clear to auscultation bilaterally. GI: Soft, nontender, non-distended  MS: No edema; No deformity. Neuro:  Nonfocal  Psych: Normal affect   Labs    High Sensitivity Troponin:   Recent Labs  Lab 11/24/19 1739 11/24/19 2020  TROPONINIHS 17 14      Chemistry Recent Labs  Lab 11/24/19 1739 11/25/19 0622 11/26/19 0355  NA 145 145 142  K 4.2 4.1 4.9  CL 98 99 99  CO2 36* 37* 33*  GLUCOSE 82 85  112*  BUN 44* 46* 46*  CREATININE 1.77* 2.00* 1.77*  CALCIUM 7.6* 7.3* 6.9*  ALBUMIN  --  2.8* 2.8*  GFRNONAA 28* 24* 28*  GFRAA 32* 28* 32*  ANIONGAP 11 9 10      Hematology Recent Labs  Lab 11/24/19 1739 11/24/19 1739 11/25/19 0622 11/25/19 2206 11/25/19 2359  WBC 4.1  --  4.0  --  4.3  RBC 2.36*  --  2.23*  2.20*  --  2.67*  HGB 7.1*   < > 6.7* 8.1* 8.0*  HCT 26.2*   < > 24.5* 28.4* 28.2*  MCV 111.0*  --  109.9*  --  105.6*  MCH 30.1  --  30.0  --  30.0  MCHC 27.1*  --  27.3*  --  28.4*  RDW 16.2*  --  16.0*  --  17.7*  PLT 129*  --  124*  --  135*   < > = values in this interval not displayed.    BNP Recent Labs  Lab 11/24/19 1739  BNP 589.6*     DDimer No results for input(s): DDIMER in the last 168 hours.   Radiology  DG Chest 2 View  Result Date: 11/24/2019 CLINICAL DATA:  Shortness of breath EXAM: CHEST - 2 VIEW COMPARISON:  08/19/2019 FINDINGS: Cardiomegaly. Small right pleural effusion. Bibasilar atelectasis or infiltrates, right greater than left. No overt edema. No acute bony abnormality. IMPRESSION: Small right pleural effusion.  Bibasilar atelectasis or infiltrates. Cardiomegaly. Electronically Signed   By: Rolm Baptise M.D.   On: 11/24/2019 18:14    Cardiac Studies   Echo 04/2019 Summary  1. The left ventricle is mildly dilated in size with normal wall thickness.  2. Normal left ventricular size and systolic function, ejection fraction > 55%.  3. Diastolic dysfunction - grade II (elevated filling pressures).  4. Dilated left atrium - mildly to moderately dilated.  5. Degenerative mitral valve disease - mildly thickened.  6. Mitral regurgitation - moderate.  7. Aortic sclerosis.  8. The right ventricle is mildly dilated in size, with normal systolic function.  9. Tricuspid regurgitation - mild.  10. Mildly elevated right atrial pressure.  11. Mild pulmonary hypertension, estimated pulmonary artery systolic pressure is 37  mmHg.  Patient Profile     74 y.o. female with a hx of HFpEF, Crohn's, wheelchair bound, PSVT, COPD with chronic respiratory failure on home O2, RBBB, hypothyroidism, HLD, CKD stage 3, chronic leg edema, esophageal stricture and possible malignancywho is being seen for the evaluation of shortness of breath.  Assessment & Plan    SOB/Acute on Chronic respiratory failure/COPD 2 L O2 - on 5-6L O2 - Multifactorial given anemia, COPD, Afib - abx per primary  New onset Paroxysmal Afib - Now in sinus - TSH wnl - CHADSVASC = 5 (agex2, female, CHF, HTN) - Patient will ultimately need a/c but given acute anemia will hold  Chronic diastolic heart failure - Patient has recently made good progress in the outpatient setting regarding for CHF management. She is on torsemide at home - BNP elevated at 589, however is less that 1 month ago at 2000 - CXR with small rt pleural effusion, atelectasis vs infiltrate - Echo from November 2020 showed normal LV function, G2DD, normal RV function, mild pulmonary HTN at 37 mmHg - Home torsemide 20 mg BID was continued on admission given it was not felt she was in acute exacerbation - Strict I/Os, daily weights, monitor creatinine - Torsemide discontinued yesterday for rising creatinine/BUN. Euvolemic on exam.  Acute on chronic Anemia  - Hgb 6.7 s/p 1unit PRBC - No overt bleeding at this time. CEA 2000 2 months ago at [facility]. GI considering colonoscopy when patient is more stable from a respiratory perspective - Occult blood ordered - Hgb today 8.0  CKD stage 3 - creatinine 1.77  Hypothyroidism - TSH 1.081  HTN - Stable  For questions or updates, please contact [facility] Please consult [email or web address] for contact info under        Signed, Cadence Ninfa Meeker, PA-C  11/26/2019, 7:43 AM    History and all data above reviewed.  Patient examined.  I agree with the findings as above.  She is very somnolent.  She is still on 5 liters.  She denies  chest pain.  Does not notice when she is out of rhythm.   The patient exam reveals COR:RRR  ,  Lungs: Decreased breath sounds  ,  Abd: Positive bowel sounds, no rebound no guarding, Ext No edema  .  All available labs, radiology testing, previous records reviewed. Agree with documented assessment and plan. Atrial fib:  Unable to anticoagulate with significant anemia.  Now in NSR.  No change in therapy.  Acute on chronic respiratory failure:  I don't think that volume plays the largest role.  Now that her creat is down slightly I will resume her previous PO diuretic.    Jeneen Rinks [facility]  11:32 AM  11/26/2019

## 2019-11-26 NOTE — Progress Notes (Signed)
Physical Therapy Treatment Patient Details Name: Jamie Montoya MRN: [id number] DOB: 17-Aug-1945 Today's Date: 11/26/2019    History of Present Illness 74 y.o. female with a hx of HFpEF, Crohn's, wheelchair bound, PSVT, COPD with chronic respiratory failure on home O2, RBBB, hypothyroidism, HLD, CKD stage 3, chronic leg edema, esophageal stricture and possible malignancy who is being seen for the evaluation of shortness of breath.    PT Comments    Pt coughing on entry, assisted in bringing HoB to upright so pt could have more productive cough. SaO2 on 6L O2 dropped to low 80s with coughing, however quickly rebounded to the 90s once coughing stopped. Pt states she is feeling much better since the blood transfusion and is agreeable to getting up to recliner. Pt is min guard for bed mobility and transfer to recliner using RW. Pt requested to leave LE down for a little while for sitting up in chair. PT advised to leave LE in dependent position for about 30 min and then elevate them to assist in edema reduction. D/c plans remain appropriate. PT will continue to follow acutely.    Follow Up Recommendations  Home health PT;Supervision/Assistance - 24 hour     Equipment Recommendations  None recommended by PT(unless pt can get wider wheelchair and 3N1)       Precautions / Restrictions Precautions Precautions: Fall Restrictions Weight Bearing Restrictions: No    Mobility  Bed Mobility Overal bed mobility: Needs Assistance Bed Mobility: Supine to Sit     Supine to sit: Min guard;HOB elevated     General bed mobility comments: min guard for safety with coming to EoB, HoB elevated and heavy use of bedrail to come to EoB  Transfers Overall transfer level: Needs assistance Equipment used: Rolling walker (2 wheeled) Transfers: Sit to/from Stand Sit to Stand: Min guard         General transfer comment: min guard for safety with sit to stand, able to self steady before performing a  stepping transfer to the recliner on her R        Balance Overall balance assessment: Needs assistance Sitting-balance support: Feet supported;No upper extremity supported Sitting balance-Leahy Scale: Good     Standing balance support: Bilateral upper extremity supported;During functional activity Standing balance-Leahy Scale: Fair                              Cognition Arousal/Alertness: Awake/alert Behavior During Therapy: WFL for tasks assessed/performed Overall Cognitive Status: Within Functional Limits for tasks assessed                                           General Comments General comments (skin integrity, edema, etc.): Pt on 6L O2 via [place], SaO2 >90%O2 with mobility      Pertinent Vitals/Pain Pain Assessment: No/denies pain           PT Goals (current goals can now be found in the care plan section) Acute Rehab PT Goals Patient Stated Goal: to go home PT Goal Formulation: With patient Time For Goal Achievement: 12/09/19 Potential to Achieve Goals: Good Progress towards PT goals: Progressing toward goals    Frequency    Min 3X/week      PT Plan Current plan remains appropriate       AM-PAC PT "6 Clicks" Mobility   Outcome Measure  Help needed turning from your back to your side while in a flat bed without using bedrails?: A Little Help needed moving from lying on your back to sitting on the side of a flat bed without using bedrails?: A Little Help needed moving to and from a bed to a chair (including a wheelchair)?: A Little Help needed standing up from a chair using your arms (e.g., wheelchair or bedside chair)?: A Lot Help needed to walk in hospital room?: A Lot Help needed climbing 3-5 steps with a railing? : A Lot 6 Click Score: 15    End of Session Equipment Utilized During Treatment: Gait belt;Oxygen Activity Tolerance: Patient tolerated treatment well Patient left: with call bell/phone within reach;with  nursing/sitter in room;in chair;with chair alarm set Nurse Communication: Mobility status PT Visit Diagnosis: Muscle weakness (generalized) (M62.81)     Time: 4451-4604 PT Time Calculation (min) (ACUTE ONLY): 21 min  Charges:  $Therapeutic Activity: 8-22 mins                     Mishal Probert B. Migdalia Dk PT, DPT Acute Rehabilitation Services Pager [phone] Office [phone]    [place] 11/26/2019, 1:24 PM

## 2019-11-26 NOTE — Progress Notes (Signed)
Patient was placed on bipap by respiratory around 0000. Patient has intermittently refused bipap since then, stating its "tiring and uncomfortable" but is occasionally agreeable. When patient is off of bipap, she requires 6-8L of O2 on [place]. Education on why the bipap is necessary has been provided by both this RN and respiratory. Will continue to monitor.   Elesa Hacker, RN

## 2019-11-26 NOTE — Progress Notes (Signed)
Jamie NOTE  RUVI FULLENWIDER Montoya:[id number] DOB: 1945-08-27   PCP: System, Pcp Not In  Patient is from: Home.  Lives alone.  Reports 24/7 care.  Wheelchair-bound.  DOA: 11/24/2019 LOS: 1  Brief Narrative / Interim history: 74 year old female with history of COPD/RF on 2 L, diastolic CHF, SVT, PVD, HTN, UC, chronic diarrhea, hypothyroidism, CKD-3 and debility presenting with progressive shortness of breath, edema,, productive cough, palpitation and chills.   She was admitted for acute on chronic respiratory failure due to COPD exacerbation.  Also found to be anemic to 7.1 (baseline~9).  BNP 500 (previously about 2000).  Troponin negative x2.  The next day, Hgb dropped to 6.7.  Lovenox discontinued.  Transfused 1 unit.  GI consulted.  Hemoglobin appropriately improved to 8.1.  Unfortunately she did not receive the dose of IV Lasix ordered after the PRBC unit.  Overnight issues with dyspnea/hypoxia/unable to tolerate BiPAP.  Also C. difficile PCR positive.  Subjective: Overnight events noted.  Dyspnea/hypoxia/unable to tolerate BiPAP-indicates that it suffocates her.  Patient reports that she lives alone, has home health.  Ambulates with the help of a walker-wheelchair.  On home oxygen 2 to 3 L/min.  Not on CPAP at home.  Does not smoke.  Sees Dr. Meda Coffee, cardiology.  Objective: Vitals:   11/26/19 0831 11/26/19 0908 11/26/19 1156 11/26/19 1624  BP: (!) 126/58     Pulse:  78    Resp:  20    Temp: 98.4 F (36.9 C)  (!) 97.5 F (36.4 C) (!) 97.5 F (36.4 C)  TempSrc: Oral  Oral Oral  SpO2:  100%    Weight:        Intake/Output Summary (Last 24 hours) at 11/26/2019 1953 Last data filed at 11/25/2019 2015 Gross per 24 hour  Intake 477.5 ml  Output --  Net 477.5 ml   Filed Weights   11/25/19 0400 11/26/19 0558  Weight: 116 kg 120 kg    Examination: Patient was interviewed and examined along with her female RN in room. GENERAL: Elderly female, moderately built and morbidly obese  lying comfortably propped up in bed without distress.  Saturating in the high 80s on 9 L/min nasal cannula oxygen which was since then weaned down. HEENT: MMM.  Vision and hearing grossly intact.  NECK: Supple.  No apparent JVD but difficult exam.  RESP: Diminished breath sounds in the bases with basal crackles but otherwise clear to auscultation without wheezing or rhonchi.  No increased work of breathing.  Able to speak in full sentences. CVS: S1 and S2 heard, RRR.  No murmurs.  Bilateral lower extremity chronic blistering edema.  Telemetry personally reviewed: Sinus rhythm with BBB morphology.  Occasional A. fib with RVR in the 120s overnight. ABD/GI/GU: BS+. Abd soft, NTND.  MSK/EXT:  Moves extremities.  Chronic venous insufficiency. SKIN: no apparent skin lesion or wound NEURO: Alert and oriented x3.  No apparent focal neuro deficit. PSYCH: Calm. Normal affect.   Procedures:  None  Microbiology summarized: COVID-19 PCR negative.  Assessment & Plan: Acute on chronic respiratory failure likely due to COPD exacerbation and symptomatic anemia. -Treat treatable etiologies as below. -Wean oxygen as able -Incentive spirometry -Also likely an element of decompensated CHF precipitated by PRBCs without IV Lasix.  IV Lasix 40 mg x 1 dose.  Now seems to be saturating in the high 90s on 6 L.  Wean oxygen as tolerated  Symptomatic iron deficiency anemia: Baseline Hgb ~9.0> 7.1 (admit)> 6.7>1u>>>.  Iron sat 8%.  She  denies melena or hematochezia but history of UC. -Transfuse 1 unit with IV Lasix. -FOBT and GI consult-input appreciated. -Discontinue Lovenox.  SCD for VT prophylaxis. -Hemoglobin up to 8.  COPD exacerbation: has cardinal symptoms but multiple confounders. -Pulmicort, Incruse Ellipta, Brovana, doxycycline and as needed DuoNeb. -Patient is hesitant about systemic steroids   Acute on chronic diastolic CHF:  - IV Lasix 40 mg x 1 dose.  Cardiology resuming home dose of oral  diuretic -Monitor fluid status and renal function.  New onset paroxysmal A. fib.  Paroxysmal SVT: TSHwithinnormal. -Per cardiology: Unable to anticoagulate with significant anemia.  AKI on CKD-3A/azotemia: Baseline Cr ~1.6> 1.77 (admit)> 2.0 -Avoid nephrotoxic meds -Creatinine again down to 1.77, recheck in a.m. post IV Lasix.  Ulcerative colitis with chronic diarrhea-no clinical signs of infection -Agree with C. difficile testing to exclude -GI following.  C. difficile PCR positive.  Oral vancomycin initiated by GI.  Essential hypertension: Normotensive.  Other meds at home. -Continue monitoring  Hypothyroidism: TSH within normal -Continue home Synthroid  Adnexal mass with endometrioma history of thickening 1.7 cm: Noted on CT abdomen pelvis on 11/10/2019.  Increased size. -Involve GYN.  Not sure if she is able to tolerate TVUS.  Debility-wheelchair-bound at baseline.  Reports 24/7 home care -PT/OT          DVT prophylaxis: SCD Code Status: Full code Family Communication: Patient and/or RN. Available if any question.  Status is: Inpatient  Remains inpatient appropriate because:Ongoing diagnostic testing needed not appropriate for outpatient work up and Inpatient level of care appropriate due to severity of illness.  Symptomatic anemia requiring blood transfusion and COPD exacerbation   Dispo: The patient is from: Home              Anticipated d/c is to: Home              Anticipated d/c date is: 2 days              Patient currently is not medically stable to d/c.       Consultants:  GI Cardiology   Sch Meds:  Scheduled Meds: . arformoterol  15 mcg Nebulization BID  . budesonide (PULMICORT) nebulizer solution  0.5 mg Nebulization BID  . doxycycline  100 mg Oral Q12H  . ferrous sulfate  325 mg Oral Q breakfast  . levothyroxine  175 mcg Oral Daily  . loperamide  4 mg Oral QAC breakfast  . sodium chloride flush  3 mL Intravenous Once  . torsemide  20 mg Oral  Daily  . umeclidinium bromide  1 puff Inhalation Daily  . vancomycin  125 mg Oral QID   Continuous Infusions:  PRN Meds:.ipratropium-albuterol  Antimicrobials: Anti-infectives (From admission, onward)   Start     Dose/Rate Route Frequency Ordered Stop   11/26/19 1400  vancomycin (VANCOCIN) 50 mg/mL oral solution 125 mg     125 mg Oral 4 times daily 11/26/19 1300 12/06/19 1359   11/25/19 1000  doxycycline (VIBRA-TABS) tablet 100 mg     100 mg Oral Every 12 hours 11/25/19 0026 11/30/19 0959   11/24/19 2245  azithromycin (ZITHROMAX) 500 mg in sodium chloride 0.9 % 250 mL IVPB     500 mg 250 mL/hr over 60 Minutes Intravenous  Once 11/24/19 2232 11/25/19 0443       I have personally reviewed the following labs and images: CBC: Recent Labs  Lab 11/24/19 1739 11/25/19 0622 11/25/19 2206 11/25/19 2359  WBC 4.1 4.0  --  4.3  HGB 7.1* 6.7* 8.1* 8.0*  HCT 26.2* 24.5* 28.4* 28.2*  MCV 111.0* 109.9*  --  105.6*  PLT 129* 124*  --  135*   BMP &GFR Recent Labs  Lab 11/24/19 1739 11/25/19 0622 11/26/19 0355  NA 145 145 142  K 4.2 4.1 4.9  CL 98 99 99  CO2 36* 37* 33*  GLUCOSE 82 85 112*  BUN 44* 46* 46*  CREATININE 1.77* 2.00* 1.77*  CALCIUM 7.6* 7.3* 6.9*  MG  --  1.8 1.8  PHOS  --  4.2 4.5   Estimated Creatinine Clearance: 35 mL/min (A) (by C-G formula based on SCr of 1.77 mg/dL (H)). Liver & Pancreas: Recent Labs  Lab 11/25/19 0622 11/26/19 0355  ALBUMIN 2.8* 2.8*   No results for input(s): LIPASE, AMYLASE in the last 168 hours. No results for input(s): AMMONIA in the last 168 hours. Diabetic: No results for input(s): HGBA1C in the last 72 hours. Recent Labs  Lab 11/25/19 1607  GLUCAP 84   Cardiac Enzymes: No results for input(s): CKTOTAL, CKMB, CKMBINDEX, TROPONINI in the last 168 hours. Recent Labs    09/29/19 1616 10/22/19 1220  PROBNP 1,266* 2,944*   Coagulation Profile: No results for input(s): INR, PROTIME in the last 168 hours. Thyroid  Function Tests: Recent Labs    11/25/19 0622  TSH 1.081   Lipid Profile: No results for input(s): CHOL, HDL, LDLCALC, TRIG, CHOLHDL, LDLDIRECT in the last 72 hours. Anemia Panel: Recent Labs    11/25/19 0622  VITAMINB12 727  FOLATE 12.9  FERRITIN 93  TIBC 284  IRON 22*  RETICCTPCT 4.2*   Urine analysis: No results found for: COLORURINE, APPEARANCEUR, LABSPEC, PHURINE, GLUCOSEU, HGBUR, BILIRUBINUR, KETONESUR, PROTEINUR, UROBILINOGEN, NITRITE, LEUKOCYTESUR Sepsis Labs: Invalid input(s): PROCALCITONIN, [place]  Microbiology: Recent Results (from the past 240 hour(s))  SARS Coronavirus 2 by RT PCR (hospital order, performed in [facility] hospital lab) Nasopharyngeal Nasopharyngeal Swab     Status: None   Collection Time: 11/24/19 11:50 PM   Specimen: Nasopharyngeal Swab  Result Value Ref Range Status   SARS Coronavirus 2 NEGATIVE NEGATIVE Final    Comment: (NOTE) SARS-CoV-2 target nucleic acids are NOT DETECTED. The SARS-CoV-2 RNA is generally detectable in upper and lower respiratory specimens during the acute phase of infection. The lowest concentration of SARS-CoV-2 viral copies this assay can detect is 250 copies / mL. A negative result does not preclude SARS-CoV-2 infection and should not be used as the sole basis for treatment or other patient management decisions.  A negative result may occur with improper specimen collection / handling, submission of specimen other than nasopharyngeal swab, presence of viral mutation(s) within the areas targeted by this assay, and inadequate number of viral copies (<250 copies / mL). A negative result must be combined with clinical observations, patient history, and epidemiological information. Fact Sheet for Patients:   StrictlyIdeas.no Fact Sheet for Healthcare Providers: BankingDealers.co.za This test is not yet approved or cleared  by the [place] FDA and has been  authorized for detection and/or diagnosis of SARS-CoV-2 by FDA under an Emergency Use Authorization (EUA).  This EUA will remain in effect (meaning this test can be used) for the duration of the COVID-19 declaration under Section 564(b)(1) of the Act, 21 U.S.C. section 360bbb-3(b)(1), unless the authorization is terminated or revoked sooner. Performed at [facility], [place] [address] [place], [place] [address]   C Difficile Quick Screen w PCR reflex     Status: Abnormal   Collection  Time: 11/25/19 11:59 PM   Specimen: STOOL  Result Value Ref Range Status   C Diff antigen POSITIVE (A) NEGATIVE Final   C Diff toxin NEGATIVE NEGATIVE Final   C Diff interpretation Results are indeterminate. See PCR results.  Final    Comment: Performed at [facility], [place] [address] [place], [place] [address]  C. Diff by PCR, Reflexed     Status: Abnormal   Collection Time: 11/25/19 11:59 PM  Result Value Ref Range Status   Toxigenic C. Difficile by PCR POSITIVE (A) NEGATIVE Final    Comment: Positive for toxigenic C. difficile with little to no toxin production. Only treat if clinical presentation suggests symptomatic illness. Performed at [facility], [place] [address], [place], [place] [address]     Radiology Studies: DG Chest Port 1 View  Result Date: 11/26/2019 CLINICAL DATA:  Hypoxia EXAM: PORTABLE CHEST 1 VIEW COMPARISON:  11/24/2019 FINDINGS: Cardiac enlargement. Vascular congestion with interstitial edema has progressed. No significant pleural effusion. Bibasilar atelectasis. IMPRESSION: Progression of bilateral airspace disease most compatible with interstitial edema. Electronically Signed   By: Franchot Gallo M.D.   On: 11/26/2019 08:39      Vernell Leep, MD, [place], [facility]. [facility]  To contact the attending provider between 7A-7P or the covering provider during after hours 7P-7A, please log into the web site [email or web address] and access using universal [place]  password for that web site. If you do not have the password, please call the hospital operator.

## 2019-11-26 NOTE — Progress Notes (Signed)
I was called about PCR + c diff test.  Pharmacy to dose vanc as appropriate.

## 2019-11-26 NOTE — Progress Notes (Signed)
[facility] Progress Note    Since last GI note: She's having her usual loose daily stools without overt bleeding and mild abd pains.  Currently eating breakfast with 8L [place] satting 86-89%.  Objective: Vital signs in last 24 hours: Temp:  [97.4 F (36.3 C)-98.3 F (36.8 C)] 98 F (36.7 C) (06/04 0558) Pulse Rate:  [72-114] 82 (06/04 0558) Resp:  [19-39] 19 (06/04 0558) BP: (107-122)/(46-80) 121/69 (06/04 0558) SpO2:  [93 %-98 %] 96 % (06/04 0558) Weight:  [120 kg] 120 kg (06/04 0558) Last BM Date: 11/25/19 General: alert and oriented times 3 Heart: regular rate and rythm Abdomen: soft, non-tender, non-distended, normal bowel sounds  Lab Results: Recent Labs    11/24/19 1739 11/24/19 1739 11/25/19 0622 11/25/19 2206 11/25/19 2359  WBC 4.1  --  4.0  --  4.3  HGB 7.1*   < > 6.7* 8.1* 8.0*  PLT 129*  --  124*  --  135*  MCV 111.0*  --  109.9*  --  105.6*   < > = values in this interval not displayed.   Recent Labs    11/24/19 1739 11/25/19 0622 11/26/19 0355  NA 145 145 142  K 4.2 4.1 4.9  CL 98 99 99  CO2 36* 37* 33*  GLUCOSE 82 85 112*  BUN 44* 46* 46*  CREATININE 1.77* 2.00* 1.77*  CALCIUM 7.6* 7.3* 6.9*   Recent Labs    11/25/19 0622 11/26/19 0355  ALBUMIN 2.8* 2.8*   Studies/Results: DG Chest 2 View  Result Date: 11/24/2019 CLINICAL DATA:  Shortness of breath EXAM: CHEST - 2 VIEW COMPARISON:  08/19/2019 FINDINGS: Cardiomegaly. Small right pleural effusion. Bibasilar atelectasis or infiltrates, right greater than left. No overt edema. No acute bony abnormality. IMPRESSION: Small right pleural effusion.  Bibasilar atelectasis or infiltrates. Cardiomegaly. Electronically Signed   By: Rolm Baptise M.D.   On: 11/24/2019 18:14     Medications: Scheduled Meds: . arformoterol  15 mcg Nebulization BID  . budesonide (PULMICORT) nebulizer solution  0.5 mg Nebulization BID  . doxycycline  100 mg Oral Q12H  . ferrous sulfate  325 mg Oral Q  breakfast  . levothyroxine  175 mcg Oral Daily  . sodium chloride flush  3 mL Intravenous Once  . umeclidinium bromide  1 puff Inhalation Daily   Continuous Infusions: PRN Meds:.ipratropium-albuterol  Assessment/Plan: 74 y.o. female ulcerative colitis, off treatments for at least several months, COPD, CHF  Currently satting 87-89% on 8 liters [place]  She has longstanding UC, her sulfasalazine was stopped by [facility] 6-7 months ago. It doesn't seem like she's any worse since stopping the medicine. Has chronic, non bloody loose stools regardless.    She received one unit of blood yesterday with a very good 'bump' in Hb from 6.7 to 8 last night.  No CBC this morning.  No overt bleeding.  Her CEA was about 2,000 (CA 125 also very elevated) checked through [facility] 2 months ago.  I think they were planning a colonoscopy for her if they could get her cleared by cardiology and stable enough.  For her chronic loose stools I am going to start her on daily scheduled imodium (2 pills).   I do think a colonoscopy is a good idea but she is certainly not stable enough from a respiratory perspective for that now and she may never be stable enough.  Will follow along.  Milus Banister, MD  11/26/2019, 7:49 AM [facility] Pager [phone]

## 2019-11-26 NOTE — Plan of Care (Signed)

## 2019-11-26 NOTE — Progress Notes (Signed)
Pt continues to refuse Bipap this shift. MD aware. O2 sat does drop into the 80's at times, oxygen being titrated. Will continue to monitor.

## 2019-11-27 LAB — BASIC METABOLIC PANEL
Anion gap: 10 (ref 5–15)
BUN: 47 mg/dL — ABNORMAL HIGH (ref 8–23)
CO2: 37 mmol/L — ABNORMAL HIGH (ref 22–32)
Calcium: 7.2 mg/dL — ABNORMAL LOW (ref 8.9–10.3)
Chloride: 99 mmol/L (ref 98–111)
Creatinine, Ser: 2.11 mg/dL — ABNORMAL HIGH (ref 0.44–1.00)
GFR calc Af Amer: 26 mL/min — ABNORMAL LOW (ref >60)
GFR calc non Af Amer: 22 mL/min — ABNORMAL LOW (ref >60)
Glucose, Bld: 75 mg/dL (ref 70–99)
Potassium: 3.8 mmol/L (ref 3.5–5.1)
Sodium: 146 mmol/L — ABNORMAL HIGH (ref 135–145)

## 2019-11-27 LAB — CBC
HCT: 27 % — ABNORMAL LOW (ref 36.0–46.0)
Hemoglobin: 7.4 g/dL — ABNORMAL LOW (ref 12.0–15.0)
MCH: 29.6 pg (ref 26.0–34.0)
MCHC: 27.4 g/dL — ABNORMAL LOW (ref 30.0–36.0)
MCV: 108 fL — ABNORMAL HIGH (ref 80.0–100.0)
Platelets: 120 10*3/uL — ABNORMAL LOW (ref 150–400)
RBC: 2.5 MIL/uL — ABNORMAL LOW (ref 3.87–5.11)
RDW: 17.2 % — ABNORMAL HIGH (ref 11.5–15.5)
WBC: 2.5 10*3/uL — ABNORMAL LOW (ref 4.0–10.5)
nRBC: 0.8 % — ABNORMAL HIGH (ref 0.0–0.2)

## 2019-11-27 MED ORDER — TORSEMIDE 20 MG PO TABS
20.0000 mg | ORAL_TABLET | Freq: Every day | ORAL | Status: DC
  Administered 2019-11-28 – 2019-12-01 (×4): 20 mg via ORAL
  Filled 2019-11-27 (×4): qty 1

## 2019-11-27 MED ORDER — METOLAZONE 5 MG PO TABS
5.0000 mg | ORAL_TABLET | Freq: Once | ORAL | Status: AC
  Administered 2019-11-27: 5 mg via ORAL
  Filled 2019-11-27: qty 1
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
## 2019-11-27 NOTE — Progress Notes (Signed)
Pt's BP low, 93/36, 89/37 respectively. Pt currently sleeping but rouseable to verbal stimuli. On call provider paged, awaiting call back.

## 2019-11-27 NOTE — Progress Notes (Signed)
Informed pt that we would not check pulse Ox continuously since she would not wear a C-pap or ventri mask. Educated her to consciously breathe through nose and practice deep breathing. Pt verbalized understanding.  Hulon Ferron

## 2019-11-27 NOTE — Progress Notes (Signed)
Dr Hochrein's note reviewed. Telemetry reviewed, some intermittent afib episodes. Not able to anticoag due to anemia. Short transient episodes of afib, with soft bp's would not start av nodal agent today, likely would start dilt when able to due to her COPD. Fluctuations in renal function, she is on torsemide 5m bid, continue for now, if worsening function would have to come off   JCarlyle DollyMD

## 2019-11-27 NOTE — Progress Notes (Signed)
PROGRESS NOTE  Jamie Montoya EXB:[id number] DOB: 1946-05-26   PCP: System, Pcp Not In  Patient is from: Home.  Lives alone.  Reports 24/7 care.  Wheelchair-bound.  DOA: 11/24/2019 LOS: 2  Brief Narrative / Interim history: 74 year old female with history of COPD/RF on 2 L, diastolic CHF, SVT, PVD, HTN, UC, chronic diarrhea, hypothyroidism, CKD-3 and debility presenting with progressive shortness of breath, edema,, productive cough, palpitation and chills.   She was admitted for acute on chronic respiratory failure due to COPD exacerbation.  Also found to be anemic to 7.1 (baseline~9).  BNP 500 (previously about 2000).  Troponin negative x2.  The next day, Hgb dropped to 6.7.  Lovenox discontinued.  Transfused 1 unit.  GI consulted.  Hemoglobin appropriately improved to 8.1 >7.4.  S/p dose of IV Lasix 6/4.  Creatinine increased.  Torsemide held.  Due to tenuous respiratory status for the last 2 nights, consulted PCCM.  C. difficile positive and on oral vancomycin, diarrhea improving.  Subjective: Overnight events noted.  Ongoing hypoxia/mouth breathing/placed on Ventimask.  Unable to tolerate CPAP or BiPAP.  Diarrhea improved-3 times yesterday and one episode overnight, mushy stools.  No chest pain or abdominal pain.  Dyspnea improved but breathing not to baseline.  Objective: Vitals:   11/27/19 1203 11/27/19 1400 11/27/19 1405 11/27/19 1633  BP: (!) 89/45 (!) 96/49  (!) 87/48  Pulse: 94 (!) 108 100   Resp: 20 (!) 26 (!) 21 18  Temp:    (!) 97.5 F (36.4 C)  TempSrc:    Oral  SpO2: 95% 95% 93% 93%  Weight:        Intake/Output Summary (Last 24 hours) at 11/27/2019 1813 Last data filed at 11/27/2019 0830 Gross per 24 hour  Intake 360 ml  Output --  Net 360 ml   Filed Weights   11/25/19 0400 11/26/19 0558 11/27/19 0346  Weight: 116 kg 120 kg 117.1 kg    Examination: Patient was interviewed and examined along with her female RN in room. GENERAL: Elderly female, moderately built  and morbidly obese lying comfortably propped up in bed without distress.  Overall looks better than she did yesterday. NECK: Supple.  No apparent JVD but difficult exam.  RESP: Slightly diminished breath sounds in the bases but otherwise clear to auscultation without wheezing or rhonchi or crackles.  No increased work of breathing.  Wanted to come off the Ventimask to nasal cannula oxygen CVS: S1 and S2 heard, RRR.  No murmurs.  Bilateral lower extremity chronic blistering edema.  Telemetry personally reviewed: Sinus rhythm.  Had brief episode of A. fib with RVR in the 100s for 2 hours early this morning ABD/GI/GU: BS+. Abd soft, NTND.  MSK/EXT:  Moves extremities.  Chronic venous insufficiency. SKIN: no apparent skin lesion or wound NEURO: Alert and oriented x3.  No apparent focal neuro deficit. PSYCH: Calm. Normal affect.   Procedures:  None  Microbiology summarized: COVID-19 PCR negative.  Assessment & Plan: Acute on chronic respiratory failure with hypoxia, likely due to COPD exacerbation and symptomatic anemia. -Treat treatable etiologies as below. -Wean oxygen as able -Incentive spirometry -Due to tenuous respiratory status over the last 2 days, consulted PCCM, their input appreciated.  They suspect a combination of OSA/OHS/COPD with associated marked nocturnal desaturations.  They recommend mobilizing, incentive spirometry, outpatient CPAP versus BiPAP evaluation (inpatient trial likely to have poor patient compliance).  They recommend oxygen 3 L/min during the day and 5 L at night, DC continuous pulse ox and trial  of metolazone.  Continue LABA/LAMA/ICS.  Needs outpatient sleep study.  Symptomatic iron deficiency anemia: Baseline Hgb ~9.0> 7.1 (admit)> 6.7>1u>>>.  Iron sat 8%.  She denies melena or hematochezia but history of UC. -Transfuse 1 unit with IV Lasix. -FOBT and GI consult-input appreciated. -Discontinue Lovenox.  SCD for VT prophylaxis. -Hemoglobin up to 8 which is again  dropped to 7.4 in the absence of overt bleeding.  Follow CBC in a.m. and transfuse if hemoglobin 7 g or less.  COPD exacerbation: has cardinal symptoms but multiple confounders. -Pulmicort, Incruse Ellipta, Brovana, doxycycline and as needed DuoNeb. -Patient is hesitant about systemic steroids.  Acute exacerbation seems to have resolved.  Acute on chronic diastolic CHF:  - IV Lasix 40 mg x 1 dose.  Cardiology resuming home dose of oral diuretic -Monitor fluid status and renal function.  Due to increased creatinine, had held today's dose of torsemide but PCCM giving metolazone to address worsening hypernatremia  New onset paroxysmal A. fib.  Paroxysmal SVT: TSHwithinnormal. -Per cardiology: Unable to anticoagulate with significant anemia.  Also unable to initiate AV nodal agents due to ongoing soft blood pressures  AKI on CKD-3A/azotemia: Baseline Cr ~1.6> 1.77 (admit)> 2.0 -Avoid nephrotoxic meds -Creatinine up from 1.7-2.11.  Follow BMP in a.m.  Ulcerative colitis with chronic diarrhea-no clinical signs of infection -GI following.  C. difficile PCR positive.  Oral vancomycin initiated by GI.  Diarrhea better.  DC Imodium.  Essential hypertension: Normotensive.  Other meds at home. -Continue monitoring.  Ongoing soft blood pressures.  Hypothyroidism: TSH within normal -Continue home Synthroid  Adnexal mass with endometrioma history of thickening 1.7 cm: Noted on CT abdomen pelvis on 11/10/2019.  Increased size. -Involve GYN.  Not sure if she is able to tolerate TVUS.  Debility-wheelchair-bound at baseline.  Reports 24/7 home care -PT/OT          DVT prophylaxis: SCD Code Status: Full code Family Communication: Patient and/or RN. Available if any question.  Status is: Inpatient  Remains inpatient appropriate because:Ongoing diagnostic testing needed not appropriate for outpatient work up and Inpatient level of care appropriate due to severity of illness.  Symptomatic anemia  requiring blood transfusion and COPD exacerbation   Dispo: The patient is from: Home              Anticipated d/c is to: Home              Anticipated d/c date is: 2 days              Patient currently is not medically stable to d/c.       Consultants:  GI Cardiology   Sch Meds:  Scheduled Meds: . arformoterol  15 mcg Nebulization BID  . budesonide (PULMICORT) nebulizer solution  0.5 mg Nebulization BID  . doxycycline  100 mg Oral Q12H  . ferrous sulfate  325 mg Oral Q breakfast  . levothyroxine  175 mcg Oral Daily  . loperamide  4 mg Oral QAC breakfast  . sodium chloride flush  3 mL Intravenous Once  . [START ON 11/28/2019] torsemide  20 mg Oral Daily  . umeclidinium bromide  1 puff Inhalation Daily  . vancomycin  125 mg Oral QID   Continuous Infusions:  PRN Meds:.ipratropium-albuterol  Antimicrobials: Anti-infectives (From admission, onward)   Start     Dose/Rate Route Frequency Ordered Stop   11/26/19 1400  vancomycin (VANCOCIN) 50 mg/mL oral solution 125 mg     125 mg Oral 4 times daily 11/26/19 1300 12/06/19  1359   11/25/19 1000  doxycycline (VIBRA-TABS) tablet 100 mg     100 mg Oral Every 12 hours 11/25/19 0026 11/30/19 0959   11/24/19 2245  azithromycin (ZITHROMAX) 500 mg in sodium chloride 0.9 % 250 mL IVPB     500 mg 250 mL/hr over 60 Minutes Intravenous  Once 11/24/19 2232 11/25/19 0443       I have personally reviewed the following labs and images: CBC: Recent Labs  Lab 11/24/19 1739 11/25/19 0622 11/25/19 2206 11/25/19 2359 11/27/19 0437  WBC 4.1 4.0  --  4.3 2.5*  HGB 7.1* 6.7* 8.1* 8.0* 7.4*  HCT 26.2* 24.5* 28.4* 28.2* 27.0*  MCV 111.0* 109.9*  --  105.6* 108.0*  PLT 129* 124*  --  135* 120*   BMP &GFR Recent Labs  Lab 11/24/19 1739 11/25/19 0622 11/26/19 0355 11/27/19 0437  NA 145 145 142 146*  K 4.2 4.1 4.9 3.8  CL 98 99 99 99  CO2 36* 37* 33* 37*  GLUCOSE 82 85 112* 75  BUN 44* 46* 46* 47*  CREATININE 1.77* 2.00* 1.77* 2.11*   CALCIUM 7.6* 7.3* 6.9* 7.2*  MG  --  1.8 1.8  --   PHOS  --  4.2 4.5  --    Estimated Creatinine Clearance: 29 mL/min (A) (by C-G formula based on SCr of 2.11 mg/dL (H)). Liver & Pancreas: Recent Labs  Lab 11/25/19 0622 11/26/19 0355  ALBUMIN 2.8* 2.8*   No results for input(s): LIPASE, AMYLASE in the last 168 hours. No results for input(s): AMMONIA in the last 168 hours. Diabetic: No results for input(s): HGBA1C in the last 72 hours. Recent Labs  Lab 11/25/19 1607  GLUCAP 84   Cardiac Enzymes: No results for input(s): CKTOTAL, CKMB, CKMBINDEX, TROPONINI in the last 168 hours. Recent Labs    09/29/19 1616 10/22/19 1220  PROBNP 1,266* 2,944*   Coagulation Profile: No results for input(s): INR, PROTIME in the last 168 hours. Thyroid Function Tests: Recent Labs    11/25/19 0622  TSH 1.081   Lipid Profile: No results for input(s): CHOL, HDL, LDLCALC, TRIG, CHOLHDL, LDLDIRECT in the last 72 hours. Anemia Panel: Recent Labs    11/25/19 0622  VITAMINB12 727  FOLATE 12.9  FERRITIN 93  TIBC 284  IRON 22*  RETICCTPCT 4.2*   Urine analysis: No results found for: COLORURINE, APPEARANCEUR, LABSPEC, PHURINE, GLUCOSEU, HGBUR, BILIRUBINUR, KETONESUR, PROTEINUR, UROBILINOGEN, NITRITE, LEUKOCYTESUR Sepsis Labs: Invalid input(s): PROCALCITONIN, [place]  Microbiology: Recent Results (from the past 240 hour(s))  SARS Coronavirus 2 by RT PCR (hospital order, performed in [facility] hospital lab) Nasopharyngeal Nasopharyngeal Swab     Status: None   Collection Time: 11/24/19 11:50 PM   Specimen: Nasopharyngeal Swab  Result Value Ref Range Status   SARS Coronavirus 2 NEGATIVE NEGATIVE Final    Comment: (NOTE) SARS-CoV-2 target nucleic acids are NOT DETECTED. The SARS-CoV-2 RNA is generally detectable in upper and lower respiratory specimens during the acute phase of infection. The lowest concentration of SARS-CoV-2 viral copies this assay can detect is 250 copies /  mL. A negative result does not preclude SARS-CoV-2 infection and should not be used as the sole basis for treatment or other patient management decisions.  A negative result may occur with improper specimen collection / handling, submission of specimen other than nasopharyngeal swab, presence of viral mutation(s) within the areas targeted by this assay, and inadequate number of viral copies (<250 copies / mL). A negative result must be combined with clinical  observations, patient history, and epidemiological information. Fact Sheet for Patients:   StrictlyIdeas.no Fact Sheet for Healthcare Providers: BankingDealers.co.za This test is not yet approved or cleared  by the [place] FDA and has been authorized for detection and/or diagnosis of SARS-CoV-2 by FDA under an Emergency Use Authorization (EUA).  This EUA will remain in effect (meaning this test can be used) for the duration of the COVID-19 declaration under Section 564(b)(1) of the Act, 21 U.S.C. section 360bbb-3(b)(1), unless the authorization is terminated or revoked sooner. Performed at [facility], [place] [address] [place], [place] [address]   C Difficile Quick Screen w PCR reflex     Status: Abnormal   Collection Time: 11/25/19 11:59 PM   Specimen: STOOL  Result Value Ref Range Status   C Diff antigen POSITIVE (A) NEGATIVE Final   C Diff toxin NEGATIVE NEGATIVE Final   C Diff interpretation Results are indeterminate. See PCR results.  Final    Comment: Performed at [facility], [place] [address], [place], [place] [address]  C. Diff by PCR, Reflexed     Status: Abnormal   Collection Time: 11/25/19 11:59 PM  Result Value Ref Range Status   Toxigenic C. Difficile by PCR POSITIVE (A) NEGATIVE Final    Comment: Positive for toxigenic C. difficile with little to no toxin production. Only treat if clinical presentation suggests symptomatic illness. Performed at [facility], [place] [address] [place], [place] [address]     Radiology Studies: No results found.    Vernell Leep, MD, [place], [facility]. [facility]  To contact the attending provider between 7A-7P or the covering provider during after hours 7P-7A, please log into the web site [email or web address] and access using universal [place] password for that web site. If you do not have the password, please call the hospital operator.

## 2019-11-28 LAB — BASIC METABOLIC PANEL
Anion gap: 13 (ref 5–15)
BUN: 56 mg/dL — ABNORMAL HIGH (ref 8–23)
CO2: 32 mmol/L (ref 22–32)
Calcium: 7.4 mg/dL — ABNORMAL LOW (ref 8.9–10.3)
Chloride: 97 mmol/L — ABNORMAL LOW (ref 98–111)
Creatinine, Ser: 2.07 mg/dL — ABNORMAL HIGH (ref 0.44–1.00)
GFR calc Af Amer: 26 mL/min — ABNORMAL LOW (ref >60)
GFR calc non Af Amer: 23 mL/min — ABNORMAL LOW (ref >60)
Glucose, Bld: 80 mg/dL (ref 70–99)
Potassium: 4.1 mmol/L (ref 3.5–5.1)
Sodium: 142 mmol/L (ref 135–145)

## 2019-11-28 LAB — CBC
HCT: 30.2 % — ABNORMAL LOW (ref 36.0–46.0)
Hemoglobin: 8.3 g/dL — ABNORMAL LOW (ref 12.0–15.0)
MCH: 29.5 pg (ref 26.0–34.0)
MCHC: 27.5 g/dL — ABNORMAL LOW (ref 30.0–36.0)
MCV: 107.5 fL — ABNORMAL HIGH (ref 80.0–100.0)
Platelets: 139 10*3/uL — ABNORMAL LOW (ref 150–400)
RBC: 2.81 MIL/uL — ABNORMAL LOW (ref 3.87–5.11)
RDW: 16.4 % — ABNORMAL HIGH (ref 11.5–15.5)
WBC: 3.7 10*3/uL — ABNORMAL LOW (ref 4.0–10.5)
nRBC: 0.8 % — ABNORMAL HIGH (ref 0.0–0.2)

## 2019-11-28 NOTE — Progress Notes (Signed)
PROGRESS NOTE  Jamie Montoya FFM:[id number] DOB: 02/29/1946   PCP: System, Pcp Not In  Patient is from: Home.  Lives alone.  Reports 24/7 care.  Wheelchair-bound.  DOA: 11/24/2019 LOS: 3  Brief Narrative / Interim history: 74 year old female with history of COPD/RF on 2 L, diastolic CHF, SVT, PVD, HTN, UC, chronic diarrhea, hypothyroidism, CKD-3 and debility presenting with progressive shortness of breath, edema,, productive cough, palpitation and chills.   She was admitted for acute on chronic respiratory failure due to COPD exacerbation.  Also found to be anemic to 7.1 (baseline~9).  BNP 500 (previously about 2000).  Troponin negative x2.  The next day, Hgb dropped to 6.7.  Lovenox discontinued.  Transfused 1 unit.  GI consulted.  Hemoglobin appropriately improved to 8.1 >7.4 >8.3.  S/p dose of IV Lasix 6/4.  Creatinine increased.  Torsemide held.  Due to tenuous respiratory status for the last 2 nights, consulted PCCM.  C. difficile positive and on oral vancomycin, diarrhea improving.  Subjective: Reports that she slept well last night.  Dyspnea improved but unable to say if it is back to baseline.  No chest pain.  Reports occasional intermittent palpitations.  Still having diarrhea but less compared to at home.  As per RN, no acute issues noted.  Objective: Vitals:   11/28/19 0447 11/28/19 0802 11/28/19 0827 11/28/19 1600  BP: (!) 124/55 (!) 98/51  (!) 117/40  Pulse: 90 78  75  Resp: 19 18  18   Temp: 97.6 F (36.4 C) (!) 97.4 F (36.3 C)  (!) 97.4 F (36.3 C)  TempSrc: Oral Oral  Oral  SpO2: 99%  100% 98%  Weight: 118.4 kg       Intake/Output Summary (Last 24 hours) at 11/28/2019 1734 Last data filed at 11/28/2019 1613 Gross per 24 hour  Intake 577 ml  Output 300 ml  Net 277 ml   Filed Weights   11/26/19 0558 11/27/19 0346 11/28/19 0447  Weight: 120 kg 117.1 kg 118.4 kg    Examination:  GENERAL: Elderly female, moderately built and morbidly obese lying comfortably  propped up in bed without distress.  NECK: Supple.  No apparent JVD but difficult exam.  RESP: Clear to auscultation.  No increased work of breathing.  Able to speak in full sentences. CVS: S1 and S2 heard, RRR.  No murmurs.  Bilateral lower extremity chronic blistering edema-does not seem to be much of acute edema.  Telemetry personally reviewed: Mostly in sinus rhythm occasional transient bursts of A. fib with RVR which may be causing her palpitations. ABD/GI/GU: BS+. Abd soft, NTND.  No organomegaly or masses appreciated. MSK/EXT:  Moves extremities.  Chronic venous insufficiency. SKIN: no apparent skin lesion or wound NEURO: Alert and oriented x3.  No apparent focal neuro deficit. PSYCH: Calm. Normal affect.   Procedures:  None  Microbiology summarized: COVID-19 PCR negative.  Assessment & Plan: Acute on chronic respiratory failure with hypoxia, likely due to COPD exacerbation and symptomatic anemia. -Treated treatable etiologies as below. -Wean oxygen as able, now back on her home regimen of 3 L/min. -Incentive spirometry -Due to tenuous respiratory status over the last 2 days, consulted PCCM 6/5, their input appreciated.  They suspect a combination of OSA/OHS/COPD with associated marked nocturnal desaturations.  They recommend mobilizing, incentive spirometry, outpatient CPAP versus BiPAP evaluation (inpatient trial likely to have poor patient compliance).  They recommend oxygen 3 L/min during the day and 5 L at night, DC continuous pulse ox and trial of metolazone.  Continue  LABA/LAMA/ICS.  Needs outpatient sleep study. Improved and stable.  Symptomatic iron deficiency anemia: Baseline Hgb ~9.0> 7.1 (admit)> 6.7>1u>>>.  Iron sat 8%.  She denies melena or hematochezia but history of UC. -Transfuse 1 unit with IV Lasix. -FOBT and GI consult-input appreciated. -Discontinue Lovenox.  SCD for VT prophylaxis. -Yesterday's hemoglobin of 7.4 was probably an error.  Hemoglobin 8.3 today.   Follow CBC in a.m.  COPD exacerbation: has cardinal symptoms but multiple confounders. -Pulmicort, Incruse Ellipta, Brovana, doxycycline and as needed DuoNeb. -Patient is hesitant about systemic steroids.  Acute exacerbation seems to have resolved.  Acute on chronic diastolic CHF:  - IV Lasix 40 mg x 1 dose.  -Volume status difficult to assess due to body habitus.  S/p a dose of metolazone 6/5.  Now on torsemide 20 daily, continue.  Creatinine stable.  New onset paroxysmal A. fib.  Paroxysmal SVT: TSHwithinnormal. -Per cardiology: Unable to anticoagulate with significant anemia.  Also unable to initiate AV nodal agents due to ongoing soft blood pressures  AKI on CKD-3A/azotemia: Baseline Cr ~1.6> 1.77 (admit)> 2.0 -Avoid nephrotoxic meds -Creatinine up from 1.7-2.11 >2.07  Ulcerative colitis with chronic diarrhea-no clinical signs of infection -GI following.  C. difficile PCR positive.  Oral vancomycin initiated by GI.  Diarrhea better.  DC Imodium.  Essential hypertension: -Continue monitoring.  Ongoing soft blood pressures.  Hypothyroidism: TSH within normal -Continue home Synthroid  Adnexal mass with endometrioma history of thickening 1.7 cm: Noted on CT abdomen pelvis on 11/10/2019.  Increased size. -Involve GYN.  Not sure if she is able to tolerate TVUS.  Debility-wheelchair-bound at baseline.  Reports 24/7 home care -PT/OT          DVT prophylaxis: SCD Code Status: Full code Family Communication: Patient and/or RN. Available if any question.  Status is: Inpatient  Remains inpatient appropriate because:Ongoing diagnostic testing needed not appropriate for outpatient work up and Inpatient level of care appropriate due to severity of illness.  Symptomatic anemia requiring blood transfusion and COPD exacerbation   Dispo: The patient is from: Home              Anticipated d/c is to: Home              Anticipated d/c date is: 2 days.  Reassess in a.m. for potential  discharge.              Patient currently is not medically stable to d/c.       Consultants:  GI Cardiology   Sch Meds:  Scheduled Meds: . arformoterol  15 mcg Nebulization BID  . budesonide (PULMICORT) nebulizer solution  0.5 mg Nebulization BID  . doxycycline  100 mg Oral Q12H  . ferrous sulfate  325 mg Oral Q breakfast  . levothyroxine  175 mcg Oral Daily  . sodium chloride flush  3 mL Intravenous Once  . torsemide  20 mg Oral Daily  . umeclidinium bromide  1 puff Inhalation Daily  . vancomycin  125 mg Oral QID   Continuous Infusions:  PRN Meds:.ipratropium-albuterol  Antimicrobials: Anti-infectives (From admission, onward)   Start     Dose/Rate Route Frequency Ordered Stop   11/26/19 1400  vancomycin (VANCOCIN) 50 mg/mL oral solution 125 mg     125 mg Oral 4 times daily 11/26/19 1300 12/06/19 1359   11/25/19 1000  doxycycline (VIBRA-TABS) tablet 100 mg     100 mg Oral Every 12 hours 11/25/19 0026 11/30/19 0959   11/24/19 2245  azithromycin (ZITHROMAX) 500 mg  in sodium chloride 0.9 % 250 mL IVPB     500 mg 250 mL/hr over 60 Minutes Intravenous  Once 11/24/19 2232 11/25/19 0443       I have personally reviewed the following labs and images: CBC: Recent Labs  Lab 11/24/19 1739 11/24/19 1739 11/25/19 0622 11/25/19 2206 11/25/19 2359 11/27/19 0437 11/28/19 0024  WBC 4.1  --  4.0  --  4.3 2.5* 3.7*  HGB 7.1*   < > 6.7* 8.1* 8.0* 7.4* 8.3*  HCT 26.2*   < > 24.5* 28.4* 28.2* 27.0* 30.2*  MCV 111.0*  --  109.9*  --  105.6* 108.0* 107.5*  PLT 129*  --  124*  --  135* 120* 139*   < > = values in this interval not displayed.   BMP &GFR Recent Labs  Lab 11/24/19 1739 11/25/19 0622 11/26/19 0355 11/27/19 0437 11/28/19 0024  NA 145 145 142 146* 142  K 4.2 4.1 4.9 3.8 4.1  CL 98 99 99 99 97*  CO2 36* 37* 33* 37* 32  GLUCOSE 82 85 112* 75 80  BUN 44* 46* 46* 47* 56*  CREATININE 1.77* 2.00* 1.77* 2.11* 2.07*  CALCIUM 7.6* 7.3* 6.9* 7.2* 7.4*  MG  --  1.8  1.8  --   --   PHOS  --  4.2 4.5  --   --    Estimated Creatinine Clearance: 29.7 mL/min (A) (by C-G formula based on SCr of 2.07 mg/dL (H)). Liver & Pancreas: Recent Labs  Lab 11/25/19 0622 11/26/19 0355  ALBUMIN 2.8* 2.8*   No results for input(s): LIPASE, AMYLASE in the last 168 hours. No results for input(s): AMMONIA in the last 168 hours. Diabetic: No results for input(s): HGBA1C in the last 72 hours. Recent Labs  Lab 11/25/19 1607  GLUCAP 84   Cardiac Enzymes: No results for input(s): CKTOTAL, CKMB, CKMBINDEX, TROPONINI in the last 168 hours. Recent Labs    09/29/19 1616 10/22/19 1220  PROBNP 1,266* 2,944*   Coagulation Profile: No results for input(s): INR, PROTIME in the last 168 hours. Thyroid Function Tests: No results for input(s): TSH, T4TOTAL, FREET4, T3FREE, THYROIDAB in the last 72 hours. Lipid Profile: No results for input(s): CHOL, HDL, LDLCALC, TRIG, CHOLHDL, LDLDIRECT in the last 72 hours. Anemia Panel: No results for input(s): VITAMINB12, FOLATE, FERRITIN, TIBC, IRON, RETICCTPCT in the last 72 hours. Urine analysis: No results found for: COLORURINE, APPEARANCEUR, LABSPEC, PHURINE, GLUCOSEU, HGBUR, BILIRUBINUR, KETONESUR, PROTEINUR, UROBILINOGEN, NITRITE, LEUKOCYTESUR Sepsis Labs: Invalid input(s): PROCALCITONIN, [place]  Microbiology: Recent Results (from the past 240 hour(s))  SARS Coronavirus 2 by RT PCR (hospital order, performed in [facility] hospital lab) Nasopharyngeal Nasopharyngeal Swab     Status: None   Collection Time: 11/24/19 11:50 PM   Specimen: Nasopharyngeal Swab  Result Value Ref Range Status   SARS Coronavirus 2 NEGATIVE NEGATIVE Final    Comment: (NOTE) SARS-CoV-2 target nucleic acids are NOT DETECTED. The SARS-CoV-2 RNA is generally detectable in upper and lower respiratory specimens during the acute phase of infection. The lowest concentration of SARS-CoV-2 viral copies this assay can detect is 250 copies / mL. A  negative result does not preclude SARS-CoV-2 infection and should not be used as the sole basis for treatment or other patient management decisions.  A negative result may occur with improper specimen collection / handling, submission of specimen other than nasopharyngeal swab, presence of viral mutation(s) within the areas targeted by this assay, and inadequate number of viral copies (<250 copies /  mL). A negative result must be combined with clinical observations, patient history, and epidemiological information. Fact Sheet for Patients:   StrictlyIdeas.no Fact Sheet for Healthcare Providers: BankingDealers.co.za This test is not yet approved or cleared  by the [place] FDA and has been authorized for detection and/or diagnosis of SARS-CoV-2 by FDA under an Emergency Use Authorization (EUA).  This EUA will remain in effect (meaning this test can be used) for the duration of the COVID-19 declaration under Section 564(b)(1) of the Act, 21 U.S.C. section 360bbb-3(b)(1), unless the authorization is terminated or revoked sooner. Performed at [facility], [place] [address], [place], [place] [address]   C Difficile Quick Screen w PCR reflex     Status: Abnormal   Collection Time: 11/25/19 11:59 PM   Specimen: STOOL  Result Value Ref Range Status   C Diff antigen POSITIVE (A) NEGATIVE Final   C Diff toxin NEGATIVE NEGATIVE Final   C Diff interpretation Results are indeterminate. See PCR results.  Final    Comment: Performed at [facility], [place] [address], [place], [place] [address]  C. Diff by PCR, Reflexed     Status: Abnormal   Collection Time: 11/25/19 11:59 PM  Result Value Ref Range Status   Toxigenic C. Difficile by PCR POSITIVE (A) NEGATIVE Final    Comment: Positive for toxigenic C. difficile with little to no toxin production. Only treat if clinical presentation suggests symptomatic illness. Performed at [facility], [place] [address] [place], [place] [address]     Radiology Studies: No results found.    Vernell Leep, MD, [place], [facility]. [facility]  To contact the attending provider between 7A-7P or the covering provider during after hours 7P-7A, please log into the web site [email or web address] and access using universal [place] password for that web site. If you do not have the password, please call the hospital operator.

## 2019-11-28 NOTE — Progress Notes (Signed)
Progress Note  Patient Name: Jamie Montoya Date of Encounter: 11/28/2019  [facility] Cardiologist: Ena Dawley, MD   Subjective   Some ongoing SOB  Inpatient Medications    Scheduled Meds: . arformoterol  15 mcg Nebulization BID  . budesonide (PULMICORT) nebulizer solution  0.5 mg Nebulization BID  . doxycycline  100 mg Oral Q12H  . ferrous sulfate  325 mg Oral Q breakfast  . levothyroxine  175 mcg Oral Daily  . sodium chloride flush  3 mL Intravenous Once  . torsemide  20 mg Oral Daily  . umeclidinium bromide  1 puff Inhalation Daily  . vancomycin  125 mg Oral QID   Continuous Infusions:  PRN Meds: ipratropium-albuterol   Vital Signs    Vitals:   11/28/19 0029 11/28/19 0447 11/28/19 0802 11/28/19 0827  BP: (!) 106/42 (!) 124/55 (!) 98/51   Pulse: 65 90 78   Resp: 17 19 18    Temp: 97.7 F (36.5 C) 97.6 F (36.4 C) (!) 97.4 F (36.3 C)   TempSrc: Oral Oral Oral   SpO2: 100% 99%  100%  Weight:  118.4 kg      Intake/Output Summary (Last 24 hours) at 11/28/2019 1131 Last data filed at 11/27/2019 2030 Gross per 24 hour  Intake 477 ml  Output 100 ml  Net 377 ml   Last 3 Weights 11/28/2019 11/27/2019 11/26/2019  Weight (lbs) 261 lb 1.6 oz 258 lb 2.5 oz 264 lb 8.8 oz  Weight (kg) 118.434 kg 117.1 kg 120 kg      Telemetry    SR, short runs afib - Personally Reviewed  ECG    n/a - Personally Reviewed  Physical Exam   GEN: No acute distress.   Neck: No JVD Cardiac: RRR, no murmurs, rubs, or gallops.  Respiratory: Clear to auscultation bilaterally. GI: Soft, nontender, non-distended  MS: No edema; No deformity. Neuro:  Nonfocal  Psych: Normal affect   Labs    High Sensitivity Troponin:   Recent Labs  Lab 11/24/19 1739 11/24/19 2020  TROPONINIHS 17 14      Chemistry Recent Labs  Lab 11/25/19 0622 11/25/19 0622 11/26/19 0355 11/27/19 0437 11/28/19 0024  NA 145   < > 142 146* 142  K 4.1   < > 4.9 3.8 4.1  CL 99   < > 99 99 97*  CO2 37*    < > 33* 37* 32  GLUCOSE 85   < > 112* 75 80  BUN 46*   < > 46* 47* 56*  CREATININE 2.00*   < > 1.77* 2.11* 2.07*  CALCIUM 7.3*   < > 6.9* 7.2* 7.4*  ALBUMIN 2.8*  --  2.8*  --   --   GFRNONAA 24*   < > 28* 22* 23*  GFRAA 28*   < > 32* 26* 26*  ANIONGAP 9   < > 10 10 13    < > = values in this interval not displayed.     Hematology Recent Labs  Lab 11/25/19 2359 11/27/19 0437 11/28/19 0024  WBC 4.3 2.5* 3.7*  RBC 2.67* 2.50* 2.81*  HGB 8.0* 7.4* 8.3*  HCT 28.2* 27.0* 30.2*  MCV 105.6* 108.0* 107.5*  MCH 30.0 29.6 29.5  MCHC 28.4* 27.4* 27.5*  RDW 17.7* 17.2* 16.4*  PLT 135* 120* 139*    BNP Recent Labs  Lab 11/24/19 1739  BNP 589.6*     DDimer No results for input(s): DDIMER in the last 168 hours.   Radiology  No results found.  Cardiac Studies    Patient Profile     74 y.o. female with a hx of HFpEF, Crohn's,wheelchair bound, PSVT, COPD with chronic respiratory failure on home O2, RBBB, hypothyroidism, HLD, CKD stage 3,chronic leg edema, esophageal stricture and possible malignancywho is being seen for the evaluation of shortness of breath.  Assessment & Plan    1. New diagnosis PAF - not on anticoag due to anemia - soft bp's have, not started av nodal agent. Has been paroxysmal with just short self terminating episodes. I dont think afib burden significant enough to start antiarrhythmic, limited candidate given her lung disease, diastolic HF, renal dysfunction. Hopefully bp's will allow av nodal agent, fortunately episodes are infrequent and short in duration thus far   2. SOB - multifactorial,seen by pulmonary. Combiation OSA/OHS/COPD - some component of diastolic HF though likely lesser role, she is on oral torsemide at this time. Volumue status difficult to assess due to body habitus, repeat BNP with tomorrows labs.   3. Anemia - per primary team  For questions or updates, please contact [place] Please consult [email or web address] for contact  info under        Signed, Carlyle Dolly, MD  11/28/2019, 11:31 AM

## 2019-11-29 DIAGNOSIS — R197 Diarrhea, unspecified: Secondary | ICD-10-CM

## 2019-11-29 DIAGNOSIS — N9489 Other specified conditions associated with female genital organs and menstrual cycle: Secondary | ICD-10-CM

## 2019-11-29 DIAGNOSIS — J441 Chronic obstructive pulmonary disease with (acute) exacerbation: Principal | ICD-10-CM

## 2019-11-29 LAB — CORTISOL: Cortisol, Plasma: 14.1 ug/dL

## 2019-11-29 LAB — BASIC METABOLIC PANEL
Anion gap: 7 (ref 5–15)
BUN: 50 mg/dL — ABNORMAL HIGH (ref 8–23)
CO2: 39 mmol/L — ABNORMAL HIGH (ref 22–32)
Calcium: 7.6 mg/dL — ABNORMAL LOW (ref 8.9–10.3)
Chloride: 99 mmol/L (ref 98–111)
Creatinine, Ser: 1.84 mg/dL — ABNORMAL HIGH (ref 0.44–1.00)
GFR calc Af Amer: 31 mL/min — ABNORMAL LOW (ref >60)
GFR calc non Af Amer: 26 mL/min — ABNORMAL LOW (ref >60)
Glucose, Bld: 77 mg/dL (ref 70–99)
Potassium: 4.2 mmol/L (ref 3.5–5.1)
Sodium: 145 mmol/L (ref 135–145)

## 2019-11-29 LAB — CBC
HCT: 31 % — ABNORMAL LOW (ref 36.0–46.0)
Hemoglobin: 8.5 g/dL — ABNORMAL LOW (ref 12.0–15.0)
MCH: 29.8 pg (ref 26.0–34.0)
MCHC: 27.4 g/dL — ABNORMAL LOW (ref 30.0–36.0)
MCV: 108.8 fL — ABNORMAL HIGH (ref 80.0–100.0)
Platelets: 146 10*3/uL — ABNORMAL LOW (ref 150–400)
RBC: 2.85 MIL/uL — ABNORMAL LOW (ref 3.87–5.11)
RDW: 16.5 % — ABNORMAL HIGH (ref 11.5–15.5)
WBC: 3.9 10*3/uL — ABNORMAL LOW (ref 4.0–10.5)
nRBC: 0.8 % — ABNORMAL HIGH (ref 0.0–0.2)

## 2019-11-29 LAB — BRAIN NATRIURETIC PEPTIDE: B Natriuretic Peptide: 967.9 pg/mL — ABNORMAL HIGH (ref 0.0–100.0)

## 2019-11-29 NOTE — Progress Notes (Signed)
Rectal temp 94.7, MD [place] paged. BP 113/59, HR 91, O2 99. Patient is asymptomatic.  Labs and bear hugger ordered.

## 2019-11-29 NOTE — Progress Notes (Signed)
    Progress Note   Subjective  Chief Complaint: Adnexal mass, diarrhea, UC  This morning, patient denies any new pains.  Tells [place] that she "wants to live" and essentially wants anything done that needs to be done in order to make this happen.  Continues with loose stools which have not really changed over the past few days being on Vancomycin.   Objective   Vital signs in last 24 hours: Temp:  [97.2 F (36.2 C)-97.9 F (36.6 C)] 97.2 F (36.2 C) (06/07 0800) Pulse Rate:  [60-124] 79 (06/07 0905) Resp:  [14-23] 17 (06/07 0905) BP: (82-117)/(40-60) 93/45 (06/07 0845) SpO2:  [91 %-100 %] 98 % (06/07 0905) Weight:  [341.9 kg] 116.8 kg (06/07 0207) Last BM Date: 11/28/19 General:    white female in NAD Heart:  Regular rate and rhythm; no murmurs Lungs: Respirations even and unlabored, lungs CTA bilaterally Abdomen:  Soft, nontender and nondistended. Normal bowel sounds. Extremities:  Without edema. Neurologic:  Alert and oriented,  grossly normal neurologically. Psych:  Cooperative. Normal mood and affect.  Intake/Output from previous day: 06/06 0701 - 06/07 0700 In: 340 [P.O.:340] Out: 300 [Urine:300]   Lab Results: Recent Labs    11/27/19 0437 11/28/19 0024 11/29/19 0405  WBC 2.5* 3.7* 3.9*  HGB 7.4* 8.3* 8.5*  HCT 27.0* 30.2* 31.0*  PLT 120* 139* 146*   BMET Recent Labs    11/27/19 0437 11/28/19 0024 11/29/19 0405  NA 146* 142 145  K 3.8 4.1 4.2  CL 99 97* 99  CO2 37* 32 39*  GLUCOSE 75 80 77  BUN 47* 56* 50*  CREATININE 2.11* 2.07* 1.84*  CALCIUM 7.2* 7.4* 7.6*    Assessment / Plan:   Assessment: 1.  Ulcerative colitis: Off treatments for at least several months 2.  C. difficile: Positive in the hospital, has been on Vancomycin 3.  Adnexal mass: Question of invasion into the colon 4.  Chronic supplemental O2: Currently on 3 L  Plan: 1.  Continue current supportive measures 2.  Case was discussed with [facility] as they are following the patient for this  adnexal mass and have plans for colonoscopy and patient has appointments arranged with metabolic etc.  Please see Dr. Vivia Ewing notes for further information. 3.  Please await any further recommendations from Dr. Bryan Lemma but we will likely be signing off.  Thank you for kind consultation.     LOS: 4 days   Levin Erp  11/29/2019, 11:10 AM

## 2019-11-29 NOTE — Progress Notes (Signed)
Progress Note  Patient Name: Jamie Montoya Date of Encounter: 11/29/2019  [facility] Cardiologist: Ena Dawley, MD   Subjective   Breathing is OK   Felt heart fluttering earlier No CP   Inpatient Medications    Scheduled Meds: . arformoterol  15 mcg Nebulization BID  . budesonide (PULMICORT) nebulizer solution  0.5 mg Nebulization BID  . doxycycline  100 mg Oral Q12H  . ferrous sulfate  325 mg Oral Q breakfast  . levothyroxine  175 mcg Oral Daily  . sodium chloride flush  3 mL Intravenous Once  . torsemide  20 mg Oral Daily  . umeclidinium bromide  1 puff Inhalation Daily  . vancomycin  125 mg Oral QID   Continuous Infusions:  PRN Meds: ipratropium-albuterol   Vital Signs    Vitals:   11/28/19 2221 11/29/19 0134 11/29/19 0207 11/29/19 0451  BP: (!) 106/44 (!) 97/58  112/60  Pulse: 60 61  (!) 104  Resp: 18 17  14   Temp:  97.8 F (36.6 C)  97.9 F (36.6 C)  TempSrc:  Oral  Oral  SpO2: 100% 100%  91%  Weight:   116.8 kg     Intake/Output Summary (Last 24 hours) at 11/29/2019 0715 Last data filed at 11/28/2019 1613 Gross per 24 hour  Intake 340 ml  Output 300 ml  Net 40 ml   Last 3 Weights 11/29/2019 11/28/2019 11/27/2019  Weight (lbs) 257 lb 9.6 oz 261 lb 1.6 oz 258 lb 2.5 oz  Weight (kg) 116.847 kg 118.434 kg 117.1 kg      Telemetry    SR plus had 4 hours of afib  (rates 100s) earlier- Personally Reviewed  ECG    n/a - Personally Reviewed  Physical Exam   GEN: No acute distress.   Neck: No JVD Cardiac: RRR, no murmurs Respiratory: Clear to auscultation bilaterally. GI: Soft, nontender, non-distended  MS:  Tr edema; No deformity. Neuro:  Nonfocal    Labs    High Sensitivity Troponin:   Recent Labs  Lab 11/24/19 1739 11/24/19 2020  TROPONINIHS 17 14      Chemistry Recent Labs  Lab 11/25/19 0622 11/25/19 0622 11/26/19 0355 11/26/19 0355 11/27/19 0437 11/28/19 0024 11/29/19 0405  NA 145   < > 142   < > 146* 142 145  K 4.1   < >  4.9   < > 3.8 4.1 4.2  CL 99   < > 99   < > 99 97* 99  CO2 37*   < > 33*   < > 37* 32 39*  GLUCOSE 85   < > 112*   < > 75 80 77  BUN 46*   < > 46*   < > 47* 56* 50*  CREATININE 2.00*   < > 1.77*   < > 2.11* 2.07* 1.84*  CALCIUM 7.3*   < > 6.9*   < > 7.2* 7.4* 7.6*  ALBUMIN 2.8*  --  2.8*  --   --   --   --   GFRNONAA 24*   < > 28*   < > 22* 23* 26*  GFRAA 28*   < > 32*   < > 26* 26* 31*  ANIONGAP 9   < > 10   < > 10 13 7    < > = values in this interval not displayed.     Hematology Recent Labs  Lab 11/27/19 0437 11/28/19 0024 11/29/19 0405  WBC 2.5* 3.7* 3.9*  RBC 2.50* 2.81* 2.85*  HGB 7.4* 8.3* 8.5*  HCT 27.0* 30.2* 31.0*  MCV 108.0* 107.5* 108.8*  MCH 29.6 29.5 29.8  MCHC 27.4* 27.5* 27.4*  RDW 17.2* 16.4* 16.5*  PLT 120* 139* 146*    BNP Recent Labs  Lab 11/24/19 1739 11/29/19 0405  BNP 589.6* 967.9*     DDimer No results for input(s): DDIMER in the last 168 hours.   Radiology    No results found.  Cardiac Studies    Patient Profile     74 y.o. female with a hx of HFpEF, Crohn's,wheelchair bound, PSVT, COPD with chronic respiratory failure on home O2, RBBB, hypothyroidism, HLD, CKD stage 3,chronic leg edema, esophageal stricture and possible malignancywho is being seen for the evaluation of shortness of breath.  Assessment & Plan    1. New diagnosis PAF Had spell for about 4 hours   Unfortunately  -  2. SOB - multifactorial,seen by pulmonary. Combination OSA/OHS/COPD BNP is up some   She does appear to have some increased volume on exam  On oral torsemide  Got am dose   Would not dose again today   3. Anemia -  Hgb was 7.4  Today 8.3  GI following    4  GI   Hx UC    Plan for endoscopy  For questions or updates, please contact [place] Please consult [email or web address] for contact info under        Signed, Dorris Carnes, MD  11/29/2019, 7:15 AM

## 2019-11-29 NOTE — Progress Notes (Addendum)
Physical Therapy Treatment Patient Details Name: Jamie Montoya MRN: [id number] DOB: Jun 08, 1946 Today's Date: 11/29/2019    History of Present Illness 74 y.o. female with a hx of HFpEF, Crohn's, wheelchair bound, PSVT, COPD with chronic respiratory failure on home O2, RBBB, hypothyroidism, HLD, CKD stage 3, chronic leg edema, esophageal stricture and possible malignancy who is being seen for the evaluation of shortness of breath.    PT Comments    Pt supine in bed on entry, asleep. Pt easily roused and agreeable to therapy. Pt has poor understanding of why she is in the hospital, and is unhappy with the amount of physical therapy she is getting. Pt expects to be seen twice a day for progressing her mobility. Pt educated to fact that PT in hospital 3x/week. Pt limited in safe mobility by oxygen desaturation with activity, and stress incontinence with coughing in presence of decreased strength and endurance. Pt requires min guard for bed mobility, transfers and ambulation of 2 bouts of 20 feet with RW. D/c plans remain appropriate. PT will continue to follow acutely.    Follow Up Recommendations  Home health PT;Supervision/Assistance - 24 hour     Equipment Recommendations  None recommended by PT(unless pt can get wider wheelchair and 3N1)       Precautions / Restrictions Precautions Precautions: Fall Restrictions Weight Bearing Restrictions: No    Mobility  Bed Mobility Overal bed mobility: Needs Assistance Bed Mobility: Supine to Sit     Supine to sit: Min guard;HOB elevated     General bed mobility comments: min guard for safety with coming to EoB, HoB elevated and heavy use of bedrail to come to EoB  Transfers Overall transfer level: Needs assistance Equipment used: Rolling walker (2 wheeled) Transfers: Sit to/from Stand Sit to Stand: Min guard         General transfer comment: min guard for safety with sit to stand, able to self steady before performing a stepping  transfer to the recliner on her R  Ambulation/Gait Ambulation/Gait assistance: Min guard;Min assist Gait Distance (Feet): 20 Feet(2x20) Assistive device: Rolling walker (2 wheeled) Gait Pattern/deviations: Step-through pattern;Shuffle;Trunk flexed;Wide base of support Gait velocity: slowed Gait velocity interpretation: <1.8 ft/sec, indicate of risk for recurrent falls General Gait Details: min guard for safety with slow, waddling gait, 1x LOB requiring light min A for steadying         Balance Overall balance assessment: Needs assistance Sitting-balance support: Feet supported;No upper extremity supported Sitting balance-Leahy Scale: Good     Standing balance support: Bilateral upper extremity supported;During functional activity Standing balance-Leahy Scale: Fair                              Cognition Arousal/Alertness: Awake/alert Behavior During Therapy: WFL for tasks assessed/performed Overall Cognitive Status: Within Functional Limits for tasks assessed                                        Exercises Other Exercises Other Exercises: 10x sit<>stand    General Comments General comments (skin integrity, edema, etc.): at rest HR 74bp, SaO2 on 4L O2 98%O2, with ambulation HR max 96bpm, SaO2 dropped to 76%O2 on 4L, on 6L O2 with standing rest break and vc for pursed lip breathing SaO2 rebound to 92%O2, continued to ambulate and maintained SaO2 >89%O2, pt with stress incontinence whenever she coughs  Pertinent Vitals/Pain Pain Assessment: No/denies pain           PT Goals (current goals can now be found in the care plan section) Acute Rehab PT Goals Patient Stated Goal: to go home PT Goal Formulation: With patient Time For Goal Achievement: 12/09/19 Potential to Achieve Goals: Good    Frequency    Min 3X/week      PT Plan Current plan remains appropriate       AM-PAC PT "6 Clicks" Mobility   Outcome Measure  Help  needed turning from your back to your side while in a flat bed without using bedrails?: A Little Help needed moving from lying on your back to sitting on the side of a flat bed without using bedrails?: A Little Help needed moving to and from a bed to a chair (including a wheelchair)?: A Little Help needed standing up from a chair using your arms (e.g., wheelchair or bedside chair)?: A Lot Help needed to walk in hospital room?: A Lot Help needed climbing 3-5 steps with a railing? : A Lot 6 Click Score: 15    End of Session Equipment Utilized During Treatment: Gait belt;Oxygen Activity Tolerance: Patient tolerated treatment well Patient left: with call bell/phone within reach;in chair Nurse Communication: Mobility status PT Visit Diagnosis: Muscle weakness (generalized) (M62.81)     Time: 8325-4982 PT Time Calculation (min) (ACUTE ONLY): 47 min  Charges:  $Gait Training: 8-22 mins $Therapeutic Exercise: 8-22 mins $Therapeutic Activity: 8-22 mins                     Sheldon Sem B. Migdalia Dk PT, DPT Acute Rehabilitation Services Pager [phone] Office [phone]    [place] 11/29/2019, 11:36 AM

## 2019-11-29 NOTE — Progress Notes (Signed)
Pts BIPAP order is PRN. Pt not in need of BIPAP at this time, her respiratory status is stable on 2Lpm [place] w/no distress noted. RT will continue to monitor.

## 2019-11-29 NOTE — Progress Notes (Signed)
PROGRESS NOTE  CODI FOLKERTS PIR:[id number] DOB: 1945-08-29   PCP: System, Pcp Not In  Patient is from: Home.  Lives alone.  Reports 24/7 care.  Wheelchair-bound.  DOA: 11/24/2019 LOS: 4  Brief Narrative / Interim history: 74 year old female with history of COPD/RF on 2 L, diastolic CHF, SVT, PVD, HTN, UC, chronic diarrhea, hypothyroidism, CKD-3 and debility presenting with progressive shortness of breath, edema, productive cough, palpitation and chills.   She was admitted for acute on chronic respiratory failure due to COPD exacerbation.  Also found to be anemic to 7.1 (baseline~9).  BNP 500 (previously about 2000).  Troponin negative x2.  The next day, Hgb dropped to 6.7.  Lovenox discontinued.  Transfused 1 unit.  GI consulted.  Hemoglobin appropriately improved after transfusion.  Hospital course complicated by paroxysmal A. fib with RVR, cardiology following, unable to initiate AV nodal blocking agents due to hypotension; diarrhea/C. difficile positive; acute on chronic respiratory failure with hypoxia-PCCM consulted and signed off, improved and acute on chronic kidney disease.    On evening of 6/7, RN had difficulty getting oral/axillary temperature despite changing probes, rectal temperature low at 94.7 although patient asymptomatic and other vital signs stable.  Checking blood cultures, cortisol level, place Bair hugger, recheck temperature and monitor closely.  Subjective: During morning visit, patient reports ongoing intermittent palpitations but not at that time coinciding with sinus rhythm on her bedside telemetry.  Did have approximately 4 to 5 hours of A. fib with RVR up to 5 AM.  Chronic dyspnea, no worse.  Diarrhea now about the same as at home.  As per RN, several episodes in the daytime and at night, loose.  No abdominal pain reported  A few minutes ago, RN paged to indicate low rectal temperature but no new symptoms.  Objective: Vitals:   11/29/19 0905 11/29/19 1201  11/29/19 1602 11/29/19 1738  BP:  (!) 105/50  (!) 113/59  Pulse: 79 73 96 91  Resp: 17 (!) 26 (!) 23   Temp:  (!) 97.3 F (36.3 C)  (!) 94.7 F (34.8 C)  TempSrc:  Oral  Rectal  SpO2: 98% 100% 94%   Weight:        Intake/Output Summary (Last 24 hours) at 11/29/2019 1801 Last data filed at 11/29/2019 0800 Gross per 24 hour  Intake 120 ml  Output --  Net 120 ml   Filed Weights   11/27/19 0346 11/28/19 0447 11/29/19 0207  Weight: 117.1 kg 118.4 kg 116.8 kg    Examination:  GENERAL: Elderly female, moderately built and morbidly obese lying comfortably propped up in bed without distress.  Chronically ill looking. NECK: No obvious JVD. RESP: Clear to auscultation.  No increased work of breathing CVS: S1 and S2 heard, RRR.  No murmurs.  Bilateral lower extremity chronic blistering edema-does not seem to be much of acute edema.  Telemetry personally reviewed: Sinus rhythm.  Had 4 to 5 hours of A. fib with RVR up to 5 AM. ABD/GI/GU: BS+. Abd soft, NTND.  No organomegaly or masses appreciated. MSK/EXT:  Moves extremities.  Chronic venous insufficiency. SKIN: no apparent skin lesion or wound NEURO: Alert and oriented x3.  No apparent focal neuro deficit. PSYCH: Calm. Normal affect.   Procedures:  None  Microbiology summarized: COVID-19 PCR negative.  Assessment & Plan: Acute on chronic respiratory failure with hypoxia, likely due to COPD exacerbation and symptomatic anemia. -Treated treatable etiologies as below. -Wean oxygen as able and follow PCCM recommendations as below. -Due to tenuous respiratory  status over the weekend, consulted PCCM 6/5, their input appreciated.  They suspect a combination of OSA/OHS/COPD with associated marked nocturnal desaturations.  They recommend mobilizing, incentive spirometry, outpatient CPAP versus BiPAP evaluation (inpatient trial likely to have poor patient compliance).  They recommend oxygen 3 L/min during the day and 5 L at night, DC continuous  pulse ox and trial of metolazone.  Continue LABA/LAMA/ICS.  Needs outpatient sleep study. Improved and stable.  COPD exacerbation resolved.  Symptomatic iron deficiency anemia: Baseline Hgb ~9.0> 7.1 (admit)> 6.7.  Iron sat 8%.  She denies melena or hematochezia but history of UC. -Transfused 1 unit with IV Lasix. -FOBT and GI consult-input appreciated. -Discontinue Lovenox.  SCD for VT prophylaxis. -Hemoglobin stable in the 8 g range.  COPD exacerbation:  -Pulmicort, Incruse Ellipta, Brovana, doxycycline and as needed DuoNeb. -Acute exacerbation resolved.  Acute on chronic diastolic CHF:  -S/p IV Lasix 40 mg x 1 dose couple of days ago.  -Volume status difficult to assess due to body habitus.  S/p a dose of metolazone 6/5.  Now on torsemide 20 daily, continue.  Creatinine is actually improved to 1.84.  New onset paroxysmal A. fib.  Paroxysmal SVT: TSH within normal. -Per cardiology: Unable to anticoagulate with significant anemia.  Also unable to initiate AV nodal agents due to ongoing soft blood pressures -Amiodarone may not be an option given her lung disease.  AKI on CKD-3A/azotemia: Baseline Cr ~1.6> 1.77 (admit)> 2.0 -Avoid nephrotoxic meds -Creatinine up from 1.7-2.11 >2.07 >1.84  Ulcerative colitis with chronic diarrhea/C. difficile diarrhea -GI following.  C. difficile PCR positive.  Oral vancomycin initiated by GI.  Diarrhea transiently better but now worse again-unsure how much of this is her underlying UC versus CDI now.  Imodium discontinued over the weekend due to CDI. -Discussed in detail with Dr. Justice Rocher GI on 6/7: Please refer to his detailed note from same date.  History of left-sided UC, seen in the [facility] IBD clinic 09/14/2019, not on maintenance IBD therapy.   - If diarrhea does not improve in a day or 2, may need to consider increasing the dose of oral vancomycin.  Essential hypertension: -Continue monitoring.  Ongoing soft blood  pressures.  Hypothyroidism: TSH within normal -Continue home Synthroid  Adnexal mass with endometrioma history of thickening 1.7 cm: Noted on CT abdomen pelvis on 11/10/2019.  Increased size. -As discussed in detail with Dr. Justice Rocher GI on 6/7: CT at [facility] 11/10/2019 showed increasing size of right adnexal mass.  She was seen by [place] oncology clinic that day with elevated concern for primary GI etiology given elevated CEA: CA-125 ratio.  [facility] contemplated colonoscopy with patient was hesitant them.  Dr. Bryan Lemma discussed her case with Dr. Loistine Simas in the [facility] IBD clinic who will schedule colonoscopy at [facility] with the high risk procedure service.  She has follow-up scheduled in the clinic on 12/28/2019 and plan for expedited colonoscopy.  They agreed that she needs to recover from her current acute cardiopulmonary issues.  She will need to follow with GYN oncology and GI/IBD service at [facility] who are well familiar with her case.  Debility-wheelchair-bound at baseline.  Reports 24/7 home care -PT evaluated and recommend home health PT.  Hypothermia -Rectal temperature 94.7, extensively discussed with patient's RN-patient has no new symptoms.  Other vital signs stable.  Mentating fine.  Not cold or clammy.  Unclear etiology.  Check blood cultures, random cortisol.  Advised to place [facility], recheck temperature in an hour to  and monitor closely.          DVT prophylaxis: SCD Code Status: Full code Family Communication: Patient and/or RN. Available if any question.  Status is: Inpatient  Remains inpatient appropriate because:Ongoing diagnostic testing needed not appropriate for outpatient work up and Inpatient level of care appropriate due to severity of illness.  Symptomatic anemia requiring blood transfusion and COPD exacerbation   Dispo: The patient is from: Home              Anticipated d/c is to: Home              Anticipated d/c date is: 2 days.  Patient is not medically stable  for discharge due to increased diarrhea, paroxysmal A. fib with RVR which is symptomatic that may need definitive treatment prior to DC.              Patient currently is not medically stable to d/c.       Consultants:  GI Cardiology   Sch Meds:  Scheduled Meds: . arformoterol  15 mcg Nebulization BID  . budesonide (PULMICORT) nebulizer solution  0.5 mg Nebulization BID  . doxycycline  100 mg Oral Q12H  . ferrous sulfate  325 mg Oral Q breakfast  . levothyroxine  175 mcg Oral Daily  . sodium chloride flush  3 mL Intravenous Once  . torsemide  20 mg Oral Daily  . umeclidinium bromide  1 puff Inhalation Daily  . vancomycin  125 mg Oral QID   Continuous Infusions:  PRN Meds:.ipratropium-albuterol  Antimicrobials: Anti-infectives (From admission, onward)   Start     Dose/Rate Route Frequency Ordered Stop   11/26/19 1400  vancomycin (VANCOCIN) 50 mg/mL oral solution 125 mg     125 mg Oral 4 times daily 11/26/19 1300 12/06/19 1359   11/25/19 1000  doxycycline (VIBRA-TABS) tablet 100 mg     100 mg Oral Every 12 hours 11/25/19 0026 11/30/19 0959   11/24/19 2245  azithromycin (ZITHROMAX) 500 mg in sodium chloride 0.9 % 250 mL IVPB     500 mg 250 mL/hr over 60 Minutes Intravenous  Once 11/24/19 2232 11/25/19 0443       I have personally reviewed the following labs and images: CBC: Recent Labs  Lab 11/25/19 0622 11/25/19 0622 11/25/19 2206 11/25/19 2359 11/27/19 0437 11/28/19 0024 11/29/19 0405  WBC 4.0  --   --  4.3 2.5* 3.7* 3.9*  HGB 6.7*   < > 8.1* 8.0* 7.4* 8.3* 8.5*  HCT 24.5*   < > 28.4* 28.2* 27.0* 30.2* 31.0*  MCV 109.9*  --   --  105.6* 108.0* 107.5* 108.8*  PLT 124*  --   --  135* 120* 139* 146*   < > = values in this interval not displayed.   BMP &GFR Recent Labs  Lab 11/25/19 0622 11/26/19 0355 11/27/19 0437 11/28/19 0024 11/29/19 0405  NA 145 142 146* 142 145  K 4.1 4.9 3.8 4.1 4.2  CL 99 99 99 97* 99  CO2 37* 33* 37* 32 39*  GLUCOSE 85 112*  75 80 77  BUN 46* 46* 47* 56* 50*  CREATININE 2.00* 1.77* 2.11* 2.07* 1.84*  CALCIUM 7.3* 6.9* 7.2* 7.4* 7.6*  MG 1.8 1.8  --   --   --   PHOS 4.2 4.5  --   --   --    Estimated Creatinine Clearance: 33.2 mL/min (A) (by C-G formula based on SCr of 1.84 mg/dL (H)). Liver & Pancreas: Recent Labs  Lab 11/25/19 0622 11/26/19 0355  ALBUMIN 2.8* 2.8*    Recent Labs  Lab 11/25/19 1607  GLUCAP 84   Cardiac Enzymes: No results for input(s): CKTOTAL, CKMB, CKMBINDEX, TROPONINI in the last 168 hours. Recent Labs    09/29/19 1616 10/22/19 1220  PROBNP 1,266* 2,944*    Microbiology: Recent Results (from the past 240 hour(s))  SARS Coronavirus 2 by RT PCR (hospital order, performed in [facility] hospital lab) Nasopharyngeal Nasopharyngeal Swab     Status: None   Collection Time: 11/24/19 11:50 PM   Specimen: Nasopharyngeal Swab  Result Value Ref Range Status   SARS Coronavirus 2 NEGATIVE NEGATIVE Final    Comment: (NOTE) SARS-CoV-2 target nucleic acids are NOT DETECTED. The SARS-CoV-2 RNA is generally detectable in upper and lower respiratory specimens during the acute phase of infection. The lowest concentration of SARS-CoV-2 viral copies this assay can detect is 250 copies / mL. A negative result does not preclude SARS-CoV-2 infection and should not be used as the sole basis for treatment or other patient management decisions.  A negative result may occur with improper specimen collection / handling, submission of specimen other than nasopharyngeal swab, presence of viral mutation(s) within the areas targeted by this assay, and inadequate number of viral copies (<250 copies / mL). A negative result must be combined with clinical observations, patient history, and epidemiological information. Fact Sheet for Patients:   StrictlyIdeas.no Fact Sheet for Healthcare Providers: BankingDealers.co.za This test is not yet approved or  cleared  by the [place] FDA and has been authorized for detection and/or diagnosis of SARS-CoV-2 by FDA under an Emergency Use Authorization (EUA).  This EUA will remain in effect (meaning this test can be used) for the duration of the COVID-19 declaration under Section 564(b)(1) of the Act, 21 U.S.C. section 360bbb-3(b)(1), unless the authorization is terminated or revoked sooner. Performed at [facility], [place] [address] [place], [place] [address]   C Difficile Quick Screen w PCR reflex     Status: Abnormal   Collection Time: 11/25/19 11:59 PM   Specimen: STOOL  Result Value Ref Range Status   C Diff antigen POSITIVE (A) NEGATIVE Final   C Diff toxin NEGATIVE NEGATIVE Final   C Diff interpretation Results are indeterminate. See PCR results.  Final    Comment: Performed at [facility], [place] [address] [place], [place] [address]  C. Diff by PCR, Reflexed     Status: Abnormal   Collection Time: 11/25/19 11:59 PM  Result Value Ref Range Status   Toxigenic C. Difficile by PCR POSITIVE (A) NEGATIVE Final    Comment: Positive for toxigenic C. difficile with little to no toxin production. Only treat if clinical presentation suggests symptomatic illness. Performed at [facility], [place] [address], [place], [place] [address]     Radiology Studies: No results found.    Vernell Leep, MD, [place], [place]. [facility]  To contact the attending provider between 7A-7P or the covering provider during after hours 7P-7A, please log into the web site [email or web address] and access using universal [place] password for that web site. If you do not have the password, please call the hospital operator.

## 2019-11-30 LAB — BASIC METABOLIC PANEL
Anion gap: 9 (ref 5–15)
BUN: 19 mg/dL (ref 8–23)
CO2: 23 mmol/L (ref 22–32)
Calcium: 8.9 mg/dL (ref 8.9–10.3)
Chloride: 102 mmol/L (ref 98–111)
Creatinine, Ser: 1.36 mg/dL — ABNORMAL HIGH (ref 0.44–1.00)
GFR calc Af Amer: 44 mL/min — ABNORMAL LOW (ref >60)
GFR calc non Af Amer: 38 mL/min — ABNORMAL LOW (ref >60)
Glucose, Bld: 238 mg/dL — ABNORMAL HIGH (ref 70–99)
Potassium: 4.8 mmol/L (ref 3.5–5.1)
Sodium: 134 mmol/L — ABNORMAL LOW (ref 135–145)

## 2019-11-30 LAB — CBC
HCT: 27.5 % — ABNORMAL LOW (ref 36.0–46.0)
Hemoglobin: 7.8 g/dL — ABNORMAL LOW (ref 12.0–15.0)
MCH: 30.6 pg (ref 26.0–34.0)
MCHC: 28.4 g/dL — ABNORMAL LOW (ref 30.0–36.0)
MCV: 107.8 fL — ABNORMAL HIGH (ref 80.0–100.0)
Platelets: 132 10*3/uL — ABNORMAL LOW (ref 150–400)
RBC: 2.55 MIL/uL — ABNORMAL LOW (ref 3.87–5.11)
RDW: 15.9 % — ABNORMAL HIGH (ref 11.5–15.5)
WBC: 3.6 10*3/uL — ABNORMAL LOW (ref 4.0–10.5)
nRBC: 0.6 % — ABNORMAL HIGH (ref 0.0–0.2)

## 2019-11-30 MED ORDER — METOPROLOL TARTRATE 12.5 MG HALF TABLET
12.5000 mg | ORAL_TABLET | Freq: Two times a day (BID) | ORAL | Status: DC
  Administered 2019-11-30 – 2019-12-01 (×3): 12.5 mg via ORAL
  Filled 2019-11-30 (×3): qty 1

## 2019-11-30 NOTE — Progress Notes (Signed)
Progress Note  Patient Name: Jamie Montoya Date of Encounter: 11/30/2019  [facility] Cardiologist: Ena Dawley, MD   Subjective   Breathing is fair  On higher dose of O2 than at home   Denies CP    Inpatient Medications    Scheduled Meds: . arformoterol  15 mcg Nebulization BID  . budesonide (PULMICORT) nebulizer solution  0.5 mg Nebulization BID  . ferrous sulfate  325 mg Oral Q breakfast  . levothyroxine  175 mcg Oral Daily  . sodium chloride flush  3 mL Intravenous Once  . torsemide  20 mg Oral Daily  . umeclidinium bromide  1 puff Inhalation Daily  . vancomycin  125 mg Oral QID   Continuous Infusions:  PRN Meds: ipratropium-albuterol   Vital Signs    Vitals:   11/29/19 2143 11/30/19 0000 11/30/19 0423 11/30/19 0804  BP:  (!) 106/53 102/66 (!) 118/45  Pulse: 100 98    Resp: 17 (!) 26 (!) 22 18  Temp:  (!) 97.5 F (36.4 C) 98 F (36.7 C) 97.7 F (36.5 C)  TempSrc:  Oral Oral Oral  SpO2:  98%    Weight:   115 kg     Intake/Output Summary (Last 24 hours) at 11/30/2019 1108 Last data filed at 11/30/2019 0600 Gross per 24 hour  Intake --  Output 730 ml  Net -730 ml   Last 3 Weights 11/30/2019 11/29/2019 11/28/2019  Weight (lbs) 253 lb 8.5 oz 257 lb 9.6 oz 261 lb 1.6 oz  Weight (kg) 115 kg 116.847 kg 118.434 kg      Telemetry    SR and atrial fibrillation   90s to 100s   Personally Reviewed  ECG    n/a - Personally Reviewed  Physical Exam   GEN: No acute distress.   Neck: No JVD Cardiac: RRR, no murmurs Respiratory: Clear to auscultation bilaterally. GI: Soft, nontender, non-distended  MS:  Tr edema; No deformity.  Chronic skin changes  Neuro:  Nonfocal    Labs    High Sensitivity Troponin:   Recent Labs  Lab 11/24/19 1739 11/24/19 2020  TROPONINIHS 17 14      Chemistry Recent Labs  Lab 11/25/19 0622 11/25/19 0622 11/26/19 0355 11/27/19 0437 11/28/19 0024 11/29/19 0405 11/30/19 0454  NA 145   < > 142   < > 142 145 134*  K 4.1    < > 4.9   < > 4.1 4.2 4.8  CL 99   < > 99   < > 97* 99 102  CO2 37*   < > 33*   < > 32 39* 23  GLUCOSE 85   < > 112*   < > 80 77 238*  BUN 46*   < > 46*   < > 56* 50* 19  CREATININE 2.00*   < > 1.77*   < > 2.07* 1.84* 1.36*  CALCIUM 7.3*   < > 6.9*   < > 7.4* 7.6* 8.9  ALBUMIN 2.8*  --  2.8*  --   --   --   --   GFRNONAA 24*   < > 28*   < > 23* 26* 38*  GFRAA 28*   < > 32*   < > 26* 31* 44*  ANIONGAP 9   < > 10   < > 13 7 9    < > = values in this interval not displayed.     Hematology Recent Labs  Lab 11/28/19 0024 11/29/19 0405 11/30/19  0454  WBC 3.7* 3.9* 3.6*  RBC 2.81* 2.85* 2.55*  HGB 8.3* 8.5* 7.8*  HCT 30.2* 31.0* 27.5*  MCV 107.5* 108.8* 107.8*  MCH 29.5 29.8 30.6  MCHC 27.5* 27.4* 28.4*  RDW 16.4* 16.5* 15.9*  PLT 139* 146* 132*    BNP Recent Labs  Lab 11/24/19 1739 11/29/19 0405  BNP 589.6* 967.9*     DDimer No results for input(s): DDIMER in the last 168 hours.   Radiology    No results found.  Cardiac Studies    Patient Profile     74 y.o. female with a hx of HFpEF, Crohn's,wheelchair bound, PSVT, COPD with chronic respiratory failure on home O2, RBBB, hypothyroidism, HLD, CKD stage 3,chronic leg edema, esophageal stricture and possible malignancywho is being seen for the evaluation of shortness of breath.  Assessment & Plan    1. New diagnosis PAF Pt continues to have atrial fibrillation intermitt.  Rates are not that bad.   Unfortunately she cannot be on anticoagulation with anemia and Hx of UC.    REnal function is extremely labile   Cr 2.1 on 6/5   NOw 1.36    Pulmonary function  precludes amiodarone use   Would recomm rate control  And no anticoagualation BP has been marginal precluding b blocker Rx now   (80s-110s)    WIll follow closely as outpt     2  Hx HFpEF   Pt remains on Demedex 20 mg  Volume does not appear bad    2. SOB - multifactorial,seen by pulmonary. Combination OSA/OHS/COPD BNP is up some   She does appear to  have some increased volume on exam  On oral torsemide  Got am dose   Would not dose again today   3. Anemia -  Hgb was 7.4  Today 8.3  GI following    4  GI   Hx UC    Note plans for Vancomycin  F/U planned for [facility]    For questions or updates, please contact [place] Please consult [email or web address] for contact info under        Signed, Dorris Carnes, MD  11/30/2019, 11:08 AM

## 2019-11-30 NOTE — Progress Notes (Signed)
Physical Therapy Treatment Patient Details Name: Jamie Montoya MRN: [id number] DOB: 1946/04/30 Today's Date: 11/30/2019    History of Present Illness 74 y.o. female with a hx of HFpEF, Crohn's, wheelchair bound, PSVT, COPD with chronic respiratory failure on home O2, RBBB, hypothyroidism, HLD, CKD stage 3, chronic leg edema, esophageal stricture and possible malignancy who is being seen for the evaluation of shortness of breath.    PT Comments    Pt supine in bed on entry agreeable to ambulation in hallway today. Pt requires close chair follow due to decreased strength and endurance. Pt min guard for bed mobility, transfers and ambulation of a total of 160 feet with RW. Pt requires multiple seated rest breaks during ambulation due to fatigue. Pt O2 saturation on 6L O2 >96%O2 throughout and pt with mild SoB. D/c plans remain appropriate at this time. PT will continue to follow acutely.     Follow Up Recommendations  Home health PT;Supervision/Assistance - 24 hour     Equipment Recommendations  None recommended by PT(unless pt can get wider wheelchair and 3N1)       Precautions / Restrictions Precautions Precautions: Fall Restrictions Weight Bearing Restrictions: No    Mobility  Bed Mobility Overal bed mobility: Needs Assistance Bed Mobility: Supine to Sit     Supine to sit: Min guard;HOB elevated     General bed mobility comments: min guard for safety with coming to EoB, HoB elevated and heavy use of bedrail to come to EoB  Transfers Overall transfer level: Needs assistance Equipment used: Rolling walker (2 wheeled) Transfers: Sit to/from Stand Sit to Stand: Min guard         General transfer comment: min guard for safety with power up to RW  Ambulation/Gait Ambulation/Gait assistance: Min guard Gait Distance (Feet): 40 Feet(3x40, 2x20 ) Assistive device: Rolling walker (2 wheeled) Gait Pattern/deviations: Step-through pattern;Shuffle;Trunk flexed;Wide base of  support Gait velocity: slowed Gait velocity interpretation: <1.8 ft/sec, indicate of risk for recurrent falls General Gait Details: min guard for safety with slow, waddling gait,       Balance Overall balance assessment: Needs assistance Sitting-balance support: Feet supported;No upper extremity supported Sitting balance-Leahy Scale: Good     Standing balance support: Bilateral upper extremity supported;During functional activity Standing balance-Leahy Scale: Fair Standing balance comment: requires B UE support while therapist pulled up Depends                            Cognition Arousal/Alertness: Awake/alert Behavior During Therapy: WFL for tasks assessed/performed Overall Cognitive Status: Within Functional Limits for tasks assessed                                           General Comments General comments (skin integrity, edema, etc.): at rest pt on 5L O2 via [place] with SaO2 98%O2, ambulated on 6L O2 and maintained SaO2 94-100% SaO2 max HR with ambulation 96 bpm. Pt placed in disposable undergarment due to stress incontinence and C-diff, given pt body habitus garment snug. PT request pt to stand from recliner to remove once in room. Pt refused. Educated on possiblity of pressure injury, pt replies she will just have them taken off when she gets back to bed. RN notified       Pertinent Vitals/Pain Pain Assessment: No/denies pain  PT Goals (current goals can now be found in the care plan section) Acute Rehab PT Goals Patient Stated Goal: to go home PT Goal Formulation: With patient Time For Goal Achievement: 12/09/19 Potential to Achieve Goals: Good Progress towards PT goals: Progressing toward goals    Frequency    Min 3X/week      PT Plan Current plan remains appropriate       AM-PAC PT "6 Clicks" Mobility   Outcome Measure  Help needed turning from your back to your side while in a flat bed without using bedrails?: A  Little Help needed moving from lying on your back to sitting on the side of a flat bed without using bedrails?: A Little Help needed moving to and from a bed to a chair (including a wheelchair)?: A Little Help needed standing up from a chair using your arms (e.g., wheelchair or bedside chair)?: A Lot Help needed to walk in hospital room?: A Lot Help needed climbing 3-5 steps with a railing? : A Lot 6 Click Score: 15    End of Session Equipment Utilized During Treatment: Gait belt;Oxygen Activity Tolerance: Patient tolerated treatment well Patient left: with call bell/phone within reach;in chair;with chair alarm set Nurse Communication: Mobility status;Other (comment)(need to remove Depends ) PT Visit Diagnosis: Muscle weakness (generalized) (M62.81)     Time: 8590-9311 PT Time Calculation (min) (ACUTE ONLY): 39 min  Charges:  $Gait Training: 38-52 mins                     Darel Ricketts B. Migdalia Dk PT, DPT Acute Rehabilitation Services Pager [phone] Office [phone]    [place] 11/30/2019, 12:00 PM

## 2019-11-30 NOTE — Progress Notes (Addendum)
    Progress Note   Subjective  Chief Complaint: Adnexal mass, UC, C. difficile  This morning patient is found right after having a bowel movement, there is some semisolid stool in the toilet with also some liquid, but this is an improvement from previous.  She tells me she is having at least 3-4 bowel movements a day and then 3-4 in the evening too.  Denies any new complaints or concerns.    Objective   Vital signs in last 24 hours: Temp:  [94.7 F (34.8 C)-98 F (36.7 C)] 97.7 F (36.5 C) (06/08 0804) Pulse Rate:  [73-104] 98 (06/08 0000) Resp:  [17-26] 18 (06/08 0804) BP: (96-118)/(45-66) 118/45 (06/08 0804) SpO2:  [92 %-100 %] 98 % (06/08 0000) Weight:  [885 kg] 115 kg (06/08 0423) Last BM Date: 11/29/19 General:    white female in NAD Heart:  Regular rate and rhythm; no murmurs Lungs: Respirations even and unlabored, lungs CTA bilaterally +o2 via  Abdomen:  Soft, nontender and nondistended. Normal bowel sounds. Neurologic:  Alert and oriented,  grossly normal neurologically. Psych:  Cooperative. Normal mood and affect.  Intake/Output from previous day: 06/07 0701 - 06/08 0700 In: 120 [P.O.:120] Out: 730 [Urine:727; Stool:3]  Lab Results: Recent Labs    11/28/19 0024 11/29/19 0405 11/30/19 0454  WBC 3.7* 3.9* 3.6*  HGB 8.3* 8.5* 7.8*  HCT 30.2* 31.0* 27.5*  PLT 139* 146* 132*   BMET Recent Labs    11/28/19 0024 11/29/19 0405 11/30/19 0454  NA 142 145 134*  K 4.1 4.2 4.8  CL 97* 99 102  CO2 32 39* 23  GLUCOSE 80 77 238*  BUN 56* 50* 19  CREATININE 2.07* 1.84* 1.36*  CALCIUM 7.4* 7.6* 8.9     Assessment / Plan:   Assessment: 1.  Ulcerative colitis 2.  C. difficile 3.  Adnexal mass 4.  Chronic supplemental O2  Plan: 1.  Please see progress note from yesterday, Dr. Bryan Lemma had extensive conversations with [facility].  They are prepared to take over care for this patient as they had previously been following her.  I discussed this with the patient  today, she was somewhat dismayed that she cannot get everything done here, but I answered her questions and hopefully she now knows that being seen in a tertiary care center is the best thing for her, especially since she had already established with them. 2. Continue Vancomycin for Cdiff 3.  Please await any final recommendations from Dr. Fuller Plan, but we will be signing off today  Thank you for kind consultation.   LOS: 5 days   Levin Erp  11/30/2019, 9:46 AM     Attending Physician Note   I have taken an interval history, reviewed the chart and examined the patient. I agree with the Advanced Practitioner's note, impression and recommendations.   Complete a 10 day course of vancomycin Outpatient GI follow up at [facility] with Dr. Loistine Simas as outlined in Dr. Vivia Ewing 6/7 note GI signing off  Lucio Edward, MD [facility]

## 2019-11-30 NOTE — Progress Notes (Signed)
Bipap (V60) is at bedside.  Patient apparently has not worn since the 4th.  No distress noted, will continue to monitor.  Order is PRN.

## 2019-11-30 NOTE — Progress Notes (Signed)
PROGRESS NOTE  Jamie Montoya BWI:[id number] DOB: 1945/09/08 DOA: 11/24/2019 PCP: System, Pcp Not In   LOS: 5 days   Brief Narrative / Interim history: 74 year old female with history of COPD/RF on 2 L, diastolic CHF, SVT, PVD, HTN, UC, chronic diarrhea, hypothyroidism, CKD-3 and debility presenting with progressive shortness of breath, edema, productive cough, palpitation and chills. She was admitted for acute on chronic respiratory failure due to COPD exacerbation.  Also found to be anemic to 7.1 (baseline~9).  BNP 500 (previously about 2000).  Troponin negative x2. The next day, Hgb dropped to 6.7.  Lovenox discontinued.  Transfused 1 unit.  GI consulted.  Hemoglobin appropriately improved after transfusion.  Hospital course complicated by paroxysmal A. fib with RVR, cardiology following, unable to initiate AV nodal blocking agents due to hypotension; diarrhea/C. difficile positive; acute on chronic respiratory failure with hypoxia-PCCM consulted and signed off, improved and acute on chronic kidney disease. On evening of 6/7, RN had difficulty getting oral/axillary temperature despite changing probes, rectal temperature low at 94.7 although patient asymptomatic and other vital signs stable.  Checking blood cultures, cortisol level, place Bair hugger, recheck temperature and monitor closely.  Subjective / 24h Interval events: Reports feeling about the same.  Several episodes of intermittent palpitations, no chest pain.  Chronic shortness of breath about the same.  Diarrhea about the same.  Overall she feels no improvement  Assessment & Plan: Principal Problem Acute on chronic hypoxic respiratory failure-due to COPD exacerbation symptomatic from.  PCCM has been consulted over the weekend due to tenuous respiratory status, suspecting a combination of OSA/OHS/COPD with marked nocturnal desaturations. They recommend mobilizing, incentive spirometry, outpatient CPAP versus BiPAP evaluation (inpatient trial  likely to have poor patient compliance).  They recommend oxygen 3 L/min during the day and 5 L at night, DC continuous pulse ox and trial of metolazone.  Continue LABA/LAMA/ICS.  Needs outpatient sleep study. Improved and stable.  COPD exacerbation resolved.  Active Problems Symptomatic iron deficiency anemia-GI consulted, appreciate input.  No melena, no hematochezia but does have a history of pleurisy.  Per GI, case was discussed with the gastroenterologist at [facility] and it looks like she will have a colonoscopy over there upon discharge  COPD exacerbation-resolved  Acute on chronic diastolic CHF-status post IV Lasix initially, volume status overall difficult to assess due to body habitus.  Received a dose of metolazone on 6/5, now continue home torsemide.  Creatinine improved to 1.3  New onset paroxysmal A. fib-TSH normal, unable to anticoagulate per cardiology with significant anemia and also unable to initiate AV nodal agents due to soft blood pressures.  Amiodarone is less likely to be an option given underlying lung disease.  Discussed with Dr. Harrington Challenger over the phone, will initiate low-dose beta-blockers and see if the blood pressure tolerates  Acute kidney injury on chronic kidney disease stage IIIa-Baseline creatinine about 1.6, currently improved and better than baseline  Ulcerative colitis with chronic diarrhea/C. difficile diarrhea -GI consulted, communicated directly with GI from [facility] and they plan to do colonoscopy over there  Essential hypertension-ongoing soft blood pressures  Adnexal mass - As discussed in detail with Dr. Justice Rocher GI on 6/7: CT at [facility] 11/10/2019 showed increasing size of right adnexal mass.  She was seen by [facility] oncology clinic that day with elevated concern for primary GI etiology given elevated CEA: CA-125 ratio.  [facility] contemplated colonoscopy with patient was hesitant them.  Dr. Bryan Lemma discussed her case with Dr. Loistine Simas in the [facility] IBD  clinic who will  schedule colonoscopy at [facility] with the high risk procedure service.  She has follow-up scheduled in the clinic on 12/28/2019 and plan for expedited colonoscopy.  They agreed that she needs to recover from her current acute cardiopulmonary issues.  She will need to follow with GYN oncology and GI/IBD service at [facility] who are well familiar with her case.  Debility-wheelchair-bound at baseline.  Reports 24/7 home care -PT evaluated and recommend home health PT.  Hypothermia -Rectal temperature 94.7, extensively discussed with patient's RN-patient has no new symptoms.  Other vital signs stable.  Mentating fine.  Not cold or clammy.  Unclear etiology.  Check blood cultures, random cortisol.  Advised to place [facility], recheck temperature in an hour to and monitor closely.  Resolved, cortisol normal  Scheduled Meds: . arformoterol  15 mcg Nebulization BID  . budesonide (PULMICORT) nebulizer solution  0.5 mg Nebulization BID  . ferrous sulfate  325 mg Oral Q breakfast  . levothyroxine  175 mcg Oral Daily  . sodium chloride flush  3 mL Intravenous Once  . torsemide  20 mg Oral Daily  . umeclidinium bromide  1 puff Inhalation Daily  . vancomycin  125 mg Oral QID   Continuous Infusions: PRN Meds:.ipratropium-albuterol  DVT prophylaxis: SCDs Code Status: Full code Family Communication: no family at bedside   Status is: Inpatient  Remains inpatient appropriate because:Continues to require rate control, start low-dose beta-blockers   Dispo: The patient is from: Home              Anticipated d/c is to: Home              Anticipated d/c date is: 1 day              Patient currently is not medically stable to d/c.  Consultants:  Cardiology  GI  Procedures:  None   Microbiology  none  Antimicrobials: Oral vancomycin    Objective: Vitals:   11/29/19 2143 11/30/19 0000 11/30/19 0423 11/30/19 0804  BP:  (!) 106/53 102/66 (!) 118/45  Pulse: 100 98    Resp: 17 (!) 26 (!) 22 18  Temp:   (!) 97.5 F (36.4 C) 98 F (36.7 C) 97.7 F (36.5 C)  TempSrc:  Oral Oral Oral  SpO2:  98%    Weight:   115 kg     Intake/Output Summary (Last 24 hours) at 11/30/2019 1314 Last data filed at 11/30/2019 0600 Gross per 24 hour  Intake --  Output 730 ml  Net -730 ml   Filed Weights   11/28/19 0447 11/29/19 0207 11/30/19 0423  Weight: 118.4 kg 116.8 kg 115 kg    Examination:  Constitutional: NAD Eyes: no scleral icterus ENMT: Mucous membranes are moist.  Neck: normal, supple Respiratory: clear to auscultation bilaterally, no wheezing, no crackles. Normal respiratory effort. No accessory muscle use.  Cardiovascular: Irregularly irregular Abdomen: non distended, no tenderness. Bowel sounds positive.  Musculoskeletal: no clubbing / cyanosis.  Skin: no rashes Neurologic: CN 2-12 grossly intact. Strength 5/5 in all 4.    Data Reviewed: I have independently reviewed following labs and imaging studies   CBC: Recent Labs  Lab 11/25/19 2359 11/27/19 0437 11/28/19 0024 11/29/19 0405 11/30/19 0454  WBC 4.3 2.5* 3.7* 3.9* 3.6*  HGB 8.0* 7.4* 8.3* 8.5* 7.8*  HCT 28.2* 27.0* 30.2* 31.0* 27.5*  MCV 105.6* 108.0* 107.5* 108.8* 107.8*  PLT 135* 120* 139* 146* 013*   Basic Metabolic Panel: Recent Labs  Lab 11/25/19  7846 11/25/19 0622 11/26/19 0355 11/27/19 0437 11/28/19 0024 11/29/19 0405 11/30/19 0454  NA 145   < > 142 146* 142 145 134*  K 4.1   < > 4.9 3.8 4.1 4.2 4.8  CL 99   < > 99 99 97* 99 102  CO2 37*   < > 33* 37* 32 39* 23  GLUCOSE 85   < > 112* 75 80 77 238*  BUN 46*   < > 46* 47* 56* 50* 19  CREATININE 2.00*   < > 1.77* 2.11* 2.07* 1.84* 1.36*  CALCIUM 7.3*   < > 6.9* 7.2* 7.4* 7.6* 8.9  MG 1.8  --  1.8  --   --   --   --   PHOS 4.2  --  4.5  --   --   --   --    < > = values in this interval not displayed.   Liver Function Tests: Recent Labs  Lab 11/25/19 0622 11/26/19 0355  ALBUMIN 2.8* 2.8*   Coagulation Profile: No results for input(s): INR,  PROTIME in the last 168 hours. HbA1C: No results for input(s): HGBA1C in the last 72 hours. CBG: Recent Labs  Lab 11/25/19 1607  GLUCAP 84    Recent Results (from the past 240 hour(s))  SARS Coronavirus 2 by RT PCR (hospital order, performed in [facility] hospital lab) Nasopharyngeal Nasopharyngeal Swab     Status: None   Collection Time: 11/24/19 11:50 PM   Specimen: Nasopharyngeal Swab  Result Value Ref Range Status   SARS Coronavirus 2 NEGATIVE NEGATIVE Final    Comment: (NOTE) SARS-CoV-2 target nucleic acids are NOT DETECTED. The SARS-CoV-2 RNA is generally detectable in upper and lower respiratory specimens during the acute phase of infection. The lowest concentration of SARS-CoV-2 viral copies this assay can detect is 250 copies / mL. A negative result does not preclude SARS-CoV-2 infection and should not be used as the sole basis for treatment or other patient management decisions.  A negative result may occur with improper specimen collection / handling, submission of specimen other than nasopharyngeal swab, presence of viral mutation(s) within the areas targeted by this assay, and inadequate number of viral copies (<250 copies / mL). A negative result must be combined with clinical observations, patient history, and epidemiological information. Fact Sheet for Patients:   StrictlyIdeas.no Fact Sheet for Healthcare Providers: BankingDealers.co.za This test is not yet approved or cleared  by the [place] FDA and has been authorized for detection and/or diagnosis of SARS-CoV-2 by FDA under an Emergency Use Authorization (EUA).  This EUA will remain in effect (meaning this test can be used) for the duration of the COVID-19 declaration under Section 564(b)(1) of the Act, 21 U.S.C. section 360bbb-3(b)(1), unless the authorization is terminated or revoked sooner. Performed at [facility], [place] [address],  [place], [place] [address]   C Difficile Quick Screen w PCR reflex     Status: Abnormal   Collection Time: 11/25/19 11:59 PM   Specimen: STOOL  Result Value Ref Range Status   C Diff antigen POSITIVE (A) NEGATIVE Final   C Diff toxin NEGATIVE NEGATIVE Final   C Diff interpretation Results are indeterminate. See PCR results.  Final    Comment: Performed at [facility], [place] [address] [place], [place] [address]  C. Diff by PCR, Reflexed     Status: Abnormal   Collection Time: 11/25/19 11:59 PM  Result Value Ref Range Status   Toxigenic  C. Difficile by PCR POSITIVE (A) NEGATIVE Final    Comment: Positive for toxigenic C. difficile with little to no toxin production. Only treat if clinical presentation suggests symptomatic illness. Performed at [facility], [place] [address] [place], [place] [address]   Culture, blood (Routine X 2) w Reflex to ID Panel     Status: None (Preliminary result)   Collection Time: 11/29/19  7:10 PM   Specimen: BLOOD  Result Value Ref Range Status   Specimen Description BLOOD RIGHT ANTECUBITAL  Final   Special Requests   Final    BOTTLES DRAWN AEROBIC AND ANAEROBIC Blood Culture adequate volume   Culture   Final    NO GROWTH < 12 HOURS Performed at [facility], [place] [address], [place], [place] [address]    Report Status PENDING  Incomplete  Culture, blood (Routine X 2) w Reflex to ID Panel     Status: None (Preliminary result)   Collection Time: 11/29/19  7:14 PM   Specimen: BLOOD LEFT HAND  Result Value Ref Range Status   Specimen Description BLOOD LEFT HAND  Final   Special Requests   Final    BOTTLES DRAWN AEROBIC ONLY Blood Culture results may not be optimal due to an inadequate volume of blood received in culture bottles   Culture   Final    NO GROWTH < 12 HOURS Performed at [facility], [place] [address] [place], [place] [address]    Report Status PENDING  Incomplete     Radiology Studies: No results found.   Marzetta Board, MD, PhD [facility]  Between 7 am - 7 pm I am available, please contact me via Amion or Securechat  Between 7 pm - 7 am I am not available, please contact night coverage MD/APP via Amion

## 2019-12-01 MED ORDER — VANCOMYCIN 50 MG/ML ORAL SOLUTION: 125.0000 mg | Freq: Four times a day (QID) | ORAL | 0 refills | Status: AC

## 2019-12-01 MED ORDER — METOPROLOL TARTRATE 25 MG PO TABS: 12.5000 mg | ORAL_TABLET | Freq: Two times a day (BID) | ORAL | 1 refills | Status: DC

## 2019-12-01 MED ORDER — VANCOMYCIN 50 MG/ML ORAL SOLUTION: 125.0000 mg | Freq: Four times a day (QID) | ORAL | 0 refills | Status: DC

## 2019-12-01 NOTE — TOC Initial Note (Signed)
Transition of Care [facility]) - Initial/Assessment Note    Patient Details  Name: Jamie Montoya MRN: [id number] Date of Birth: Jul 16, 1945  Transition of Care [facility]) CM/SW Contact:    Bethena Roys, RN Phone Number: 12/01/2019, 12:37 PM  Clinical Narrative:  Case Manager spoke with patient regarding home needs and durable medical equipment. Patient initially from home with the support of a 24 hour sitter. Patient has used [facility] in the past and wants to utilize again. Medicare.gov list provided to patient and copy placed in shadow chart. Referral given to [facility] with [facility] and start of care to begin within 24-48 hours post transition home. Patient has oxygen via [employer], but now needs increased liter flow. New orders written and [employer] will bring portable tank to the room and the patient will be delivered a new concentrator. Sitter to provide transportation home today. No further needs from Case Manager at this time.                Expected Discharge Plan: [place] Barriers to Discharge: No Barriers Identified   Patient Goals and CMS Choice Patient states their goals for this hospitalization and ongoing recovery are:: to return home with support of 24 hour sitter. CMS Medicare.gov Compare Post Acute Care list provided to:: Patient Choice offered to / list presented to : Patient  Expected Discharge Plan and Services Expected Discharge Plan: [place] In-house Referral: NA Discharge Planning Services: CM Consult Post Acute Care Choice: Home Health, Durable Medical Equipment Living arrangements for the past 2 months: Single Family Home Expected Discharge Date: 12/01/19               DME Arranged: Oxygen DME Agency: [employer] Date DME Agency Contacted: 12/01/19 Time DME Agency Contacted: 1200 Representative spoke with at DME Agency: Thedore Mins HH Arranged: RN, Disease Management, PT [place] Agency: [place] Date [place]: 12/01/19 Time [place]: 1215 Representative spoke with at [place]: Dorian Pod  Prior Living Arrangements/Services Living arrangements for the past 2 months: [place] with:: Other (Comment)(sitter that lives with patient.) Patient language and need for interpreter reviewed:: Yes Do you feel safe going back to the place where you live?: Yes      Need for Family Participation in Patient Care: Yes (Comment) Care giver support system in place?: Yes (comment)   Criminal Activity/Legal Involvement Pertinent to Current Situation/Hospitalization: No - Comment as needed  Activities of Daily [place] Devices/Equipment: Hospital bed, Wheelchair, Oxygen(2L [place]) ADL Screening (condition at time of admission) Patient's cognitive ability adequate to safely complete daily activities?: Yes Is the patient deaf or have difficulty hearing?: No Does the patient have difficulty seeing, even when wearing glasses/contacts?: No Does the patient have difficulty concentrating, remembering, or making decisions?: No Patient able to express need for assistance with ADLs?: Yes Does the patient have difficulty dressing or bathing?: No Independently performs ADLs?: Yes (appropriate for developmental age) Does the patient have difficulty walking or climbing stairs?: Yes Weakness of Legs: Both Weakness of Arms/Hands: None  Permission Sought/Granted Permission sought to share information with : Family Supports, Chartered certified accountant granted to share information with : Yes, Verbal Permission Granted     Permission granted to share info w AGENCY: [facility] and [employer]        Emotional Assessment Appearance:: Appears stated age Attitude/Demeanor/Rapport: Engaged Affect (typically observed): Appropriate Orientation: :  Oriented to Situation, Oriented to  Time, Oriented to Place, Oriented to  Self Alcohol / Substance Use: Not Applicable Psych Involvement: No (comment)  Admission diagnosis:  SOB (shortness of breath) [R06.02] Hypoxia [R09.02] Acute respiratory failure with hypoxia (HCC) [J96.01] Patient Active Problem List   Diagnosis Date Noted   Diarrhea of presumed infectious origin    Adnexal mass    Acute respiratory failure with hypoxia ([place]) 11/25/2019   COPD with acute exacerbation ([place]) 11/25/2019   Thrombocytopenia ([place]) 11/25/2019   Essential hypertension 11/25/2019   Acute heart failure with normal ejection fraction ([place]) 07/18/2017   CHF exacerbation ([place]) 07/17/2017   Bilateral lower leg cellulitis 12/04/2016   Acute on chronic diastolic heart failure ([place]) 09/18/2016   AKI (acute kidney injury) ([place]) 08/26/2016   Paroxysmal SVT (supraventricular tachycardia) (HCC) 08/19/2016   Chronic diastolic CHF (congestive heart failure) ([place]) 08/13/2016   Hearing loss due to cerumen impaction, left 08/04/2016   Hypokalemia 08/01/2016   Chronic venous stasis dermatitis of both lower extremities 07/31/2016   Acute on chronic respiratory failure with hypoxia ([place]) 07/30/2016   HLD (hyperlipidemia) 07/30/2016   Hypothyroidism 07/30/2016   Anxiety 07/30/2016   History of colonic polyps    Personal history of adenomatous colonic polyps 11/13/2011   GERD with stricture 09/20/2010   Morbid obesity ([place]) 09/06/2010   COPD (chronic obstructive pulmonary disease) ([place]) 09/06/2010   Ulcerative colitis, chronic ([place]) 09/06/2010   PCP:  System, Pcp Not In Pharmacy:   [facility] [address], [place] [address] Phone: [phone] Fax: [place], [place], [address] [place], [place] [address] Phone: [phone] Fax: [place], [place] [address] Phone: [phone] Fax: [phone]  [id number] [place], [id number]: [phone]: [phone]  Readmission Risk Interventions No flowsheet data found.

## 2019-12-01 NOTE — Progress Notes (Signed)
Patient continues to exhibit signs of hypercapnia associated with morbid obesity that is causing thoracic restriction.  Interruption or failure to provide NIV would quickly lead to exacerbation of the patient's condition, hospital admission, and likely harm to the patient. Continued use is preferred.  The use of the NIV will treat patient's high PC02 levels and can reduce risk of exacerbations and future hospitalizations when used at night and during the day.  BiLevel/RAD has been considered and ruled out as patient requires continuous alarms, backup battery, and portability which are not possible with BiLevel/RAD devices.  Ventilation is required to decrease the work of breathing and improve pulmonary status. Interruption of ventilator support would lead to decline of health status.  Patient is able to protect their airways and clear secretions on their own.  Jamie Montoya M. Cruzita Lederer, MD, PhD [facility]  Between 7 am - 7 pm you can contact me via [place] or [place].  I am not available 7 pm - 7 am, please contact night coverage MD/APP via Amion

## 2019-12-01 NOTE — Discharge Instructions (Signed)
Follow with PCP in 1 to 2 weeks Follow-up with [facility] gastroenterology in 1 to 2 weeks  Please get a complete blood count and chemistry panel checked by your Primary MD at your next visit, and again as instructed by your Primary MD. Please get your medications reviewed and adjusted by your Primary MD.  Please request your Primary MD to go over all Hospital Tests and Procedure/Radiological results at the follow up, please get all Hospital records sent to your Prim MD by signing hospital release before you go home.  In some cases, there will be blood work, cultures and biopsy results pending at the time of your discharge. Please request that your primary care M.D. goes through all the records of your hospital data and follows up on these results.  If you had Pneumonia of Lung problems at the Hospital: Please get a 2 view Chest X ray done in 6-8 weeks after hospital discharge or sooner if instructed by your Primary MD.  If you have Congestive Heart Failure: Please call your Cardiologist or Primary MD anytime you have any of the following symptoms:  1) 3 pound weight gain in 24 hours or 5 pounds in 1 week  2) shortness of breath, with or without a dry hacking cough  3) swelling in the hands, feet or stomach  4) if you have to sleep on extra pillows at night in order to breathe  Follow cardiac low salt diet and 1.5 lit/day fluid restriction.  If you have diabetes Accuchecks 4 times/day, Once in AM empty stomach and then before each meal. Log in all results and show them to your primary doctor at your next visit. If any glucose reading is under 80 or above 300 call your primary MD immediately.  If you have Seizure/Convulsions/Epilepsy: Please do not drive, operate heavy machinery, participate in activities at heights or participate in high speed sports until you have seen by Primary MD or a Neurologist and advised to do so again. Per [facility] statutes, patients with seizures are not  allowed to drive until they have been seizure-free for six months.  Use caution when using heavy equipment or power tools. Avoid working on ladders or at heights. Take showers instead of baths. Ensure the water temperature is not too high on the home water heater. Do not go swimming alone. Do not lock yourself in a room alone (i.e. bathroom). When caring for infants or small children, sit down when holding, feeding, or changing them to minimize risk of injury to the child in the event you have a seizure. Maintain good sleep hygiene. Avoid alcohol.   If you had Gastrointestinal Bleeding: Please ask your Primary MD to check a complete blood count within one week of discharge or at your next visit. Your endoscopic/colonoscopic biopsies that are pending at the time of discharge, will also need to followed by your Primary MD.  Get Medicines reviewed and adjusted. Please take all your medications with you for your next visit with your Primary MD  Please request your Primary MD to go over all hospital tests and procedure/radiological results at the follow up, please ask your Primary MD to get all Hospital records sent to his/her office.  If you experience worsening of your admission symptoms, develop shortness of breath, life threatening emergency, suicidal or homicidal thoughts you must seek medical attention immediately by calling 911 or calling your MD immediately  if symptoms less severe.  You must read complete instructions/literature along with all the possible  adverse reactions/side effects for all the Medicines you take and that have been prescribed to you. Take any new Medicines after you have completely understood and accpet all the possible adverse reactions/side effects.   Do not drive or operate heavy machinery when taking Pain medications.   Do not take more than prescribed Pain, Sleep and Anxiety Medications  Special Instructions: If you have smoked or chewed Tobacco  in the last 2 yrs  please stop smoking, stop any regular Alcohol  and or any Recreational drug use.  Wear Seat belts while driving.  Please note You were cared for by a hospitalist during your hospital stay. If you have any questions about your discharge medications or the care you received while you were in the hospital after you are discharged, you can call the unit and asked to speak with the hospitalist on call if the hospitalist that took care of you is not available. Once you are discharged, your primary care physician will handle any further medical issues. Please note that NO REFILLS for any discharge medications will be authorized once you are discharged, as it is imperative that you return to your primary care physician (or establish a relationship with a primary care physician if you do not have one) for your aftercare needs so that they can reassess your need for medications and monitor your lab values.  You can reach the hospitalist office at phone [phone] or fax [phone]   If you do not have a primary care physician, you can call [phone] for a physician referral.  Activity: As tolerated with Full fall precautions use walker/cane & assistance as needed    Diet: low sodium  Disposition Home

## 2019-12-01 NOTE — Progress Notes (Signed)
Progress Note  Patient Name: Jamie Montoya Date of Encounter: 12/01/2019  [facility] Cardiologist: Ena Dawley, MD   Subjective     Breathing is OK  No Cp    Inpatient Medications    Scheduled Meds: . arformoterol  15 mcg Nebulization BID  . budesonide (PULMICORT) nebulizer solution  0.5 mg Nebulization BID  . ferrous sulfate  325 mg Oral Q breakfast  . levothyroxine  175 mcg Oral Daily  . metoprolol tartrate  12.5 mg Oral BID  . sodium chloride flush  3 mL Intravenous Once  . torsemide  20 mg Oral Daily  . umeclidinium bromide  1 puff Inhalation Daily  . vancomycin  125 mg Oral QID   Continuous Infusions:  PRN Meds: ipratropium-albuterol   Vital Signs    Vitals:   11/30/19 1958 11/30/19 2349 12/01/19 0414 12/01/19 0417  BP: (!) 96/42 (!) 105/46 (!) 110/55   Pulse: (!) 50 (!) 101    Resp: 20 20 (!) 22   Temp: 97.6 F (36.4 C) 97.7 F (36.5 C) 97.6 F (36.4 C)   TempSrc: Oral Oral Oral   SpO2: 100% 99%    Weight:    116 kg  Height: 5' 2"  (1.575 m)       Intake/Output Summary (Last 24 hours) at 12/01/2019 0718 Last data filed at 12/01/2019 0700 Gross per 24 hour  Intake --  Output 1100 ml  Net -1100 ml   Last 3 Weights 12/01/2019 11/30/2019 11/29/2019  Weight (lbs) 255 lb 11.7 oz 253 lb 8.5 oz 257 lb 9.6 oz  Weight (kg) 116 kg 115 kg 116.847 kg      Telemetry  Personally Reviewed  ECG    n/a - Personally Reviewed  Physical Exam   GEN: No acute distress.   Neck: JVP is increased Cardiac: RRR, no murmurs Respiratory: Clear to auscultation bilaterally. GI: Soft, nontender, non-distended  MS:  Tr edema; No deformity.  Chronic skin changes  Neuro:  Nonfocal    Labs    High Sensitivity Troponin:   Recent Labs  Lab 11/24/19 1739 11/24/19 2020  TROPONINIHS 17 14      Chemistry Recent Labs  Lab 11/25/19 0622 11/25/19 0622 11/26/19 0355 11/27/19 0437 11/28/19 0024 11/29/19 0405 11/30/19 0454  NA 145   < > 142   < > 142 145 134*  K  4.1   < > 4.9   < > 4.1 4.2 4.8  CL 99   < > 99   < > 97* 99 102  CO2 37*   < > 33*   < > 32 39* 23  GLUCOSE 85   < > 112*   < > 80 77 238*  BUN 46*   < > 46*   < > 56* 50* 19  CREATININE 2.00*   < > 1.77*   < > 2.07* 1.84* 1.36*  CALCIUM 7.3*   < > 6.9*   < > 7.4* 7.6* 8.9  ALBUMIN 2.8*  --  2.8*  --   --   --   --   GFRNONAA 24*   < > 28*   < > 23* 26* 38*  GFRAA 28*   < > 32*   < > 26* 31* 44*  ANIONGAP 9   < > 10   < > 13 7 9    < > = values in this interval not displayed.     Hematology Recent Labs  Lab 11/28/19 0024 11/29/19 0405 11/30/19 1610  WBC 3.7* 3.9* 3.6*  RBC 2.81* 2.85* 2.55*  HGB 8.3* 8.5* 7.8*  HCT 30.2* 31.0* 27.5*  MCV 107.5* 108.8* 107.8*  MCH 29.5 29.8 30.6  MCHC 27.5* 27.4* 28.4*  RDW 16.4* 16.5* 15.9*  PLT 139* 146* 132*    BNP Recent Labs  Lab 11/24/19 1739 11/29/19 0405  BNP 589.6* 967.9*     DDimer No results for input(s): DDIMER in the last 168 hours.   Radiology    No results found.  Cardiac Studies    Patient Profile     74 y.o. female with a hx of HFpEF, Crohn's,wheelchair bound, PSVT, COPD with chronic respiratory failure on home O2, RBBB, hypothyroidism, HLD, CKD stage 3,chronic leg edema, esophageal stricture and possible malignancywho is being seen for the evaluation of shortness of breath.  Assessment & Plan    1. New diagnosis PAF Pt has been in and out of atrial fib   Rates not too high   B Blocker added  Unfortunately other medical problems preclude antiarrhythmic Rx (lung, renal) Would rate control With UC and anemia not a candidate for anticoagulation     2  Hx HFpEF   Volume is probably mildly increased   Difficult  With GI  Continue oral torsemide Limit salt intake   2. SOB - multifactorial,seen by pulmonary. Combination OSA/OHS/COPD  3. Anemia  Hgb 7.8      4  GI   Hx UC    Note plans for Vancomycin  F/U planned for [facility]    OK to d/c from cardiac standpoint   WIll arrange for outpt f/u   For  questions or updates, please contact [place] Please consult [email or web address] for contact info under        Signed, Dorris Carnes, MD  12/01/2019, 7:18 AM

## 2019-12-01 NOTE — Discharge Summary (Signed)
Physician Discharge Summary  Jamie Montoya:[id number] DOB: 1946-05-15 DOA: 11/24/2019  PCP: System, Pcp Not In  Admit date: 11/24/2019 Discharge date: 12/01/2019  Admitted From: home Disposition:  home  Recommendations for Outpatient Follow-up:  1. Follow up with PCP in 1-2 weeks 2. Follow-up with [facility] gastroenterology in 2 to 3 weeks  Home Health: PT Equipment/Devices: Wheelchair, walker  Discharge Condition: Stable CODE STATUS: Full code Diet recommendation: Heart healthy  HPI: Per admitting MD, Jamie Montoya is a 74 y.o. female with medical history significant for COPD with chronic hypoxemia on 2 L, chronic diastolic heart failure, history of SVT, hypertension, peripheral vascular disease with chronic venous insufficiency, ulcerative colitis, hypothyroidism, stage III CKD, hypothyroidism, and new adnexal mass concerning for malignancy who presents with concerns of acute worsening shortness of breath. Patient notes that for the past week she has had increased shortness of breath and gets "choked" with increased cough and increased sputum production.  Also has been feeling hot and cold although does not note any objective fever.  Feels heart palpitation but denies any chest pain.  Denies any orthopnea or PND.  Notes some worsening lower extremity edema but endorsed compliant with her 20 mg twice daily torsemide. Has noticed some decreased urine output.  Also noted wheezing.  Patient has hypothyroidism and states that she has been working with her PCP to adjust her levothyroxine since her hair has been falling out and has been having splitting of her nails.  Patient is mostly wheelchair-bound. ED Course:  She was afebrile, normotensive and required an increase of her home O2 up to 3 L. No leukocytosis, anemia with hemoglobin of 7.1.  Platelet of 129.  Creatinine elevated at 1.77 from a prior of 1.57 a month ago.  Unclear if her recent baseline is her creatinine has been fluctuating.  She has  hypocalcemia of 7.6 with no albumin lab noted. BNP of greater than 500 from a prior of greater than 2000. Troponin of 17 and 14.   Hospital Course / Discharge diagnoses: Principal Problem Acute on chronic hypoxic respiratory failure-due to COPD exacerbation symptomatic from.  PCCM has been consulted due to tenuous respiratory status, suspecting a combination of OSA/OHS/COPD with marked nocturnal desaturations. They recommend mobilizing, incentive spirometry, outpatient CPAP versus BiPAP evaluation (inpatient trial likely to have poor patient compliance). They recommend oxygen 3 L/min during the day and 5 L at night. Continue home regimen.Needs outpatient sleep study. Improved and stable.COPD exacerbation resolved.  Active Problems Symptomatic iron deficiency anemia-GI consulted, appreciate input.  No melena, no hematochezia but does have a history of pleurisy.  Per GI, case was discussed with the gastroenterologist at [facility] and it looks like she will have a colonoscopy over there upon discharge COPD exacerbation-resolved Acute on chronic diastolic CHF-status post IV Lasix initially, volume status overall difficult to assess due to body habitus.  Received a dose of metolazone on 6/5, now continue home torsemide.    Creatinine stable New onset paroxysmal A. fib-TSH normal, unable to anticoagulate per cardiology with significant anemia.  She was started on low-dose metoprolol 12.5 twice daily with improvement in heart rates, her blood pressure tolerates and will be discharged on that.  Avoid amiodarone due to underlying lung disease Acute kidney injury on chronic kidney disease stage IIIa-Baseline creatinine about 1.6, currently improved and better than baseline Ulcerative colitis with chronic diarrhea/C. difficile diarrhea -GI consulted, communicated directly with GI from [facility] and they plan to do colonoscopy over there Essential hypertension-soft blood pressures  at time but she seems to be tolerating  diuretics as well as beta-blockers Adnexal mass - As discussed in detail with Dr. Justice Rocher GI on 6/7: CT at [facility] 11/10/2019 showed increasing size of right adnexal mass. She was seen by [place] oncology clinic that day with elevated concern for primary GI etiology given elevated CEA: CA-125 ratio. [facility] contemplated colonoscopy with patient was hesitant them. Dr. Bryan Lemma discussed her case with Dr. Durene Romans the [facility] IBD clinic who will schedule colonoscopy at [facility] with the high risk procedure service. She has follow-up scheduled in the clinic on 12/28/2019 and plan for expedited colonoscopy. They agreed that she needs to recover from her current acute cardiopulmonary issues. She will need to follow with GYN oncology and GI/IBD service at [facility] who are well familiar with her case. Debility-wheelchair-bound at baseline. Reports 24/7 home care Hypothermia -Rectal temperature 94.7 x1 on 6/7 evening, asymptomatic, monitor vitals stable, unclear etiology.  Cortisol normal.  This is resolved.  Discharge Instructions   Allergies as of 12/01/2019      Reactions   Tape Rash      Medication List    TAKE these medications   albuterol 108 (90 Base) MCG/ACT inhaler Commonly known as: VENTOLIN HFA Inhale 1 puff into the lungs every 6 (six) hours as needed for wheezing or shortness of breath.   albuterol (2.5 MG/3ML) 0.083% nebulizer solution Commonly known as: PROVENTIL Take 3 mLs by nebulization 4 (four) times daily as needed for wheezing or shortness of breath.   Biotin 5000 MCG Tabs Take 1 tablet by mouth at bedtime.   ferrous sulfate 325 (65 FE) MG tablet Take 325 mg by mouth daily with breakfast.   levothyroxine 175 MCG tablet Commonly known as: SYNTHROID Take 175 mcg by mouth daily.   metoprolol tartrate 25 MG tablet Commonly known as: LOPRESSOR Take 0.5 tablets (12.5 mg total) by mouth 2 (two) times daily.   nystatin powder Commonly known as: MYCOSTATIN/NYSTOP Apply 1  application topically daily.   torsemide 20 MG tablet Commonly known as: DEMADEX Take 20 mg by mouth 2 (two) times daily.   vancomycin 50 mg/mL  oral solution Commonly known as: VANCOCIN Take 2.5 mLs (125 mg total) by mouth 4 (four) times daily for 6 days.      Follow-up Information    Dorothy Spark, MD Follow up in 4 week(s).   Specialty: Cardiology Contact information: [place] [address] [phone]           Consultations:  Cardiology  Gastroenterology  Procedures/Studies:  DG Chest 2 View  Result Date: 11/24/2019 CLINICAL DATA:  Shortness of breath EXAM: CHEST - 2 VIEW COMPARISON:  08/19/2019 FINDINGS: Cardiomegaly. Small right pleural effusion. Bibasilar atelectasis or infiltrates, right greater than left. No overt edema. No acute bony abnormality. IMPRESSION: Small right pleural effusion.  Bibasilar atelectasis or infiltrates. Cardiomegaly. Electronically Signed   By: Rolm Baptise M.D.   On: 11/24/2019 18:14   DG Chest Port 1 View  Result Date: 11/26/2019 CLINICAL DATA:  Hypoxia EXAM: PORTABLE CHEST 1 VIEW COMPARISON:  11/24/2019 FINDINGS: Cardiac enlargement. Vascular congestion with interstitial edema has progressed. No significant pleural effusion. Bibasilar atelectasis. IMPRESSION: Progression of bilateral airspace disease most compatible with interstitial edema. Electronically Signed   By: Franchot Gallo M.D.   On: 11/26/2019 08:39     Subjective: - no chest pain, shortness of breath, no abdominal pain, nausea or vomiting.   Discharge Exam: BP (!) 110/55  Pulse (!) 101   Temp 97.6 F (36.4 C) (Oral)   Resp 16   Ht 5' 2"  (1.575 m)   Wt 116 kg   SpO2 94%   BMI 46.77 kg/m   General: Pt is alert, awake, not in acute distress Cardiovascular: RRR, S1/S2 +, no rubs, no gallops Respiratory: CTA bilaterally, no wheezing, no rhonchi Abdominal: Soft, NT, ND, bowel sounds + Extremities: no edema, no cyanosis    The  results of significant diagnostics from this hospitalization (including imaging, microbiology, ancillary and laboratory) are listed below for reference.     Microbiology: Recent Results (from the past 240 hour(s))  SARS Coronavirus 2 by RT PCR (hospital order, performed in [facility] hospital lab) Nasopharyngeal Nasopharyngeal Swab     Status: None   Collection Time: 11/24/19 11:50 PM   Specimen: Nasopharyngeal Swab  Result Value Ref Range Status   SARS Coronavirus 2 NEGATIVE NEGATIVE Final    Comment: (NOTE) SARS-CoV-2 target nucleic acids are NOT DETECTED. The SARS-CoV-2 RNA is generally detectable in upper and lower respiratory specimens during the acute phase of infection. The lowest concentration of SARS-CoV-2 viral copies this assay can detect is 250 copies / mL. A negative result does not preclude SARS-CoV-2 infection and should not be used as the sole basis for treatment or other patient management decisions.  A negative result may occur with improper specimen collection / handling, submission of specimen other than nasopharyngeal swab, presence of viral mutation(s) within the areas targeted by this assay, and inadequate number of viral copies (<250 copies / mL). A negative result must be combined with clinical observations, patient history, and epidemiological information. Fact Sheet for Patients:   StrictlyIdeas.no Fact Sheet for Healthcare Providers: BankingDealers.co.za This test is not yet approved or cleared  by the [place] FDA and has been authorized for detection and/or diagnosis of SARS-CoV-2 by FDA under an Emergency Use Authorization (EUA).  This EUA will remain in effect (meaning this test can be used) for the duration of the COVID-19 declaration under Section 564(b)(1) of the Act, 21 U.S.C. section 360bbb-3(b)(1), unless the authorization is terminated or revoked sooner. Performed at [facility],  [place] [address] [place], [place] [address]   C Difficile Quick Screen w PCR reflex     Status: Abnormal   Collection Time: 11/25/19 11:59 PM   Specimen: STOOL  Result Value Ref Range Status   C Diff antigen POSITIVE (A) NEGATIVE Final   C Diff toxin NEGATIVE NEGATIVE Final   C Diff interpretation Results are indeterminate. See PCR results.  Final    Comment: Performed at [facility], [place] [address] [place], [place] [address]  C. Diff by PCR, Reflexed     Status: Abnormal   Collection Time: 11/25/19 11:59 PM  Result Value Ref Range Status   Toxigenic C. Difficile by PCR POSITIVE (A) NEGATIVE Final    Comment: Positive for toxigenic C. difficile with little to no toxin production. Only treat if clinical presentation suggests symptomatic illness. Performed at [facility], [place] [address], [place], [place] [address]   Culture, blood (Routine X 2) w Reflex to ID Panel     Status: None (Preliminary result)   Collection Time: 11/29/19  7:10 PM   Specimen: BLOOD  Result Value Ref Range Status   Specimen Description BLOOD RIGHT ANTECUBITAL  Final   Special Requests   Final    BOTTLES DRAWN AEROBIC AND ANAEROBIC Blood Culture adequate volume   Culture  Final    NO GROWTH 2 DAYS Performed at [facility], [place] [address] [place], [place] [address]    Report Status PENDING  Incomplete  Culture, blood (Routine X 2) w Reflex to ID Panel     Status: None (Preliminary result)   Collection Time: 11/29/19  7:14 PM   Specimen: BLOOD LEFT HAND  Result Value Ref Range Status   Specimen Description BLOOD LEFT HAND  Final   Special Requests   Final    BOTTLES DRAWN AEROBIC ONLY Blood Culture results may not be optimal due to an inadequate volume of blood received in culture bottles   Culture   Final    NO GROWTH 2 DAYS Performed at [facility], [place] [address], [place], [place] [address]    Report Status PENDING  Incomplete     Labs: Basic Metabolic Panel: Recent Labs    Lab 11/25/19 0622 11/25/19 0622 11/26/19 0355 11/27/19 0437 11/28/19 0024 11/29/19 0405 11/30/19 0454  NA 145   < > 142 146* 142 145 134*  K 4.1   < > 4.9 3.8 4.1 4.2 4.8  CL 99   < > 99 99 97* 99 102  CO2 37*   < > 33* 37* 32 39* 23  GLUCOSE 85   < > 112* 75 80 77 238*  BUN 46*   < > 46* 47* 56* 50* 19  CREATININE 2.00*   < > 1.77* 2.11* 2.07* 1.84* 1.36*  CALCIUM 7.3*   < > 6.9* 7.2* 7.4* 7.6* 8.9  MG 1.8  --  1.8  --   --   --   --   PHOS 4.2  --  4.5  --   --   --   --    < > = values in this interval not displayed.   Liver Function Tests: Recent Labs  Lab 11/25/19 0622 11/26/19 0355  ALBUMIN 2.8* 2.8*   CBC: Recent Labs  Lab 11/25/19 2359 11/27/19 0437 11/28/19 0024 11/29/19 0405 11/30/19 0454  WBC 4.3 2.5* 3.7* 3.9* 3.6*  HGB 8.0* 7.4* 8.3* 8.5* 7.8*  HCT 28.2* 27.0* 30.2* 31.0* 27.5*  MCV 105.6* 108.0* 107.5* 108.8* 107.8*  PLT 135* 120* 139* 146* 132*   CBG: Recent Labs  Lab 11/25/19 1607  GLUCAP 84   Hgb A1c No results for input(s): HGBA1C in the last 72 hours. Lipid Profile No results for input(s): CHOL, HDL, LDLCALC, TRIG, CHOLHDL, LDLDIRECT in the last 72 hours. Thyroid function studies No results for input(s): TSH, T4TOTAL, T3FREE, THYROIDAB in the last 72 hours.  Invalid input(s): FREET3 Urinalysis No results found for: COLORURINE, APPEARANCEUR, LABSPEC, PHURINE, GLUCOSEU, HGBUR, BILIRUBINUR, KETONESUR, PROTEINUR, UROBILINOGEN, NITRITE, LEUKOCYTESUR  FURTHER DISCHARGE INSTRUCTIONS:   Get Medicines reviewed and adjusted: Please take all your medications with you for your next visit with your Primary MD   Laboratory/radiological data: Please request your Primary MD to go over all hospital tests and procedure/radiological results at the follow up, please ask your Primary MD to get all Hospital records sent to his/her office.   In some cases, they will be blood work, cultures and biopsy results pending at the time of your discharge. Please  request that your primary care M.D. goes through all the records of your hospital data and follows up on these results.   Also Note the following: If you experience worsening of your admission symptoms, develop shortness of breath, life threatening emergency, suicidal or homicidal thoughts you must seek medical  attention immediately by calling 911 or calling your MD immediately  if symptoms less severe.   You must read complete instructions/literature along with all the possible adverse reactions/side effects for all the Medicines you take and that have been prescribed to you. Take any new Medicines after you have completely understood and accpet all the possible adverse reactions/side effects.    Do not drive when taking Pain medications or sleeping medications (Benzodaizepines)   Do not take more than prescribed Pain, Sleep and Anxiety Medications. It is not advisable to combine anxiety,sleep and pain medications without talking with your primary care practitioner   Special Instructions: If you have smoked or chewed Tobacco  in the last 2 yrs please stop smoking, stop any regular Alcohol  and or any Recreational drug use.   Wear Seat belts while driving.   Please note: You were cared for by a hospitalist during your hospital stay. Once you are discharged, your primary care physician will handle any further medical issues. Please note that NO REFILLS for any discharge medications will be authorized once you are discharged, as it is imperative that you return to your primary care physician (or establish a relationship with a primary care physician if you do not have one) for your post hospital discharge needs so that they can reassess your need for medications and monitor your lab values.  Time coordinating discharge: 40 minutes  SIGNED:  Marzetta Board, MD, PhD 12/01/2019, 11:05 AM

## 2019-12-02 ENCOUNTER — Telehealth: Payer: Self-pay | Admitting: Cardiology

## 2019-12-02 DIAGNOSIS — I5033 Acute on chronic diastolic (congestive) heart failure: Secondary | ICD-10-CM | POA: Diagnosis not present

## 2019-12-02 DIAGNOSIS — K5190 Ulcerative colitis, unspecified, without complications: Secondary | ICD-10-CM | POA: Diagnosis not present

## 2019-12-02 DIAGNOSIS — I480 Paroxysmal atrial fibrillation: Secondary | ICD-10-CM | POA: Diagnosis not present

## 2019-12-02 DIAGNOSIS — A0472 Enterocolitis due to Clostridium difficile, not specified as recurrent: Secondary | ICD-10-CM | POA: Diagnosis not present

## 2019-12-02 DIAGNOSIS — Z993 Dependence on wheelchair: Secondary | ICD-10-CM | POA: Diagnosis not present

## 2019-12-02 DIAGNOSIS — D509 Iron deficiency anemia, unspecified: Secondary | ICD-10-CM | POA: Diagnosis not present

## 2019-12-02 DIAGNOSIS — I130 Hypertensive heart and chronic kidney disease with heart failure and stage 1 through stage 4 chronic kidney disease, or unspecified chronic kidney disease: Secondary | ICD-10-CM | POA: Diagnosis not present

## 2019-12-02 DIAGNOSIS — Z9981 Dependence on supplemental oxygen: Secondary | ICD-10-CM | POA: Diagnosis not present

## 2019-12-02 DIAGNOSIS — K222 Esophageal obstruction: Secondary | ICD-10-CM | POA: Diagnosis not present

## 2019-12-02 DIAGNOSIS — I739 Peripheral vascular disease, unspecified: Secondary | ICD-10-CM | POA: Diagnosis not present

## 2019-12-02 DIAGNOSIS — I872 Venous insufficiency (chronic) (peripheral): Secondary | ICD-10-CM | POA: Diagnosis not present

## 2019-12-02 DIAGNOSIS — D696 Thrombocytopenia, unspecified: Secondary | ICD-10-CM | POA: Diagnosis not present

## 2019-12-02 DIAGNOSIS — E039 Hypothyroidism, unspecified: Secondary | ICD-10-CM | POA: Diagnosis not present

## 2019-12-02 DIAGNOSIS — J9621 Acute and chronic respiratory failure with hypoxia: Secondary | ICD-10-CM | POA: Diagnosis not present

## 2019-12-02 DIAGNOSIS — J449 Chronic obstructive pulmonary disease, unspecified: Secondary | ICD-10-CM | POA: Diagnosis not present

## 2019-12-02 DIAGNOSIS — E785 Hyperlipidemia, unspecified: Secondary | ICD-10-CM | POA: Diagnosis not present

## 2019-12-02 DIAGNOSIS — N1831 Chronic kidney disease, stage 3a: Secondary | ICD-10-CM | POA: Diagnosis not present

## 2019-12-02 NOTE — Telephone Encounter (Signed)
New Message    Pt c/o medication issue:  1. Name of Medication: vancomycin (VANCOCIN) 50 mg/mL oral solution  2. How are you currently taking this medication (dosage and times per day)? Not yet taking   3. Are you having a reaction (difficulty breathing--STAT)? No   4. What is your medication issue? [name] is calling for the pt and says the pt was prescribed this medication at the hospital and her insurance is not covering the prescription She is wondering if Dr Meda Coffee can prescribe an alternative medication    Please call

## 2019-12-02 NOTE — Telephone Encounter (Signed)
Jamie Montoya, pts caregiver is calling (pt gave verbal consent for her to speak with [place]) for some guidance about pts Vancomycin she was discharged home with, from recent hospital admission. Jamie Montoya states it appears she is on the Vanc for GI issues, but wanted to make sure, for the pts insurance company is denying coverage of this medication for the pt.  Informed Jamie Montoya that based on hospital progress notes and discharge summary, it does appear the pt was given Vancomycin during the hospital stay and discharged with, for GI issues, like UC, and c-diff.  Jamie Montoya is reaching for guidance for who to contact about this medication, so they can work on getting this approved through the pts insurance carrier.  Asked Jamie Montoya if she has called the pts GI MD at [facility] yet, to arrange her post-hospital follow-up appts and colonoscopy with them.  Jamie Montoya at [facility] is her GI MD. Jamie Montoya states she was getting ready to call them this morning, and get the following arranged with their office.  Jamie Montoya to go ahead and give them a call, get her follow-up's scheduled with them, and ask them to go ahead and do a prior auth on the pts Vancomycin for approval, and/or advise on another cost effective regimen for the pt to take, so that she continuously has the antibiotic she needs for her GI issues.  Informed Jamie Montoya that we will see the pt as scheduled for 6/23 for post-hospital follow-up with our office.  Jamie Montoya to reach out to Jamie Montoya, pts Gastroenterologist at [facility] now, for further assistance with her Vancomycin.  Kim verbalized understanding and agrees with this plan.  Jamie Montoya was very appreciative for all the assistance, and states she will call their office now. Will send this message to Dr. Meda Coffee as a general FYI.

## 2019-12-04 LAB — CULTURE, BLOOD (ROUTINE X 2)
Culture: NO GROWTH
Culture: NO GROWTH
Special Requests: ADEQUATE

## 2019-12-06 ENCOUNTER — Telehealth: Payer: Self-pay | Admitting: Internal Medicine

## 2019-12-06 DIAGNOSIS — N1831 Chronic kidney disease, stage 3a: Secondary | ICD-10-CM | POA: Diagnosis not present

## 2019-12-06 DIAGNOSIS — I480 Paroxysmal atrial fibrillation: Secondary | ICD-10-CM | POA: Diagnosis not present

## 2019-12-06 DIAGNOSIS — J9621 Acute and chronic respiratory failure with hypoxia: Secondary | ICD-10-CM | POA: Diagnosis not present

## 2019-12-06 DIAGNOSIS — J449 Chronic obstructive pulmonary disease, unspecified: Secondary | ICD-10-CM | POA: Diagnosis not present

## 2019-12-06 DIAGNOSIS — Z993 Dependence on wheelchair: Secondary | ICD-10-CM | POA: Diagnosis not present

## 2019-12-06 DIAGNOSIS — I872 Venous insufficiency (chronic) (peripheral): Secondary | ICD-10-CM | POA: Diagnosis not present

## 2019-12-06 DIAGNOSIS — A0472 Enterocolitis due to Clostridium difficile, not specified as recurrent: Secondary | ICD-10-CM | POA: Diagnosis not present

## 2019-12-06 DIAGNOSIS — D696 Thrombocytopenia, unspecified: Secondary | ICD-10-CM | POA: Diagnosis not present

## 2019-12-06 DIAGNOSIS — I130 Hypertensive heart and chronic kidney disease with heart failure and stage 1 through stage 4 chronic kidney disease, or unspecified chronic kidney disease: Secondary | ICD-10-CM | POA: Diagnosis not present

## 2019-12-06 DIAGNOSIS — K222 Esophageal obstruction: Secondary | ICD-10-CM | POA: Diagnosis not present

## 2019-12-06 DIAGNOSIS — I5033 Acute on chronic diastolic (congestive) heart failure: Secondary | ICD-10-CM | POA: Diagnosis not present

## 2019-12-06 DIAGNOSIS — E039 Hypothyroidism, unspecified: Secondary | ICD-10-CM | POA: Diagnosis not present

## 2019-12-06 DIAGNOSIS — K5190 Ulcerative colitis, unspecified, without complications: Secondary | ICD-10-CM | POA: Diagnosis not present

## 2019-12-06 DIAGNOSIS — I739 Peripheral vascular disease, unspecified: Secondary | ICD-10-CM | POA: Diagnosis not present

## 2019-12-06 DIAGNOSIS — Z9981 Dependence on supplemental oxygen: Secondary | ICD-10-CM | POA: Diagnosis not present

## 2019-12-06 DIAGNOSIS — D509 Iron deficiency anemia, unspecified: Secondary | ICD-10-CM | POA: Diagnosis not present

## 2019-12-06 DIAGNOSIS — E785 Hyperlipidemia, unspecified: Secondary | ICD-10-CM | POA: Diagnosis not present

## 2019-12-06 NOTE — Telephone Encounter (Signed)
As stated in my note PAP would have to be figured out on outpatient basis, can you get her in to see Dr. Ander Slade for sleep eval and PSG; thank you!

## 2019-12-06 NOTE — Telephone Encounter (Signed)
Dr. Tamala Julian, [employer] stated you saw this patient in the hospital, admit 11/24/2019. She has never been seen in our office. She had a trelogy vent ordered but does not qualify because she has never had a PFT. Please advise, should we see her? Does she need a PFT?

## 2019-12-07 NOTE — Telephone Encounter (Signed)
Well okay then I guess she does not want our help.  Thanks, Linna Hoff

## 2019-12-07 NOTE — Telephone Encounter (Signed)
Patient contacted and is refusing to be seen by sleep doctors. She is not interested in doing any sleep studies. Will forward to Dr. Tamala Julian for review.

## 2019-12-08 ENCOUNTER — Telehealth: Payer: Self-pay | Admitting: Cardiology

## 2019-12-08 DIAGNOSIS — Z9981 Dependence on supplemental oxygen: Secondary | ICD-10-CM | POA: Diagnosis not present

## 2019-12-08 DIAGNOSIS — N1831 Chronic kidney disease, stage 3a: Secondary | ICD-10-CM | POA: Diagnosis not present

## 2019-12-08 DIAGNOSIS — I739 Peripheral vascular disease, unspecified: Secondary | ICD-10-CM | POA: Diagnosis not present

## 2019-12-08 DIAGNOSIS — D696 Thrombocytopenia, unspecified: Secondary | ICD-10-CM | POA: Diagnosis not present

## 2019-12-08 DIAGNOSIS — I872 Venous insufficiency (chronic) (peripheral): Secondary | ICD-10-CM | POA: Diagnosis not present

## 2019-12-08 DIAGNOSIS — I480 Paroxysmal atrial fibrillation: Secondary | ICD-10-CM | POA: Diagnosis not present

## 2019-12-08 DIAGNOSIS — K5190 Ulcerative colitis, unspecified, without complications: Secondary | ICD-10-CM | POA: Diagnosis not present

## 2019-12-08 DIAGNOSIS — K222 Esophageal obstruction: Secondary | ICD-10-CM | POA: Diagnosis not present

## 2019-12-08 DIAGNOSIS — E039 Hypothyroidism, unspecified: Secondary | ICD-10-CM | POA: Diagnosis not present

## 2019-12-08 DIAGNOSIS — I5033 Acute on chronic diastolic (congestive) heart failure: Secondary | ICD-10-CM | POA: Diagnosis not present

## 2019-12-08 DIAGNOSIS — I130 Hypertensive heart and chronic kidney disease with heart failure and stage 1 through stage 4 chronic kidney disease, or unspecified chronic kidney disease: Secondary | ICD-10-CM | POA: Diagnosis not present

## 2019-12-08 DIAGNOSIS — D509 Iron deficiency anemia, unspecified: Secondary | ICD-10-CM | POA: Diagnosis not present

## 2019-12-08 DIAGNOSIS — R52 Pain, unspecified: Secondary | ICD-10-CM | POA: Diagnosis not present

## 2019-12-08 DIAGNOSIS — J449 Chronic obstructive pulmonary disease, unspecified: Secondary | ICD-10-CM | POA: Diagnosis not present

## 2019-12-08 DIAGNOSIS — I5020 Unspecified systolic (congestive) heart failure: Secondary | ICD-10-CM | POA: Diagnosis not present

## 2019-12-08 DIAGNOSIS — Z993 Dependence on wheelchair: Secondary | ICD-10-CM | POA: Diagnosis not present

## 2019-12-08 DIAGNOSIS — J9621 Acute and chronic respiratory failure with hypoxia: Secondary | ICD-10-CM | POA: Diagnosis not present

## 2019-12-08 DIAGNOSIS — E785 Hyperlipidemia, unspecified: Secondary | ICD-10-CM | POA: Diagnosis not present

## 2019-12-08 DIAGNOSIS — A0472 Enterocolitis due to Clostridium difficile, not specified as recurrent: Secondary | ICD-10-CM | POA: Diagnosis not present

## 2019-12-08 DIAGNOSIS — M6281 Muscle weakness (generalized): Secondary | ICD-10-CM | POA: Diagnosis not present

## 2019-12-08 NOTE — Telephone Encounter (Signed)
Pts caregiver and PT is calling to report to Dr. Meda Coffee, that the pt has had an increase in weight gain in a short time span. Caregiver reports that the pt weighed 250 lbs yesterday, as dry weight, and today as her dry weight, she is weighing 259 lbs.  This increase occurred in less than a 24 hour time period.  Caregiver states she is always sob, and has remained this way, for she has underlying pulmonary issues, as well as CHF.  Caregiver reports that the pts lower extremities are still very swollen, bilaterally. Caregiver reports that she has a decrease in urine output, but was unable to measure this today.  She just notices she has not had the urge to go, or requested to be toileted.  She states her HR-100 irregular, and SPO2- 95 % RA.  She states the pt continues to cough, but this is ongoing.  Pt is not complaining of any chest pain, dizziness, N/V, diaphoresis, pre-syncopal or syncopal episodes.  Caregiver reports that she does have DOE.  Caregiver states her nebulizer she uses on an as needed basis, is  Broke at this time, but they have ordered a replacement piece, and this will be coming to the pts home soon. Caregiver and pt both agree that she should go to the ER for symptoms and rapid increase of weight, with little urine output.  Caregiver states the pt is refusing to go to the ER tonight, and states "she will go in the morning."  Highly advised them both to go now.  Caregiver did state that the pt took her torsemide as prescribed, as well as an additional 20 mg a couple hours ago, and she has not urinated since then.  Advised both parties that the pt should go to the ER now for further evaluation and treatment.  Caregiver states the pt is refusing at this time, and will go in the AM. Advised the caregiver and pt if her symptoms worsen between now and tomorrow morning, then she should call 911 or seek immediate medical attention.  Pt education provided on what signs/symptoms warrant  immediate medical attention.  Advised the caregiver she should take her meds as prescribed.  Monitor her urine output if possible, limit salt in her diet, elevate her extremities at rest, and continue with dry weights.  Informed the pts caregiver that I will route this message to Dr. Meda Coffee to further review and advise on as needed.  Informed the caregiver if she has further recommendations, we will call thereafter.  Caregiver verbalized understanding and agrees with this plan.

## 2019-12-08 NOTE — Telephone Encounter (Signed)
Pt c/o swelling: STAT is pt has developed SOB within 24 hours  1) How much weight have you gained and in what time span? 8 lbs in 1 day   2) If swelling, where is the swelling located? All over   3) Are you currently taking a fluid pill? Yes   4) Are you currently SOB? No   5) Do you have a log of your daily weights (if so, list)? 10 lbs since Sunday 8 lbs since yesterday   6) Have you gained 3 pounds in a day or 5 pounds in a week? Yes both   7) Have you traveled recently? No

## 2019-12-08 NOTE — Telephone Encounter (Signed)
Daleen Snook from [facility] called to report patient had an 8lb weight gain since yesterday, patient has A-fib so her HR was a littler irregular as well, she had decrease urine output. She also had some SOB and edema.  The caregiver did state that she doesn't take her lasix when they have to go out, she is wondering if that makes a difference.  Please call patient and Daleen Snook as well.

## 2019-12-08 NOTE — Telephone Encounter (Signed)
See open note in regards to this issue from today at around the same time.

## 2019-12-09 DIAGNOSIS — Z993 Dependence on wheelchair: Secondary | ICD-10-CM | POA: Diagnosis not present

## 2019-12-09 DIAGNOSIS — D509 Iron deficiency anemia, unspecified: Secondary | ICD-10-CM | POA: Diagnosis not present

## 2019-12-09 DIAGNOSIS — J449 Chronic obstructive pulmonary disease, unspecified: Secondary | ICD-10-CM | POA: Diagnosis not present

## 2019-12-09 DIAGNOSIS — J9621 Acute and chronic respiratory failure with hypoxia: Secondary | ICD-10-CM | POA: Diagnosis not present

## 2019-12-09 DIAGNOSIS — E785 Hyperlipidemia, unspecified: Secondary | ICD-10-CM | POA: Diagnosis not present

## 2019-12-09 DIAGNOSIS — E039 Hypothyroidism, unspecified: Secondary | ICD-10-CM | POA: Diagnosis not present

## 2019-12-09 DIAGNOSIS — D696 Thrombocytopenia, unspecified: Secondary | ICD-10-CM | POA: Diagnosis not present

## 2019-12-09 DIAGNOSIS — A0472 Enterocolitis due to Clostridium difficile, not specified as recurrent: Secondary | ICD-10-CM | POA: Diagnosis not present

## 2019-12-09 DIAGNOSIS — K222 Esophageal obstruction: Secondary | ICD-10-CM | POA: Diagnosis not present

## 2019-12-09 DIAGNOSIS — I739 Peripheral vascular disease, unspecified: Secondary | ICD-10-CM | POA: Diagnosis not present

## 2019-12-09 DIAGNOSIS — K5190 Ulcerative colitis, unspecified, without complications: Secondary | ICD-10-CM | POA: Diagnosis not present

## 2019-12-09 DIAGNOSIS — I130 Hypertensive heart and chronic kidney disease with heart failure and stage 1 through stage 4 chronic kidney disease, or unspecified chronic kidney disease: Secondary | ICD-10-CM | POA: Diagnosis not present

## 2019-12-09 DIAGNOSIS — I5033 Acute on chronic diastolic (congestive) heart failure: Secondary | ICD-10-CM | POA: Diagnosis not present

## 2019-12-09 DIAGNOSIS — Z9981 Dependence on supplemental oxygen: Secondary | ICD-10-CM | POA: Diagnosis not present

## 2019-12-09 DIAGNOSIS — N1831 Chronic kidney disease, stage 3a: Secondary | ICD-10-CM | POA: Diagnosis not present

## 2019-12-09 DIAGNOSIS — I872 Venous insufficiency (chronic) (peripheral): Secondary | ICD-10-CM | POA: Diagnosis not present

## 2019-12-09 DIAGNOSIS — I480 Paroxysmal atrial fibrillation: Secondary | ICD-10-CM | POA: Diagnosis not present

## 2019-12-13 DIAGNOSIS — Z993 Dependence on wheelchair: Secondary | ICD-10-CM | POA: Diagnosis not present

## 2019-12-13 DIAGNOSIS — I130 Hypertensive heart and chronic kidney disease with heart failure and stage 1 through stage 4 chronic kidney disease, or unspecified chronic kidney disease: Secondary | ICD-10-CM | POA: Diagnosis not present

## 2019-12-13 DIAGNOSIS — I5033 Acute on chronic diastolic (congestive) heart failure: Secondary | ICD-10-CM | POA: Diagnosis not present

## 2019-12-13 DIAGNOSIS — I739 Peripheral vascular disease, unspecified: Secondary | ICD-10-CM | POA: Diagnosis not present

## 2019-12-13 DIAGNOSIS — D696 Thrombocytopenia, unspecified: Secondary | ICD-10-CM | POA: Diagnosis not present

## 2019-12-13 DIAGNOSIS — E785 Hyperlipidemia, unspecified: Secondary | ICD-10-CM | POA: Diagnosis not present

## 2019-12-13 DIAGNOSIS — J449 Chronic obstructive pulmonary disease, unspecified: Secondary | ICD-10-CM | POA: Diagnosis not present

## 2019-12-13 DIAGNOSIS — N1831 Chronic kidney disease, stage 3a: Secondary | ICD-10-CM | POA: Diagnosis not present

## 2019-12-13 DIAGNOSIS — D509 Iron deficiency anemia, unspecified: Secondary | ICD-10-CM | POA: Diagnosis not present

## 2019-12-13 DIAGNOSIS — J9621 Acute and chronic respiratory failure with hypoxia: Secondary | ICD-10-CM | POA: Diagnosis not present

## 2019-12-13 DIAGNOSIS — K222 Esophageal obstruction: Secondary | ICD-10-CM | POA: Diagnosis not present

## 2019-12-13 DIAGNOSIS — I872 Venous insufficiency (chronic) (peripheral): Secondary | ICD-10-CM | POA: Diagnosis not present

## 2019-12-13 DIAGNOSIS — E039 Hypothyroidism, unspecified: Secondary | ICD-10-CM | POA: Diagnosis not present

## 2019-12-13 DIAGNOSIS — I480 Paroxysmal atrial fibrillation: Secondary | ICD-10-CM | POA: Diagnosis not present

## 2019-12-13 DIAGNOSIS — A0472 Enterocolitis due to Clostridium difficile, not specified as recurrent: Secondary | ICD-10-CM | POA: Diagnosis not present

## 2019-12-13 DIAGNOSIS — Z9981 Dependence on supplemental oxygen: Secondary | ICD-10-CM | POA: Diagnosis not present

## 2019-12-13 DIAGNOSIS — K5190 Ulcerative colitis, unspecified, without complications: Secondary | ICD-10-CM | POA: Diagnosis not present

## 2019-12-15 ENCOUNTER — Ambulatory Visit: Payer: Medicare Other | Admitting: Cardiology

## 2019-12-15 ENCOUNTER — Other Ambulatory Visit: Payer: Self-pay

## 2019-12-15 ENCOUNTER — Encounter: Payer: Self-pay | Admitting: Cardiology

## 2019-12-15 ENCOUNTER — Other Ambulatory Visit: Payer: Medicare Other

## 2019-12-15 VITALS — BP 128/70 | HR 60 | Ht 64.0 in | Wt 257.0 lb

## 2019-12-15 DIAGNOSIS — R600 Localized edema: Secondary | ICD-10-CM | POA: Diagnosis not present

## 2019-12-15 DIAGNOSIS — I5032 Chronic diastolic (congestive) heart failure: Secondary | ICD-10-CM | POA: Diagnosis not present

## 2019-12-15 DIAGNOSIS — N1831 Chronic kidney disease, stage 3a: Secondary | ICD-10-CM

## 2019-12-15 DIAGNOSIS — I5030 Unspecified diastolic (congestive) heart failure: Secondary | ICD-10-CM | POA: Diagnosis not present

## 2019-12-15 DIAGNOSIS — R269 Unspecified abnormalities of gait and mobility: Secondary | ICD-10-CM | POA: Diagnosis not present

## 2019-12-15 DIAGNOSIS — I82499 Acute embolism and thrombosis of other specified deep vein of unspecified lower extremity: Secondary | ICD-10-CM | POA: Diagnosis not present

## 2019-12-15 DIAGNOSIS — J449 Chronic obstructive pulmonary disease, unspecified: Secondary | ICD-10-CM | POA: Diagnosis not present

## 2019-12-15 DIAGNOSIS — G4733 Obstructive sleep apnea (adult) (pediatric): Secondary | ICD-10-CM | POA: Diagnosis not present

## 2019-12-15 DIAGNOSIS — I5033 Acute on chronic diastolic (congestive) heart failure: Secondary | ICD-10-CM | POA: Diagnosis not present

## 2019-12-15 MED ORDER — METOLAZONE 2.5 MG PO TABS
2.5000 mg | ORAL_TABLET | Freq: Every day | ORAL | 0 refills | Status: DC
Start: 2019-12-15 — End: 2020-04-24

## 2019-12-15 MED ORDER — ALBUTEROL SULFATE HFA 108 (90 BASE) MCG/ACT IN AERS
2.0000 | INHALATION_SPRAY | Freq: Four times a day (QID) | RESPIRATORY_TRACT | 2 refills | Status: DC | PRN
Start: 2019-12-15 — End: 2021-06-14

## 2019-12-15 MED ORDER — METOLAZONE 2.5 MG PO TABS
2.5000 mg | ORAL_TABLET | Freq: Every day | ORAL | 1 refills | Status: DC
Start: 2019-12-15 — End: 2019-12-15

## 2019-12-15 NOTE — Patient Instructions (Addendum)
Medication Instructions:   START TAKING METOLAZONE 2.5 MG BY MOUTH DAILY--TAKE 30 MINS BEFORE YOUR DOSE OF TORSEMIDE  PLEASE USE YOUR ALBUTEROL INHALER 2 PUFFS AS NEEDED EVERY 6 HOURS FOR SHORTNESS OF BREATH AND WHEEZING  *If you need a refill on your cardiac medications before your next appointment, please call your pharmacy*  Lab Work:  IN 2 WEEKS--SAME DAY AS YOUR 2 WEEK FOLLOW-UP WITH AN APP IN THE OFFICE--WE WILL CHECK BMET AND PRO-BNP AT THAT TIME.   If you have labs (blood work) drawn today and your tests are completely normal, you will receive your results only by: Marland Kitchen MyChart Message (if you have MyChart) OR . A paper copy in the mail If you have any lab test that is abnormal or we need to change your treatment, we will call you to review the results.   Follow-Up:  2 WEEKS WITH AN EXTENDER IN THE OFFICE--OK TO PUT ADD PER DR. Meda Coffee AND Chariti Havel--PLEASE SCHEDULE LAB APPOINTMENT SAME DAY AS THIS APPOINTMENT
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
## 2019-12-20 DIAGNOSIS — J9611 Chronic respiratory failure with hypoxia: Secondary | ICD-10-CM | POA: Diagnosis not present

## 2019-12-20 DIAGNOSIS — Z79899 Other long term (current) drug therapy: Secondary | ICD-10-CM | POA: Diagnosis not present

## 2019-12-20 DIAGNOSIS — E039 Hypothyroidism, unspecified: Secondary | ICD-10-CM | POA: Diagnosis not present

## 2019-12-20 DIAGNOSIS — I5030 Unspecified diastolic (congestive) heart failure: Secondary | ICD-10-CM | POA: Diagnosis not present

## 2019-12-20 DIAGNOSIS — J449 Chronic obstructive pulmonary disease, unspecified: Secondary | ICD-10-CM | POA: Diagnosis not present

## 2019-12-20 DIAGNOSIS — I480 Paroxysmal atrial fibrillation: Secondary | ICD-10-CM | POA: Diagnosis not present
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
## 2019-12-23 ENCOUNTER — Telehealth: Payer: Self-pay | Admitting: Cardiology

## 2019-12-23 DIAGNOSIS — I5033 Acute on chronic diastolic (congestive) heart failure: Secondary | ICD-10-CM | POA: Diagnosis not present

## 2019-12-23 DIAGNOSIS — I480 Paroxysmal atrial fibrillation: Secondary | ICD-10-CM | POA: Diagnosis not present

## 2019-12-23 DIAGNOSIS — E785 Hyperlipidemia, unspecified: Secondary | ICD-10-CM | POA: Diagnosis not present

## 2019-12-23 DIAGNOSIS — Z9981 Dependence on supplemental oxygen: Secondary | ICD-10-CM | POA: Diagnosis not present

## 2019-12-23 DIAGNOSIS — A0472 Enterocolitis due to Clostridium difficile, not specified as recurrent: Secondary | ICD-10-CM | POA: Diagnosis not present

## 2019-12-23 DIAGNOSIS — Z993 Dependence on wheelchair: Secondary | ICD-10-CM | POA: Diagnosis not present

## 2019-12-23 DIAGNOSIS — E039 Hypothyroidism, unspecified: Secondary | ICD-10-CM | POA: Diagnosis not present

## 2019-12-23 DIAGNOSIS — D696 Thrombocytopenia, unspecified: Secondary | ICD-10-CM | POA: Diagnosis not present

## 2019-12-23 DIAGNOSIS — N1831 Chronic kidney disease, stage 3a: Secondary | ICD-10-CM | POA: Diagnosis not present

## 2019-12-23 DIAGNOSIS — I130 Hypertensive heart and chronic kidney disease with heart failure and stage 1 through stage 4 chronic kidney disease, or unspecified chronic kidney disease: Secondary | ICD-10-CM | POA: Diagnosis not present

## 2019-12-23 DIAGNOSIS — I739 Peripheral vascular disease, unspecified: Secondary | ICD-10-CM | POA: Diagnosis not present

## 2019-12-23 DIAGNOSIS — I872 Venous insufficiency (chronic) (peripheral): Secondary | ICD-10-CM | POA: Diagnosis not present

## 2019-12-23 DIAGNOSIS — D509 Iron deficiency anemia, unspecified: Secondary | ICD-10-CM | POA: Diagnosis not present

## 2019-12-23 DIAGNOSIS — K222 Esophageal obstruction: Secondary | ICD-10-CM | POA: Diagnosis not present

## 2019-12-23 DIAGNOSIS — J9621 Acute and chronic respiratory failure with hypoxia: Secondary | ICD-10-CM | POA: Diagnosis not present

## 2019-12-23 DIAGNOSIS — K5190 Ulcerative colitis, unspecified, without complications: Secondary | ICD-10-CM | POA: Diagnosis not present

## 2019-12-23 DIAGNOSIS — J449 Chronic obstructive pulmonary disease, unspecified: Secondary | ICD-10-CM | POA: Diagnosis not present

## 2019-12-23 NOTE — Telephone Encounter (Signed)
New Message   Pt c/o medication issue:  1. Name of Medication: metolazone (ZAROXOLYN) 2.5 MG tablet And torsemide (DEMADEX) 20 MG tablet   2. How are you currently taking this medication (dosage and times per day)?   3. Are you having a reaction (difficulty breathing--STAT)?   4. What is your medication issue? Alisha with [facility] is calling because the patients caregiver want to know how she should be taking medication. I did advise that the instructions says take the metolazone 30 minutes prior to the torsemide.Lars Mage is aware but the caregiver insist on hearing it from the providers office.

## 2019-12-23 NOTE — Telephone Encounter (Signed)
Called and spoke to patient. Instructed patient to take her metolazone 30 minutes before her torsemide. Her home health aide was there and it was communicated to her as well.

## 2019-12-28 DIAGNOSIS — Z993 Dependence on wheelchair: Secondary | ICD-10-CM | POA: Diagnosis not present

## 2019-12-28 DIAGNOSIS — A0472 Enterocolitis due to Clostridium difficile, not specified as recurrent: Secondary | ICD-10-CM | POA: Diagnosis not present

## 2019-12-28 DIAGNOSIS — K5190 Ulcerative colitis, unspecified, without complications: Secondary | ICD-10-CM | POA: Diagnosis not present

## 2019-12-28 DIAGNOSIS — K222 Esophageal obstruction: Secondary | ICD-10-CM | POA: Diagnosis not present

## 2019-12-28 DIAGNOSIS — D509 Iron deficiency anemia, unspecified: Secondary | ICD-10-CM | POA: Diagnosis not present

## 2019-12-28 DIAGNOSIS — I872 Venous insufficiency (chronic) (peripheral): Secondary | ICD-10-CM | POA: Diagnosis not present

## 2019-12-28 DIAGNOSIS — J449 Chronic obstructive pulmonary disease, unspecified: Secondary | ICD-10-CM | POA: Diagnosis not present

## 2019-12-28 DIAGNOSIS — I130 Hypertensive heart and chronic kidney disease with heart failure and stage 1 through stage 4 chronic kidney disease, or unspecified chronic kidney disease: Secondary | ICD-10-CM | POA: Diagnosis not present

## 2019-12-28 DIAGNOSIS — E785 Hyperlipidemia, unspecified: Secondary | ICD-10-CM | POA: Diagnosis not present

## 2019-12-28 DIAGNOSIS — I480 Paroxysmal atrial fibrillation: Secondary | ICD-10-CM | POA: Diagnosis not present

## 2019-12-28 DIAGNOSIS — E039 Hypothyroidism, unspecified: Secondary | ICD-10-CM | POA: Diagnosis not present

## 2019-12-28 DIAGNOSIS — J9621 Acute and chronic respiratory failure with hypoxia: Secondary | ICD-10-CM | POA: Diagnosis not present

## 2019-12-28 DIAGNOSIS — N1831 Chronic kidney disease, stage 3a: Secondary | ICD-10-CM | POA: Diagnosis not present

## 2019-12-28 DIAGNOSIS — D696 Thrombocytopenia, unspecified: Secondary | ICD-10-CM | POA: Diagnosis not present

## 2019-12-28 DIAGNOSIS — I739 Peripheral vascular disease, unspecified: Secondary | ICD-10-CM | POA: Diagnosis not present

## 2019-12-28 DIAGNOSIS — Z9981 Dependence on supplemental oxygen: Secondary | ICD-10-CM | POA: Diagnosis not present

## 2019-12-28 DIAGNOSIS — I5033 Acute on chronic diastolic (congestive) heart failure: Secondary | ICD-10-CM | POA: Diagnosis not present

## 2019-12-29 ENCOUNTER — Telehealth: Payer: Self-pay | Admitting: Cardiology

## 2019-12-29 MED ORDER — METOPROLOL TARTRATE 25 MG PO TABS: 12.5000 mg | ORAL_TABLET | Freq: Two times a day (BID) | ORAL | 3 refills | Status: DC

## 2019-12-29 MED ORDER — METOPROLOL TARTRATE 25 MG PO TABS: 12.5000 mg | ORAL_TABLET | Freq: Two times a day (BID) | ORAL | 0 refills | Status: DC

## 2019-12-29 NOTE — Telephone Encounter (Signed)
*  STAT* If patient is at the pharmacy, call can be transferred to refill team.   1. Which medications need to be refilled? (please list name of each medication and dose if known) metoprolol tartrate (LOPRESSOR) 25 MG tablet  2. Which pharmacy/location (including street and city if local pharmacy) is medication to be sent to? [id number] [place] [address]. Do they need a 30 day or 90 day supply? 30 day and a 90 day from oputm RX

## 2019-12-29 NOTE — Telephone Encounter (Signed)
Pt's medication metoprolol was sent to pt's local pharmacy and to pt's mail order pharmacy as requested. Confirmation received.

## 2019-12-31 DIAGNOSIS — I5030 Unspecified diastolic (congestive) heart failure: Secondary | ICD-10-CM | POA: Diagnosis not present

## 2019-12-31 DIAGNOSIS — J449 Chronic obstructive pulmonary disease, unspecified: Secondary | ICD-10-CM | POA: Diagnosis not present

## 2019-12-31 DIAGNOSIS — G4733 Obstructive sleep apnea (adult) (pediatric): Secondary | ICD-10-CM | POA: Diagnosis not present

## 2019-12-31 DIAGNOSIS — I82499 Acute embolism and thrombosis of other specified deep vein of unspecified lower extremity: Secondary | ICD-10-CM | POA: Diagnosis not present

## 2019-12-31 DIAGNOSIS — R269 Unspecified abnormalities of gait and mobility: Secondary | ICD-10-CM | POA: Diagnosis not present

## 2020-01-04 ENCOUNTER — Other Ambulatory Visit: Payer: Self-pay | Admitting: Physician Assistant

## 2020-01-05 ENCOUNTER — Other Ambulatory Visit: Payer: Medicare Other

## 2020-01-05 ENCOUNTER — Ambulatory Visit: Payer: Medicare Other | Admitting: Nurse Practitioner

## 2020-01-05 ENCOUNTER — Other Ambulatory Visit: Payer: Self-pay

## 2020-01-05 ENCOUNTER — Encounter: Payer: Self-pay | Admitting: Nurse Practitioner

## 2020-01-05 ENCOUNTER — Telehealth: Payer: Self-pay | Admitting: *Deleted

## 2020-01-05 VITALS — BP 120/60 | HR 61 | Ht 64.0 in | Wt 245.0 lb

## 2020-01-05 DIAGNOSIS — I5033 Acute on chronic diastolic (congestive) heart failure: Secondary | ICD-10-CM

## 2020-01-05 DIAGNOSIS — I5032 Chronic diastolic (congestive) heart failure: Secondary | ICD-10-CM

## 2020-01-05 DIAGNOSIS — R600 Localized edema: Secondary | ICD-10-CM | POA: Diagnosis not present

## 2020-01-05 DIAGNOSIS — N1831 Chronic kidney disease, stage 3a: Secondary | ICD-10-CM

## 2020-01-05 DIAGNOSIS — I480 Paroxysmal atrial fibrillation: Secondary | ICD-10-CM | POA: Diagnosis not present

## 2020-01-05 DIAGNOSIS — I471 Supraventricular tachycardia: Secondary | ICD-10-CM

## 2020-01-05 DIAGNOSIS — C801 Malignant (primary) neoplasm, unspecified: Secondary | ICD-10-CM

## 2020-01-05 DIAGNOSIS — E785 Hyperlipidemia, unspecified: Secondary | ICD-10-CM | POA: Diagnosis not present

## 2020-01-05 DIAGNOSIS — R7989 Other specified abnormal findings of blood chemistry: Secondary | ICD-10-CM

## 2020-01-05 LAB — CBC
Hematocrit: 24.1 % — ABNORMAL LOW (ref 34.0–46.6)
Hemoglobin: 7.9 g/dL — ABNORMAL LOW (ref 11.1–15.9)
MCH: 29.6 pg (ref 26.6–33.0)
MCHC: 32.8 g/dL (ref 31.5–35.7)
MCV: 90 fL (ref 79–97)
Platelets: 165 10*3/uL (ref 150–450)
RBC: 2.67 x10E6/uL — CL (ref 3.77–5.28)
RDW: 13.4 % (ref 11.7–15.4)
WBC: 3.8 10*3/uL (ref 3.4–10.8)

## 2020-01-05 LAB — BASIC METABOLIC PANEL
BUN/Creatinine Ratio: 33 — ABNORMAL HIGH (ref 12–28)
BUN: 73 mg/dL — ABNORMAL HIGH (ref 8–27)
CO2: 29 mmol/L (ref 20–29)
Calcium: 7.9 mg/dL — ABNORMAL LOW (ref 8.7–10.3)
Chloride: 94 mmol/L — ABNORMAL LOW (ref 96–106)
Creatinine, Ser: 2.18 mg/dL — ABNORMAL HIGH (ref 0.57–1.00)
GFR calc Af Amer: 25 mL/min/{1.73_m2} — ABNORMAL LOW (ref >59)
GFR calc non Af Amer: 22 mL/min/{1.73_m2} — ABNORMAL LOW (ref >59)
Glucose: 87 mg/dL (ref 65–99)
Potassium: 3.9 mmol/L (ref 3.5–5.2)
Sodium: 140 mmol/L (ref 134–144)

## 2020-01-05 NOTE — Telephone Encounter (Signed)
Informed the pt that per Dr. Meda Coffee, based on her lab results, she should HOLD her metolazone and come in for repeat BMET in 2 weeks.  Scheduled the pt to come in for labs in 2 weeks on 01/19/20. Pt verbalized understanding and agrees with this plan.

## 2020-01-05 NOTE — Patient Instructions (Addendum)
After Visit Summary:  We will be checking the following labs today - BMET & CBC   Medication Instructions:    Continue with your current medicines.    If you need a refill on your cardiac medications before your next appointment, please call your pharmacy.     Testing/Procedures To Be Arranged:  N/A  Follow-Up:   See [place] back in about 3 months  I am sending a note to Dr. Drema Dallas down at [facility] about our visit today.     At [facility], you and your health needs are our priority.  As part of our continuing mission to provide you with exceptional heart care, we have created designated Provider Care Teams.  These Care Teams include your primary Cardiologist (physician) and Advanced Practice Providers (APPs -  Physician Assistants and Nurse Practitioners) who all work together to provide you with the care you need, when you need it.  Special Instructions:  . Stay safe, wash your hands for at least 20 seconds and wear a mask when needed.  . It was good to talk with you today.    Call the [place] office at [phone] if you have any questions, problems or concerns.

## 2020-01-05 NOTE — Telephone Encounter (Signed)
-----   Message from Dorothy Spark, MD sent at 01/05/2020  5:27 PM EDT ----- Please advise to hold metolazone and repeat BMP in 2 weeks

## 2020-01-06 DIAGNOSIS — K5190 Ulcerative colitis, unspecified, without complications: Secondary | ICD-10-CM | POA: Diagnosis not present

## 2020-01-06 DIAGNOSIS — N1831 Chronic kidney disease, stage 3a: Secondary | ICD-10-CM | POA: Diagnosis not present

## 2020-01-06 DIAGNOSIS — I130 Hypertensive heart and chronic kidney disease with heart failure and stage 1 through stage 4 chronic kidney disease, or unspecified chronic kidney disease: Secondary | ICD-10-CM | POA: Diagnosis not present

## 2020-01-06 DIAGNOSIS — I5033 Acute on chronic diastolic (congestive) heart failure: Secondary | ICD-10-CM | POA: Diagnosis not present

## 2020-01-06 DIAGNOSIS — I739 Peripheral vascular disease, unspecified: Secondary | ICD-10-CM | POA: Diagnosis not present

## 2020-01-06 DIAGNOSIS — Z993 Dependence on wheelchair: Secondary | ICD-10-CM | POA: Diagnosis not present

## 2020-01-06 DIAGNOSIS — E039 Hypothyroidism, unspecified: Secondary | ICD-10-CM | POA: Diagnosis not present

## 2020-01-06 DIAGNOSIS — I480 Paroxysmal atrial fibrillation: Secondary | ICD-10-CM | POA: Diagnosis not present

## 2020-01-06 DIAGNOSIS — E785 Hyperlipidemia, unspecified: Secondary | ICD-10-CM | POA: Diagnosis not present

## 2020-01-06 DIAGNOSIS — D509 Iron deficiency anemia, unspecified: Secondary | ICD-10-CM | POA: Diagnosis not present

## 2020-01-06 DIAGNOSIS — J449 Chronic obstructive pulmonary disease, unspecified: Secondary | ICD-10-CM | POA: Diagnosis not present

## 2020-01-06 DIAGNOSIS — J9621 Acute and chronic respiratory failure with hypoxia: Secondary | ICD-10-CM | POA: Diagnosis not present

## 2020-01-06 DIAGNOSIS — D696 Thrombocytopenia, unspecified: Secondary | ICD-10-CM | POA: Diagnosis not present

## 2020-01-06 DIAGNOSIS — Z9981 Dependence on supplemental oxygen: Secondary | ICD-10-CM | POA: Diagnosis not present

## 2020-01-06 DIAGNOSIS — I872 Venous insufficiency (chronic) (peripheral): Secondary | ICD-10-CM | POA: Diagnosis not present

## 2020-01-06 DIAGNOSIS — A0472 Enterocolitis due to Clostridium difficile, not specified as recurrent: Secondary | ICD-10-CM | POA: Diagnosis not present

## 2020-01-06 DIAGNOSIS — K222 Esophageal obstruction: Secondary | ICD-10-CM | POA: Diagnosis not present

## 2020-01-07 DIAGNOSIS — J449 Chronic obstructive pulmonary disease, unspecified: Secondary | ICD-10-CM | POA: Diagnosis not present

## 2020-01-07 DIAGNOSIS — I5020 Unspecified systolic (congestive) heart failure: Secondary | ICD-10-CM | POA: Diagnosis not present

## 2020-01-07 DIAGNOSIS — R52 Pain, unspecified: Secondary | ICD-10-CM | POA: Diagnosis not present

## 2020-01-07 DIAGNOSIS — M6281 Muscle weakness (generalized): Secondary | ICD-10-CM | POA: Diagnosis not present

## 2020-01-12 DIAGNOSIS — D696 Thrombocytopenia, unspecified: Secondary | ICD-10-CM | POA: Diagnosis not present

## 2020-01-12 DIAGNOSIS — K222 Esophageal obstruction: Secondary | ICD-10-CM | POA: Diagnosis not present

## 2020-01-12 DIAGNOSIS — J9621 Acute and chronic respiratory failure with hypoxia: Secondary | ICD-10-CM | POA: Diagnosis not present

## 2020-01-12 DIAGNOSIS — K5190 Ulcerative colitis, unspecified, without complications: Secondary | ICD-10-CM | POA: Diagnosis not present

## 2020-01-12 DIAGNOSIS — I130 Hypertensive heart and chronic kidney disease with heart failure and stage 1 through stage 4 chronic kidney disease, or unspecified chronic kidney disease: Secondary | ICD-10-CM | POA: Diagnosis not present

## 2020-01-12 DIAGNOSIS — Z993 Dependence on wheelchair: Secondary | ICD-10-CM | POA: Diagnosis not present

## 2020-01-12 DIAGNOSIS — I739 Peripheral vascular disease, unspecified: Secondary | ICD-10-CM | POA: Diagnosis not present

## 2020-01-12 DIAGNOSIS — I872 Venous insufficiency (chronic) (peripheral): Secondary | ICD-10-CM | POA: Diagnosis not present

## 2020-01-12 DIAGNOSIS — I5033 Acute on chronic diastolic (congestive) heart failure: Secondary | ICD-10-CM | POA: Diagnosis not present

## 2020-01-12 DIAGNOSIS — A0472 Enterocolitis due to Clostridium difficile, not specified as recurrent: Secondary | ICD-10-CM | POA: Diagnosis not present

## 2020-01-12 DIAGNOSIS — I480 Paroxysmal atrial fibrillation: Secondary | ICD-10-CM | POA: Diagnosis not present

## 2020-01-12 DIAGNOSIS — N1831 Chronic kidney disease, stage 3a: Secondary | ICD-10-CM | POA: Diagnosis not present

## 2020-01-12 DIAGNOSIS — J449 Chronic obstructive pulmonary disease, unspecified: Secondary | ICD-10-CM | POA: Diagnosis not present

## 2020-01-12 DIAGNOSIS — D509 Iron deficiency anemia, unspecified: Secondary | ICD-10-CM | POA: Diagnosis not present

## 2020-01-12 DIAGNOSIS — E785 Hyperlipidemia, unspecified: Secondary | ICD-10-CM | POA: Diagnosis not present

## 2020-01-12 DIAGNOSIS — E039 Hypothyroidism, unspecified: Secondary | ICD-10-CM | POA: Diagnosis not present

## 2020-01-12 DIAGNOSIS — Z9981 Dependence on supplemental oxygen: Secondary | ICD-10-CM | POA: Diagnosis not present

## 2020-01-13 DIAGNOSIS — E785 Hyperlipidemia, unspecified: Secondary | ICD-10-CM | POA: Diagnosis not present

## 2020-01-13 DIAGNOSIS — Z602 Problems related to living alone: Secondary | ICD-10-CM | POA: Diagnosis not present

## 2020-01-13 DIAGNOSIS — E039 Hypothyroidism, unspecified: Secondary | ICD-10-CM | POA: Diagnosis not present

## 2020-01-13 DIAGNOSIS — N189 Chronic kidney disease, unspecified: Secondary | ICD-10-CM | POA: Diagnosis not present

## 2020-01-13 DIAGNOSIS — Z87891 Personal history of nicotine dependence: Secondary | ICD-10-CM | POA: Diagnosis not present

## 2020-01-13 DIAGNOSIS — D61818 Other pancytopenia: Secondary | ICD-10-CM | POA: Diagnosis not present

## 2020-01-13 DIAGNOSIS — J449 Chronic obstructive pulmonary disease, unspecified: Secondary | ICD-10-CM | POA: Diagnosis not present

## 2020-01-13 DIAGNOSIS — D631 Anemia in chronic kidney disease: Secondary | ICD-10-CM | POA: Diagnosis not present

## 2020-01-13 DIAGNOSIS — Z515 Encounter for palliative care: Secondary | ICD-10-CM | POA: Diagnosis not present

## 2020-01-13 DIAGNOSIS — I509 Heart failure, unspecified: Secondary | ICD-10-CM | POA: Diagnosis not present

## 2020-01-13 DIAGNOSIS — N183 Chronic kidney disease, stage 3 unspecified: Secondary | ICD-10-CM | POA: Diagnosis not present

## 2020-01-14 DIAGNOSIS — I82499 Acute embolism and thrombosis of other specified deep vein of unspecified lower extremity: Secondary | ICD-10-CM | POA: Diagnosis not present

## 2020-01-14 DIAGNOSIS — G4733 Obstructive sleep apnea (adult) (pediatric): Secondary | ICD-10-CM | POA: Diagnosis not present

## 2020-01-14 DIAGNOSIS — I5030 Unspecified diastolic (congestive) heart failure: Secondary | ICD-10-CM | POA: Diagnosis not present

## 2020-01-14 DIAGNOSIS — R269 Unspecified abnormalities of gait and mobility: Secondary | ICD-10-CM | POA: Diagnosis not present

## 2020-01-14 DIAGNOSIS — J449 Chronic obstructive pulmonary disease, unspecified: Secondary | ICD-10-CM | POA: Diagnosis not present

## 2020-01-18 DIAGNOSIS — E785 Hyperlipidemia, unspecified: Secondary | ICD-10-CM | POA: Diagnosis not present

## 2020-01-18 DIAGNOSIS — K222 Esophageal obstruction: Secondary | ICD-10-CM | POA: Diagnosis not present

## 2020-01-18 DIAGNOSIS — I872 Venous insufficiency (chronic) (peripheral): Secondary | ICD-10-CM | POA: Diagnosis not present

## 2020-01-18 DIAGNOSIS — N1831 Chronic kidney disease, stage 3a: Secondary | ICD-10-CM | POA: Diagnosis not present

## 2020-01-18 DIAGNOSIS — A0472 Enterocolitis due to Clostridium difficile, not specified as recurrent: Secondary | ICD-10-CM | POA: Diagnosis not present

## 2020-01-18 DIAGNOSIS — I480 Paroxysmal atrial fibrillation: Secondary | ICD-10-CM | POA: Diagnosis not present

## 2020-01-18 DIAGNOSIS — I130 Hypertensive heart and chronic kidney disease with heart failure and stage 1 through stage 4 chronic kidney disease, or unspecified chronic kidney disease: Secondary | ICD-10-CM | POA: Diagnosis not present

## 2020-01-18 DIAGNOSIS — Z9981 Dependence on supplemental oxygen: Secondary | ICD-10-CM | POA: Diagnosis not present

## 2020-01-18 DIAGNOSIS — D509 Iron deficiency anemia, unspecified: Secondary | ICD-10-CM | POA: Diagnosis not present

## 2020-01-18 DIAGNOSIS — J9621 Acute and chronic respiratory failure with hypoxia: Secondary | ICD-10-CM | POA: Diagnosis not present

## 2020-01-18 DIAGNOSIS — D696 Thrombocytopenia, unspecified: Secondary | ICD-10-CM | POA: Diagnosis not present

## 2020-01-18 DIAGNOSIS — Z993 Dependence on wheelchair: Secondary | ICD-10-CM | POA: Diagnosis not present

## 2020-01-18 DIAGNOSIS — I739 Peripheral vascular disease, unspecified: Secondary | ICD-10-CM | POA: Diagnosis not present

## 2020-01-18 DIAGNOSIS — J449 Chronic obstructive pulmonary disease, unspecified: Secondary | ICD-10-CM | POA: Diagnosis not present

## 2020-01-18 DIAGNOSIS — I5033 Acute on chronic diastolic (congestive) heart failure: Secondary | ICD-10-CM | POA: Diagnosis not present

## 2020-01-18 DIAGNOSIS — K5190 Ulcerative colitis, unspecified, without complications: Secondary | ICD-10-CM | POA: Diagnosis not present

## 2020-01-18 DIAGNOSIS — E039 Hypothyroidism, unspecified: Secondary | ICD-10-CM | POA: Diagnosis not present

## 2020-01-19 ENCOUNTER — Other Ambulatory Visit: Payer: Medicare Other

## 2020-01-19 DIAGNOSIS — Z87891 Personal history of nicotine dependence: Secondary | ICD-10-CM | POA: Diagnosis not present

## 2020-01-19 DIAGNOSIS — N189 Chronic kidney disease, unspecified: Secondary | ICD-10-CM | POA: Diagnosis not present

## 2020-01-19 DIAGNOSIS — K644 Residual hemorrhoidal skin tags: Secondary | ICD-10-CM | POA: Diagnosis not present

## 2020-01-19 DIAGNOSIS — I5032 Chronic diastolic (congestive) heart failure: Secondary | ICD-10-CM | POA: Diagnosis not present

## 2020-01-19 DIAGNOSIS — E039 Hypothyroidism, unspecified: Secondary | ICD-10-CM | POA: Diagnosis not present

## 2020-01-19 DIAGNOSIS — R933 Abnormal findings on diagnostic imaging of other parts of digestive tract: Secondary | ICD-10-CM | POA: Diagnosis not present

## 2020-01-19 DIAGNOSIS — D649 Anemia, unspecified: Secondary | ICD-10-CM | POA: Diagnosis not present

## 2020-01-19 DIAGNOSIS — Z79899 Other long term (current) drug therapy: Secondary | ICD-10-CM | POA: Diagnosis not present

## 2020-01-19 DIAGNOSIS — E785 Hyperlipidemia, unspecified: Secondary | ICD-10-CM | POA: Diagnosis not present

## 2020-01-19 DIAGNOSIS — K635 Polyp of colon: Secondary | ICD-10-CM | POA: Diagnosis not present

## 2020-01-19 DIAGNOSIS — K5190 Ulcerative colitis, unspecified, without complications: Secondary | ICD-10-CM | POA: Diagnosis not present

## 2020-01-19 DIAGNOSIS — J449 Chronic obstructive pulmonary disease, unspecified: Secondary | ICD-10-CM | POA: Diagnosis not present

## 2020-01-19 DIAGNOSIS — K621 Rectal polyp: Secondary | ICD-10-CM | POA: Diagnosis not present

## 2020-01-19 DIAGNOSIS — K219 Gastro-esophageal reflux disease without esophagitis: Secondary | ICD-10-CM | POA: Diagnosis not present

## 2020-01-19 DIAGNOSIS — D122 Benign neoplasm of ascending colon: Secondary | ICD-10-CM | POA: Diagnosis not present

## 2020-01-31 DIAGNOSIS — I82499 Acute embolism and thrombosis of other specified deep vein of unspecified lower extremity: Secondary | ICD-10-CM | POA: Diagnosis not present

## 2020-01-31 DIAGNOSIS — J449 Chronic obstructive pulmonary disease, unspecified: Secondary | ICD-10-CM | POA: Diagnosis not present

## 2020-01-31 DIAGNOSIS — G4733 Obstructive sleep apnea (adult) (pediatric): Secondary | ICD-10-CM | POA: Diagnosis not present

## 2020-01-31 DIAGNOSIS — R269 Unspecified abnormalities of gait and mobility: Secondary | ICD-10-CM | POA: Diagnosis not present

## 2020-01-31 DIAGNOSIS — I5030 Unspecified diastolic (congestive) heart failure: Secondary | ICD-10-CM | POA: Diagnosis not present

## 2020-02-07 DIAGNOSIS — R52 Pain, unspecified: Secondary | ICD-10-CM | POA: Diagnosis not present

## 2020-02-07 DIAGNOSIS — M6281 Muscle weakness (generalized): Secondary | ICD-10-CM | POA: Diagnosis not present

## 2020-02-07 DIAGNOSIS — I5020 Unspecified systolic (congestive) heart failure: Secondary | ICD-10-CM | POA: Diagnosis not present

## 2020-02-07 DIAGNOSIS — J449 Chronic obstructive pulmonary disease, unspecified: Secondary | ICD-10-CM | POA: Diagnosis not present

## 2020-02-14 DIAGNOSIS — I82499 Acute embolism and thrombosis of other specified deep vein of unspecified lower extremity: Secondary | ICD-10-CM | POA: Diagnosis not present

## 2020-02-14 DIAGNOSIS — I5030 Unspecified diastolic (congestive) heart failure: Secondary | ICD-10-CM | POA: Diagnosis not present

## 2020-02-14 DIAGNOSIS — R269 Unspecified abnormalities of gait and mobility: Secondary | ICD-10-CM | POA: Diagnosis not present

## 2020-02-14 DIAGNOSIS — G4733 Obstructive sleep apnea (adult) (pediatric): Secondary | ICD-10-CM | POA: Diagnosis not present

## 2020-02-14 DIAGNOSIS — J449 Chronic obstructive pulmonary disease, unspecified: Secondary | ICD-10-CM | POA: Diagnosis not present

## 2020-02-16 DIAGNOSIS — Z1159 Encounter for screening for other viral diseases: Secondary | ICD-10-CM | POA: Diagnosis not present

## 2020-02-16 DIAGNOSIS — K51011 Ulcerative (chronic) pancolitis with rectal bleeding: Secondary | ICD-10-CM | POA: Diagnosis not present

## 2020-02-25 DIAGNOSIS — I517 Cardiomegaly: Secondary | ICD-10-CM | POA: Diagnosis not present

## 2020-02-29 DIAGNOSIS — R970 Elevated carcinoembryonic antigen [CEA]: Secondary | ICD-10-CM | POA: Diagnosis not present

## 2020-02-29 DIAGNOSIS — E079 Disorder of thyroid, unspecified: Secondary | ICD-10-CM | POA: Diagnosis not present

## 2020-02-29 DIAGNOSIS — K51011 Ulcerative (chronic) pancolitis with rectal bleeding: Secondary | ICD-10-CM | POA: Diagnosis not present

## 2020-02-29 DIAGNOSIS — R971 Elevated cancer antigen 125 [CA 125]: Secondary | ICD-10-CM | POA: Diagnosis not present

## 2020-03-01 DIAGNOSIS — K5190 Ulcerative colitis, unspecified, without complications: Secondary | ICD-10-CM | POA: Diagnosis not present

## 2020-03-01 DIAGNOSIS — R971 Elevated cancer antigen 125 [CA 125]: Secondary | ICD-10-CM | POA: Diagnosis not present

## 2020-03-01 DIAGNOSIS — R970 Elevated carcinoembryonic antigen [CEA]: Secondary | ICD-10-CM | POA: Diagnosis not present

## 2020-03-01 DIAGNOSIS — J449 Chronic obstructive pulmonary disease, unspecified: Secondary | ICD-10-CM | POA: Diagnosis not present

## 2020-03-01 DIAGNOSIS — E079 Disorder of thyroid, unspecified: Secondary | ICD-10-CM | POA: Diagnosis not present

## 2020-03-01 DIAGNOSIS — K51011 Ulcerative (chronic) pancolitis with rectal bleeding: Secondary | ICD-10-CM | POA: Diagnosis not present

## 2020-03-02 DIAGNOSIS — J449 Chronic obstructive pulmonary disease, unspecified: Secondary | ICD-10-CM | POA: Diagnosis not present

## 2020-03-02 DIAGNOSIS — I5030 Unspecified diastolic (congestive) heart failure: Secondary | ICD-10-CM | POA: Diagnosis not present

## 2020-03-02 DIAGNOSIS — G4733 Obstructive sleep apnea (adult) (pediatric): Secondary | ICD-10-CM | POA: Diagnosis not present

## 2020-03-02 DIAGNOSIS — R269 Unspecified abnormalities of gait and mobility: Secondary | ICD-10-CM | POA: Diagnosis not present

## 2020-03-02 DIAGNOSIS — I82499 Acute embolism and thrombosis of other specified deep vein of unspecified lower extremity: Secondary | ICD-10-CM | POA: Diagnosis not present

## 2020-03-08 DIAGNOSIS — H43811 Vitreous degeneration, right eye: Secondary | ICD-10-CM | POA: Diagnosis not present

## 2020-03-08 DIAGNOSIS — H2512 Age-related nuclear cataract, left eye: Secondary | ICD-10-CM | POA: Diagnosis not present

## 2020-03-08 DIAGNOSIS — Z961 Presence of intraocular lens: Secondary | ICD-10-CM | POA: Diagnosis not present

## 2020-03-09 DIAGNOSIS — I5020 Unspecified systolic (congestive) heart failure: Secondary | ICD-10-CM | POA: Diagnosis not present

## 2020-03-09 DIAGNOSIS — M6281 Muscle weakness (generalized): Secondary | ICD-10-CM | POA: Diagnosis not present

## 2020-03-09 DIAGNOSIS — R52 Pain, unspecified: Secondary | ICD-10-CM | POA: Diagnosis not present

## 2020-03-09 DIAGNOSIS — J449 Chronic obstructive pulmonary disease, unspecified: Secondary | ICD-10-CM | POA: Diagnosis not present

## 2020-03-12 DIAGNOSIS — H2512 Age-related nuclear cataract, left eye: Secondary | ICD-10-CM | POA: Diagnosis not present

## 2020-03-14 DIAGNOSIS — K51011 Ulcerative (chronic) pancolitis with rectal bleeding: Secondary | ICD-10-CM | POA: Diagnosis not present

## 2020-03-16 DIAGNOSIS — G4733 Obstructive sleep apnea (adult) (pediatric): Secondary | ICD-10-CM | POA: Diagnosis not present

## 2020-03-16 DIAGNOSIS — R269 Unspecified abnormalities of gait and mobility: Secondary | ICD-10-CM | POA: Diagnosis not present

## 2020-03-16 DIAGNOSIS — H2512 Age-related nuclear cataract, left eye: Secondary | ICD-10-CM | POA: Diagnosis not present

## 2020-03-16 DIAGNOSIS — J449 Chronic obstructive pulmonary disease, unspecified: Secondary | ICD-10-CM | POA: Diagnosis not present

## 2020-03-16 DIAGNOSIS — I82499 Acute embolism and thrombosis of other specified deep vein of unspecified lower extremity: Secondary | ICD-10-CM | POA: Diagnosis not present

## 2020-03-16 DIAGNOSIS — I5030 Unspecified diastolic (congestive) heart failure: Secondary | ICD-10-CM | POA: Diagnosis not present

## 2020-03-20 DIAGNOSIS — R971 Elevated cancer antigen 125 [CA 125]: Secondary | ICD-10-CM | POA: Diagnosis not present

## 2020-03-28 DIAGNOSIS — R971 Elevated cancer antigen 125 [CA 125]: Secondary | ICD-10-CM | POA: Diagnosis not present

## 2020-03-30 DIAGNOSIS — N1832 Chronic kidney disease, stage 3b: Secondary | ICD-10-CM | POA: Diagnosis not present

## 2020-03-30 DIAGNOSIS — Z9049 Acquired absence of other specified parts of digestive tract: Secondary | ICD-10-CM | POA: Diagnosis not present

## 2020-03-30 DIAGNOSIS — R269 Unspecified abnormalities of gait and mobility: Secondary | ICD-10-CM | POA: Diagnosis not present

## 2020-03-30 DIAGNOSIS — I5030 Unspecified diastolic (congestive) heart failure: Secondary | ICD-10-CM | POA: Diagnosis not present

## 2020-03-30 DIAGNOSIS — J449 Chronic obstructive pulmonary disease, unspecified: Secondary | ICD-10-CM | POA: Diagnosis not present

## 2020-03-30 DIAGNOSIS — Z993 Dependence on wheelchair: Secondary | ICD-10-CM | POA: Diagnosis not present

## 2020-03-30 DIAGNOSIS — Z79899 Other long term (current) drug therapy: Secondary | ICD-10-CM | POA: Diagnosis not present

## 2020-03-30 DIAGNOSIS — R1909 Other intra-abdominal and pelvic swelling, mass and lump: Secondary | ICD-10-CM | POA: Diagnosis not present

## 2020-03-30 DIAGNOSIS — E039 Hypothyroidism, unspecified: Secondary | ICD-10-CM | POA: Diagnosis not present

## 2020-03-30 DIAGNOSIS — C7963 Secondary malignant neoplasm of bilateral ovaries: Secondary | ICD-10-CM | POA: Diagnosis not present

## 2020-03-30 DIAGNOSIS — E785 Hyperlipidemia, unspecified: Secondary | ICD-10-CM | POA: Diagnosis not present

## 2020-03-30 DIAGNOSIS — Z87891 Personal history of nicotine dependence: Secondary | ICD-10-CM | POA: Diagnosis not present

## 2020-03-30 DIAGNOSIS — G4733 Obstructive sleep apnea (adult) (pediatric): Secondary | ICD-10-CM | POA: Diagnosis not present

## 2020-03-30 DIAGNOSIS — R1900 Intra-abdominal and pelvic swelling, mass and lump, unspecified site: Secondary | ICD-10-CM | POA: Diagnosis not present

## 2020-03-30 DIAGNOSIS — I82499 Acute embolism and thrombosis of other specified deep vein of unspecified lower extremity: Secondary | ICD-10-CM | POA: Diagnosis not present

## 2020-03-30 DIAGNOSIS — I5032 Chronic diastolic (congestive) heart failure: Secondary | ICD-10-CM | POA: Diagnosis not present

## 2020-03-30 DIAGNOSIS — R531 Weakness: Secondary | ICD-10-CM | POA: Diagnosis not present

## 2020-04-01 DIAGNOSIS — J449 Chronic obstructive pulmonary disease, unspecified: Secondary | ICD-10-CM | POA: Diagnosis not present

## 2020-04-01 DIAGNOSIS — G4733 Obstructive sleep apnea (adult) (pediatric): Secondary | ICD-10-CM | POA: Diagnosis not present

## 2020-04-01 DIAGNOSIS — I82499 Acute embolism and thrombosis of other specified deep vein of unspecified lower extremity: Secondary | ICD-10-CM | POA: Diagnosis not present

## 2020-04-01 DIAGNOSIS — R269 Unspecified abnormalities of gait and mobility: Secondary | ICD-10-CM | POA: Diagnosis not present

## 2020-04-01 DIAGNOSIS — I5030 Unspecified diastolic (congestive) heart failure: Secondary | ICD-10-CM | POA: Diagnosis not present

## 2020-04-03 DIAGNOSIS — I509 Heart failure, unspecified: Secondary | ICD-10-CM | POA: Diagnosis not present

## 2020-04-03 DIAGNOSIS — Z7901 Long term (current) use of anticoagulants: Secondary | ICD-10-CM | POA: Diagnosis not present

## 2020-04-03 DIAGNOSIS — Z9981 Dependence on supplemental oxygen: Secondary | ICD-10-CM | POA: Diagnosis not present

## 2020-04-03 DIAGNOSIS — E039 Hypothyroidism, unspecified: Secondary | ICD-10-CM | POA: Diagnosis not present

## 2020-04-03 DIAGNOSIS — J449 Chronic obstructive pulmonary disease, unspecified: Secondary | ICD-10-CM | POA: Diagnosis not present

## 2020-04-03 DIAGNOSIS — N183 Chronic kidney disease, stage 3 unspecified: Secondary | ICD-10-CM | POA: Diagnosis not present

## 2020-04-05 DIAGNOSIS — I509 Heart failure, unspecified: Secondary | ICD-10-CM | POA: Diagnosis not present

## 2020-04-05 DIAGNOSIS — J449 Chronic obstructive pulmonary disease, unspecified: Secondary | ICD-10-CM | POA: Diagnosis not present

## 2020-04-05 DIAGNOSIS — Z7901 Long term (current) use of anticoagulants: Secondary | ICD-10-CM | POA: Diagnosis not present

## 2020-04-05 DIAGNOSIS — Z9981 Dependence on supplemental oxygen: Secondary | ICD-10-CM | POA: Diagnosis not present

## 2020-04-05 DIAGNOSIS — N183 Chronic kidney disease, stage 3 unspecified: Secondary | ICD-10-CM | POA: Diagnosis not present

## 2020-04-05 DIAGNOSIS — E039 Hypothyroidism, unspecified: Secondary | ICD-10-CM | POA: Diagnosis not present

## 2020-04-07 DIAGNOSIS — N183 Chronic kidney disease, stage 3 unspecified: Secondary | ICD-10-CM | POA: Diagnosis not present

## 2020-04-07 DIAGNOSIS — I509 Heart failure, unspecified: Secondary | ICD-10-CM | POA: Diagnosis not present

## 2020-04-07 DIAGNOSIS — J449 Chronic obstructive pulmonary disease, unspecified: Secondary | ICD-10-CM | POA: Diagnosis not present

## 2020-04-07 DIAGNOSIS — E039 Hypothyroidism, unspecified: Secondary | ICD-10-CM | POA: Diagnosis not present

## 2020-04-07 DIAGNOSIS — Z9981 Dependence on supplemental oxygen: Secondary | ICD-10-CM | POA: Diagnosis not present

## 2020-04-07 DIAGNOSIS — Z7901 Long term (current) use of anticoagulants: Secondary | ICD-10-CM | POA: Diagnosis not present

## 2020-04-08 DIAGNOSIS — J449 Chronic obstructive pulmonary disease, unspecified: Secondary | ICD-10-CM | POA: Diagnosis not present

## 2020-04-08 DIAGNOSIS — I5020 Unspecified systolic (congestive) heart failure: Secondary | ICD-10-CM | POA: Diagnosis not present

## 2020-04-08 DIAGNOSIS — R52 Pain, unspecified: Secondary | ICD-10-CM | POA: Diagnosis not present

## 2020-04-08 DIAGNOSIS — M6281 Muscle weakness (generalized): Secondary | ICD-10-CM | POA: Diagnosis not present

## 2020-04-10 ENCOUNTER — Telehealth: Payer: Self-pay | Admitting: Cardiology

## 2020-04-10 MED ORDER — METOPROLOL TARTRATE 25 MG PO TABS: 12.5000 mg | ORAL_TABLET | Freq: Two times a day (BID) | ORAL | 1 refills | Status: DC

## 2020-04-10 NOTE — Telephone Encounter (Signed)
Pt is calling in asking for her metoprolol tartrate 12.5 mg po bid to now be sent to her mail order pharmacy [facility], for she last had them filled at her local pharmacy, and [facility] keeps sending her the incorrect dose.  Informed the pt that I will send in the lopressor 12.5 mg po bid to [facility] now, and she should take the incorrect doses of this med that they originally sent to her, to a local pharmacy, to have them safely dispose of the meds.  Pt verbalized understanding and agrees with this plan. Pt was more than gracious for all the assistance provided.

## 2020-04-10 NOTE — Telephone Encounter (Signed)
Pt c/o medication issue:  1. Name of Medication: metoprolol tartrate (LOPRESSOR) 25 MG tablet [[id number]]    2. How are you currently taking this medication (dosage and times per day)?      Sig: Take 0.5 tablets (12.5 mg total) by mouth 2 (two) times daily     3. Are you having a reaction (difficulty breathing--STAT)? No   4. What is your medication issue? Pt stated that she rec'd 50MG tabs from her mail order pharmacy.  She is confused  on how she is suppose to be taking this med?  She would like a call back just to clarify.    Best number  [phone]

## 2020-04-11 DIAGNOSIS — K51011 Ulcerative (chronic) pancolitis with rectal bleeding: Secondary | ICD-10-CM | POA: Diagnosis not present

## 2020-04-14 DIAGNOSIS — I509 Heart failure, unspecified: Secondary | ICD-10-CM | POA: Diagnosis not present

## 2020-04-14 DIAGNOSIS — J449 Chronic obstructive pulmonary disease, unspecified: Secondary | ICD-10-CM | POA: Diagnosis not present

## 2020-04-14 DIAGNOSIS — Z7901 Long term (current) use of anticoagulants: Secondary | ICD-10-CM | POA: Diagnosis not present

## 2020-04-14 DIAGNOSIS — E039 Hypothyroidism, unspecified: Secondary | ICD-10-CM | POA: Diagnosis not present

## 2020-04-14 DIAGNOSIS — Z9981 Dependence on supplemental oxygen: Secondary | ICD-10-CM | POA: Diagnosis not present

## 2020-04-14 DIAGNOSIS — N183 Chronic kidney disease, stage 3 unspecified: Secondary | ICD-10-CM | POA: Diagnosis not present

## 2020-04-15 DIAGNOSIS — I5030 Unspecified diastolic (congestive) heart failure: Secondary | ICD-10-CM | POA: Diagnosis not present

## 2020-04-15 DIAGNOSIS — R269 Unspecified abnormalities of gait and mobility: Secondary | ICD-10-CM | POA: Diagnosis not present

## 2020-04-15 DIAGNOSIS — I82499 Acute embolism and thrombosis of other specified deep vein of unspecified lower extremity: Secondary | ICD-10-CM | POA: Diagnosis not present

## 2020-04-15 DIAGNOSIS — J449 Chronic obstructive pulmonary disease, unspecified: Secondary | ICD-10-CM | POA: Diagnosis not present

## 2020-04-15 DIAGNOSIS — G4733 Obstructive sleep apnea (adult) (pediatric): Secondary | ICD-10-CM | POA: Diagnosis not present

## 2020-04-19 DIAGNOSIS — E039 Hypothyroidism, unspecified: Secondary | ICD-10-CM | POA: Diagnosis not present

## 2020-04-19 DIAGNOSIS — N183 Chronic kidney disease, stage 3 unspecified: Secondary | ICD-10-CM | POA: Diagnosis not present

## 2020-04-19 DIAGNOSIS — Z9981 Dependence on supplemental oxygen: Secondary | ICD-10-CM | POA: Diagnosis not present

## 2020-04-19 DIAGNOSIS — Z7901 Long term (current) use of anticoagulants: Secondary | ICD-10-CM | POA: Diagnosis not present

## 2020-04-19 DIAGNOSIS — J449 Chronic obstructive pulmonary disease, unspecified: Secondary | ICD-10-CM | POA: Diagnosis not present

## 2020-04-19 DIAGNOSIS — I509 Heart failure, unspecified: Secondary | ICD-10-CM | POA: Diagnosis not present

## 2020-04-21 ENCOUNTER — Ambulatory Visit: Payer: Medicare Other | Admitting: Cardiology

## 2020-04-21 DIAGNOSIS — N390 Urinary tract infection, site not specified: Secondary | ICD-10-CM | POA: Diagnosis not present

## 2020-04-21 DIAGNOSIS — I509 Heart failure, unspecified: Secondary | ICD-10-CM | POA: Diagnosis not present

## 2020-04-21 DIAGNOSIS — Z9981 Dependence on supplemental oxygen: Secondary | ICD-10-CM | POA: Diagnosis not present

## 2020-04-21 DIAGNOSIS — E039 Hypothyroidism, unspecified: Secondary | ICD-10-CM | POA: Diagnosis not present

## 2020-04-21 DIAGNOSIS — Z7901 Long term (current) use of anticoagulants: Secondary | ICD-10-CM | POA: Diagnosis not present

## 2020-04-21 DIAGNOSIS — J449 Chronic obstructive pulmonary disease, unspecified: Secondary | ICD-10-CM | POA: Diagnosis not present

## 2020-04-21 DIAGNOSIS — N183 Chronic kidney disease, stage 3 unspecified: Secondary | ICD-10-CM | POA: Diagnosis not present

## 2020-04-24 ENCOUNTER — Ambulatory Visit: Payer: Medicare Other | Admitting: Cardiology

## 2020-04-24 ENCOUNTER — Encounter: Payer: Self-pay | Admitting: Cardiology

## 2020-04-24 ENCOUNTER — Other Ambulatory Visit: Payer: Self-pay

## 2020-04-24 VITALS — BP 124/68 | HR 55 | Ht 64.0 in | Wt 256.0 lb

## 2020-04-24 DIAGNOSIS — I5033 Acute on chronic diastolic (congestive) heart failure: Secondary | ICD-10-CM | POA: Diagnosis not present

## 2020-04-24 DIAGNOSIS — N1831 Chronic kidney disease, stage 3a: Secondary | ICD-10-CM

## 2020-04-24 DIAGNOSIS — I5032 Chronic diastolic (congestive) heart failure: Secondary | ICD-10-CM | POA: Diagnosis not present

## 2020-04-24 DIAGNOSIS — E785 Hyperlipidemia, unspecified: Secondary | ICD-10-CM | POA: Diagnosis not present
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
## 2020-04-24 NOTE — Patient Instructions (Signed)
Medication Instructions:   Your physician recommends that you continue on your current medications as directed. Please refer to the Current Medication list given to you today.  *If you need a refill on your cardiac medications before your next appointment, please call your pharmacy*  Lab Work:  TODAY--CMET, CBC, TSH, AND PRO-BNP  If you have labs (blood work) drawn today and your tests are completely normal, you will receive your results only by: Marland Kitchen MyChart Message (if you have MyChart) OR . A paper copy in the mail If you have any lab test that is abnormal or we need to change your treatment, we will call you to review the results.   Follow-Up:  2 MONTHS IN THE OFFICE WITH Truitt Merle NP

## 2020-04-25 ENCOUNTER — Telehealth: Payer: Self-pay | Admitting: Oncology

## 2020-04-25 ENCOUNTER — Telehealth: Payer: Self-pay | Admitting: *Deleted

## 2020-04-25 ENCOUNTER — Other Ambulatory Visit: Payer: Self-pay | Admitting: Gynecologic Oncology

## 2020-04-25 DIAGNOSIS — J449 Chronic obstructive pulmonary disease, unspecified: Secondary | ICD-10-CM | POA: Diagnosis not present

## 2020-04-25 DIAGNOSIS — C5700 Malignant neoplasm of unspecified fallopian tube: Secondary | ICD-10-CM

## 2020-04-25 DIAGNOSIS — R5381 Other malaise: Secondary | ICD-10-CM | POA: Diagnosis not present

## 2020-04-25 DIAGNOSIS — I872 Venous insufficiency (chronic) (peripheral): Secondary | ICD-10-CM | POA: Diagnosis not present

## 2020-04-25 DIAGNOSIS — Z5111 Encounter for antineoplastic chemotherapy: Secondary | ICD-10-CM | POA: Diagnosis not present

## 2020-04-25 LAB — COMPREHENSIVE METABOLIC PANEL
ALT: 16 IU/L (ref 0–32)
AST: 15 IU/L (ref 0–40)
Albumin/Globulin Ratio: 1.4 (ref 1.2–2.2)
Albumin: 4.1 g/dL (ref 3.7–4.7)
Alkaline Phosphatase: 110 IU/L (ref 44–121)
BUN/Creatinine Ratio: 26 (ref 12–28)
BUN: 53 mg/dL — ABNORMAL HIGH (ref 8–27)
Bilirubin Total: 0.3 mg/dL (ref 0.0–1.2)
CO2: 28 mmol/L (ref 20–29)
Calcium: 8.9 mg/dL (ref 8.7–10.3)
Chloride: 102 mmol/L (ref 96–106)
Creatinine, Ser: 2.03 mg/dL — ABNORMAL HIGH (ref 0.57–1.00)
GFR calc Af Amer: 27 mL/min/{1.73_m2} — ABNORMAL LOW (ref >59)
GFR calc non Af Amer: 23 mL/min/{1.73_m2} — ABNORMAL LOW (ref >59)
Globulin, Total: 3 g/dL (ref 1.5–4.5)
Glucose: 86 mg/dL (ref 65–99)
Potassium: 4.5 mmol/L (ref 3.5–5.2)
Sodium: 143 mmol/L (ref 134–144)
Total Protein: 7.1 g/dL (ref 6.0–8.5)

## 2020-04-25 LAB — CBC
Hematocrit: 27.4 % — ABNORMAL LOW (ref 34.0–46.6)
Hemoglobin: 8.6 g/dL — ABNORMAL LOW (ref 11.1–15.9)
MCH: 29.9 pg (ref 26.6–33.0)
MCHC: 31.4 g/dL — ABNORMAL LOW (ref 31.5–35.7)
MCV: 95 fL (ref 79–97)
Platelets: 141 10*3/uL — ABNORMAL LOW (ref 150–450)
RBC: 2.88 x10E6/uL — ABNORMAL LOW (ref 3.77–5.28)
RDW: 13.9 % (ref 11.7–15.4)
WBC: 4 10*3/uL (ref 3.4–10.8)

## 2020-04-25 LAB — TSH: TSH: 2.06 u[IU]/mL (ref 0.450–4.500)

## 2020-04-25 LAB — PRO B NATRIURETIC PEPTIDE: NT-Pro BNP: 3124 pg/mL — ABNORMAL HIGH (ref 0–738)

## 2020-04-25 MED ORDER — TORSEMIDE 20 MG PO TABS
ORAL_TABLET | ORAL | 0 refills | Status: DC
Start: 2020-04-25 — End: 2020-12-22

## 2020-04-25 MED ORDER — METOLAZONE 2.5 MG PO TABS
2.5000 mg | ORAL_TABLET | ORAL | 0 refills | Status: DC
Start: 2020-04-26 — End: 2020-06-27

## 2020-04-25 NOTE — Telephone Encounter (Signed)
Spoke with the pt and caregiver about lab results and recommendations per Dr. Meda Coffee, for her to increase her torsemide to taking 40 mg po in the AM, then take 20 mg po in the PM. Pt education provided on what time she should be taking her torsemide.  Also informed both parties that she would like for her to start taking metolazone 2.5 mg po three times weekly, take on Tuesdays, Thursdays, and Saturdays.  Informed both parties that she should take her metolazone 30 mins prior to her first dose of torsemide.  Confirmed the pharmacy of choice with the pt.  Pt and caregiver verbalized understanding and agrees with this plan.

## 2020-04-25 NOTE — Telephone Encounter (Signed)
Left a message regarding appointments with Dr. Alvy Bimler and Dr. Berline Lopes. Requested a return call.

## 2020-04-25 NOTE — Telephone Encounter (Signed)
-----   Message from Dorothy Spark, MD sent at 04/25/2020 12:47 PM EDT ----- I would increase Torsemide from 20-20 to 40 mg po am, 20 mg po pm and add metolazone 2.5 mg on Tu, Th and Sa Thank you, KN

## 2020-04-25 NOTE — Telephone Encounter (Signed)
Drusilla Kanner and scheduled new patient appointments with Dr. Alvy Bimler on 05/04/20 with arrival at 12:30 followed by Patient Education and Dr. Berline Lopes on 05/08/20 at 11:15.  Also discussed genetic counseling and scheduled appointment on 05/15/20.  Discussed the [place] location and to bring family/friend with her to the appointments.  She verbalized understanding and agreement and did not have any questions.

## 2020-04-26 DIAGNOSIS — E039 Hypothyroidism, unspecified: Secondary | ICD-10-CM | POA: Diagnosis not present

## 2020-04-26 DIAGNOSIS — Z9981 Dependence on supplemental oxygen: Secondary | ICD-10-CM | POA: Diagnosis not present

## 2020-04-26 DIAGNOSIS — I509 Heart failure, unspecified: Secondary | ICD-10-CM | POA: Diagnosis not present

## 2020-04-26 DIAGNOSIS — N183 Chronic kidney disease, stage 3 unspecified: Secondary | ICD-10-CM | POA: Diagnosis not present

## 2020-04-26 DIAGNOSIS — Z7901 Long term (current) use of anticoagulants: Secondary | ICD-10-CM | POA: Diagnosis not present

## 2020-04-26 DIAGNOSIS — C763 Malignant neoplasm of pelvis: Secondary | ICD-10-CM | POA: Diagnosis not present

## 2020-04-26 DIAGNOSIS — J449 Chronic obstructive pulmonary disease, unspecified: Secondary | ICD-10-CM | POA: Diagnosis not present

## 2020-04-28 DIAGNOSIS — E039 Hypothyroidism, unspecified: Secondary | ICD-10-CM | POA: Diagnosis not present

## 2020-04-28 DIAGNOSIS — I509 Heart failure, unspecified: Secondary | ICD-10-CM | POA: Diagnosis not present

## 2020-04-28 DIAGNOSIS — N183 Chronic kidney disease, stage 3 unspecified: Secondary | ICD-10-CM | POA: Diagnosis not present

## 2020-04-28 DIAGNOSIS — J449 Chronic obstructive pulmonary disease, unspecified: Secondary | ICD-10-CM | POA: Diagnosis not present

## 2020-04-28 DIAGNOSIS — Z7901 Long term (current) use of anticoagulants: Secondary | ICD-10-CM | POA: Diagnosis not present

## 2020-04-28 DIAGNOSIS — Z9981 Dependence on supplemental oxygen: Secondary | ICD-10-CM | POA: Diagnosis not present

## 2020-05-01 DIAGNOSIS — Z7901 Long term (current) use of anticoagulants: Secondary | ICD-10-CM | POA: Diagnosis not present

## 2020-05-01 DIAGNOSIS — E039 Hypothyroidism, unspecified: Secondary | ICD-10-CM | POA: Diagnosis not present

## 2020-05-01 DIAGNOSIS — N183 Chronic kidney disease, stage 3 unspecified: Secondary | ICD-10-CM | POA: Diagnosis not present

## 2020-05-01 DIAGNOSIS — I509 Heart failure, unspecified: Secondary | ICD-10-CM | POA: Diagnosis not present

## 2020-05-01 DIAGNOSIS — Z9981 Dependence on supplemental oxygen: Secondary | ICD-10-CM | POA: Diagnosis not present

## 2020-05-01 DIAGNOSIS — J449 Chronic obstructive pulmonary disease, unspecified: Secondary | ICD-10-CM | POA: Diagnosis not present

## 2020-05-02 DIAGNOSIS — I5030 Unspecified diastolic (congestive) heart failure: Secondary | ICD-10-CM | POA: Diagnosis not present

## 2020-05-02 DIAGNOSIS — J449 Chronic obstructive pulmonary disease, unspecified: Secondary | ICD-10-CM | POA: Diagnosis not present

## 2020-05-02 DIAGNOSIS — Z9981 Dependence on supplemental oxygen: Secondary | ICD-10-CM | POA: Diagnosis not present

## 2020-05-02 DIAGNOSIS — G4733 Obstructive sleep apnea (adult) (pediatric): Secondary | ICD-10-CM | POA: Diagnosis not present

## 2020-05-02 DIAGNOSIS — Z7901 Long term (current) use of anticoagulants: Secondary | ICD-10-CM | POA: Diagnosis not present

## 2020-05-02 DIAGNOSIS — I82499 Acute embolism and thrombosis of other specified deep vein of unspecified lower extremity: Secondary | ICD-10-CM | POA: Diagnosis not present

## 2020-05-02 DIAGNOSIS — I509 Heart failure, unspecified: Secondary | ICD-10-CM | POA: Diagnosis not present

## 2020-05-02 DIAGNOSIS — R269 Unspecified abnormalities of gait and mobility: Secondary | ICD-10-CM | POA: Diagnosis not present

## 2020-05-02 DIAGNOSIS — E039 Hypothyroidism, unspecified: Secondary | ICD-10-CM | POA: Diagnosis not present

## 2020-05-02 DIAGNOSIS — N183 Chronic kidney disease, stage 3 unspecified: Secondary | ICD-10-CM | POA: Diagnosis not present

## 2020-05-03 DIAGNOSIS — J449 Chronic obstructive pulmonary disease, unspecified: Secondary | ICD-10-CM | POA: Diagnosis not present

## 2020-05-03 DIAGNOSIS — E039 Hypothyroidism, unspecified: Secondary | ICD-10-CM | POA: Diagnosis not present

## 2020-05-03 DIAGNOSIS — I509 Heart failure, unspecified: Secondary | ICD-10-CM | POA: Diagnosis not present

## 2020-05-03 DIAGNOSIS — Z9981 Dependence on supplemental oxygen: Secondary | ICD-10-CM | POA: Diagnosis not present

## 2020-05-03 DIAGNOSIS — Z7901 Long term (current) use of anticoagulants: Secondary | ICD-10-CM | POA: Diagnosis not present

## 2020-05-03 DIAGNOSIS — N183 Chronic kidney disease, stage 3 unspecified: Secondary | ICD-10-CM | POA: Diagnosis not present

## 2020-05-04 ENCOUNTER — Other Ambulatory Visit: Payer: Self-pay

## 2020-05-04 ENCOUNTER — Inpatient Hospital Stay: Payer: Medicare Other

## 2020-05-04 ENCOUNTER — Encounter: Payer: Self-pay | Admitting: Hematology and Oncology

## 2020-05-04 ENCOUNTER — Inpatient Hospital Stay: Payer: Medicare Other | Attending: Hematology and Oncology | Admitting: Hematology and Oncology

## 2020-05-04 DIAGNOSIS — Z79899 Other long term (current) drug therapy: Secondary | ICD-10-CM | POA: Diagnosis not present

## 2020-05-04 DIAGNOSIS — C561 Malignant neoplasm of right ovary: Secondary | ICD-10-CM

## 2020-05-04 DIAGNOSIS — I5032 Chronic diastolic (congestive) heart failure: Secondary | ICD-10-CM

## 2020-05-04 DIAGNOSIS — D638 Anemia in other chronic diseases classified elsewhere: Secondary | ICD-10-CM | POA: Diagnosis not present

## 2020-05-04 DIAGNOSIS — Z87891 Personal history of nicotine dependence: Secondary | ICD-10-CM | POA: Diagnosis not present

## 2020-05-04 DIAGNOSIS — Z9981 Dependence on supplemental oxygen: Secondary | ICD-10-CM | POA: Diagnosis not present

## 2020-05-04 DIAGNOSIS — N184 Chronic kidney disease, stage 4 (severe): Secondary | ICD-10-CM | POA: Insufficient documentation

## 2020-05-04 DIAGNOSIS — Z7189 Other specified counseling: Secondary | ICD-10-CM | POA: Diagnosis not present

## 2020-05-04 DIAGNOSIS — J449 Chronic obstructive pulmonary disease, unspecified: Secondary | ICD-10-CM

## 2020-05-04 DIAGNOSIS — E039 Hypothyroidism, unspecified: Secondary | ICD-10-CM | POA: Insufficient documentation

## 2020-05-04 DIAGNOSIS — K51919 Ulcerative colitis, unspecified with unspecified complications: Secondary | ICD-10-CM

## 2020-05-04 DIAGNOSIS — K5190 Ulcerative colitis, unspecified, without complications: Secondary | ICD-10-CM | POA: Insufficient documentation

## 2020-05-04 DIAGNOSIS — Z7901 Long term (current) use of anticoagulants: Secondary | ICD-10-CM | POA: Diagnosis not present

## 2020-05-04 NOTE — Assessment & Plan Note (Signed)
In my opinion, she has uncontrolled ulcerative colitis She have at least 5-6 bowel movement on a good day and 10-12 times on a bad day It appears to me that her ulcerative colitis is not under good control She is a very poor historian She have received recent treatment at [facility] with Vedolizumab She had received 3 courses of steroids this year to control her ulcerative colitis I think this needs to be better control before we embark on a journey of starting her on chemotherapy

## 2020-05-04 NOTE — Assessment & Plan Note (Addendum)
She has severe chronic kidney disease stage IV, likely caused by severe dehydration from chronic diarrhea related to ulcerative colitis She is afraid to drink more water because she is afraid that increase oral fluid intake might precipitate congestive heart failure In fact, she is very surprised when I told her that she have chronic kidney disease stage IV She has not been establish with a nephrologist; in fact, she was very upset when I told her she have severe chronic kidney disease stage IV  This is a limiting factor for prescribing aggressive chemotherapy There is a risk of worsening renal failure while on treatment With the permission, I spoke with her primary care doctor and will set up referral for her to see a local nephrologist for further management

## 2020-05-04 NOTE — Assessment & Plan Note (Signed)
She has bilateral lower extremity edema Her blood pressure is low I suspect she is somewhat dehydrated She will continue medical management by cardiologist

## 2020-05-04 NOTE — Assessment & Plan Note (Signed)
This is multifactorial, likely secondary to chronic kidney disease and recent surgery Observe closely for now

## 2020-05-04 NOTE — Assessment & Plan Note (Signed)
She is oxygen dependent She will continue oxygen therapy and diuretic therapy as directed by cardiologist
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
## 2020-05-04 NOTE — Assessment & Plan Note (Addendum)
She has incomplete optimal debulking surgery Her CT imaging is almost 81 months old The patient is frail with multiple co-morbidities She would need repeat baseline imaging study before we proceed with treatment However, I am concerned about her uncontrolled chronic diarrhea from ulcerative colitis along with her stage IV chronic kidney disease CT imaging with oral contrast can precipitate severe diarrhea and this could potentially hurt her  I am doubtful she can tolerate chemotherapy at her current state The patient is quite upset with today's interaction because she came in hopeful that chemotherapy will prolong her survival without major toxicities She was told by her GYN surgeon that treatment is not too toxic in general  I recommend optimizing her care by getting her ulcerative colitis under control and having her establish with nephrologist first before consideration of chemotherapy She has appointment set up next week to see our local GYN surgeon and genetic counselor I will update the local GYN surgeon about her case and will set up future appointment once her medical conditions are optimized Alternatively, if she wants to return to her GYN surgeon at [facility] for treatment, we can arrange for her to transfer her care back to [facility] as well

## 2020-05-04 NOTE — Assessment & Plan Note (Signed)
She has multiple comorbidities that are active and not under good control Personally, I would not want to order imaging study or prescribe treatment until some of her comorbidities are under good control I will set up follow-up appointment in the future

## 2020-05-05 ENCOUNTER — Telehealth: Payer: Self-pay

## 2020-05-05 ENCOUNTER — Encounter: Payer: Self-pay | Admitting: Gynecologic Oncology

## 2020-05-05 NOTE — Telephone Encounter (Signed)
RN spoke with Arrie Aran at [facility] regarding request for referral.   Referral successfully faxed to [phone].

## 2020-05-05 NOTE — Telephone Encounter (Signed)
TC to patient to review meaningful use.  Patient stated she was unaware of gyn onc appointment and is not planning on chemo at this time.  Patient is focusing on kidney disease.

## 2020-05-05 NOTE — Assessment & Plan Note (Signed)
The calculated dose of chemotherapy would come back fairly high given her high BMI Again, as above, I am not comfortable treating her at this point in time due to major medical health issues If she were to start chemotherapy, she would need upfront dose adjustment to minimize toxicity

## 2020-05-08 ENCOUNTER — Inpatient Hospital Stay: Payer: Medicare Other | Admitting: Gynecologic Oncology

## 2020-05-09 ENCOUNTER — Telehealth: Payer: Self-pay | Admitting: Oncology

## 2020-05-09 DIAGNOSIS — J449 Chronic obstructive pulmonary disease, unspecified: Secondary | ICD-10-CM | POA: Diagnosis not present

## 2020-05-09 DIAGNOSIS — I5020 Unspecified systolic (congestive) heart failure: Secondary | ICD-10-CM | POA: Diagnosis not present

## 2020-05-09 DIAGNOSIS — M6281 Muscle weakness (generalized): Secondary | ICD-10-CM | POA: Diagnosis not present

## 2020-05-09 DIAGNOSIS — R52 Pain, unspecified: Secondary | ICD-10-CM | POA: Diagnosis not present

## 2020-05-09 NOTE — Telephone Encounter (Signed)
Jamie Montoya called and said she has an appointment with the nephrologist next Thursday.  She also said that she doesn't know how Dr. Alvy Bimler knew she is in kidney failure when she hadn't heard this before.  Advised her that her records from Dr. Clarene Essex are available and that Dr. Elson Areas can see her lab results in the medical record.  She also wanted to know what the plan going forward and how fast her cancer will grow.  Reviewed that she needs to see the nephrologist to see if she can get chemotherapy first and then discuss options with Dr. Alvy Bimler.

## 2020-05-10 DIAGNOSIS — I509 Heart failure, unspecified: Secondary | ICD-10-CM | POA: Diagnosis not present

## 2020-05-10 DIAGNOSIS — Z9981 Dependence on supplemental oxygen: Secondary | ICD-10-CM | POA: Diagnosis not present

## 2020-05-10 DIAGNOSIS — J449 Chronic obstructive pulmonary disease, unspecified: Secondary | ICD-10-CM | POA: Diagnosis not present

## 2020-05-10 DIAGNOSIS — E039 Hypothyroidism, unspecified: Secondary | ICD-10-CM | POA: Diagnosis not present

## 2020-05-10 DIAGNOSIS — N183 Chronic kidney disease, stage 3 unspecified: Secondary | ICD-10-CM | POA: Diagnosis not present

## 2020-05-10 DIAGNOSIS — Z7901 Long term (current) use of anticoagulants: Secondary | ICD-10-CM | POA: Diagnosis not present

## 2020-05-11 ENCOUNTER — Telehealth: Payer: Self-pay | Admitting: Genetic Counselor

## 2020-05-11 NOTE — Telephone Encounter (Signed)
Jamie Montoya called to discuss her upcoming genetic counseling appointment on Monday 11/22.  We discussed the benefits of genetic counseling and testing in terms of gaining information about cancer risks and providing information to family members.  She wished to proceed with canceling her genetics appointment at this time but knows to reach out to [place] or contact her oncology providers if she is interested in pursuing genetic counseling and/or testing in the future.

## 2020-05-12 ENCOUNTER — Telehealth: Payer: Self-pay

## 2020-05-12 NOTE — Telephone Encounter (Signed)
-----   Message from Heath Lark, MD sent at 05/12/2020  7:55 AM EST ----- Regarding: nephrology appt Can you check and see if her appt has been scheduled?

## 2020-05-12 NOTE — Telephone Encounter (Signed)
Called and left below message asking her to call the office back.

## 2020-05-15 ENCOUNTER — Inpatient Hospital Stay: Payer: Medicare Other

## 2020-05-15 ENCOUNTER — Inpatient Hospital Stay: Payer: Medicare Other | Admitting: Genetic Counselor

## 2020-05-16 DIAGNOSIS — R269 Unspecified abnormalities of gait and mobility: Secondary | ICD-10-CM | POA: Diagnosis not present

## 2020-05-16 DIAGNOSIS — J449 Chronic obstructive pulmonary disease, unspecified: Secondary | ICD-10-CM | POA: Diagnosis not present

## 2020-05-16 DIAGNOSIS — I5030 Unspecified diastolic (congestive) heart failure: Secondary | ICD-10-CM | POA: Diagnosis not present

## 2020-05-16 DIAGNOSIS — I82499 Acute embolism and thrombosis of other specified deep vein of unspecified lower extremity: Secondary | ICD-10-CM | POA: Diagnosis not present

## 2020-05-16 DIAGNOSIS — G4733 Obstructive sleep apnea (adult) (pediatric): Secondary | ICD-10-CM | POA: Diagnosis not present

## 2020-05-17 DIAGNOSIS — Z9981 Dependence on supplemental oxygen: Secondary | ICD-10-CM | POA: Diagnosis not present

## 2020-05-17 DIAGNOSIS — I509 Heart failure, unspecified: Secondary | ICD-10-CM | POA: Diagnosis not present

## 2020-05-17 DIAGNOSIS — N184 Chronic kidney disease, stage 4 (severe): Secondary | ICD-10-CM | POA: Diagnosis not present

## 2020-05-17 DIAGNOSIS — I5032 Chronic diastolic (congestive) heart failure: Secondary | ICD-10-CM | POA: Diagnosis not present

## 2020-05-17 DIAGNOSIS — E039 Hypothyroidism, unspecified: Secondary | ICD-10-CM | POA: Diagnosis not present

## 2020-05-17 DIAGNOSIS — N183 Chronic kidney disease, stage 3 unspecified: Secondary | ICD-10-CM | POA: Diagnosis not present

## 2020-05-17 DIAGNOSIS — Z7901 Long term (current) use of anticoagulants: Secondary | ICD-10-CM | POA: Diagnosis not present

## 2020-05-17 DIAGNOSIS — J449 Chronic obstructive pulmonary disease, unspecified: Secondary | ICD-10-CM | POA: Diagnosis not present

## 2020-05-23 DIAGNOSIS — Z9981 Dependence on supplemental oxygen: Secondary | ICD-10-CM | POA: Diagnosis not present

## 2020-05-23 DIAGNOSIS — E039 Hypothyroidism, unspecified: Secondary | ICD-10-CM | POA: Diagnosis not present

## 2020-05-23 DIAGNOSIS — Z7901 Long term (current) use of anticoagulants: Secondary | ICD-10-CM | POA: Diagnosis not present

## 2020-05-23 DIAGNOSIS — J449 Chronic obstructive pulmonary disease, unspecified: Secondary | ICD-10-CM | POA: Diagnosis not present

## 2020-05-23 DIAGNOSIS — N183 Chronic kidney disease, stage 3 unspecified: Secondary | ICD-10-CM | POA: Diagnosis not present

## 2020-05-23 DIAGNOSIS — I509 Heart failure, unspecified: Secondary | ICD-10-CM | POA: Diagnosis not present

## 2020-05-24 DIAGNOSIS — I509 Heart failure, unspecified: Secondary | ICD-10-CM | POA: Diagnosis not present

## 2020-05-24 DIAGNOSIS — J449 Chronic obstructive pulmonary disease, unspecified: Secondary | ICD-10-CM | POA: Diagnosis not present

## 2020-05-24 DIAGNOSIS — E039 Hypothyroidism, unspecified: Secondary | ICD-10-CM | POA: Diagnosis not present

## 2020-05-24 DIAGNOSIS — Z7901 Long term (current) use of anticoagulants: Secondary | ICD-10-CM | POA: Diagnosis not present

## 2020-05-24 DIAGNOSIS — N183 Chronic kidney disease, stage 3 unspecified: Secondary | ICD-10-CM | POA: Diagnosis not present

## 2020-05-24 DIAGNOSIS — Z9981 Dependence on supplemental oxygen: Secondary | ICD-10-CM | POA: Diagnosis not present

## 2020-05-29 DIAGNOSIS — E039 Hypothyroidism, unspecified: Secondary | ICD-10-CM | POA: Diagnosis not present

## 2020-05-29 DIAGNOSIS — Z9981 Dependence on supplemental oxygen: Secondary | ICD-10-CM | POA: Diagnosis not present

## 2020-05-29 DIAGNOSIS — J449 Chronic obstructive pulmonary disease, unspecified: Secondary | ICD-10-CM | POA: Diagnosis not present

## 2020-05-29 DIAGNOSIS — N183 Chronic kidney disease, stage 3 unspecified: Secondary | ICD-10-CM | POA: Diagnosis not present

## 2020-05-29 DIAGNOSIS — I509 Heart failure, unspecified: Secondary | ICD-10-CM | POA: Diagnosis not present

## 2020-05-29 DIAGNOSIS — Z7901 Long term (current) use of anticoagulants: Secondary | ICD-10-CM | POA: Diagnosis not present

## 2020-06-06 DIAGNOSIS — Z79899 Other long term (current) drug therapy: Secondary | ICD-10-CM | POA: Diagnosis not present

## 2020-06-06 DIAGNOSIS — K51011 Ulcerative (chronic) pancolitis with rectal bleeding: Secondary | ICD-10-CM | POA: Diagnosis not present

## 2020-06-08 DIAGNOSIS — M6281 Muscle weakness (generalized): Secondary | ICD-10-CM | POA: Diagnosis not present

## 2020-06-08 DIAGNOSIS — I5020 Unspecified systolic (congestive) heart failure: Secondary | ICD-10-CM | POA: Diagnosis not present

## 2020-06-08 DIAGNOSIS — J449 Chronic obstructive pulmonary disease, unspecified: Secondary | ICD-10-CM | POA: Diagnosis not present

## 2020-06-08 DIAGNOSIS — R52 Pain, unspecified: Secondary | ICD-10-CM | POA: Diagnosis not present
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
## 2020-06-14 ENCOUNTER — Telehealth: Payer: Self-pay | Admitting: Oncology

## 2020-06-14 NOTE — Telephone Encounter (Signed)
Jamie Montoya and scheduled an appointment with Dr. Alvy Bimler on 07/03/20 at 2:00 to discuss her plan going forward.

## 2020-06-15 DIAGNOSIS — G4733 Obstructive sleep apnea (adult) (pediatric): Secondary | ICD-10-CM | POA: Diagnosis not present

## 2020-06-15 DIAGNOSIS — I82499 Acute embolism and thrombosis of other specified deep vein of unspecified lower extremity: Secondary | ICD-10-CM | POA: Diagnosis not present

## 2020-06-15 DIAGNOSIS — R269 Unspecified abnormalities of gait and mobility: Secondary | ICD-10-CM | POA: Diagnosis not present

## 2020-06-15 DIAGNOSIS — I5030 Unspecified diastolic (congestive) heart failure: Secondary | ICD-10-CM | POA: Diagnosis not present

## 2020-06-15 DIAGNOSIS — J449 Chronic obstructive pulmonary disease, unspecified: Secondary | ICD-10-CM | POA: Diagnosis not present

## 2020-06-27 ENCOUNTER — Encounter: Payer: Self-pay | Admitting: Nurse Practitioner

## 2020-06-27 ENCOUNTER — Ambulatory Visit (INDEPENDENT_AMBULATORY_CARE_PROVIDER_SITE_OTHER): Payer: Medicare Other | Admitting: Nurse Practitioner

## 2020-06-27 ENCOUNTER — Encounter (INDEPENDENT_AMBULATORY_CARE_PROVIDER_SITE_OTHER): Payer: Self-pay

## 2020-06-27 ENCOUNTER — Other Ambulatory Visit: Payer: Self-pay

## 2020-06-27 VITALS — BP 100/54 | HR 48 | Ht 64.0 in | Wt 247.0 lb

## 2020-06-27 DIAGNOSIS — I5032 Chronic diastolic (congestive) heart failure: Secondary | ICD-10-CM

## 2020-06-27 DIAGNOSIS — E785 Hyperlipidemia, unspecified: Secondary | ICD-10-CM

## 2020-06-27 DIAGNOSIS — I480 Paroxysmal atrial fibrillation: Secondary | ICD-10-CM

## 2020-06-27 NOTE — Patient Instructions (Addendum)
After Visit Summary:  We will be checking the following labs today - BMET, CBC, TSH   Medication Instructions:    Continue with your current medicines. BUT  We will stop the Metolazone.    If you need a refill on your cardiac medications before your next appointment, please call your pharmacy.     Testing/Procedures To Be Arranged:  N/A  Follow-Up:   See Dr. Meda Coffee in about 4 months.     At [facility], you and your health needs are our priority.  As part of our continuing mission to provide you with exceptional heart care, we have created designated Provider Care Teams.  These Care Teams include your primary Cardiologist (physician) and Advanced Practice Providers (APPs -  Physician Assistants and Nurse Practitioners) who all work together to provide you with the care you need, when you need it.  Special Instructions:  . Stay safe, wash your hands for at least 20 seconds and wear a mask when needed.  . It was good to talk with you today.    Call the [place] office at [phone] if you have any questions, problems or concerns.

## 2020-06-28 LAB — BASIC METABOLIC PANEL WITH GFR
BUN/Creatinine Ratio: 39 — ABNORMAL HIGH (ref 12–28)
BUN: 81 mg/dL (ref 8–27)
CO2: 29 mmol/L (ref 20–29)
Calcium: 9.1 mg/dL (ref 8.7–10.3)
Chloride: 97 mmol/L (ref 96–106)
Creatinine, Ser: 2.08 mg/dL — ABNORMAL HIGH (ref 0.57–1.00)
GFR calc Af Amer: 26 mL/min/{1.73_m2} — ABNORMAL LOW
GFR calc non Af Amer: 23 mL/min/{1.73_m2} — ABNORMAL LOW
Glucose: 77 mg/dL (ref 65–99)
Potassium: 4.3 mmol/L (ref 3.5–5.2)
Sodium: 139 mmol/L (ref 134–144)

## 2020-06-28 LAB — CBC
Hematocrit: 28.5 % — ABNORMAL LOW (ref 34.0–46.6)
Hemoglobin: 9 g/dL — ABNORMAL LOW (ref 11.1–15.9)
MCH: 29.6 pg (ref 26.6–33.0)
MCHC: 31.6 g/dL (ref 31.5–35.7)
MCV: 94 fL (ref 79–97)
Platelets: 115 10*3/uL — ABNORMAL LOW (ref 150–450)
RBC: 3.04 x10E6/uL — ABNORMAL LOW (ref 3.77–5.28)
RDW: 12.4 % (ref 11.7–15.4)
WBC: 3.3 10*3/uL — ABNORMAL LOW (ref 3.4–10.8)

## 2020-06-28 LAB — TSH: TSH: 0.415 u[IU]/mL — ABNORMAL LOW (ref 0.450–4.500)

## 2020-07-03 ENCOUNTER — Inpatient Hospital Stay: Payer: Medicare Other | Attending: Hematology and Oncology | Admitting: Hematology and Oncology

## 2020-07-03 ENCOUNTER — Other Ambulatory Visit: Payer: Self-pay

## 2020-07-03 ENCOUNTER — Encounter: Payer: Self-pay | Admitting: Hematology and Oncology

## 2020-07-03 ENCOUNTER — Telehealth: Payer: Self-pay | Admitting: Oncology

## 2020-07-03 DIAGNOSIS — I5032 Chronic diastolic (congestive) heart failure: Secondary | ICD-10-CM | POA: Insufficient documentation

## 2020-07-03 DIAGNOSIS — N184 Chronic kidney disease, stage 4 (severe): Secondary | ICD-10-CM | POA: Insufficient documentation

## 2020-07-03 DIAGNOSIS — I959 Hypotension, unspecified: Secondary | ICD-10-CM | POA: Insufficient documentation

## 2020-07-03 DIAGNOSIS — K51919 Ulcerative colitis, unspecified with unspecified complications: Secondary | ICD-10-CM

## 2020-07-03 DIAGNOSIS — K5190 Ulcerative colitis, unspecified, without complications: Secondary | ICD-10-CM | POA: Insufficient documentation

## 2020-07-03 DIAGNOSIS — C561 Malignant neoplasm of right ovary: Secondary | ICD-10-CM | POA: Diagnosis present

## 2020-07-03 DIAGNOSIS — Z7189 Other specified counseling: Secondary | ICD-10-CM

## 2020-07-03 DIAGNOSIS — Z79899 Other long term (current) drug therapy: Secondary | ICD-10-CM | POA: Insufficient documentation

## 2020-07-03 DIAGNOSIS — Z9981 Dependence on supplemental oxygen: Secondary | ICD-10-CM | POA: Insufficient documentation

## 2020-07-03 DIAGNOSIS — R197 Diarrhea, unspecified: Secondary | ICD-10-CM | POA: Diagnosis not present

## 2020-07-03 NOTE — Assessment & Plan Note (Signed)
She has recent acute on chronic heart failure I will defer to her cardiologist for medical management

## 2020-07-03 NOTE — Telephone Encounter (Signed)
Jamie Montoya regarding her appointment this afternoon. Discussed that Dr. Alvy Bimler saw that she is under hospice care and is wondering if she still wants to keep the appointment. Jamie Montoya said she does want to keep the appointment.  Advised her to arrive early at 1:30 to check in.

## 2020-07-03 NOTE — Assessment & Plan Note (Signed)
I have discussion of goals of care with the patient and caregivers She is currently enrolled in hospice I felt that the patient continues to have difficulties understanding the role of chemotherapy While chemotherapy could potentially extend her life span, with her significant major comorbidities, she would likely suffer major complications that could shorten her life I would not prescribe chemotherapy in an adjuvant fashion, rather in a palliative fashion if we have documentation of recurrence of disease on imaging

## 2020-07-03 NOTE — Progress Notes (Signed)
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[place]) Overall, her performance status is still very poor She has unstable vital signs with hypotension today She is currently enrolled in hospice care and felt better compared to 2 months ago In my opinion, the patient still have multiple organ failure including respiratory failure, kidney failure, heart failure, uncontrolled diarrhea secondary to inflammatory bowel disease and pancytopenia I tried to explain to the patient and her caregiver many times that chemotherapy or even imaging studies would likely cause decline in quality of life and could exacerbate one of the organ failure leading to premature death, sooner than recurrence of disease itself Ultimately, she is in agreement to be observed  If she continues to improve, we can consider CT imaging in the future We discussed the limitation of CT imaging without IV contrast or oral contrast IV contrast could cause exacerbation of renal failure and oral contrast could exacerbate her diarrhea She would like to have virtual visit in the future due for difficulties with traveling  Ulcerative colitis, chronic ([place]) According to the patient, she had recent exacerbation of ulcerative colitis It could be related to her oral iron supplement Recommend consideration to start oral iron supplement because I felt that her anemia is likely due to chronic renal failure  Chronic diastolic CHF (congestive heart failure) ([place]) She has recent acute on chronic heart failure I will defer to her cardiologist for medical management  Chronic kidney disease (CKD), stage IV (severe) ([place]) I have reviewed consult  note from nephrology She has stage IV chronic kidney disease that is currently stable I told her the risk of progression to end-stage renal failure if we pursue chemotherapy For now, I recommend close observation  Goals of care, counseling/discussion I have discussion of goals of care with the patient and caregivers She is currently enrolled in hospice I felt that the patient continues to have difficulties understanding the role of chemotherapy While chemotherapy could potentially extend her life span, with her significant major comorbidities, she would likely suffer major complications that could shorten her life I would not prescribe chemotherapy in an adjuvant fashion, rather in a palliative fashion if we have documentation of recurrence of disease on imaging   No orders of the defined types were placed in this encounter.   All questions were answered. The patient knows to call the clinic with any problems, questions or concerns. The total time spent in the appointment was 40 minutes encounter with patients including review of chart and various tests results, discussions about plan of care and coordination of care plan   Heath Lark, MD 07/03/2020 3:34 PM  INTERVAL HISTORY: Please see below for problem oriented charting. She returns with caregivers for further follow-up The purpose of today's visit is to continue discussion about goals of care I have reviewed documentation by nephrologist, gastroenterologist and cardiologist She has appointment to see her primary care doctor this week Overall, she felt slightly stronger Her diarrhea is reduced to approximately 4 times a day or more Her blood pressure is noted to be low and the patient is afraid to eat anything because she usually have postprandial diarrhea within 10 minutes of ingestion of food She has shortness of breath at baseline  and uses 3 L of oxygen continuously She had recent changes to her medications by cardiologist for  heart failure She denies recent fever or chills She felt that her diarrhea could be exacerbated by recent oral iron supplement and is wondering why taking oral iron supplement did not help with her anemia No recent fever, chills or antibiotic treatment Despite her frequent postprandial diarrhea, her weight remains the same SUMMARY OF ONCOLOGIC HISTORY: Oncology History Overview Note  Suboptimal debulking due to high risk for prolonged anesthesia, high grade serous, ER/PR 50% positive, MSI stable   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 Interval increase in right adnexal solid appearing mass, incompletely characterized without IV contrast. Borderline enlarged left pelvic sidewall node, indeterminate.  2. Endometrial thickening, increased from prior MRI. Correlation with endometrial biopsy is recommended as endometrial carcinoma remains in the differential.  3. Mild enlargement of the left ovary for age with a globular medial aspect. Consider repeat pelvic MRI or pelvic ultrasound for further evaluation and to exclude a contralateral adnexal lesion.   03/01/2020 Initial Diagnosis   This 75 year old woman represents for evaluation of an adnexal mass. She has a PMH significant for obesity (BMI 41.8), COPD with home oxygen requirement, chronic diastolic CHF, poorly controlled hypothyroidism, stage III CKD and a solid right adnexal mass + elevated tumor markers initially diagnosed in the Fall of 2020 during an acute illness for which she was discharged on hospice. The patient has a history of ulcerative colitis treated in the past. There has been a significant delay in obtaining follow-up evaluation. Colonoscopy performed earlier this summer showing active colitis.Marland Kitchen She was placed on a steroid taper beginning the last week of August..   03/30/2020 Pathology Results   Outside path from [place] or [place] or PRIMARY PERITONEUM  (OVARY OR  FALLOPIAN TUBE OR PRIMARY PERITONEUM - All Specimens)   8th Edition - Protocol posted: 08/19/2018   SPECIMEN     Procedure:    Right salpingo-oophorectomy     Procedure:    Left salpingo-oophorectomy     Procedure:    endometrial biopsy     Specimen Integrity of the Right Fallopian Tube:    Removed intact in laparoscopy bag   TUMOR   Tumor Site:    Right fallopian tube   Histologic Type:    Serous carcinoma   Histologic Grade:    High grade   Tumor Size:    Greatest Dimension (Centimeters): 13.3 cm   Ovarian Surface Involvement:    Absent   Fallopian Tube Surface Involvement:    Absent   Other Tissue / Organ Involvement:    Right ovary   Other Tissue / Organ Involvement:    Left ovary    Peritoneal / Ascitic Fluid:    Malignant (positive for malignancy)   Pleural Fluid:    Not submitted / unknown   Treatment Effect:    No known presurgical therapy   LYMPH NODES   Regional Lymph Nodes:    No lymph nodes submitted or found   PATHOLOGIC STAGE CLASSIFICATION (pTNM, AJCC 8th Edition)   Primary Tumor (pT):    pT1c3   Regional Lymph Nodes (pN):    pNX   Estrogen receptor (ER):    POSITIVE    Percentage of cells staining: 50%   Progesterone receptor (PR):    POSITIVE    Percentage of cells staining: 50%   HER2/neu by immunohistochemistry:    NEGATIVE  Score: 0   Immunohistochemical staining for mismatch repair (MMR) proteins by immunohistochemistry:   MLH1: Equivocal nuclear staining MSH2: Intact nuclear staining MSH6: Intact nuclear staining PMS2: Intact nuclear staining    Surgery   Procedures: ROBOTIC XI SALPINGO-OOPHORECTOMY, COMPLETE OR PARTIAL, UNILATERAL OR BILATERAL (SEPARATE PROCEDURE): 58720 (CPT) Endometrial Sampling (Biopsy) W/Wo Endocervical Sampling (Biopsy), Without Cervical Dilation, Any Method: 58100 (CPT)  22 modifier: Obesity, COPD, CHF required > 1 hr for positioning and placement of arterial line and monitors. Obesity and intra-abdominal  adhesions limited visualization while COPD limited the amount of intra-abdominal pressure and Trendelenberg which also limited exposure and increased the operative time significantly.  Surgeon: Cindie Laroche, MD  Findings: Normal external genitalia. On bimanual, right adnexal fullness is appreciated, difficult to distinguish uterus due to habitus. Mass/uterus mobile. On laparoscopy, the upper abdomen and visualized omentum and bowel were normal appearing. There were adhesions of the omentum and small bowel to the anterior abdominal wall requiring LOA for approximately 20 minutes for visualization. The right ovary was enlarged to ~13cm, solid, with a smooth but irregular surface. The bilateral fallopian tubes and uterus were normal appearing. Left ovary enlarged but not obviously abnormal.     05/04/2020 Cancer Staging   Staging form: Ovary, Fallopian Tube, and Primary Peritoneal Carcinoma, AJCC 8th Edition - Clinical stage from 05/04/2020: FIGO Stage IC2 - Signed by Heath Lark, MD on 05/04/2020     REVIEW OF SYSTEMS:   Constitutional: Denies fevers, chills or abnormal weight loss Eyes: Denies blurriness of vision Ears, nose, mouth, throat, and face: Denies mucositis or sore throat Cardiovascular: Denies palpitation, chest discomfort or lower extremity swelling Skin: Denies abnormal skin rashes Lymphatics: Denies new lymphadenopathy or easy bruising Neurological:Denies numbness, tingling or new weaknesses Behavioral/Psych: Mood is stable, no new changes  All other systems were reviewed with the patient and are negative.  I have reviewed the past medical history, past surgical history, social history and family history with the patient and they are unchanged from previous note.  ALLERGIES:  is allergic to tape.  MEDICATIONS:  Current Outpatient Medications  Medication Sig Dispense Refill  . acetaminophen (TYLENOL) 500 MG tablet Take 500 mg by mouth every 6 (six) hours as needed.    Marland Kitchen  albuterol (PROVENTIL) (2.5 MG/3ML) 0.083% nebulizer solution Take 3 mLs by nebulization 4 (four) times daily as needed for wheezing or shortness of breath.     Marland Kitchen albuterol (VENTOLIN HFA) 108 (90 Base) MCG/ACT inhaler Inhale 2 puffs into the lungs every 6 (six) hours as needed for wheezing or shortness of breath. 8 g 2  . Biotin 5000 MCG TABS Take 1 tablet by mouth in the morning.     . ferrous sulfate 325 (65 FE) MG tablet Take 325 mg by mouth daily with breakfast.    . levothyroxine (SYNTHROID) 175 MCG tablet Take 175 mcg by mouth daily.    . metoprolol tartrate (LOPRESSOR) 50 MG tablet Take 25 mg by mouth daily.    . NON FORMULARY Take 2 tablets by mouth daily. Soursop (antioxidant)    . nystatin (MYCOSTATIN/NYSTOP) powder Apply 1 application topically daily as needed.     . torsemide (DEMADEX) 20 MG tablet Take 2 tablets (40 mg total) by mouth in the AM, then take 1 tablet (20 mg total) by mouth in the PM, everyday. (Patient taking differently: 20 mg daily. Take 2 tablets (40 mg total) by mouth in the AM, then take 1 tablet (20 mg total) by mouth in the PM, everyday.)  270 tablet 0   No current facility-administered medications for this visit.    PHYSICAL EXAMINATION: ECOG PERFORMANCE STATUS: 2 - Symptomatic, <50% confined to bed  Vitals:   07/03/20 1401  BP: (!) 97/34  Pulse: 60  Resp: 18  SpO2: 96%   Filed Weights   07/03/20 1401  Weight: 249 lb (112.9 kg)    GENERAL:alert, no distress and comfortable.  She is seen on the wheelchair, with oxygen delivered via nasal cannula  NEURO: alert & oriented x 3 with fluent speech, no focal motor/sensory deficits.  Noted poor hearing  LABORATORY DATA:  I have reviewed the data as listed    Component Value Date/Time   NA 139 06/27/2020 1519   K 4.3 06/27/2020 1519   CL 97 06/27/2020 1519   CO2 29 06/27/2020 1519   GLUCOSE 77 06/27/2020 1519   GLUCOSE 238 (H) 11/30/2019 0454   BUN 81 (HH) 06/27/2020 1519   CREATININE 2.08 (H)  06/27/2020 1519   CALCIUM 9.1 06/27/2020 1519   PROT 7.1 04/24/2020 1352   ALBUMIN 4.1 04/24/2020 1352   AST 15 04/24/2020 1352   ALT 16 04/24/2020 1352   ALKPHOS 110 04/24/2020 1352   BILITOT 0.3 04/24/2020 1352   GFRNONAA 23 (L) 06/27/2020 1519   GFRAA 26 (L) 06/27/2020 1519    No results found for: SPEP, UPEP  Lab Results  Component Value Date   WBC 3.3 (L) 06/27/2020   NEUTROABS 3.1 12/04/2016   HGB 9.0 (L) 06/27/2020   HCT 28.5 (L) 06/27/2020   MCV 94 06/27/2020   PLT 115 (L) 06/27/2020      Chemistry      Component Value Date/Time   NA 139 06/27/2020 1519   K 4.3 06/27/2020 1519   CL 97 06/27/2020 1519   CO2 29 06/27/2020 1519   BUN 81 (HH) 06/27/2020 1519   CREATININE 2.08 (H) 06/27/2020 1519      Component Value Date/Time   CALCIUM 9.1 06/27/2020 1519   ALKPHOS 110 04/24/2020 1352   AST 15 04/24/2020 1352   ALT 16 04/24/2020 1352   BILITOT 0.3 04/24/2020 1352

## 2020-07-03 NOTE — Assessment & Plan Note (Signed)
Overall, her performance status is still very poor She has unstable vital signs with hypotension today She is currently enrolled in hospice care and felt better compared to 2 months ago In my opinion, the patient still have multiple organ failure including respiratory failure, kidney failure, heart failure, uncontrolled diarrhea secondary to inflammatory bowel disease and pancytopenia I tried to explain to the patient and her caregiver many times that chemotherapy or even imaging studies would likely cause decline in quality of life and could exacerbate one of the organ failure leading to premature death, sooner than recurrence of disease itself Ultimately, she is in agreement to be observed  If she continues to improve, we can consider CT imaging in the future We discussed the limitation of CT imaging without IV contrast or oral contrast IV contrast could cause exacerbation of renal failure and oral contrast could exacerbate her diarrhea She would like to have virtual visit in the future due for difficulties with traveling

## 2020-07-03 NOTE — Assessment & Plan Note (Signed)
According to the patient, she had recent exacerbation of ulcerative colitis It could be related to her oral iron supplement Recommend consideration to start oral iron supplement because I felt that her anemia is likely due to chronic renal failure

## 2020-07-03 NOTE — Assessment & Plan Note (Signed)
I have reviewed consult note from nephrology She has stage IV chronic kidney disease that is currently stable I told her the risk of progression to end-stage renal failure if we pursue chemotherapy For now, I recommend close observation

## 2020-07-04 DIAGNOSIS — J9611 Chronic respiratory failure with hypoxia: Secondary | ICD-10-CM | POA: Diagnosis not present

## 2020-07-04 DIAGNOSIS — E039 Hypothyroidism, unspecified: Secondary | ICD-10-CM | POA: Diagnosis not present

## 2020-07-04 DIAGNOSIS — I480 Paroxysmal atrial fibrillation: Secondary | ICD-10-CM | POA: Diagnosis not present

## 2020-07-04 DIAGNOSIS — C763 Malignant neoplasm of pelvis: Secondary | ICD-10-CM | POA: Diagnosis not present

## 2020-07-04 DIAGNOSIS — B372 Candidiasis of skin and nail: Secondary | ICD-10-CM | POA: Diagnosis not present

## 2020-07-04 DIAGNOSIS — K5190 Ulcerative colitis, unspecified, without complications: Secondary | ICD-10-CM | POA: Diagnosis not present

## 2020-07-04 DIAGNOSIS — I5030 Unspecified diastolic (congestive) heart failure: Secondary | ICD-10-CM | POA: Diagnosis not present

## 2020-07-04 DIAGNOSIS — N184 Chronic kidney disease, stage 4 (severe): Secondary | ICD-10-CM | POA: Diagnosis not present

## 2020-07-04 DIAGNOSIS — J449 Chronic obstructive pulmonary disease, unspecified: Secondary | ICD-10-CM | POA: Diagnosis not present

## 2020-07-09 DIAGNOSIS — R52 Pain, unspecified: Secondary | ICD-10-CM | POA: Diagnosis not present

## 2020-07-09 DIAGNOSIS — M6281 Muscle weakness (generalized): Secondary | ICD-10-CM | POA: Diagnosis not present

## 2020-07-09 DIAGNOSIS — I5020 Unspecified systolic (congestive) heart failure: Secondary | ICD-10-CM | POA: Diagnosis not present

## 2020-07-09 DIAGNOSIS — J449 Chronic obstructive pulmonary disease, unspecified: Secondary | ICD-10-CM | POA: Diagnosis not present

## 2020-07-16 DIAGNOSIS — I82499 Acute embolism and thrombosis of other specified deep vein of unspecified lower extremity: Secondary | ICD-10-CM | POA: Diagnosis not present

## 2020-07-16 DIAGNOSIS — J449 Chronic obstructive pulmonary disease, unspecified: Secondary | ICD-10-CM | POA: Diagnosis not present

## 2020-07-16 DIAGNOSIS — G4733 Obstructive sleep apnea (adult) (pediatric): Secondary | ICD-10-CM | POA: Diagnosis not present

## 2020-07-16 DIAGNOSIS — R269 Unspecified abnormalities of gait and mobility: Secondary | ICD-10-CM | POA: Diagnosis not present

## 2020-07-16 DIAGNOSIS — I5030 Unspecified diastolic (congestive) heart failure: Secondary | ICD-10-CM | POA: Diagnosis not present

## 2020-07-19 NOTE — Progress Notes (Signed)
This encounter was created in error - please disregard.

## 2020-07-24 ENCOUNTER — Telehealth: Payer: Self-pay | Admitting: Oncology

## 2020-07-24 NOTE — Telephone Encounter (Signed)
Called [facility] to she how she is doing and if there have been any changes.  She said she is doing about the same and said nothing is new.  She agrees to continue with hospice.

## 2020-08-01 DIAGNOSIS — K51011 Ulcerative (chronic) pancolitis with rectal bleeding: Secondary | ICD-10-CM | POA: Diagnosis not present

## 2020-08-09 DIAGNOSIS — I5020 Unspecified systolic (congestive) heart failure: Secondary | ICD-10-CM | POA: Diagnosis not present

## 2020-08-09 DIAGNOSIS — J449 Chronic obstructive pulmonary disease, unspecified: Secondary | ICD-10-CM | POA: Diagnosis not present

## 2020-08-09 DIAGNOSIS — R52 Pain, unspecified: Secondary | ICD-10-CM | POA: Diagnosis not present

## 2020-08-09 DIAGNOSIS — M6281 Muscle weakness (generalized): Secondary | ICD-10-CM | POA: Diagnosis not present

## 2020-08-16 DIAGNOSIS — J449 Chronic obstructive pulmonary disease, unspecified: Secondary | ICD-10-CM | POA: Diagnosis not present

## 2020-08-16 DIAGNOSIS — G4733 Obstructive sleep apnea (adult) (pediatric): Secondary | ICD-10-CM | POA: Diagnosis not present

## 2020-08-16 DIAGNOSIS — I82499 Acute embolism and thrombosis of other specified deep vein of unspecified lower extremity: Secondary | ICD-10-CM | POA: Diagnosis not present

## 2020-08-16 DIAGNOSIS — R269 Unspecified abnormalities of gait and mobility: Secondary | ICD-10-CM | POA: Diagnosis not present

## 2020-08-16 DIAGNOSIS — I5030 Unspecified diastolic (congestive) heart failure: Secondary | ICD-10-CM | POA: Diagnosis not present

## 2020-08-17 DIAGNOSIS — D61818 Other pancytopenia: Secondary | ICD-10-CM | POA: Diagnosis not present

## 2020-08-17 DIAGNOSIS — E039 Hypothyroidism, unspecified: Secondary | ICD-10-CM | POA: Diagnosis not present

## 2020-09-07 DIAGNOSIS — E039 Hypothyroidism, unspecified: Secondary | ICD-10-CM | POA: Diagnosis not present

## 2020-09-13 DIAGNOSIS — I5030 Unspecified diastolic (congestive) heart failure: Secondary | ICD-10-CM | POA: Diagnosis not present

## 2020-09-13 DIAGNOSIS — G4733 Obstructive sleep apnea (adult) (pediatric): Secondary | ICD-10-CM | POA: Diagnosis not present

## 2020-09-13 DIAGNOSIS — R269 Unspecified abnormalities of gait and mobility: Secondary | ICD-10-CM | POA: Diagnosis not present

## 2020-09-13 DIAGNOSIS — I82499 Acute embolism and thrombosis of other specified deep vein of unspecified lower extremity: Secondary | ICD-10-CM | POA: Diagnosis not present

## 2020-09-13 DIAGNOSIS — J449 Chronic obstructive pulmonary disease, unspecified: Secondary | ICD-10-CM | POA: Diagnosis not present

## 2020-09-21 DIAGNOSIS — N184 Chronic kidney disease, stage 4 (severe): Secondary | ICD-10-CM | POA: Diagnosis not present

## 2020-09-21 DIAGNOSIS — I5032 Chronic diastolic (congestive) heart failure: Secondary | ICD-10-CM | POA: Diagnosis not present

## 2020-09-26 DIAGNOSIS — K51011 Ulcerative (chronic) pancolitis with rectal bleeding: Secondary | ICD-10-CM | POA: Diagnosis not present

## 2020-10-19 DIAGNOSIS — D631 Anemia in chronic kidney disease: Secondary | ICD-10-CM | POA: Diagnosis not present

## 2020-10-19 DIAGNOSIS — K9049 Malabsorption due to intolerance, not elsewhere classified: Secondary | ICD-10-CM | POA: Diagnosis not present

## 2020-10-26 DIAGNOSIS — K9049 Malabsorption due to intolerance, not elsewhere classified: Secondary | ICD-10-CM | POA: Diagnosis not present

## 2020-10-26 DIAGNOSIS — D631 Anemia in chronic kidney disease: Secondary | ICD-10-CM | POA: Diagnosis not present

## 2020-10-31 DIAGNOSIS — E039 Hypothyroidism, unspecified: Secondary | ICD-10-CM | POA: Diagnosis not present

## 2020-11-02 ENCOUNTER — Ambulatory Visit: Payer: PRIVATE HEALTH INSURANCE | Admitting: Cardiology

## 2020-11-17 ENCOUNTER — Ambulatory Visit: Payer: Medicare Other | Admitting: Cardiology

## 2020-11-21 DIAGNOSIS — K51011 Ulcerative (chronic) pancolitis with rectal bleeding: Secondary | ICD-10-CM | POA: Diagnosis not present
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
## 2020-12-21 DIAGNOSIS — N184 Chronic kidney disease, stage 4 (severe): Secondary | ICD-10-CM | POA: Diagnosis not present

## 2020-12-21 DIAGNOSIS — I5032 Chronic diastolic (congestive) heart failure: Secondary | ICD-10-CM | POA: Diagnosis not present

## 2020-12-21 DIAGNOSIS — D631 Anemia in chronic kidney disease: Secondary | ICD-10-CM | POA: Diagnosis not present

## 2020-12-22 ENCOUNTER — Ambulatory Visit (HOSPITAL_BASED_OUTPATIENT_CLINIC_OR_DEPARTMENT_OTHER): Admitting: Cardiology

## 2020-12-22 ENCOUNTER — Other Ambulatory Visit: Payer: Self-pay

## 2020-12-22 ENCOUNTER — Encounter (HOSPITAL_BASED_OUTPATIENT_CLINIC_OR_DEPARTMENT_OTHER): Payer: Self-pay | Admitting: Cardiology

## 2020-12-22 VITALS — BP 100/60 | HR 57 | Ht 64.0 in | Wt 259.0 lb

## 2020-12-22 DIAGNOSIS — N1832 Chronic kidney disease, stage 3b: Secondary | ICD-10-CM

## 2020-12-22 DIAGNOSIS — I480 Paroxysmal atrial fibrillation: Secondary | ICD-10-CM | POA: Diagnosis not present

## 2020-12-22 DIAGNOSIS — R600 Localized edema: Secondary | ICD-10-CM | POA: Diagnosis not present

## 2020-12-22 DIAGNOSIS — I5032 Chronic diastolic (congestive) heart failure: Secondary | ICD-10-CM | POA: Diagnosis not present

## 2020-12-22 DIAGNOSIS — E785 Hyperlipidemia, unspecified: Secondary | ICD-10-CM

## 2020-12-22 DIAGNOSIS — C561 Malignant neoplasm of right ovary: Secondary | ICD-10-CM

## 2020-12-22 NOTE — Patient Instructions (Signed)
Medication Instructions:  Your physician recommends that you continue on your current medications as directed. Please refer to the Current Medication list given to you today.  *If you need a refill on your cardiac medications before your next appointment, please call your pharmacy*   Lab Work: If you have labs (blood work) drawn today and your tests are completely normal, you will receive your results only by: [place] (if you have MyChart) OR A paper copy in the mail If you have any lab test that is abnormal or we need to change your treatment, we will call you to review the results.  Follow-Up: At [facility], you and your health needs are our priority.  As part of our continuing mission to provide you with exceptional heart care, we have created designated Provider Care Teams.  These Care Teams include your primary Cardiologist (physician) and Advanced Practice Providers (APPs -  Physician Assistants and Nurse Practitioners) who all work together to provide you with the care you need, when you need it.  We recommend signing up for the patient portal called "MyChart".  Sign up information is provided on this After Visit Summary.  MyChart is used to connect with patients for Virtual Visits (Telemedicine).  Patients are able to view lab/test results, encounter notes, upcoming appointments, etc.  Non-urgent messages can be sent to your provider as well.   To learn more about what you can do with MyChart, go to [email or web address].    Your next appointment:   6 month(s)  The format for your next appointment:   In Person  Provider:   You may see Dr. Johney Frame or one of the following Advanced Practice Providers on your designated Care Team:   Richardson Dopp, PA-C [place], [place]

## 2021-01-02 DIAGNOSIS — Z9181 History of falling: Secondary | ICD-10-CM | POA: Diagnosis not present

## 2021-01-02 DIAGNOSIS — B372 Candidiasis of skin and nail: Secondary | ICD-10-CM | POA: Diagnosis not present

## 2021-01-02 DIAGNOSIS — E039 Hypothyroidism, unspecified: Secondary | ICD-10-CM | POA: Diagnosis not present

## 2021-01-02 DIAGNOSIS — Z139 Encounter for screening, unspecified: Secondary | ICD-10-CM | POA: Diagnosis not present

## 2021-01-16 DIAGNOSIS — K51011 Ulcerative (chronic) pancolitis with rectal bleeding: Secondary | ICD-10-CM | POA: Diagnosis not present

## 2021-02-27 ENCOUNTER — Other Ambulatory Visit: Payer: Self-pay | Admitting: *Deleted

## 2021-02-27 MED ORDER — TORSEMIDE 20 MG PO TABS: 20.0000 mg | ORAL_TABLET | Freq: Every morning | ORAL | 1 refills | Status: DC

## 2021-03-13 DIAGNOSIS — K51011 Ulcerative (chronic) pancolitis with rectal bleeding: Secondary | ICD-10-CM | POA: Diagnosis not present

## 2021-04-23 DIAGNOSIS — D631 Anemia in chronic kidney disease: Secondary | ICD-10-CM | POA: Diagnosis not present

## 2021-04-23 DIAGNOSIS — I5032 Chronic diastolic (congestive) heart failure: Secondary | ICD-10-CM | POA: Diagnosis not present

## 2021-04-23 DIAGNOSIS — N184 Chronic kidney disease, stage 4 (severe): Secondary | ICD-10-CM | POA: Diagnosis not present

## 2021-04-23 DIAGNOSIS — E039 Hypothyroidism, unspecified: Secondary | ICD-10-CM | POA: Diagnosis not present

## 2021-05-07 DIAGNOSIS — K51011 Ulcerative (chronic) pancolitis with rectal bleeding: Secondary | ICD-10-CM | POA: Diagnosis not present

## 2021-05-24 ENCOUNTER — Emergency Department (HOSPITAL_COMMUNITY): Payer: Medicare Other

## 2021-05-24 ENCOUNTER — Inpatient Hospital Stay (HOSPITAL_COMMUNITY)
Admission: EM | Admit: 2021-05-24 | Discharge: 2021-05-28 | DRG: 054 | Disposition: A | Discharge status: Home Health | Payer: Medicare Other | Attending: Internal Medicine | Admitting: Internal Medicine

## 2021-05-24 DIAGNOSIS — K5090 Crohn's disease, unspecified, without complications: Secondary | ICD-10-CM | POA: Diagnosis present

## 2021-05-24 DIAGNOSIS — Z7189 Other specified counseling: Secondary | ICD-10-CM | POA: Diagnosis not present

## 2021-05-24 DIAGNOSIS — E441 Mild protein-calorie malnutrition: Secondary | ICD-10-CM | POA: Diagnosis present

## 2021-05-24 DIAGNOSIS — S0990XA Unspecified injury of head, initial encounter: Secondary | ICD-10-CM | POA: Diagnosis not present

## 2021-05-24 DIAGNOSIS — Z8249 Family history of ischemic heart disease and other diseases of the circulatory system: Secondary | ICD-10-CM | POA: Diagnosis not present

## 2021-05-24 DIAGNOSIS — W010XXA Fall on same level from slipping, tripping and stumbling without subsequent striking against object, initial encounter: Secondary | ICD-10-CM | POA: Diagnosis present

## 2021-05-24 DIAGNOSIS — Z6841 Body Mass Index (BMI) 40.0 and over, adult: Secondary | ICD-10-CM

## 2021-05-24 DIAGNOSIS — I5032 Chronic diastolic (congestive) heart failure: Secondary | ICD-10-CM | POA: Diagnosis not present

## 2021-05-24 DIAGNOSIS — G939 Disorder of brain, unspecified: Secondary | ICD-10-CM | POA: Diagnosis present

## 2021-05-24 DIAGNOSIS — K219 Gastro-esophageal reflux disease without esophagitis: Secondary | ICD-10-CM | POA: Diagnosis present

## 2021-05-24 DIAGNOSIS — Z87891 Personal history of nicotine dependence: Secondary | ICD-10-CM

## 2021-05-24 DIAGNOSIS — E785 Hyperlipidemia, unspecified: Secondary | ICD-10-CM | POA: Diagnosis not present

## 2021-05-24 DIAGNOSIS — Z20822 Contact with and (suspected) exposure to covid-19: Secondary | ICD-10-CM | POA: Diagnosis not present

## 2021-05-24 DIAGNOSIS — Z8544 Personal history of malignant neoplasm of other female genital organs: Secondary | ICD-10-CM

## 2021-05-24 DIAGNOSIS — W19XXXA Unspecified fall, initial encounter: Secondary | ICD-10-CM | POA: Diagnosis not present

## 2021-05-24 DIAGNOSIS — R638 Other symptoms and signs concerning food and fluid intake: Secondary | ICD-10-CM | POA: Diagnosis not present

## 2021-05-24 DIAGNOSIS — Y92012 Bathroom of single-family (private) house as the place of occurrence of the external cause: Secondary | ICD-10-CM

## 2021-05-24 DIAGNOSIS — Z993 Dependence on wheelchair: Secondary | ICD-10-CM | POA: Diagnosis not present

## 2021-05-24 DIAGNOSIS — G936 Cerebral edema: Secondary | ICD-10-CM | POA: Diagnosis present

## 2021-05-24 DIAGNOSIS — J449 Chronic obstructive pulmonary disease, unspecified: Secondary | ICD-10-CM | POA: Diagnosis not present

## 2021-05-24 DIAGNOSIS — Z9981 Dependence on supplemental oxygen: Secondary | ICD-10-CM | POA: Diagnosis not present

## 2021-05-24 DIAGNOSIS — R42 Dizziness and giddiness: Secondary | ICD-10-CM | POA: Diagnosis present

## 2021-05-24 DIAGNOSIS — Z800 Family history of malignant neoplasm of digestive organs: Secondary | ICD-10-CM

## 2021-05-24 DIAGNOSIS — S0003XA Contusion of scalp, initial encounter: Secondary | ICD-10-CM | POA: Diagnosis not present

## 2021-05-24 DIAGNOSIS — G9389 Other specified disorders of brain: Secondary | ICD-10-CM | POA: Diagnosis not present

## 2021-05-24 DIAGNOSIS — C7931 Secondary malignant neoplasm of brain: Secondary | ICD-10-CM | POA: Diagnosis not present

## 2021-05-24 DIAGNOSIS — Z8589 Personal history of malignant neoplasm of other organs and systems: Secondary | ICD-10-CM | POA: Diagnosis not present

## 2021-05-24 DIAGNOSIS — Z79899 Other long term (current) drug therapy: Secondary | ICD-10-CM

## 2021-05-24 DIAGNOSIS — E039 Hypothyroidism, unspecified: Secondary | ICD-10-CM | POA: Diagnosis present

## 2021-05-24 DIAGNOSIS — Z711 Person with feared health complaint in whom no diagnosis is made: Secondary | ICD-10-CM | POA: Diagnosis not present

## 2021-05-24 DIAGNOSIS — N184 Chronic kidney disease, stage 4 (severe): Secondary | ICD-10-CM | POA: Diagnosis present

## 2021-05-24 DIAGNOSIS — I130 Hypertensive heart and chronic kidney disease with heart failure and stage 1 through stage 4 chronic kidney disease, or unspecified chronic kidney disease: Secondary | ICD-10-CM | POA: Diagnosis present

## 2021-05-24 DIAGNOSIS — Z8543 Personal history of malignant neoplasm of ovary: Secondary | ICD-10-CM | POA: Diagnosis not present

## 2021-05-24 DIAGNOSIS — S0083XA Contusion of other part of head, initial encounter: Secondary | ICD-10-CM | POA: Diagnosis present

## 2021-05-24 DIAGNOSIS — J9611 Chronic respiratory failure with hypoxia: Secondary | ICD-10-CM | POA: Diagnosis present

## 2021-05-24 DIAGNOSIS — I10 Essential (primary) hypertension: Secondary | ICD-10-CM | POA: Diagnosis not present

## 2021-05-24 DIAGNOSIS — Z79890 Hormone replacement therapy (postmenopausal): Secondary | ICD-10-CM

## 2021-05-24 DIAGNOSIS — Z789 Other specified health status: Secondary | ICD-10-CM | POA: Diagnosis not present

## 2021-05-24 DIAGNOSIS — Z515 Encounter for palliative care: Secondary | ICD-10-CM | POA: Diagnosis not present

## 2021-05-24 LAB — CBC WITH DIFFERENTIAL/PLATELET
Abs Immature Granulocytes: 0.02 10*3/uL (ref 0.00–0.07)
Basophils Absolute: 0 10*3/uL (ref 0.0–0.1)
Basophils Relative: 0 %
Eosinophils Absolute: 0.1 10*3/uL (ref 0.0–0.5)
Eosinophils Relative: 1 %
HCT: 29.5 % — ABNORMAL LOW (ref 36.0–46.0)
Hemoglobin: 8.8 g/dL — ABNORMAL LOW (ref 12.0–15.0)
Immature Granulocytes: 0 %
Lymphocytes Relative: 10 %
Lymphs Abs: 0.6 10*3/uL — ABNORMAL LOW (ref 0.7–4.0)
MCH: 30.9 pg (ref 26.0–34.0)
MCHC: 29.8 g/dL — ABNORMAL LOW (ref 30.0–36.0)
MCV: 103.5 fL — ABNORMAL HIGH (ref 80.0–100.0)
Monocytes Absolute: 0.4 10*3/uL (ref 0.1–1.0)
Monocytes Relative: 7 %
Neutro Abs: 4.6 10*3/uL (ref 1.7–7.7)
Neutrophils Relative %: 82 %
Platelets: UNDETERMINED 10*3/uL (ref 150–400)
RBC: 2.85 MIL/uL — ABNORMAL LOW (ref 3.87–5.11)
RDW: 14 % (ref 11.5–15.5)
WBC: 5.7 10*3/uL (ref 4.0–10.5)
nRBC: 0 % (ref 0.0–0.2)

## 2021-05-24 LAB — COMPREHENSIVE METABOLIC PANEL
ALT: 19 U/L (ref 0–44)
AST: 18 U/L (ref 15–41)
Albumin: 3.4 g/dL — ABNORMAL LOW (ref 3.5–5.0)
Alkaline Phosphatase: 135 U/L — ABNORMAL HIGH (ref 38–126)
Anion gap: 9 (ref 5–15)
BUN: 58 mg/dL — ABNORMAL HIGH (ref 8–23)
CO2: 31 mmol/L (ref 22–32)
Calcium: 9 mg/dL (ref 8.9–10.3)
Chloride: 101 mmol/L (ref 98–111)
Creatinine, Ser: 2.1 mg/dL — ABNORMAL HIGH (ref 0.44–1.00)
GFR, Estimated: 24 mL/min — ABNORMAL LOW (ref >60)
Glucose, Bld: 80 mg/dL (ref 70–99)
Potassium: 4.8 mmol/L (ref 3.5–5.1)
Sodium: 141 mmol/L (ref 135–145)
Total Bilirubin: 0.8 mg/dL (ref 0.3–1.2)
Total Protein: 7.3 g/dL (ref 6.5–8.1)

## 2021-05-24 LAB — RESP PANEL BY RT-PCR (FLU A&B, COVID) ARPGX2
Influenza A by PCR: NEGATIVE
Influenza B by PCR: NEGATIVE
SARS Coronavirus 2 by RT PCR: NEGATIVE

## 2021-05-24 IMAGING — CT CT CERVICAL SPINE W/O CM
3 of 4 series · 12 of 33 positions shown, 14 images · non-contrast
Comparison: None.

CLINICAL DATA: Head trauma

EXAM:
CT CERVICAL SPINE WITHOUT CONTRAST
TECHNIQUE: Multidetector CT imaging of the cervical spine was performed without
intravenous contrast. Multiplanar CT image reconstructions were also
generated.

[Series 6: c_spine 2.0 sag bone · sagittal · 0.20mm/px · 5 of 61 slices shown, 6 images]
[im 21/61  bone]
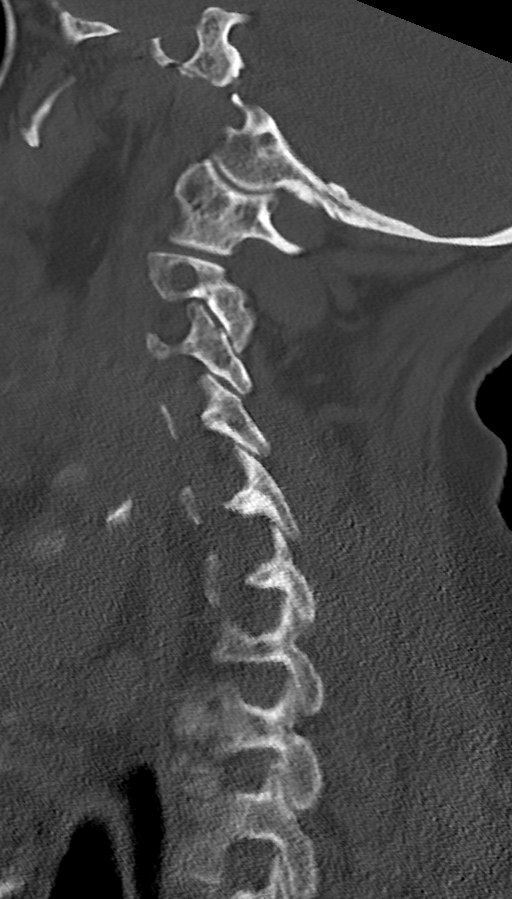
[im 26/61  bone]
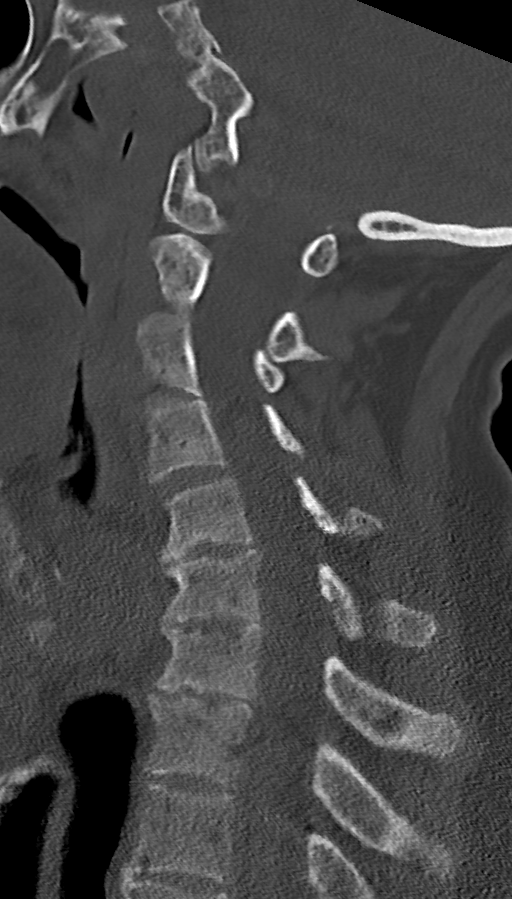
[im 31/61  soft-tissue]
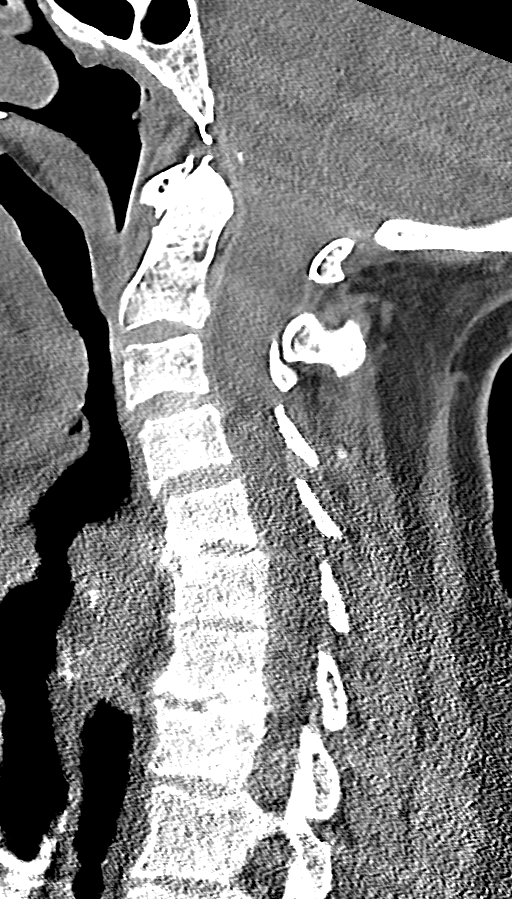
[im 31/61  bone]
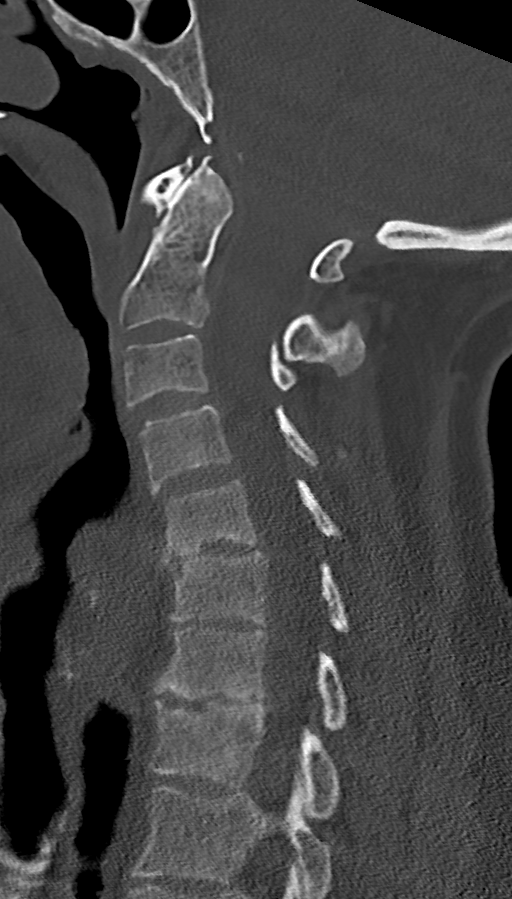
[im 36/61  bone]
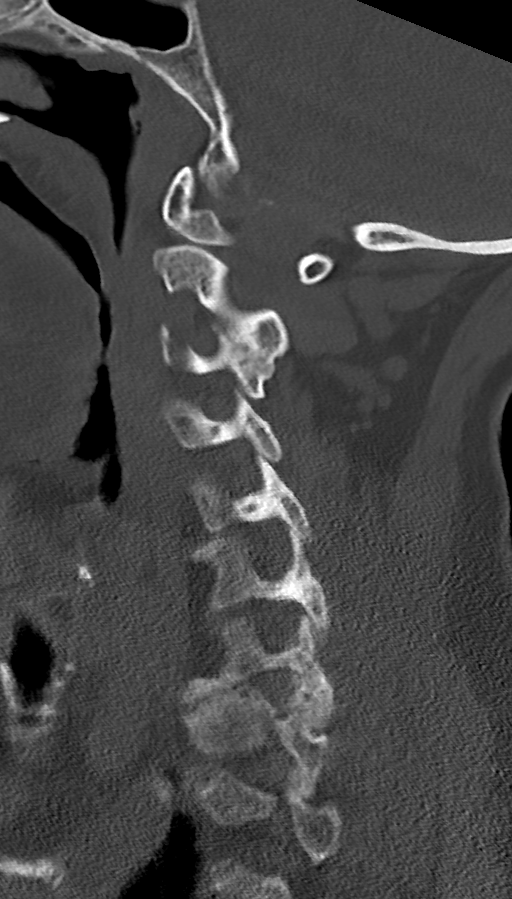
[im 41/61  bone]
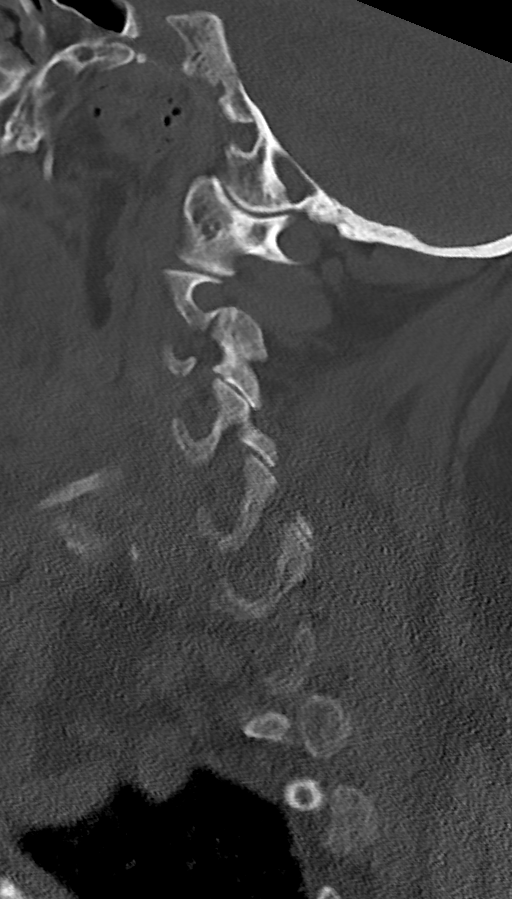

[Series 7: c_spine 2.0 cor bone · coronal · 0.23mm/px · 3 of 52 slices shown]
[im 11/52  bone]
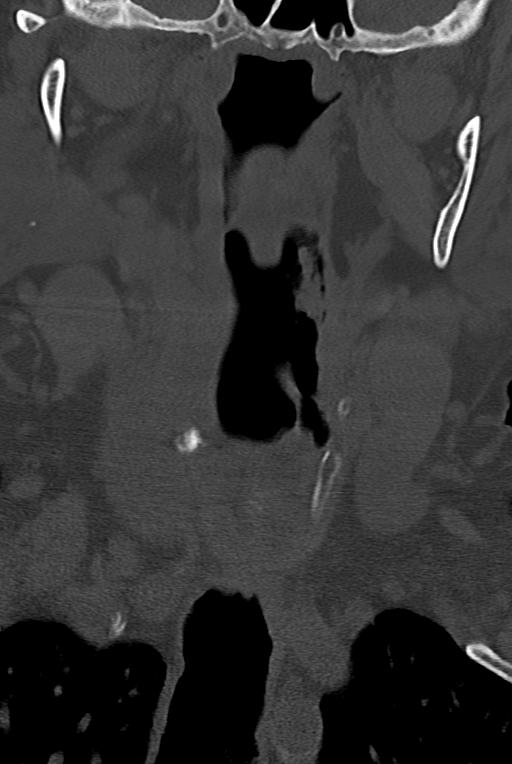
[im 21/52  bone]
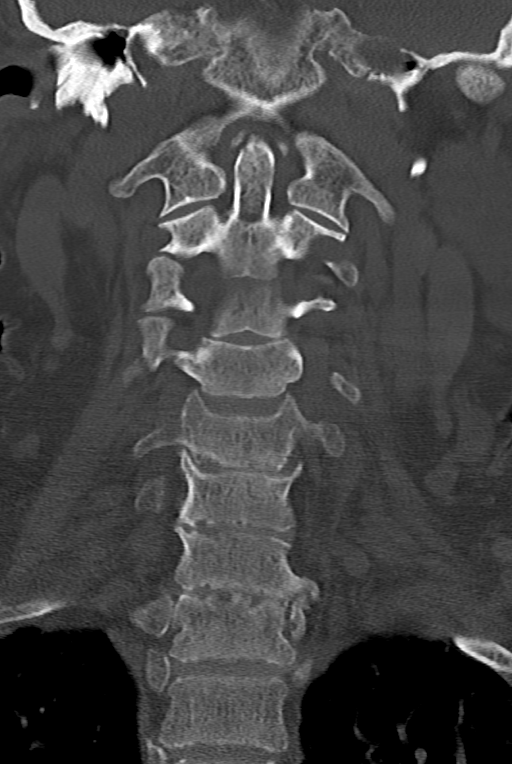
[im 31/52  bone]
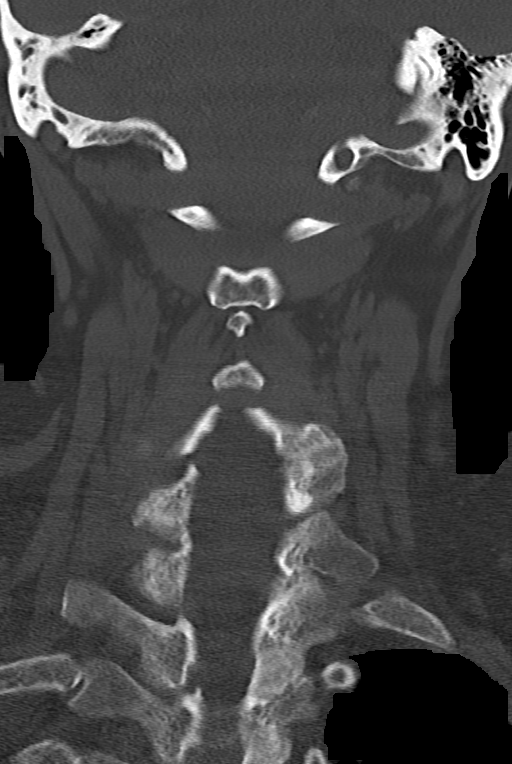

[Series 8: c_spine 2.0 orthogonals · axial · 0.21mm/px · z∈[-273,-145]mm · 4 of 101 slices shown, 5 images]
[im 15/101  soft-tissue]
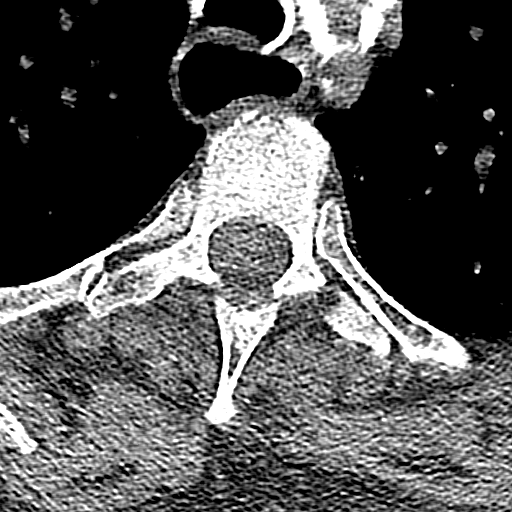
[im 15/101  bone]
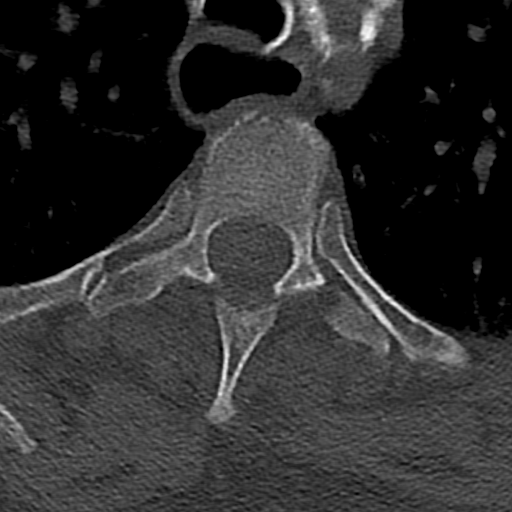
[im 43/101  bone]
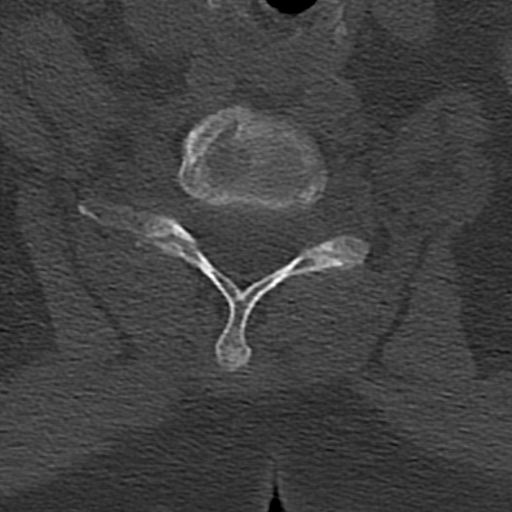
[im 58/101  bone]
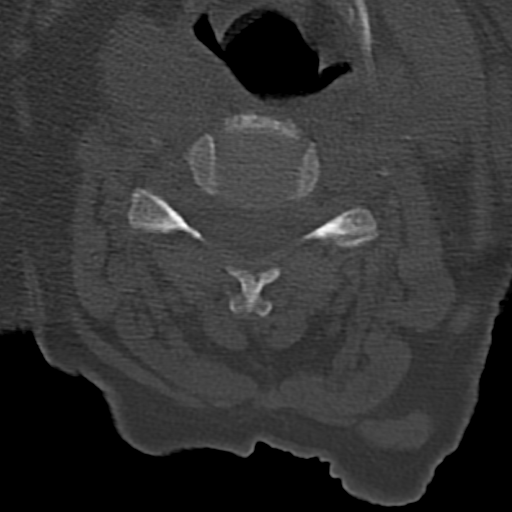
[im 86/101  bone]
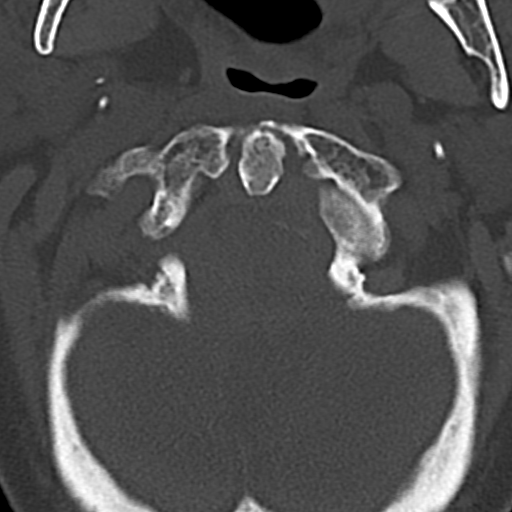

[12 of 33 positions shown; findings below may reference images not displayed]

FINDINGS: Alignment: There is no antero or retrolisthesis. There is no jumped
or perched facets or other evidence of traumatic malalignment.

Skull base and vertebrae: Skull base alignment is maintained.
Vertebral body heights are preserved. There is no evidence of acute
fracture.

Soft tissues and spinal canal: No prevertebral fluid or swelling. No
visible canal hematoma.

Disc levels: There is intervertebral disc space narrowing with mild
endplate irregularity at C5-C6 through C7-T1. The osseous spinal
canal and neural foramina are patent.

Upper chest: The lung apices are clear.

Other: None.
IMPRESSION: No acute fracture or traumatic malalignment of the cervical spine.

## 2021-05-24 IMAGING — CT CT HEAD W/O CM
3 of 4 series · 13 of 47 positions shown, 15 images · non-contrast
Comparison: None.

CLINICAL DATA: Fall, trauma

EXAM:
CT HEAD WITHOUT CONTRAST
CT MAXILLOFACIAL WITHOUT CONTRAST
TECHNIQUE: Multidetector CT imaging of the head and maxillofacial structures
were performed using the standard protocol without intravenous
contrast. Multiplanar CT image reconstructions of the maxillofacial
structures were also generated.

[Series 3: head without · axial · non-contrast · 0.45mm/px · z∈[-118,-8]mm · 7 of 30 slices shown, 9 images]
[im 4/30  brain]
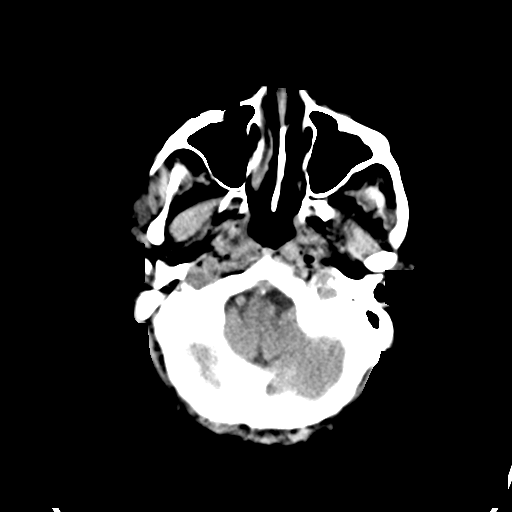
[im 4/30  bone]
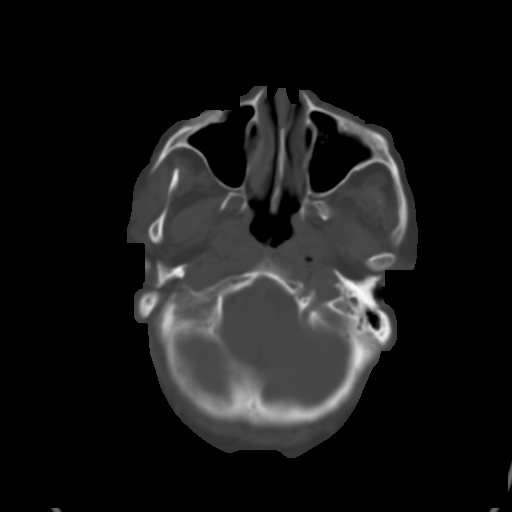
[im 8/30  brain]
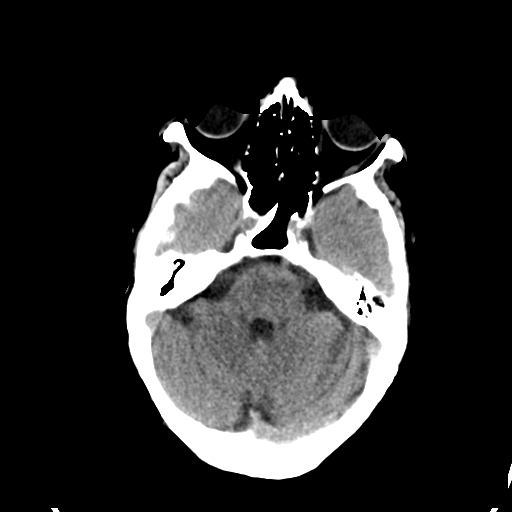
[im 11/30  brain]
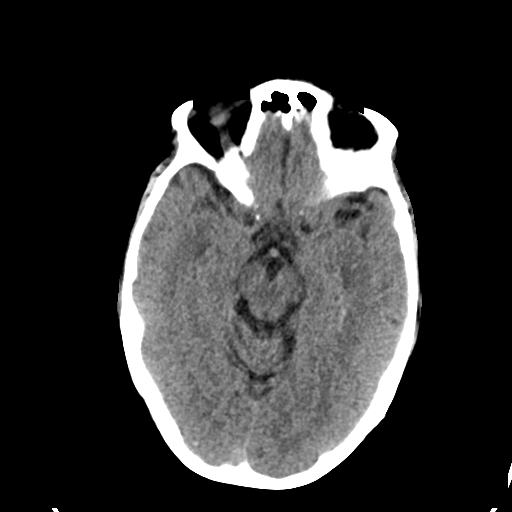
[im 15/30  brain]
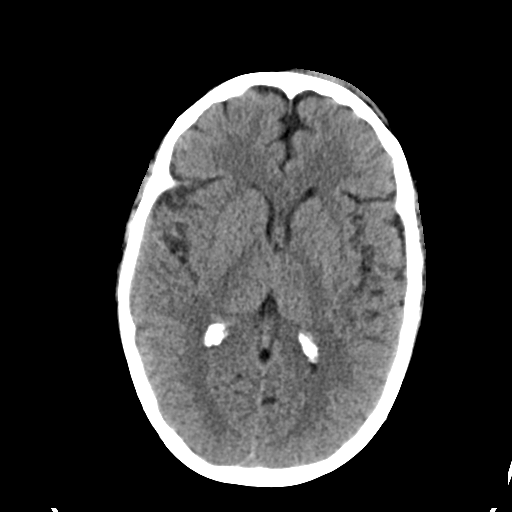
[im 19/30  brain]
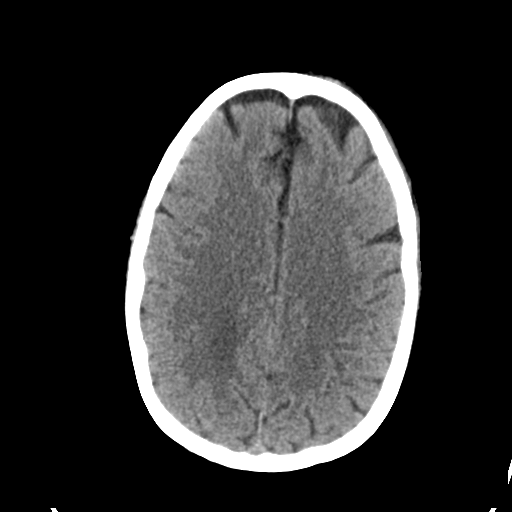
[im 19/30  bone]
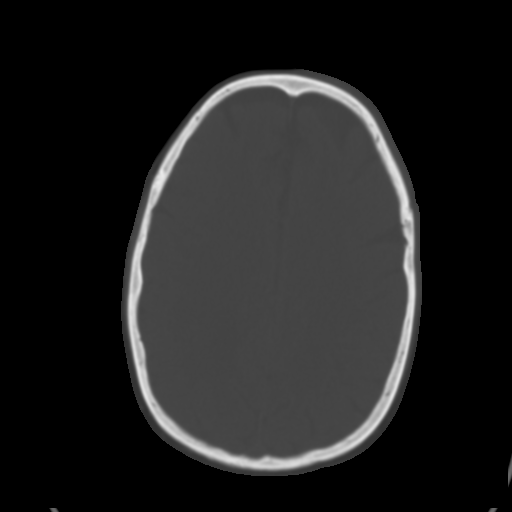
[im 22/30  brain]
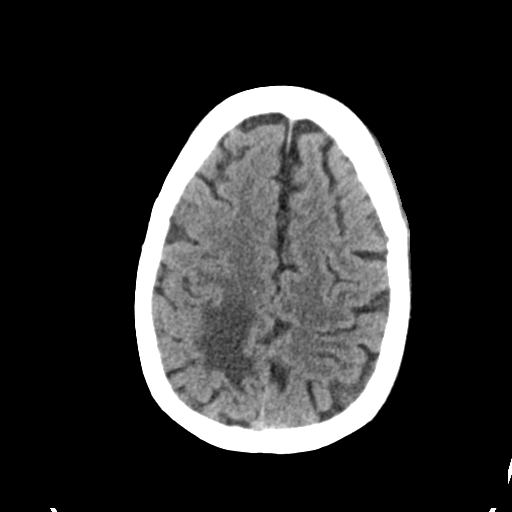
[im 26/30  brain]
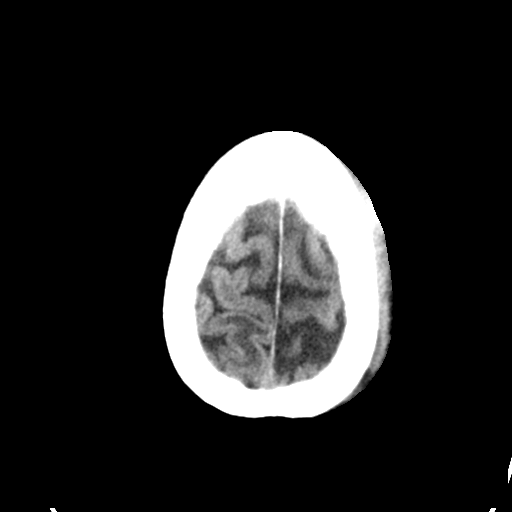

[Series 5: head without cor · coronal · non-contrast · 0.37mm/px · 3 of 76 slices shown]
[im 30/76  brain]
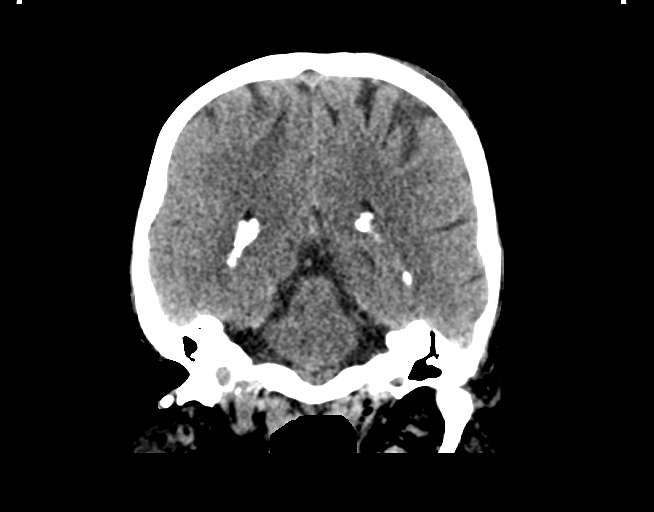
[im 35/76  brain]
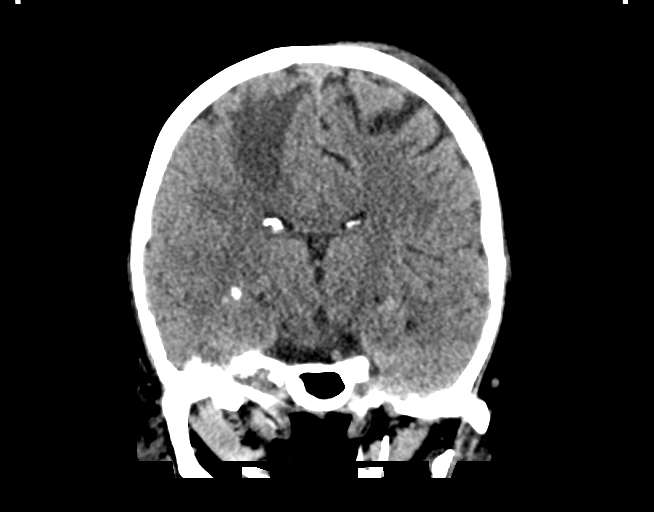
[im 41/76  brain]
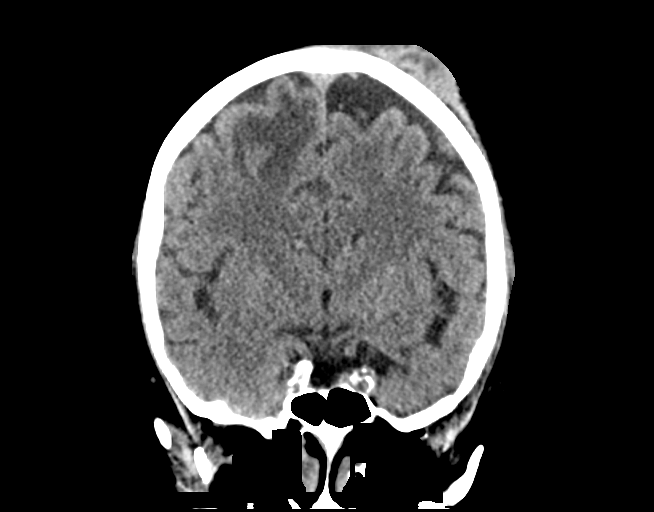

[Series 6: head without sag · sagittal · non-contrast · 0.34mm/px · 3 of 67 slices shown]
[im 23/67  brain]
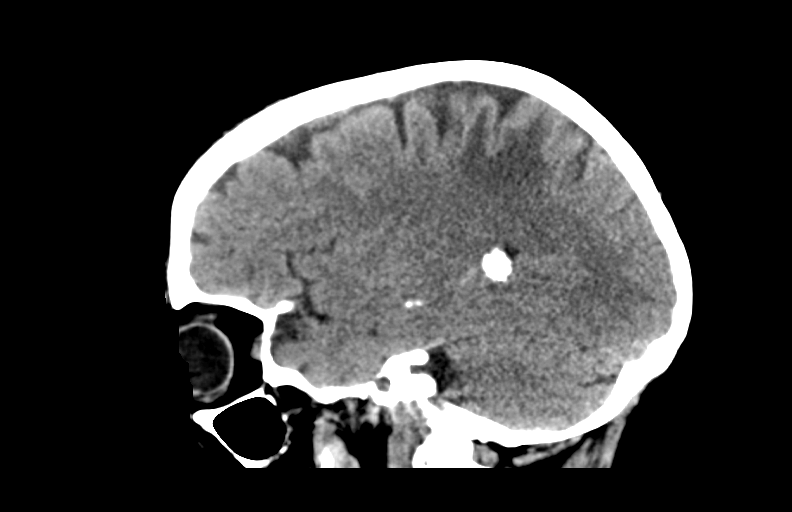
[im 34/67  brain]
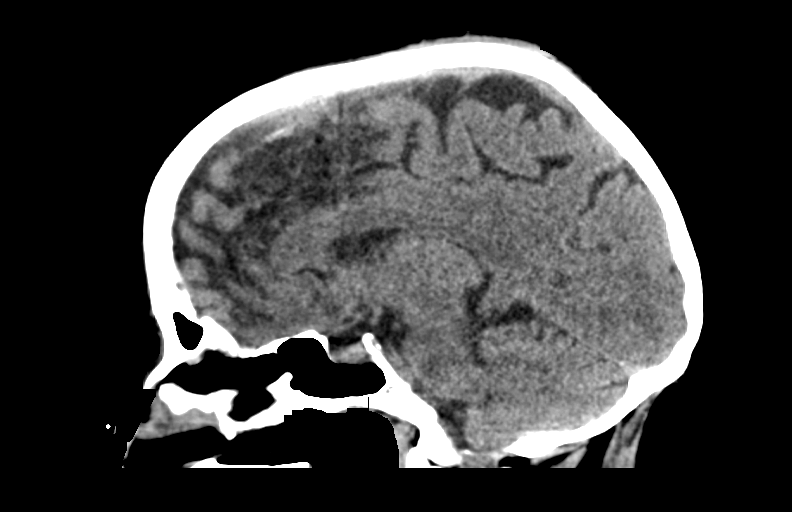
[im 45/67  brain]
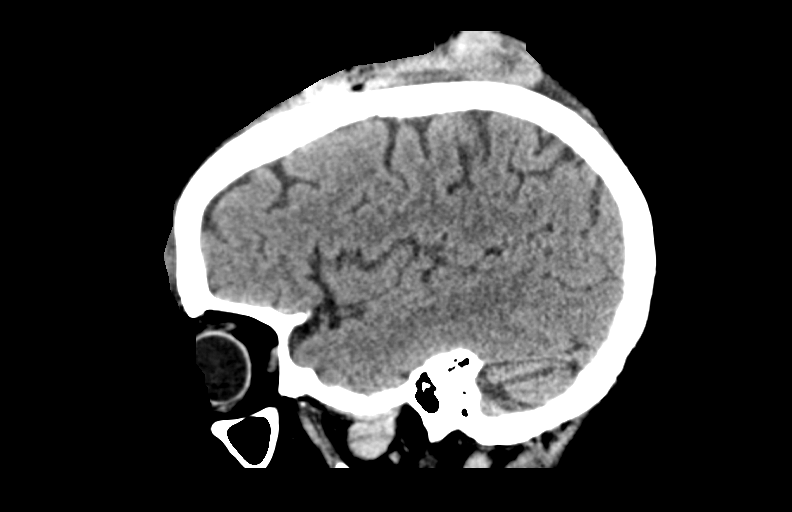

[13 of 47 positions shown; findings below may reference images not displayed]

FINDINGS: CT HEAD FINDINGS

Brain: There is a 0.8 cm focus of hyperdensity in the right parietal
lobe with significant surrounding vasogenic edema. The appearance is
not typical for a hemorrhagic contusion.

Otherwise, there is no evidence of acute intracranial hemorrhage or
extra-axial fluid collection. Parenchymal volume is normal. The
ventricles are normal in size.

There is no midline shift.

Vascular: There is calcification of the bilateral cavernous ICAs.

Skull: There is a large left frontoparietal scalp hematoma. There is
a smaller hematoma over the left forehead. There is no underlying
calvarial fracture.

Other: There is a small amount of fluid in the right mastoid air
cells. No obstructing lesion is seen in the right nasopharynx.

CT MAXILLOFACIAL FINDINGS

Osseous: No acute facial bone fracture is identified. There is no
evidence of mandibular dislocation. There is no suspicious osseous
lesion.

Orbits: Bilateral lens implants are in place. The globes and orbits
are otherwise unremarkable.

Sinuses: There is mild mucosal thickening in the left maxillary
sinus.

Soft tissues: Unremarkable.
IMPRESSION: 1. 8 mm focus of hyperdensity in the right parietal lobe with
significant surrounding edema is suspicious for a hemorrhagic
metastasis. Recommend brain MRI with and without contrast for
further evaluation.
2. Large left frontoparietal scalp hematoma and smaller hematoma in
the left forehead without underlying calvarial fracture.
3. No facial bone fracture.

## 2021-05-24 IMAGING — MR MR HEAD W/O CM
7 of 11 series · 26 of 48 positions shown · IV contrast (agent unspecified)
Comparison: Head CT same day.

CLINICAL DATA: Fall with trauma to the head. Right parietal
hemorrhage with surrounding edema of felt suspicious for a
metastasis by CT. The patient would not allow postcontrast imaging.

EXAM:
MRI HEAD WITHOUT CONTRAST
TECHNIQUE: Multiplanar, multiecho pulse sequences of the brain and surrounding
structures were obtained without intravenous contrast.

[Series 2: DWI · axial · 3.0mm · 0.94mm/px · z∈[-124,+35]mm · 7 of 110 slices shown (1 of 2)]
[im 1/110]
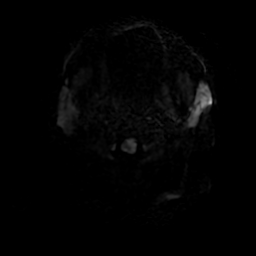
[im 19/110]
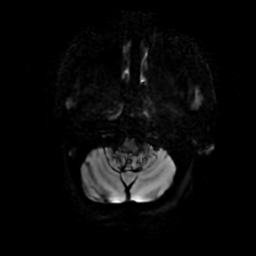
[im 37/110]
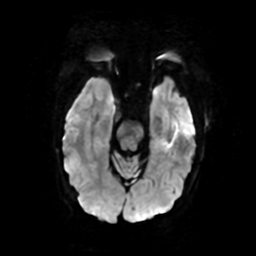
[im 55/110]
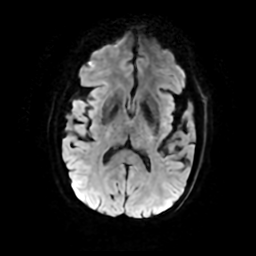
[im 73/110]
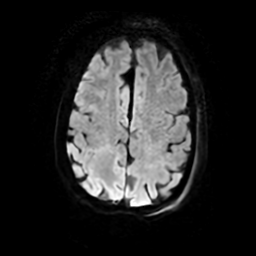
[im 91/110]
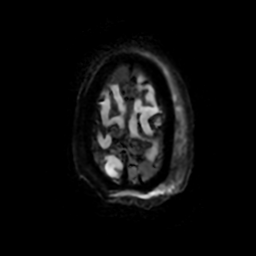
[im 110/110]
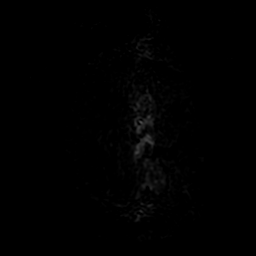

[Series 3: DWI · coronal · 4.0mm · 0.94mm/px · 6 of 78 slices shown (2 of 2)]
[im 1/78]
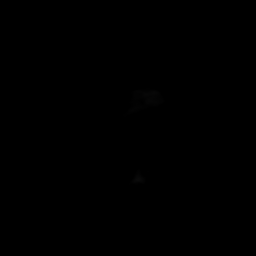
[im 16/78]
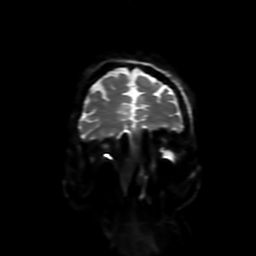
[im 31/78]
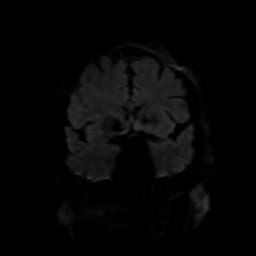
[im 47/78]
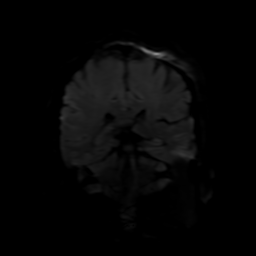
[im 62/78]
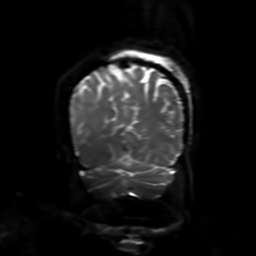
[im 78/78]
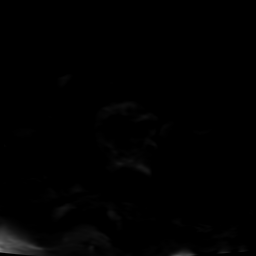

[Series 4: FLAIR · sagittal · 5.0mm · 0.23mm/px · 2 of 26 slices shown (1 of 3)]
[im 1/26]
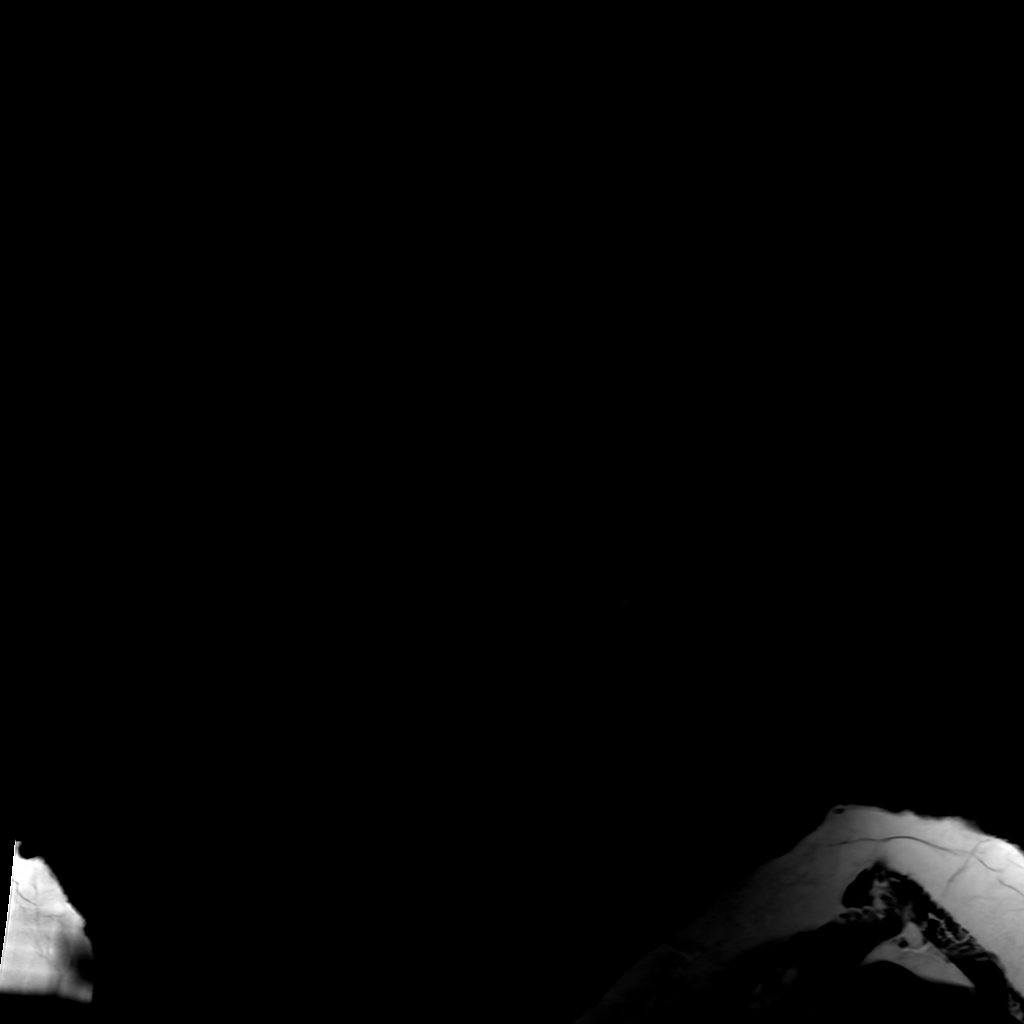
[im 26/26]
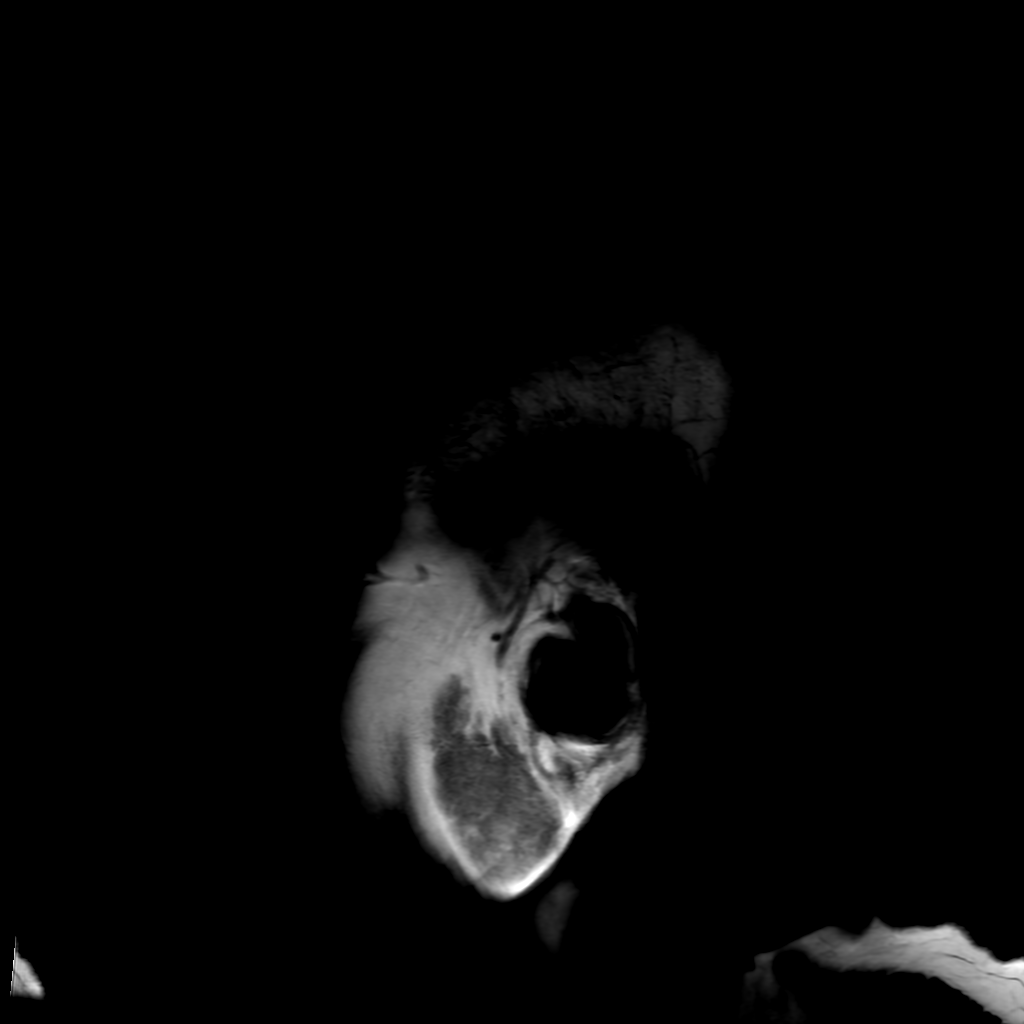

[Series 6: FLAIR · axial · 4.0mm · 0.45mm/px · 1 of 16 slices shown (2 of 3)]
[im 1/16]
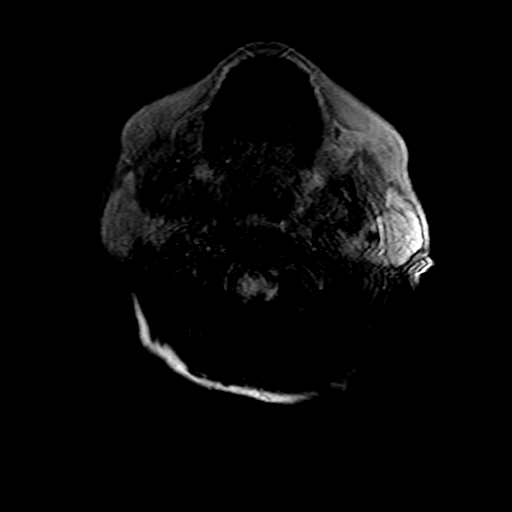

[Series 7: FLAIR · axial · 4.0mm · 0.45mm/px · z∈[-113,+48]mm · 3 of 38 slices shown (3 of 3)]
[im 1/38]
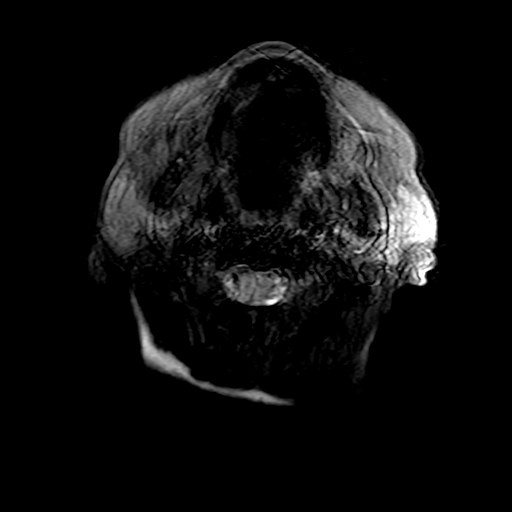
[im 19/38]
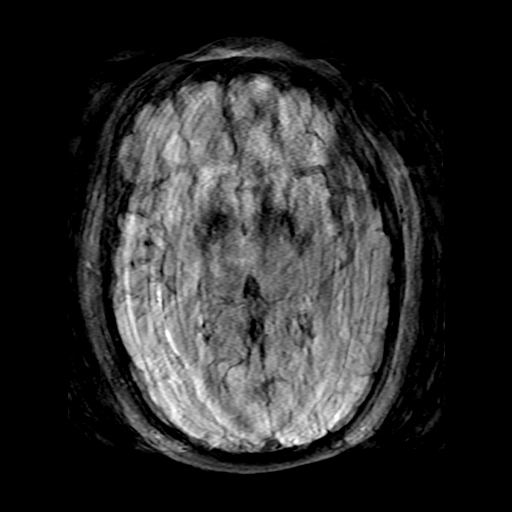
[im 38/38]
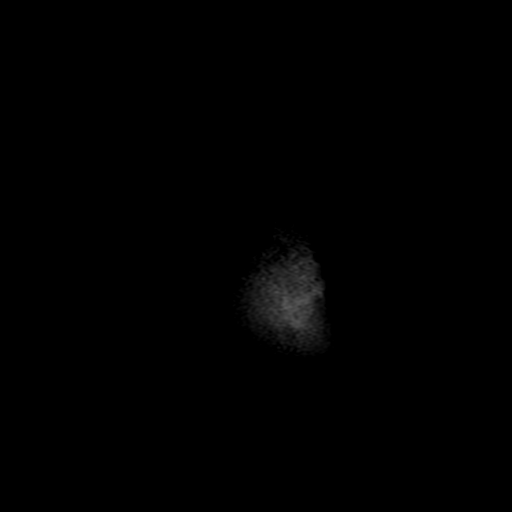

[Series 250: ADC · axial · 3.0mm · 0.94mm/px · z∈[-124,+35]mm · 4 of 55 slices shown (1 of 2)]
[im 1/55]
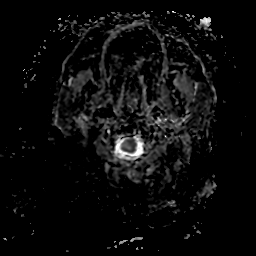
[im 19/55]
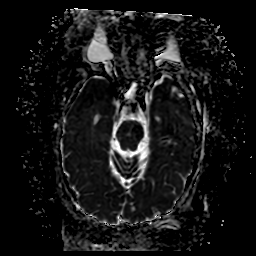
[im 37/55]
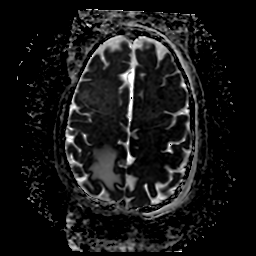
[im 55/55]
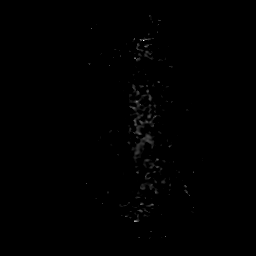

[Series 350: ADC · coronal · 4.0mm · 0.94mm/px · 3 of 38 slices shown (2 of 2)]
[im 1/38]
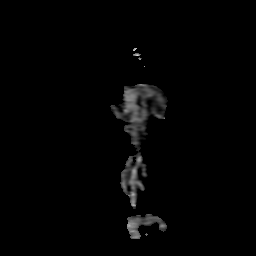
[im 19/38]
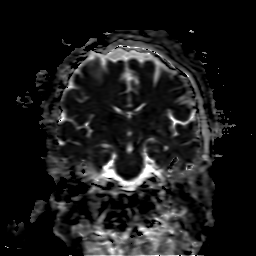
[im 38/38]
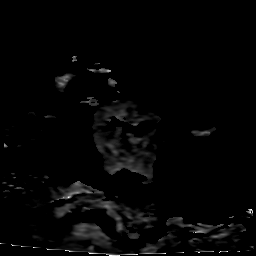

[26 of 48 positions shown; findings below may reference images not displayed]

FINDINGS: Brain: Diffusion imaging does not show any acute or subacute
infarction. No focal abnormality affects the brainstem, cerebellum
or left cerebral hemisphere. At the right parietal vertex, there is
a 9-10 mm lesion with internal hemorrhage and surrounding vasogenic
edema. No second lesion is identified on this noncontrast exam. The
patient does also have a few scattered punctate foci of hemosiderin
deposition elsewhere including in the cerebellum, and in a few foci
of the cerebral hemispheric white matter on both sides. Most likely
diagnosis of the main lesion is hemorrhagic metastasis. Primary
brain tumor is less likely. Inflammatory lesions such as
neurocysticercosis could be considered. Postcontrast imaging
suggested when the patient is able.

No hydrocephalus.  No extra-axial collection.

Vascular: Major vessels at the base of the brain show flow.

Skull and upper cervical spine: Negative

Sinuses/Orbits: Clear/normal

Other: Left-sided scalp hematomas.
IMPRESSION: 9-10 mm hemorrhagic lesion at the right parietal vertex with
surrounding vasogenic edema. Most likely diagnosis would be
hemorrhagic metastatic lesion. Primary brain tumor is less likely.
Inflammatory focus such as neurocysticercosis could be considered.
The patient would not tolerate postcontrast imaging. That would be
suggested when the patient is able.

There are a few other scattered punctate foci of susceptibility
artifact/hemosiderin deposition scattered elsewhere throughout the
brain including within the cerebellum and both cerebral hemispheres.
These could be better evaluated with repeat imaging as well. These
do not appear to be associated with any discernible edema. The could
be chronic microhemorrhages related to hypertension, could relate to
the current or previous trauma or could conceivably represent
additional hemorrhagic tiny lesions.

## 2021-05-24 IMAGING — CT CT MAXILLOFACIAL W/O CM
3 series · 15 of 47 positions shown, 18 images · non-contrast
Comparison: None.

CLINICAL DATA: Fall, trauma

EXAM:
CT HEAD WITHOUT CONTRAST
CT MAXILLOFACIAL WITHOUT CONTRAST
TECHNIQUE: Multidetector CT imaging of the head and maxillofacial structures
were performed using the standard protocol without intravenous
contrast. Multiplanar CT image reconstructions of the maxillofacial
structures were also generated.

[Series 3: facialbone 2.0 st · axial · 0.29mm/px · z∈[-194,-68]mm · 9 of 75 slices shown, 12 images]
[im 6/75  brain]
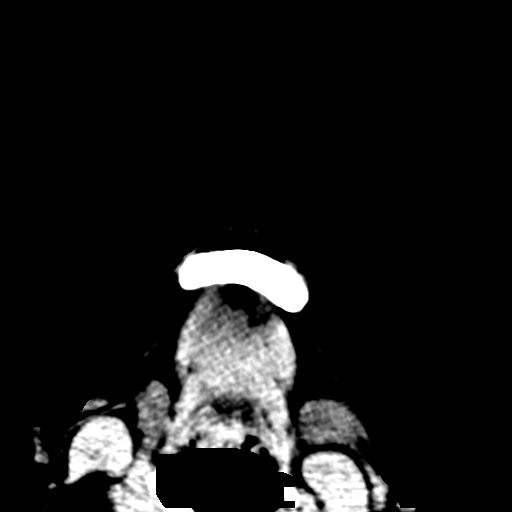
[im 6/75  bone]
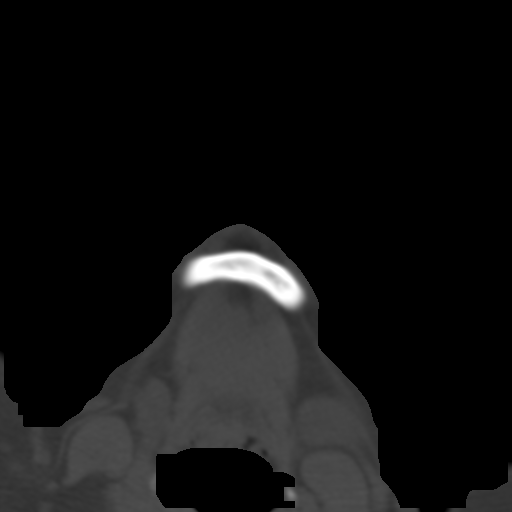
[im 13/75  bone]
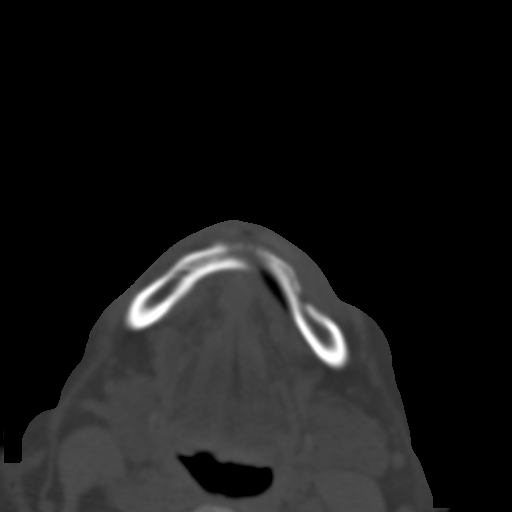
[im 21/75  bone]
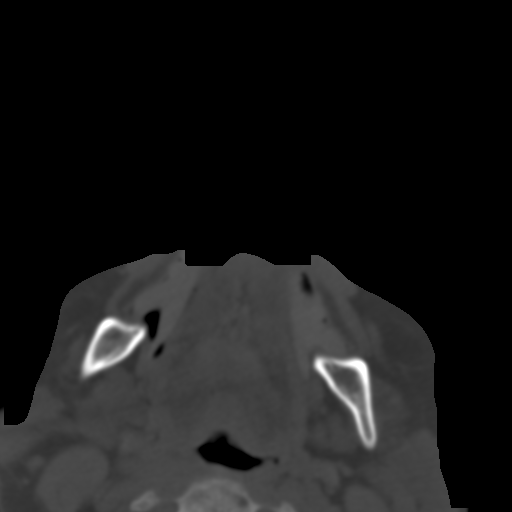
[im 29/75  bone]
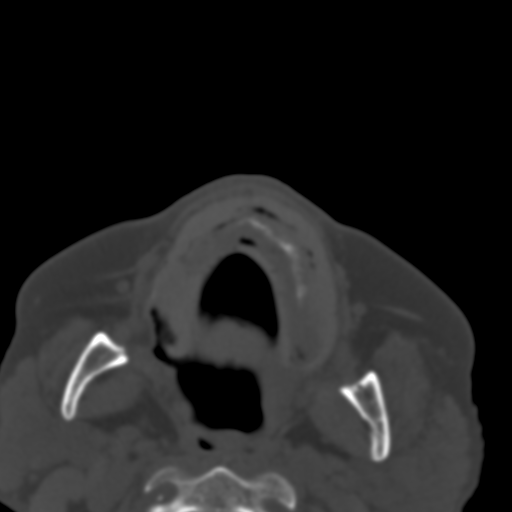
[im 39/75  brain]
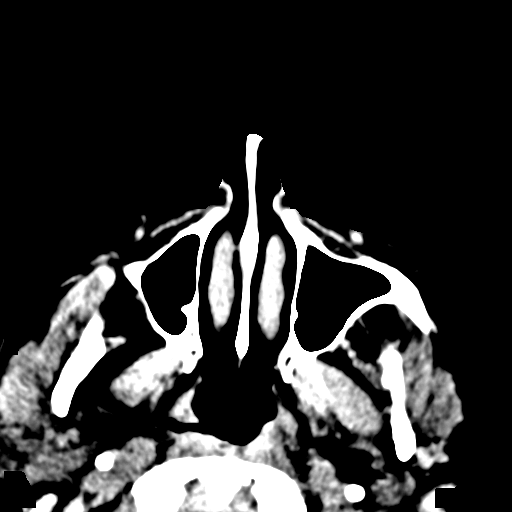
[im 39/75  bone]
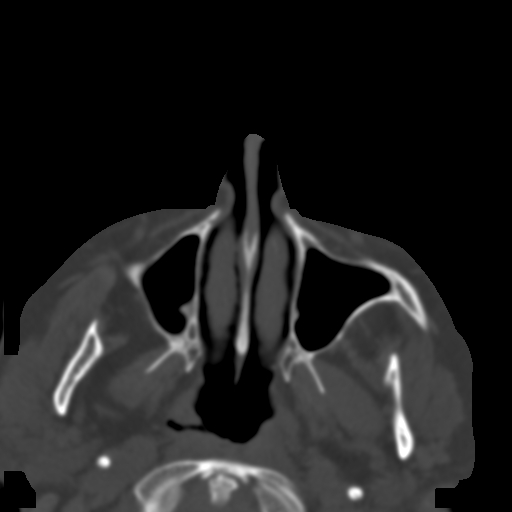
[im 46/75  bone]
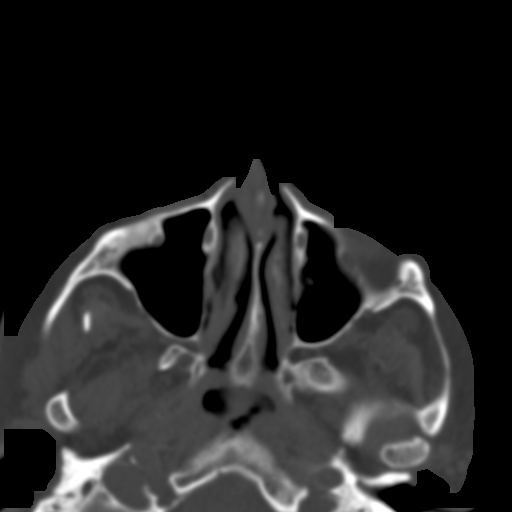
[im 54/75  bone]
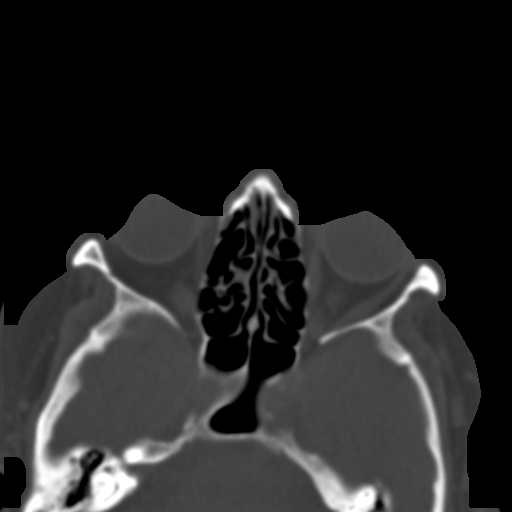
[im 62/75  bone]
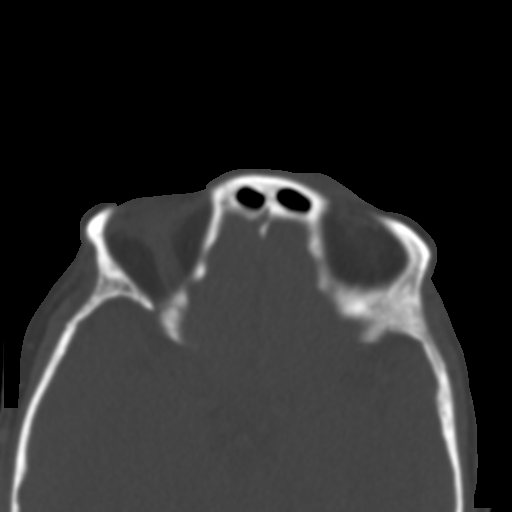
[im 69/75  brain]
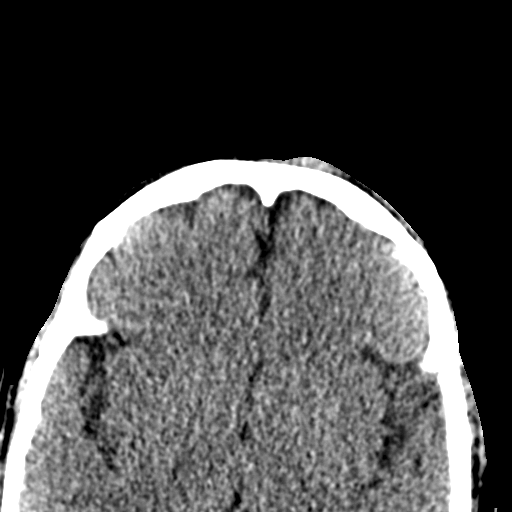
[im 69/75  bone]
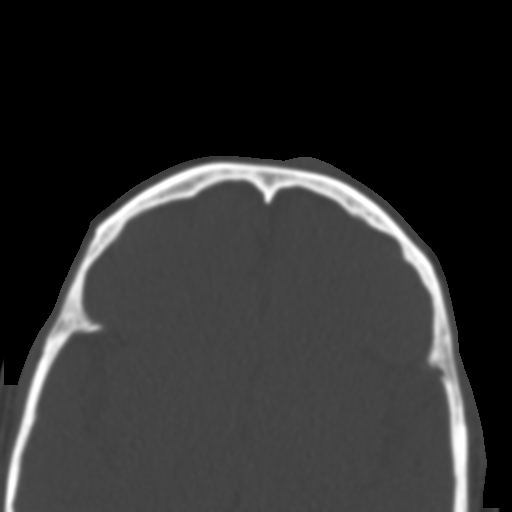

[Series 7: facialbone 2.0 sag st · sagittal · 0.26mm/px · 3 of 75 slices shown]
[im 25/75  bone]
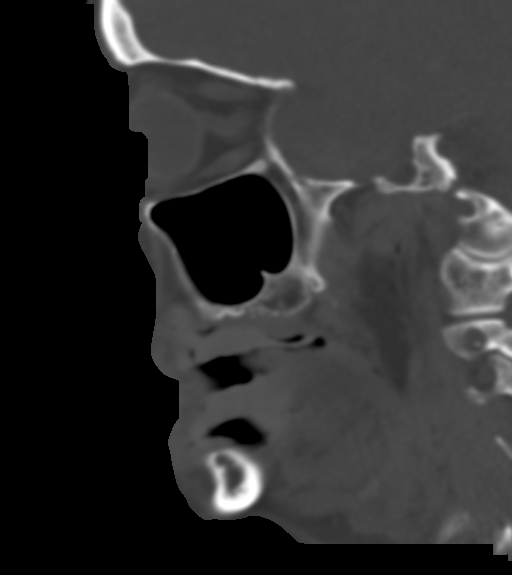
[im 38/75  bone]
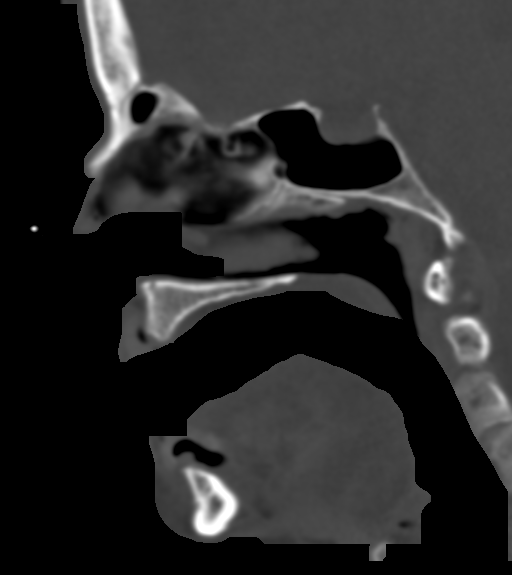
[im 50/75  bone]
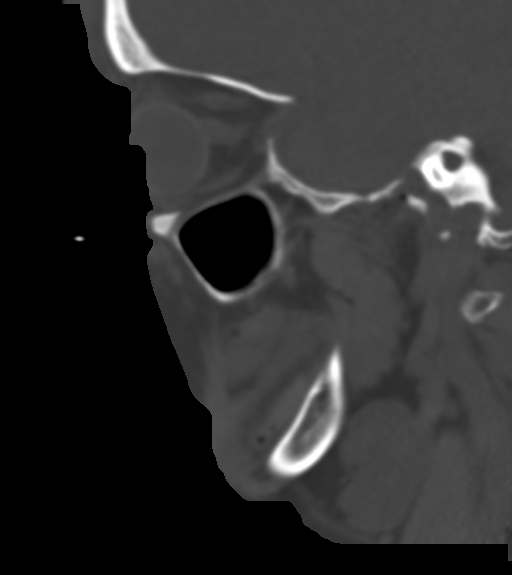

[Series 8: facialbone 2.0 cor st · coronal · 0.29mm/px · 3 of 66 slices shown]
[im 22/66  bone]
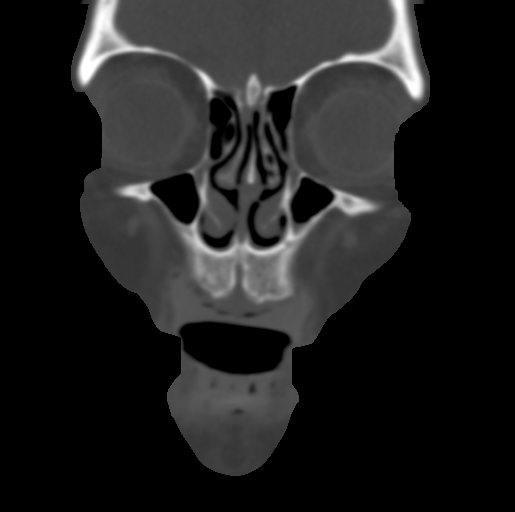
[im 29/66  bone]
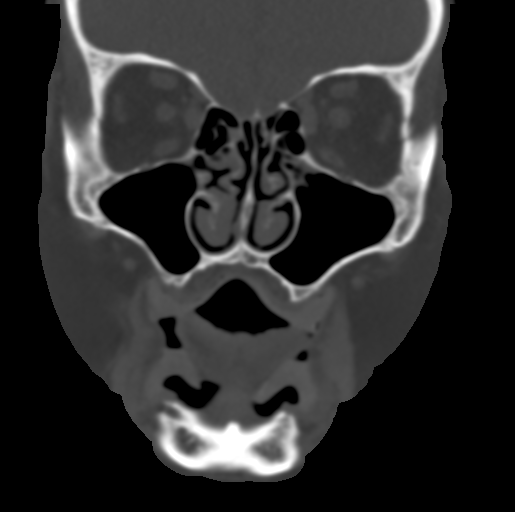
[im 37/66  bone]
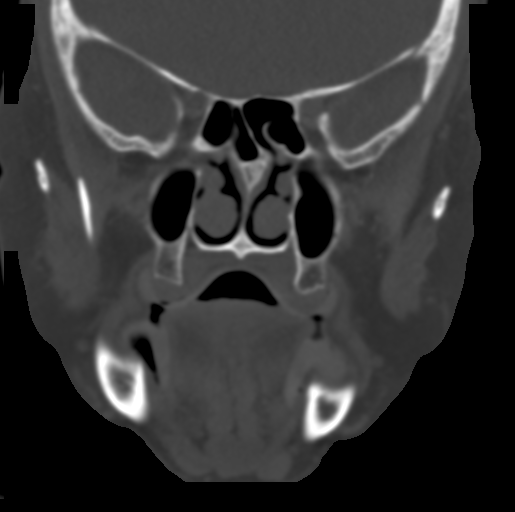

[15 of 47 positions shown; findings below may reference images not displayed]

FINDINGS: CT HEAD FINDINGS

Brain: There is a 0.8 cm focus of hyperdensity in the right parietal
lobe with significant surrounding vasogenic edema. The appearance is
not typical for a hemorrhagic contusion.

Otherwise, there is no evidence of acute intracranial hemorrhage or
extra-axial fluid collection. Parenchymal volume is normal. The
ventricles are normal in size.

There is no midline shift.

Vascular: There is calcification of the bilateral cavernous ICAs.

Skull: There is a large left frontoparietal scalp hematoma. There is
a smaller hematoma over the left forehead. There is no underlying
calvarial fracture.

Other: There is a small amount of fluid in the right mastoid air
cells. No obstructing lesion is seen in the right nasopharynx.

CT MAXILLOFACIAL FINDINGS

Osseous: No acute facial bone fracture is identified. There is no
evidence of mandibular dislocation. There is no suspicious osseous
lesion.

Orbits: Bilateral lens implants are in place. The globes and orbits
are otherwise unremarkable.

Sinuses: There is mild mucosal thickening in the left maxillary
sinus.

Soft tissues: Unremarkable.
IMPRESSION: 1. 8 mm focus of hyperdensity in the right parietal lobe with
significant surrounding edema is suspicious for a hemorrhagic
metastasis. Recommend brain MRI with and without contrast for
further evaluation.
2. Large left frontoparietal scalp hematoma and smaller hematoma in
the left forehead without underlying calvarial fracture.
3. No facial bone fracture.

## 2021-05-24 MED ORDER — LORAZEPAM 2 MG/ML IJ SOLN
1.0000 mg | Freq: Once | INTRAMUSCULAR | Status: AC
  Administered 2021-05-24: 1 mg via INTRAVENOUS
  Filled 2021-05-24: qty 1

## 2021-05-24 MED ORDER — ACETAMINOPHEN 500 MG PO TABS: 1000.0000 mg | ORAL_TABLET | Freq: Once | ORAL | Status: DC

## 2021-05-24 NOTE — ED Notes (Signed)
Pt had a bowel movement. Pt cleaned, linens changed, purewick applied.

## 2021-05-24 NOTE — ED Notes (Signed)
Patient transported to CT 
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
## 2021-05-24 NOTE — ED Notes (Signed)
Patient requesting something "to eat and drink."  Patient waiting for admission.  No orders at this time

## 2021-05-24 NOTE — ED Triage Notes (Incomplete)
Pt BIB [facility] from home as a level 2 fall. Pt was coming out of bathroom, ended up falling on the floor.  No loc. No blood thinner. Hematoma noted on pt's forehead.  A&O X4.   Pupils equal bilateral.  Equal lung sounds.  No tenderness on neck.

## 2021-05-24 NOTE — Consult Note (Signed)
Neurosurgery Consultation  Reason for Consult: Suspected metastatic brain tumor Referring Physician: Eulis Foster  CC: Fall  HPI: This is a 76 y.o. woman that presents to the ED after a fall. Upon further questioning, she had uncontrolled rhythmic movement of her LLE that caused the fall. She reports one other episode like this one week ago, both lasted roughly 5 minutes and were self-terminated. She fell this time and hit her head with +LOC. PMHx is extensive and listed below. She endorses headache, no h/o neoplasm, no recent weight loss or B Sx. She denies any new weakness, numbness, or parasthesias, no h/o GTCs / 2ndary generalization. No recent use of anti-platelet or anti-coagulant medications.   ROS: A 14 point ROS was performed and is negative except as noted in the HPI.   PMHx:  Past Medical History:  Diagnosis Date   Cataract    bilateral   Chronic diarrhea    pt reports r/t Crohn's   Chronic diastolic CHF (congestive heart failure) (HCC)    Chronic edema    bilateral lower extremity edema   Chronic respiratory failure (HCC)    3L   COPD (chronic obstructive pulmonary disease) (HCC)    Esophageal stricture    GERD (gastroesophageal reflux disease) 09/10/10   Hiatal hernia 09/10/10   1-2 cm   Hyperlipidemia    borderline   Hypokalemia    Hypothyroidism    Hypoxia 11/2019   Morbid obesity ([place])    Oxygen deficiency    has 02 at home to use as needed. no use in the last several months 2.5L as needed    Paroxysmal SVT (supraventricular tachycardia) (HCC)    Stricture and stenosis of esophagus 09/10/2010   Ulcerative colitis, universal ([place]) 09/10/2010   endoscopic changes in rectum and sigmoid, microscopic elsewhere   FamHx:  Family History  Problem Relation Age of Onset   Heart disease Father    Peripheral vascular disease Father    Esophageal cancer Mother    Colon cancer Neg Hx    SocHx:  reports that she quit smoking about 17 years ago. Her smoking use included  cigarettes. She has never used smokeless tobacco. She reports that she does not drink alcohol and does not use drugs.  Exam: Vital signs in last 24 hours: Temp:  [98.1 F (36.7 C)] 98.1 F (36.7 C) (12/01 1308) Pulse Rate:  [63-77] 75 (12/01 1730) Resp:  [12-20] 12 (12/01 1730) BP: (105-115)/(36-50) 115/50 (12/01 1530) SpO2:  [89 %-98 %] 94 % (12/01 1730) Weight:  [117.9 kg] 117.9 kg (12/01 1257) General: Awake, alert, cooperative, lying in bed in NAD Head: Normocephalic, +forehead contusion / cephalohematoma HEENT: Neck supple Pulmonary: breathing supplemental O2 comfortably, no evidence of increased work of breathing Cardiac: RRR Abdomen: obese S NT ND Extremities: Warm and well perfused x4 Neuro: AOx3, PERRL, EOMI, FS Strength 5/5 x4, SILTx4, no pronator drift, no neglect   Assessment and Plan: 75 y.o. woman s/p fall due to likely focal LLE seizure. MRI personally reviewed, which shows likely small hemorrhagic met in the right postcentral gyrus with surrounding vasogenic edema and small component of hemorrhage.  -recommend metastatic workup to search for primary, I do agree that this looks like a metastatic lesion. If no primary can be found, will need a craniotomy for resection to get a diagnosis / guide treatment and help with seizure control. If primary can be found and pathology isn't needed from the brain met, likely can be treated with SRS depending on suspected pathology.  -  discussed the above w/ the patient -please call with any concerns or questions  Judith Part, MD 05/24/21 5:43 PM [facility]
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
## 2021-05-24 NOTE — ED Notes (Signed)
Patient transported to MRI 

## 2021-05-24 NOTE — ED Notes (Signed)
Patient requested "something to drink."  Stated "I have been here for 12 hours and I haven't had anything. You can't keep a person here without giving them anything."  Patient informed that RN could give ice chips until an order was placed for diet.  RN returned to room and patient was asleep.

## 2021-05-24 NOTE — ED Provider Notes (Addendum)
4:30 PM-checkout from Dr. Dina Rich to evaluate patient after MRI imaging and talk to neurosurgery regarding new lesion in right brain with edema.  I talked to neurosurgeon, Dr. Venetia Constable who did not recommend steroids at this time.  He agrees with hospitalization for evaluation and septic work-up.  We will see the patient as a Optometrist.   MDM-patient with fall felt to be due to weakness while in her bathroom today.  She is chronically ill, receiving infusions for ulcerative pancolitis, has a history of congestive heart disease, has history of renal insufficiency, and has not seen her PCP since July 2022.  She states she has a history of ovarian cancer treated with surgery but has not followed up about it since the ovaries were removed , 2 years ago.  Patient states she fell twice today.  She has been otherwise relatively well, without anorexia, nausea or vomiting.  She states she has lost about 5 pounds recently.  Apparently plans were made to treat her with chemotherapy at [facility] however the patient preferred to transfer that to [facility].  The patient was seen by Dr. Alvy Bimler, [facility] health oncology.  Last visit 07/03/2020.  At that time the patient was enrolled with hospice and not receiving either therapeutic or palliative chemotherapy.  6:20 PM-I reached out to the patient's caregiver, Virgel Paling.  She is currently with the patient 3 times a week.  She states that she saw the patient today after she activated her life line.  Apparently the patient was feeling weak if she was going to the bathroom her legs began shaking, more prominent on the left than the right.  She also noticed some numbness in the legs, again left greater than right.  Patient told Rod Holler that this is what made her fall.  Recently the patient has had decreased oral fluid intake but is eating okay.  The patient was previously on hospice but is not currently.  [place] is not sure who her healthcare power of attorney is.  The patient  has a daughter however Karle Starch states she is "not reliable."   Will contact on-call for unassigned hospitalist to admit patient.  Hospitalist will admit the patient, 7:20 PM   Daleen Bo, MD 05/24/21 1921

## 2021-05-24 NOTE — ED Notes (Signed)
MD Eulis Foster made aware of pt's low diastolic pressure.

## 2021-05-25 ENCOUNTER — Other Ambulatory Visit: Payer: Self-pay

## 2021-05-25 DIAGNOSIS — E039 Hypothyroidism, unspecified: Secondary | ICD-10-CM

## 2021-05-25 DIAGNOSIS — Z515 Encounter for palliative care: Secondary | ICD-10-CM

## 2021-05-25 DIAGNOSIS — C7931 Secondary malignant neoplasm of brain: Principal | ICD-10-CM

## 2021-05-25 DIAGNOSIS — C5701 Malignant neoplasm of right fallopian tube: Secondary | ICD-10-CM

## 2021-05-25 DIAGNOSIS — E441 Mild protein-calorie malnutrition: Secondary | ICD-10-CM

## 2021-05-25 DIAGNOSIS — R638 Other symptoms and signs concerning food and fluid intake: Secondary | ICD-10-CM

## 2021-05-25 DIAGNOSIS — Z7189 Other specified counseling: Secondary | ICD-10-CM

## 2021-05-25 DIAGNOSIS — G939 Disorder of brain, unspecified: Secondary | ICD-10-CM

## 2021-05-25 DIAGNOSIS — Z789 Other specified health status: Secondary | ICD-10-CM

## 2021-05-25 DIAGNOSIS — W19XXXA Unspecified fall, initial encounter: Secondary | ICD-10-CM

## 2021-05-25 LAB — CBC WITH DIFFERENTIAL/PLATELET
Abs Immature Granulocytes: 0.01 10*3/uL (ref 0.00–0.07)
Basophils Absolute: 0 10*3/uL (ref 0.0–0.1)
Basophils Relative: 1 %
Eosinophils Absolute: 0.1 10*3/uL (ref 0.0–0.5)
Eosinophils Relative: 3 %
HCT: 28.6 % — ABNORMAL LOW (ref 36.0–46.0)
Hemoglobin: 8.5 g/dL — ABNORMAL LOW (ref 12.0–15.0)
Immature Granulocytes: 0 %
Lymphocytes Relative: 21 %
Lymphs Abs: 0.9 10*3/uL (ref 0.7–4.0)
MCH: 30.8 pg (ref 26.0–34.0)
MCHC: 29.7 g/dL — ABNORMAL LOW (ref 30.0–36.0)
MCV: 103.6 fL — ABNORMAL HIGH (ref 80.0–100.0)
Monocytes Absolute: 0.4 10*3/uL (ref 0.1–1.0)
Monocytes Relative: 9 %
Neutro Abs: 2.6 10*3/uL (ref 1.7–7.7)
Neutrophils Relative %: 66 %
Platelets: 100 10*3/uL — ABNORMAL LOW (ref 150–400)
RBC: 2.76 MIL/uL — ABNORMAL LOW (ref 3.87–5.11)
RDW: 13.9 % (ref 11.5–15.5)
WBC: 4 10*3/uL (ref 4.0–10.5)
nRBC: 0 % (ref 0.0–0.2)

## 2021-05-25 LAB — COMPREHENSIVE METABOLIC PANEL
ALT: 17 U/L (ref 0–44)
AST: 17 U/L (ref 15–41)
Albumin: 3 g/dL — ABNORMAL LOW (ref 3.5–5.0)
Alkaline Phosphatase: 130 U/L — ABNORMAL HIGH (ref 38–126)
Anion gap: 5 (ref 5–15)
BUN: 57 mg/dL — ABNORMAL HIGH (ref 8–23)
CO2: 32 mmol/L (ref 22–32)
Calcium: 8.8 mg/dL — ABNORMAL LOW (ref 8.9–10.3)
Chloride: 102 mmol/L (ref 98–111)
Creatinine, Ser: 2.14 mg/dL — ABNORMAL HIGH (ref 0.44–1.00)
GFR, Estimated: 23 mL/min — ABNORMAL LOW (ref >60)
Glucose, Bld: 117 mg/dL — ABNORMAL HIGH (ref 70–99)
Potassium: 4.5 mmol/L (ref 3.5–5.1)
Sodium: 139 mmol/L (ref 135–145)
Total Bilirubin: 0.5 mg/dL (ref 0.3–1.2)
Total Protein: 6.7 g/dL (ref 6.5–8.1)

## 2021-05-25 LAB — URINALYSIS, ROUTINE W REFLEX MICROSCOPIC
Bilirubin Urine: NEGATIVE
Glucose, UA: NEGATIVE mg/dL
Ketones, ur: NEGATIVE mg/dL
Nitrite: POSITIVE — AB
Protein, ur: NEGATIVE mg/dL
Specific Gravity, Urine: 1.01 (ref 1.005–1.030)
pH: 6 (ref 5.0–8.0)

## 2021-05-25 LAB — URINALYSIS, MICROSCOPIC (REFLEX): WBC, UA: >50 WBC/hpf (ref 0–5)

## 2021-05-25 LAB — MAGNESIUM: Magnesium: 1.9 mg/dL (ref 1.7–2.4)

## 2021-05-25 MED ORDER — LEVOTHYROXINE SODIUM 75 MCG PO TABS
175.0000 ug | ORAL_TABLET | Freq: Every day | ORAL | Status: DC
  Administered 2021-05-25: 175 ug via ORAL
  Filled 2021-05-25: qty 1

## 2021-05-25 MED ORDER — ONDANSETRON HCL 4 MG PO TABS: 4.0000 mg | ORAL_TABLET | Freq: Four times a day (QID) | ORAL | Status: DC | PRN

## 2021-05-25 MED ORDER — ENSURE ENLIVE PO LIQD
237.0000 mL | Freq: Two times a day (BID) | ORAL | Status: DC
  Administered 2021-05-27 – 2021-05-28 (×2): 237 mL via ORAL
  Filled 2021-05-25: qty 237

## 2021-05-25 MED ORDER — MORPHINE SULFATE (PF) 2 MG/ML IV SOLN: 2.0000 mg | INTRAVENOUS | Status: DC | PRN

## 2021-05-25 MED ORDER — ACETAMINOPHEN 325 MG PO TABS
650.0000 mg | ORAL_TABLET | Freq: Four times a day (QID) | ORAL | Status: DC | PRN
  Administered 2021-05-25 – 2021-05-26 (×3): 650 mg via ORAL
  Filled 2021-05-25 (×3): qty 2

## 2021-05-25 MED ORDER — ONDANSETRON HCL 4 MG/2ML IJ SOLN: 4.0000 mg | Freq: Four times a day (QID) | INTRAMUSCULAR | Status: DC | PRN

## 2021-05-25 MED ORDER — OXYCODONE HCL 5 MG PO TABS: 5.0000 mg | ORAL_TABLET | ORAL | Status: DC | PRN

## 2021-05-25 MED ORDER — ACETAMINOPHEN 650 MG RE SUPP: 650.0000 mg | Freq: Four times a day (QID) | RECTAL | Status: DC | PRN

## 2021-05-25 MED ORDER — ALBUTEROL SULFATE (2.5 MG/3ML) 0.083% IN NEBU: 3.0000 mL | INHALATION_SOLUTION | Freq: Four times a day (QID) | RESPIRATORY_TRACT | Status: DC | PRN

## 2021-05-25 MED ORDER — LEVOTHYROXINE SODIUM 100 MCG PO TABS
200.0000 ug | ORAL_TABLET | Freq: Every day | ORAL | Status: DC
  Administered 2021-05-26 – 2021-05-28 (×3): 200 ug via ORAL
  Filled 2021-05-25 (×3): qty 2

## 2021-05-25 NOTE — ED Notes (Signed)
Replaced pt purewick due to it being soiled.

## 2021-05-25 NOTE — TOC Initial Note (Signed)
Transition of Care [facility]) - Initial/Assessment Note    Patient Details  Name: Jamie Montoya MRN: [id number] Date of Birth: 1945/11/28  Transition of [facility] CM/SW Contact:    Verdell Carmine, RN Phone Number: 05/25/2021, 5:04 PM  Clinical Narrative:                 75 yo patent wheelchair bound at home  with caretaker. [place] and presented to the ED. She did hit her head. CT reveals metastatis, previous ovarian cancer diagnosed. Was on hospice care but released. Cerebral edema present. Patient is still alert and can recall fall. She would be amenable to SNF placement. PT will be consulted to evaluate. Recommend palliative consult as well if not already ordered for [place] discussion.   TOC will follow for needs, recommendations,and transitions  Expected Discharge Plan: [place] Barriers to Discharge: Continued Medical Work up   Patient Goals and CMS Choice        Expected Discharge Plan and Services Expected Discharge Plan: [place] In-house Referral: Clinical Social Work Discharge Planning Services: CM Consult Post Acute Care Choice: [place] Living arrangements for the past 2 months: [place]                                      Prior Living Arrangements/Services Living arrangements for the past 2 months: Single Family Home Lives with:: Significant Other Artist) Patient language and need for interpreter reviewed:: Yes        Need for Family Participation in Patient Care: Yes (Comment) Care giver support system in place?: Yes (comment) Current home services: DME, Other (comment) (has wheelchair caretaker) Criminal Activity/Legal Involvement Pertinent to Current Situation/Hospitalization: No - Comment as needed  Activities of Daily Living      Permission Sought/Granted                  Emotional Assessment       Orientation: : Oriented to Self Alcohol / Substance Use: Not  Applicable Psych Involvement: No (comment)  Admission diagnosis:  Fall [W19.XXXA] Patient Active Problem List   Diagnosis Date Noted   Brain lesion 05/25/2021   Mild protein-calorie malnutrition ([place]) 05/25/2021   Fall 05/24/2021   Chronic kidney disease (CKD), stage IV (severe) ([place]) 05/04/2020   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   Anemia due to chronic illness 05/04/2020   Goals of care, counseling/discussion 05/04/2020   Diarrhea of presumed infectious origin    Adnexal mass    Acute respiratory failure with hypoxia ([place]) 11/25/2019   COPD with acute exacerbation ([place]) 11/25/2019   Thrombocytopenia ([place]) 11/25/2019   Essential hypertension 11/25/2019   Acute heart failure with normal ejection fraction ([place]) 07/18/2017   CHF exacerbation ([place]) 07/17/2017   Bilateral lower leg cellulitis 12/04/2016   Acute on chronic diastolic heart failure ([place]) 09/18/2016   AKI (acute kidney injury) ([place]) 08/26/2016   Paroxysmal SVT (supraventricular tachycardia) ([place]) 08/19/2016   Chronic diastolic CHF (congestive heart failure) ([place]) 08/13/2016   Hearing loss due to cerumen impaction, left 08/04/2016   Hypokalemia 08/01/2016   Chronic venous stasis dermatitis of both lower extremities 07/31/2016   Acute on chronic respiratory failure with hypoxia ([place]) 07/30/2016   HLD (hyperlipidemia) 07/30/2016   Hypothyroidism 07/30/2016   Anxiety 07/30/2016   History of colonic polyps    Personal history of adenomatous colonic polyps 11/13/2011   GERD  with stricture 09/20/2010   Obesity, Class III, BMI 40-49.9 (morbid obesity) ([place]) 09/06/2010   COPD (chronic obstructive pulmonary disease) ([place]) 09/06/2010   Ulcerative colitis, chronic ([place]) 09/06/2010   PCP:  Leonides Sake, MD Pharmacy:   [facility] [address] [place] [address] Phone: [phone] Fax: [phone]  [facility] ([place],  [address] [facility] [address] [place] [address] Phone: [phone] Fax: [place], [place] [address] Phone: [phone] Fax: [phone]  [id number] [facility] [address]Phone: [phone]: [phone] [id number] [place]one: [phone]: [phone]   Social Determinants of Health (SDOH) Interventions    Readmission Risk Interventions No flowsheet data found.

## 2021-05-25 NOTE — ED Notes (Signed)
Patient provided with graham crackers and ginger ale.  Reports HA has improvement.  Patient alert and oriented.  Able to recall falling yesterday

## 2021-05-25 NOTE — H&P (Signed)
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 intensity of service, high risk for further deterioration and high frequency of surveillance required.*  Disposition: Anticipated Discharge date 72 hours Discharge to home  or SNF  Time spent in minutes : [place]

## 2021-05-25 NOTE — Progress Notes (Signed)
Dr. Aileen Fass informed me that she has a known primary - high grade fallopian ca, easy decision in my opinion to proceed with SRS to hopefully improve seizure control as well as prevent further growth of the lesion, no surgical resection planned given her poor overall health status. Will add her on to tumor board for next week to start the process of getting her plugged in to get SRS.

## 2021-05-25 NOTE — Progress Notes (Addendum)
[facility] PROGRESS NOTE    Progress Note  Jamie Montoya  NWG:[id number] DOB: January 05, 1946 DOA: 05/24/2021 PCP: Leonides Sake, MD     Brief Narrative:   Jamie Montoya is an 75 y.o. female past medical history significant for chronic diarrhea due to ulcerative colitis, chronic diastolic heart failure, chronic respiratory failure is on 2 L of oxygen morbid obesity, paroxysmal supraventricular tachycardia, stage IV chronic kidney disease and a recent diagnosed on stage high-grade serous fallopian tube carcinoma, she has been followed by hospice in the past but they have released her. Has not been completely treated due to underlying comorbidities comes into the ED for chief complaint of a fall she relates that when she stood up got dizzy fell and hit her head, she was able to get back up and hit her head again, there is reports she not did loss consciousness  In the ED: CT of the head showed an 8 mm focus of hyperdensity in the right parietal lobe with significant surrounding edema suspicious for hemorrhagic metastases, large left frontoparietal scalp hematoma and a smaller hematoma of the left frontal head.  CT of the C-spine showed no acute findings. MRI of the brain showed 7 mm hemorrhagic lesion of the right parietal vertex with vasogenic edema likely due to hemorrhagic metastases, there are also scattered punctuated fossa of hemosiderin deposits  Assessment/Plan:   Fall: She relates she did not lose consciousness. She is basically wheelchair-bound lives alone with a caregiver. She is never discussed her goals of care and her transitions to home. She relates she would like to avoid a hospice home, but is open to rehab facility. 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 Will notify palliative care, social worker to transition her to a skilled nursing facility  as she is wheelchair-bound has a caregiver at home that comes twice a week.  Chronic respiratory failure on 2 L of oxygen: Seems to be stable on supplemental oxygen.  Chronic kidney disease stage IV: Creatinine appears to be at baseline we will start her on gentle IV fluid hydration recheck a basic metabolic panel in the morning, she relates some nausea and no appetite.  Hypothyroidism Continue with Synthroid.  Mild protein-calorie malnutrition ([place]) Counseling   DVT prophylaxis: SCD Family Communication:none Status is: Inpatient  Remains inpatient appropriate because: 75 year old with past medical history of chronic respiratory failure, ulcerative colitis with chronic diarrhea chronic disease stage IV with metastatic high-grade fallopian tube carcinoma and a hemorrhagic metastatic brain bleed        Code Status:     Code Status Orders  (From admission, onward)           Start     Ordered   05/25/21 0340  Full code  Continuous        05/25/21 0340           Code Status History     Date Active Date Inactive Code Status Order ID Comments User Context   11/25/2019 0026 12/01/2019 2115 Full Code [id number]  Orene Desanctis, DO ED   07/17/2017 2300 07/21/2017 1657 Full Code [id number]  Elwin Mocha, MD ED   08/19/2016 1955 08/21/2016 1605 Full Code [id number]  Isaiah Serge, NP ED   07/30/2016 2011 08/05/2016 1713 Full Code [id number]  Ivor Costa, MD ED         IV Access:   Peripheral IV  Procedures and diagnostic studies:   CT Head Wo Contrast  Result Date: 05/24/2021 CLINICAL DATA:  Fall, trauma EXAM: CT HEAD WITHOUT CONTRAST CT MAXILLOFACIAL WITHOUT CONTRAST TECHNIQUE: Multidetector CT imaging of the head and maxillofacial structures were performed using the standard protocol without intravenous contrast. Multiplanar CT image reconstructions of the maxillofacial structures were also generated. COMPARISON:  None. FINDINGS: CT HEAD FINDINGS Brain: There is a 0.8 cm  focus of hyperdensity in the right parietal lobe with significant surrounding vasogenic edema. The appearance is not typical for a hemorrhagic contusion. Otherwise, there is no evidence of acute intracranial hemorrhage or extra-axial fluid collection. Parenchymal volume is normal. The ventricles are normal in size. There is no midline shift. Vascular: There is calcification of the bilateral cavernous ICAs. Skull: There is a large left frontoparietal scalp hematoma. There is a smaller hematoma over the left forehead. There is no underlying calvarial fracture. Other: There is a small amount of fluid in the right mastoid air cells. No obstructing lesion is seen in the right nasopharynx. CT MAXILLOFACIAL FINDINGS Osseous: No acute facial bone fracture is identified. There is no evidence of mandibular dislocation. There is no suspicious osseous lesion. Orbits: Bilateral lens implants are in place. The globes and orbits are otherwise unremarkable. Sinuses: There is mild mucosal thickening in the left maxillary sinus. Soft tissues: Unremarkable. IMPRESSION: 1. 8 mm focus of hyperdensity in the right parietal lobe with significant surrounding edema is suspicious for a hemorrhagic metastasis. Recommend brain MRI with and without contrast for further evaluation. 2. Large left frontoparietal scalp hematoma and smaller hematoma in the left forehead without underlying calvarial fracture. 3. No facial bone fracture. Electronically Signed   By: Valetta Mole M.D.   On: 05/24/2021 13:49   CT Cervical Spine Wo Contrast  Result Date: 05/24/2021 CLINICAL DATA:  Head trauma EXAM: CT CERVICAL SPINE WITHOUT CONTRAST TECHNIQUE: Multidetector CT imaging of the cervical spine was performed without intravenous contrast. Multiplanar CT image reconstructions were also generated. COMPARISON:  None. FINDINGS: Alignment: There is no antero or retrolisthesis. There is no jumped or perched facets or other evidence of traumatic malalignment.  Skull base and vertebrae: Skull base alignment is maintained. Vertebral body heights are preserved. There is no evidence of acute fracture. Soft tissues and spinal canal: No prevertebral fluid or swelling. No visible canal hematoma. Disc levels: There is intervertebral disc space narrowing with mild endplate irregularity at C5-C6 through C7-T1. The osseous spinal canal and neural foramina are patent. Upper chest: The lung apices are clear. Other: None. IMPRESSION: No acute fracture or traumatic malalignment of the cervical spine. Electronically Signed   By: Valetta Mole M.D.   On: 05/24/2021 13:47   MR BRAIN WO CONTRAST  Result Date: 05/24/2021 CLINICAL DATA:  Fall with trauma to the head. Right parietal hemorrhage with surrounding edema of felt suspicious for a metastasis by CT. The patient would not allow postcontrast imaging. EXAM: MRI HEAD WITHOUT CONTRAST TECHNIQUE: Multiplanar, multiecho pulse sequences of the brain and surrounding structures were obtained without intravenous contrast. COMPARISON:  Head CT same day. FINDINGS: Brain: Diffusion imaging does not show any acute or subacute infarction. No focal abnormality affects the brainstem, cerebellum or left cerebral hemisphere. At the right parietal vertex, there is a 9-10 mm lesion with internal hemorrhage and surrounding vasogenic edema. No second lesion is identified on this noncontrast exam. The patient does also have a few scattered punctate foci of hemosiderin deposition elsewhere including in the cerebellum, and in a few foci of  the cerebral hemispheric white matter on both sides. Most likely diagnosis of the main lesion is hemorrhagic metastasis. Primary brain tumor is less likely. Inflammatory lesions such as neurocysticercosis could be considered. Postcontrast imaging suggested when the patient is able. No hydrocephalus.  No extra-axial collection. Vascular: Major vessels at the base of the brain show flow. Skull and upper cervical spine:  Negative Sinuses/Orbits: Clear/normal Other: Left-sided scalp hematomas. IMPRESSION: 9-10 mm hemorrhagic lesion at the right parietal vertex with surrounding vasogenic edema. Most likely diagnosis would be hemorrhagic metastatic lesion. Primary brain tumor is less likely. Inflammatory focus such as neurocysticercosis could be considered. The patient would not tolerate postcontrast imaging. That would be suggested when the patient is able. There are a few other scattered punctate foci of susceptibility artifact/hemosiderin deposition scattered elsewhere throughout the brain including within the cerebellum and both cerebral hemispheres. These could be better evaluated with repeat imaging as well. These do not appear to be associated with any discernible edema. The could be chronic microhemorrhages related to hypertension, could relate to the current or previous trauma or could conceivably represent additional hemorrhagic tiny lesions. Electronically Signed   By: Nelson Chimes M.D.   On: 05/24/2021 17:29   CT Maxillofacial WO CM  Result Date: 05/24/2021 CLINICAL DATA:  Fall, trauma EXAM: CT HEAD WITHOUT CONTRAST CT MAXILLOFACIAL WITHOUT CONTRAST TECHNIQUE: Multidetector CT imaging of the head and maxillofacial structures were performed using the standard protocol without intravenous contrast. Multiplanar CT image reconstructions of the maxillofacial structures were also generated. COMPARISON:  None. FINDINGS: CT HEAD FINDINGS Brain: There is a 0.8 cm focus of hyperdensity in the right parietal lobe with significant surrounding vasogenic edema. The appearance is not typical for a hemorrhagic contusion. Otherwise, there is no evidence of acute intracranial hemorrhage or extra-axial fluid collection. Parenchymal volume is normal. The ventricles are normal in size. There is no midline shift. Vascular: There is calcification of the bilateral cavernous ICAs. Skull: There is a large left frontoparietal scalp hematoma.  There is a smaller hematoma over the left forehead. There is no underlying calvarial fracture. Other: There is a small amount of fluid in the right mastoid air cells. No obstructing lesion is seen in the right nasopharynx. CT MAXILLOFACIAL FINDINGS Osseous: No acute facial bone fracture is identified. There is no evidence of mandibular dislocation. There is no suspicious osseous lesion. Orbits: Bilateral lens implants are in place. The globes and orbits are otherwise unremarkable. Sinuses: There is mild mucosal thickening in the left maxillary sinus. Soft tissues: Unremarkable. IMPRESSION: 1. 8 mm focus of hyperdensity in the right parietal lobe with significant surrounding edema is suspicious for a hemorrhagic metastasis. Recommend brain MRI with and without contrast for further evaluation. 2. Large left frontoparietal scalp hematoma and smaller hematoma in the left forehead without underlying calvarial fracture. 3. No facial bone fracture. Electronically Signed   By: Valetta Mole M.D.   On: 05/24/2021 13:49     Medical Consultants:   None.   Subjective:    [name] awake anorexic mildly nauseated, relates her pain is controlled  Objective:    Vitals:   05/25/21 0015 05/25/21 0030 05/25/21 0045 05/25/21 0600  BP: (!) 116/40 (!) 122/43 (!) 119/48 127/63  Pulse: 61 60 65 75  Resp: (!) 29 (!) 24 (!) 21 (!) 21  Temp:      TempSrc:      SpO2: 100% 100% 100% 94%  Weight:      Height:  SpO2: 94 %  No intake or output data in the 24 hours ending 05/25/21 0701 Filed Weights   05/24/21 1257  Weight: 117.9 kg    Exam: General exam: In no acute distress. Respiratory system: Good air movement and clear to auscultation. Cardiovascular system: S1 & S2 heard, RRR. No JVD. Gastrointestinal system: Abdomen is nondistended, soft and nontender.  Extremities: No pedal edema. Skin: No rashes, lesions or ulcers Psychiatry: Judgement and insight appear normal. Mood & affect appropriate.     Data Reviewed:    Labs: Basic Metabolic Panel: Recent Labs  Lab 05/24/21 1523  NA 141  K 4.8  CL 101  CO2 31  GLUCOSE 80  BUN 58*  CREATININE 2.10*  CALCIUM 9.0   GFR Estimated Creatinine Clearance: 28.3 mL/min (A) (by C-G formula based on SCr of 2.1 mg/dL (H)). Liver Function Tests: Recent Labs  Lab 05/24/21 1523  AST 18  ALT 19  ALKPHOS 135*  BILITOT 0.8  PROT 7.3  ALBUMIN 3.4*   No results for input(s): LIPASE, AMYLASE in the last 168 hours. No results for input(s): AMMONIA in the last 168 hours. Coagulation profile No results for input(s): INR, PROTIME in the last 168 hours. COVID-19 Labs  No results for input(s): DDIMER, FERRITIN, LDH, CRP in the last 72 hours.  Lab Results  Component Value Date   SARSCOV2NAA NEGATIVE 05/24/2021   [place] NEGATIVE 11/24/2019    CBC: Recent Labs  Lab 05/24/21 1523  WBC 5.7  NEUTROABS 4.6  HGB 8.8*  HCT 29.5*  MCV 103.5*  PLT PLATELET CLUMPS NOTED ON SMEAR, UNABLE TO ESTIMATE   Cardiac Enzymes: No results for input(s): CKTOTAL, CKMB, CKMBINDEX, TROPONINI in the last 168 hours. BNP (last 3 results) No results for input(s): PROBNP in the last 8760 hours. CBG: No results for input(s): GLUCAP in the last 168 hours. D-Dimer: No results for input(s): DDIMER in the last 72 hours. Hgb A1c: No results for input(s): HGBA1C in the last 72 hours. Lipid Profile: No results for input(s): CHOL, HDL, LDLCALC, TRIG, CHOLHDL, LDLDIRECT in the last 72 hours. Thyroid function studies: No results for input(s): TSH, T4TOTAL, T3FREE, THYROIDAB in the last 72 hours.  Invalid input(s): FREET3 Anemia work up: No results for input(s): VITAMINB12, FOLATE, FERRITIN, TIBC, IRON, RETICCTPCT in the last 72 hours. Sepsis Labs: Recent Labs  Lab 05/24/21 1523  WBC 5.7   Microbiology Recent Results (from the past 240 hour(s))  Resp Panel by RT-PCR (Flu A&B, Covid) Nasopharyngeal Swab     Status: None   Collection Time:  05/24/21  6:17 PM   Specimen: Nasopharyngeal Swab; Nasopharyngeal(NP) swabs in vial transport medium  Result Value Ref Range Status   SARS Coronavirus 2 by RT PCR NEGATIVE NEGATIVE Final    Comment: (NOTE) SARS-CoV-2 target nucleic acids are NOT DETECTED.  The SARS-CoV-2 RNA is generally detectable in upper respiratory specimens during the acute phase of infection. The lowest concentration of SARS-CoV-2 viral copies this assay can detect is 138 copies/mL. A negative result does not preclude SARS-Cov-2 infection and should not be used as the sole basis for treatment or other patient management decisions. A negative result may occur with  improper specimen collection/handling, submission of specimen other than nasopharyngeal swab, presence of viral mutation(s) within the areas targeted by this assay, and inadequate number of viral copies(<138 copies/mL). A negative result must be combined with clinical observations, patient history, and epidemiological information. The expected result is Negative.  Fact Sheet for Patients:  EntrepreneurPulse.com.au  Fact  Sheet for Healthcare Providers:  IncredibleEmployment.be  This test is no t yet approved or cleared by the [place] FDA and  has been authorized for detection and/or diagnosis of SARS-CoV-2 by FDA under an Emergency Use Authorization (EUA). This EUA will remain  in effect (meaning this test can be used) for the duration of the COVID-19 declaration under Section 564(b)(1) of the Act, 21 U.S.C.section 360bbb-3(b)(1), unless the authorization is terminated  or revoked sooner.       Influenza A by PCR NEGATIVE NEGATIVE Final   Influenza B by PCR NEGATIVE NEGATIVE Final    Comment: (NOTE) The Xpert Xpress SARS-CoV-2/FLU/RSV plus assay is intended as an aid in the diagnosis of influenza from Nasopharyngeal swab specimens and should not be used as a sole basis for treatment. Nasal washings  and aspirates are unacceptable for Xpert Xpress SARS-CoV-2/FLU/RSV testing.  Fact Sheet for Patients: EntrepreneurPulse.com.au  Fact Sheet for Healthcare Providers: IncredibleEmployment.be  This test is not yet approved or cleared by the [place] FDA and has been authorized for detection and/or diagnosis of SARS-CoV-2 by FDA under an Emergency Use Authorization (EUA). This EUA will remain in effect (meaning this test can be used) for the duration of the COVID-19 declaration under Section 564(b)(1) of the Act, 21 U.S.C. section 360bbb-3(b)(1), unless the authorization is terminated or revoked.  Performed at [facility], [place] [address] [place], [place] [address]      Medications:    acetaminophen  1,000 mg Oral Once   feeding supplement  237 mL Oral BID BM   levothyroxine  175 mcg Oral Daily   Continuous Infusions:    LOS: 1 day   Charlynne Cousins  [facility]  05/25/2021, 7:01 AM

## 2021-05-25 NOTE — Consult Note (Addendum)
Consultation Note Date: 05/25/2021   Patient Name: Jamie Montoya  DOB: 1946/05/04  MRN: [id number]  Age / Sex: 75 y.o., female  PCP: Leonides Sake, MD Referring Physician: No att. providers found  Reason for Consultation: Establishing goals of care  HPI/Patient Profile: 75 y.o. female  with past medical history of chronic diarrhea from ulcerative colitis, chronic diastolic CHF, chronic respiratory failure on 3 L nasal cannula,  COPD, GERD, hyperlipidemia, hypothyroidism, morbid obesity, paroxysmal SVT, inoperable malignant tumor in her abdomen (ovarian cancer), and CKD stage IV presented to the ED on 05/24/21 from home after experiencing a fall with head injury. Head CT revealed left frontal and parietal hematoma without traumatic finding; however, right parietal hyperdensity was seen concerning for hemorrhagic metastasis with edema. Fall was felt likely due to focal LLE seizure. Neurosurgery has seen and because primary is known, they do not feel pathology from brain met is needed and feel it likely can be treated with SRS. SRS is recommended to hopefully improve seizure control as well as prevent further growth of the lesion, no surgical resection planned given her poor overall health status. Patient was admitted on 05/24/2021 with fall, seizure, brain lesion, mild protein calorie malnutrition.     Clinical Assessment and Goals of Care: I have reviewed medical records including EPIC notes, labs, and imaging. Received report from primary RN - no acute concerns.   Went to visit patient at bedside - no family/visitors present. Patient was lying in bed awake, alert, oriented, and able to participate in conversation. No signs or non-verbal gestures of pain or discomfort noted. No respiratory distress, increased work of breathing, or secretions noted. She denies pain.  Met with patient  to discuss diagnosis,  prognosis, GOC, EOL wishes, disposition, and options.  I introduced Palliative Medicine as specialized medical care for people living with serious illness. It focuses on providing relief from the symptoms and stress of a serious illness. The goal is to improve quality of life for both the patient and the family.  We discussed a brief life review of the patient as well as functional and nutritional status. She is not currently married - she has 2 daughters with health issues - patient tells me she is not close with either of her daughters. Prior to hospitalization, patient was living in a private residence alone. She was able to transfer independently to a wheelchair and maneuver around her home this way. She was able to dress and bathe herself but does have a home health aid visit with her 3 days per week to assist with errands/household needs/cooking. She reports having a good appetite until about 1 month ago - she states her appetite has "gone downhill." She does not associate this with nausea; just does not feel hungry. Patient tells me she was first diagnosed with abdominal cancer "several years ago" - she did not undergo treatment, but opted for transition into hospice at that time. She was in hospice care for "about 6 months" until she decided to pursue rehab (hospice  will not follow patients in rehab) and she was ultimately discharged home where she has been until now.  We discussed patient's current illness and what it means in the larger context of patient's on-going co-morbidities. Patient understands that CHF, CKD, COPD are progressive, non-curable disease underlying the patient's current acute medical conditions. Natural disease trajectory and expectations at EOL were discussed. I attempted to elicit values and goals of care important to the patient. The difference between aggressive medical intervention (SRS, rehab/HHPT, rehospitalizations) and comfort care was considered in light of the patient's  goals of care. Prognostication was reviewed - per patient she was told she could have <1yo or less. At this time, patient is not sure if she wants to pursue [place] treatment - I feel she is trying to process her diagnosis and all information she has received in a short period of time. She does state that she "wants to live." She is clear she is not ready for hospice care again at this time. She is willing to work with PT, but does prefer to return home with HHPT if reasonable - plan to let PT assess and provide recommendations prior to decisions. We did discuss her high risk of rehospitalization if she does not discharge with hospice care - she expressed understanding.   Advance directives, concepts specific to code status, artificial feeding and hydration, and rehospitalization were considered and discussed. She does not have a Living Will or HCPOA - she would want her niece/Wanda Hovander to be surrogate Media planner. She is agreeable to complete HCPOA while in house - will consult chaplain.   Encouraged patient to consider DNR/DNI status understanding evidenced based poor outcomes in similar hospitalized patient, as the cause of arrest is likely associated with advanced chronic/terminal illness rather than an easily reversible acute cardio-pulmonary event. I explained that DNR/DNI does not change the medical plan and it only comes into effect after a person has arrested (died).  It is a protective measure to keep [place] from harming the patient in their last moments of life. Patient was not agreeable to DNR/DNI with understanding that she would receive CPR, defibrillation, ACLS medications, and/or intubation. We did discuss that if she were to survive a resuscitative event she would continue to be in poor health with likely poor quality of life. We discussed setting medical boundaries - she would only want these interventions attempted once and would not want her life prolonged by trach/PEG. I feel as her  prognosis shortens, she will be more open for changing code status.   Offered chaplain for emotional and spiritual support - she declined.  She wants to use "[employer]" upon her passing - she has paid for everything there.  Discussed with patient the importance of continued conversation with each other and the medical providers regarding overall plan of care and treatment options, ensuring decisions are within the context of the patient's values and GOCs.    Questions and concerns were addressed. The patient/family was encouraged to call with questions and/or concerns. PMT card was provided.   Primary Decision Maker: PATIENT    SUMMARY OF RECOMMENDATIONS   Continue full scope interventions to treat the treatable Continue full code status - she would want one attempt at resuscitation only and does not want her life prolonged by trach/PEG. As her prognosis shortens, she will be more open to changing code status  She is not sure yet if she wants to pursue [place] treatment - I feel she is trying to  process all information she has received in a short period of time. She is open to tumor board discussing her case next week She is clear she is not ready for hospice care and is interested in pursing PT if recommended. She would prefer HHPT vs rehab, but seems open to rehab if needed. Recommend outpatient Palliative Care if she discharges without hospice. She needs continued education around safety expectations Chaplain consulted for: Niece/Wanda Hovander to be HCPOA PMT will continue to follow intermittently. If there are any imminent needs please call the service directly   Code Status/Advance Care Planning: Full code  Palliative Prophylaxis:  Aspiration, Bowel Regimen, Delirium Protocol, Frequent Pain Assessment, Oral Care, and Turn Reposition  Additional Recommendations (Limitations, Scope, Preferences): Full Scope Treatment, No Artificial Feeding, and No  Tracheostomy  Psycho-social/Spiritual:  Desire for further Chaplaincy support:no Created space and opportunity for patient to express thoughts and feelings regarding patient's current medical situation.  Emotional support and therapeutic listening provided.  Prognosis:  < 12 months  Discharge Planning: [place] for rehab with Palliative care service follow-up      Primary Diagnoses: Present on Admission:  Fall  Brain lesion  Hypothyroidism   I have reviewed the medical record, interviewed the patient and family, and examined the patient. The following aspects are pertinent.  Past Medical History:  Diagnosis Date   Cataract    bilateral   Chronic diarrhea    pt reports r/t Crohn's   Chronic diastolic CHF (congestive heart failure) (HCC)    Chronic edema    bilateral lower extremity edema   Chronic respiratory failure (HCC)    3L   COPD (chronic obstructive pulmonary disease) (HCC)    Esophageal stricture    GERD (gastroesophageal reflux disease) 09/10/10   Hiatal hernia 09/10/10   1-2 cm   Hyperlipidemia    borderline   Hypokalemia    Hypothyroidism    Hypoxia 11/2019   Morbid obesity ([place])    Oxygen deficiency    has 02 at home to use as needed. no use in the last several months 2.5L as needed    Paroxysmal SVT (supraventricular tachycardia) (HCC)    Stricture and stenosis of esophagus 09/10/2010   Ulcerative colitis, universal ([place]) 09/10/2010   endoscopic changes in rectum and sigmoid, microscopic elsewhere   Social History   Socioeconomic History   Marital status: Divorced    Spouse name: Not on file   Number of children: 2   Years of education: Not on file   Highest education level: Not on file  Occupational History   Occupation: retired [employer]  Tobacco Use   Smoking status: Former    Types: Cigarettes    Quit date: 06/04/2003    Years since quitting: 17.9   Smokeless tobacco: Never  Vaping Use   Vaping Use: Never used   Substance and Sexual Activity   Alcohol use: No    Alcohol/week: 0.0 standard drinks    Comment: occasional ,very rare   Drug use: No   Sexual activity: Not on file  Other Topics Concern   Not on file  Social History Narrative   Not on file   Social Determinants of Health   Financial Resource Strain: Not on file  Food Insecurity: Not on file  Transportation Needs: Not on file  Physical Activity: Not on file  Stress: Not on file  Social Connections: Not on file   Family History  Problem Relation Age of Onset   Heart disease Father  Peripheral vascular disease Father    Esophageal cancer Mother    Colon cancer Neg Hx    Scheduled Meds:  acetaminophen  1,000 mg Oral Once   feeding supplement  237 mL Oral BID BM   levothyroxine  175 mcg Oral Daily   Continuous Infusions: PRN Meds:.acetaminophen **OR** acetaminophen, albuterol, morphine injection, ondansetron **OR** ondansetron (ZOFRAN) IV, oxyCODONE Medications Prior to Admission:  Prior to Admission medications   Medication Sig Start Date End Date Taking? Authorizing Provider  albuterol (PROVENTIL) (2.5 MG/3ML) 0.083% nebulizer solution Take 3 mLs by nebulization 4 (four) times daily as needed for wheezing or shortness of breath.  06/27/16  Yes [provider]  albuterol (VENTOLIN HFA) 108 (90 Base) MCG/ACT inhaler Inhale 2 puffs into the lungs every 6 (six) hours as needed for wheezing or shortness of breath. 12/15/19  Yes Dorothy Spark, MD  Biotin 5000 MCG TABS Take 1 tablet by mouth in the morning.    Yes [provider]  levothyroxine (SYNTHROID) 175 MCG tablet Take 175 mcg by mouth daily. 11/08/19  Yes [provider]  nystatin (MYCOSTATIN/NYSTOP) powder Apply 1 application topically daily as needed (Dry skin). 10/27/19  Yes [provider]  torsemide (DEMADEX) 20 MG tablet Take 1 tablet (20 mg total) by mouth every morning. Patient taking differently: Take 20 mg by mouth daily.  02/27/21  Yes [name], MD   Allergies  Allergen Reactions   Tape Rash   Review of Systems  Constitutional:  Positive for activity change, appetite change and fatigue.  Respiratory:  Negative for shortness of breath.   Gastrointestinal:  Negative for nausea and vomiting.  Neurological:  Positive for weakness.  All other systems reviewed and are negative.  Physical Exam Vitals and nursing note reviewed.  Constitutional:      General: She is not in acute distress.    Appearance: She is ill-appearing.  Pulmonary:     Effort: No respiratory distress.  Skin:    General: Skin is warm and dry.  Neurological:     Mental Status: She is alert and oriented to person, place, and time.     Motor: Weakness present.  Psychiatric:        Attention and Perception: Attention normal.        Behavior: Behavior is cooperative.        Cognition and Memory: Cognition and memory normal.    Vital Signs: BP (!) 124/47   Pulse 75   Temp 97.6 F (36.4 C)   Resp (!) 21   Ht _0  (1.626 m)   Wt 117.9 kg   SpO2 99%   BMI 44.63 kg/m      Pain Score: 0-No pain   SpO2: SpO2: 99 % O2 Device:SpO2: 99 % O2 Flow Rate: .   IO: Intake/output summary: No intake or output data in the 24 hours ending 05/25/21 1424  LBM:   Baseline Weight: Weight: 117.9 kg Most recent weight: Weight: 117.9 kg     Palliative Assessment/Data: PPS 40%     Time In: 1500 Time Out: 1612 Time Total: 72 minutes  Greater than 50%  of this time was spent counseling and coordinating care related to the above assessment and plan.  Signed by: Lin Landsman, NP   Please contact Palliative Medicine Team phone at [phone] for questions and concerns.  For individual provider: See Shea Evans

## 2021-05-26 LAB — CA 125: Cancer Antigen (CA) 125: 152 U/mL — ABNORMAL HIGH (ref 0.0–38.1)

## 2021-05-26 NOTE — Plan of Care (Signed)

## 2021-05-26 NOTE — Progress Notes (Signed)
[facility] PROGRESS NOTE    Progress Note  Jamie Montoya  ENM:[id number] DOB: [birth year] DOA: 05/24/2021 PCP: Leonides Sake, MD     Brief Narrative:   Jamie Montoya is an 75 y.o. female past medical history significant for chronic diarrhea due to ulcerative colitis, chronic diastolic heart failure, chronic respiratory failure is on 2 L of oxygen morbid obesity, paroxysmal supraventricular tachycardia, stage IV chronic kidney disease and a recent diagnosed on stage high-grade serous fallopian tube carcinoma, she has been followed by hospice in the past but they have released her. Has not been completely treated due to underlying comorbidities comes into the ED for chief complaint of a fall she relates that when she stood up got dizzy fell and hit her head, she was able to get back up and hit her head again, there is reports she not did loss consciousness  In the ED: CT of the head showed an 8 mm focus of hyperdensity in the right parietal lobe with significant surrounding edema suspicious for hemorrhagic metastases, large left frontoparietal scalp hematoma and a smaller hematoma of the left frontal head.  CT of the C-spine showed no acute findings. MRI of the brain showed 7 mm hemorrhagic lesion of the right parietal vertex with vasogenic edema likely due to hemorrhagic metastases, there are also scattered punctuated fossa of hemosiderin deposits  Assessment/Plan:   Fall: She relates she did not lose consciousness. She is basically wheelchair-bound lives alone with a caregiver. She is never discussed her goals of care and her transitions to home. She relates she would like to avoid a hospice home, but is open to rehab facility. Less than 4 metabolic equivalents. Awaiting PT OT evaluation, get TOC involved for skilled nursing facility placement.  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 Will  notify palliative care, social worker to transition her to a skilled nursing facility as she is wheelchair-bound has a caregiver at home that comes twice a week. She wants to continue full scope of treatment, does not want life-prolonging trach or PEG after talking with the [facility] care. Discussed the case with her oncologist Dr. Florene Glen who has nothing else to offer. Patient has unrealistic expectations about how, she started about going home, awaiting physical therapy evaluation.  Chronic respiratory failure on 2 L of oxygen: Seems to be stable on supplemental oxygen.  Chronic kidney disease stage IV: Creatinine appears to be at baseline we will start her on gentle IV fluid hydration recheck a basic metabolic panel in the morning, she relates some nausea and no appetite.  Hypothyroidism Continue with Synthroid.  Mild protein-calorie malnutrition ([place]) Counseling   DVT prophylaxis: SCD Family Communication:none Status is: Inpatient  Remains inpatient appropriate because: 75 year old with past medical history of chronic respiratory failure, ulcerative colitis with chronic diarrhea chronic disease stage IV with metastatic high-grade fallopian tube carcinoma and a hemorrhagic metastatic brain bleed      Code Status:     Code Status Orders  (From admission, onward)           Start     Ordered   05/25/21 0340  Full code  Continuous        05/25/21 0340           Code Status History     Date Active Date Inactive Code Status Order ID Comments User Context   11/25/2019 0026 12/01/2019 2115 Full Code [id number]  Orene Desanctis,  DO ED   07/17/2017 2300 07/21/2017 1657 Full Code [id number]  Elwin Mocha, MD ED   08/19/2016 1955 08/21/2016 1605 Full Code [id number]  Isaiah Serge, NP ED   07/30/2016 2011 08/05/2016 1713 Full Code [id number]  Ivor Costa, MD ED         IV Access:   Peripheral IV   Procedures and diagnostic studies:   CT Head Wo Contrast  Result Date:  05/24/2021 CLINICAL DATA:  Fall, trauma EXAM: CT HEAD WITHOUT CONTRAST CT MAXILLOFACIAL WITHOUT CONTRAST TECHNIQUE: Multidetector CT imaging of the head and maxillofacial structures were performed using the standard protocol without intravenous contrast. Multiplanar CT image reconstructions of the maxillofacial structures were also generated. COMPARISON:  None. FINDINGS: CT HEAD FINDINGS Brain: There is a 0.8 cm focus of hyperdensity in the right parietal lobe with significant surrounding vasogenic edema. The appearance is not typical for a hemorrhagic contusion. Otherwise, there is no evidence of acute intracranial hemorrhage or extra-axial fluid collection. Parenchymal volume is normal. The ventricles are normal in size. There is no midline shift. Vascular: There is calcification of the bilateral cavernous ICAs. Skull: There is a large left frontoparietal scalp hematoma. There is a smaller hematoma over the left forehead. There is no underlying calvarial fracture. Other: There is a small amount of fluid in the right mastoid air cells. No obstructing lesion is seen in the right nasopharynx. CT MAXILLOFACIAL FINDINGS Osseous: No acute facial bone fracture is identified. There is no evidence of mandibular dislocation. There is no suspicious osseous lesion. Orbits: Bilateral lens implants are in place. The globes and orbits are otherwise unremarkable. Sinuses: There is mild mucosal thickening in the left maxillary sinus. Soft tissues: Unremarkable. IMPRESSION: 1. 8 mm focus of hyperdensity in the right parietal lobe with significant surrounding edema is suspicious for a hemorrhagic metastasis. Recommend brain MRI with and without contrast for further evaluation. 2. Large left frontoparietal scalp hematoma and smaller hematoma in the left forehead without underlying calvarial fracture. 3. No facial bone fracture. Electronically Signed   By: Valetta Mole M.D.   On: 05/24/2021 13:49   CT Cervical Spine Wo  Contrast  Result Date: 05/24/2021 CLINICAL DATA:  Head trauma EXAM: CT CERVICAL SPINE WITHOUT CONTRAST TECHNIQUE: Multidetector CT imaging of the cervical spine was performed without intravenous contrast. Multiplanar CT image reconstructions were also generated. COMPARISON:  None. FINDINGS: Alignment: There is no antero or retrolisthesis. There is no jumped or perched facets or other evidence of traumatic malalignment. Skull base and vertebrae: Skull base alignment is maintained. Vertebral body heights are preserved. There is no evidence of acute fracture. Soft tissues and spinal canal: No prevertebral fluid or swelling. No visible canal hematoma. Disc levels: There is intervertebral disc space narrowing with mild endplate irregularity at C5-C6 through C7-T1. The osseous spinal canal and neural foramina are patent. Upper chest: The lung apices are clear. Other: None. IMPRESSION: No acute fracture or traumatic malalignment of the cervical spine. Electronically Signed   By: Valetta Mole M.D.   On: 05/24/2021 13:47   MR BRAIN WO CONTRAST  Result Date: 05/24/2021 CLINICAL DATA:  Fall with trauma to the head. Right parietal hemorrhage with surrounding edema of felt suspicious for a metastasis by CT. The patient would not allow postcontrast imaging. EXAM: MRI HEAD WITHOUT CONTRAST TECHNIQUE: Multiplanar, multiecho pulse sequences of the brain and surrounding structures were obtained without intravenous contrast. COMPARISON:  Head CT same day. FINDINGS: Brain: Diffusion imaging does not show any acute or subacute infarction.  No focal abnormality affects the brainstem, cerebellum or left cerebral hemisphere. At the right parietal vertex, there is a 9-10 mm lesion with internal hemorrhage and surrounding vasogenic edema. No second lesion is identified on this noncontrast exam. The patient does also have a few scattered punctate foci of hemosiderin deposition elsewhere including in the cerebellum, and in a few foci of  the cerebral hemispheric white matter on both sides. Most likely diagnosis of the main lesion is hemorrhagic metastasis. Primary brain tumor is less likely. Inflammatory lesions such as neurocysticercosis could be considered. Postcontrast imaging suggested when the patient is able. No hydrocephalus.  No extra-axial collection. Vascular: Major vessels at the base of the brain show flow. Skull and upper cervical spine: Negative Sinuses/Orbits: Clear/normal Other: Left-sided scalp hematomas. IMPRESSION: 9-10 mm hemorrhagic lesion at the right parietal vertex with surrounding vasogenic edema. Most likely diagnosis would be hemorrhagic metastatic lesion. Primary brain tumor is less likely. Inflammatory focus such as neurocysticercosis could be considered. The patient would not tolerate postcontrast imaging. That would be suggested when the patient is able. There are a few other scattered punctate foci of susceptibility artifact/hemosiderin deposition scattered elsewhere throughout the brain including within the cerebellum and both cerebral hemispheres. These could be better evaluated with repeat imaging as well. These do not appear to be associated with any discernible edema. The could be chronic microhemorrhages related to hypertension, could relate to the current or previous trauma or could conceivably represent additional hemorrhagic tiny lesions. Electronically Signed   By: Nelson Chimes M.D.   On: 05/24/2021 17:29   CT Maxillofacial WO CM  Result Date: 05/24/2021 CLINICAL DATA:  Fall, trauma EXAM: CT HEAD WITHOUT CONTRAST CT MAXILLOFACIAL WITHOUT CONTRAST TECHNIQUE: Multidetector CT imaging of the head and maxillofacial structures were performed using the standard protocol without intravenous contrast. Multiplanar CT image reconstructions of the maxillofacial structures were also generated. COMPARISON:  None. FINDINGS: CT HEAD FINDINGS Brain: There is a 0.8 cm focus of hyperdensity in the right parietal lobe with  significant surrounding vasogenic edema. The appearance is not typical for a hemorrhagic contusion. Otherwise, there is no evidence of acute intracranial hemorrhage or extra-axial fluid collection. Parenchymal volume is normal. The ventricles are normal in size. There is no midline shift. Vascular: There is calcification of the bilateral cavernous ICAs. Skull: There is a large left frontoparietal scalp hematoma. There is a smaller hematoma over the left forehead. There is no underlying calvarial fracture. Other: There is a small amount of fluid in the right mastoid air cells. No obstructing lesion is seen in the right nasopharynx. CT MAXILLOFACIAL FINDINGS Osseous: No acute facial bone fracture is identified. There is no evidence of mandibular dislocation. There is no suspicious osseous lesion. Orbits: Bilateral lens implants are in place. The globes and orbits are otherwise unremarkable. Sinuses: There is mild mucosal thickening in the left maxillary sinus. Soft tissues: Unremarkable. IMPRESSION: 1. 8 mm focus of hyperdensity in the right parietal lobe with significant surrounding edema is suspicious for a hemorrhagic metastasis. Recommend brain MRI with and without contrast for further evaluation. 2. Large left frontoparietal scalp hematoma and smaller hematoma in the left forehead without underlying calvarial fracture. 3. No facial bone fracture. Electronically Signed   By: Valetta Mole M.D.   On: 05/24/2021 13:49     Medical Consultants:   None.   Subjective:    Jamie Montoya wants to go home no new complaints.  Objective:    Vitals:   05/25/21 2316 05/25/21 2320 05/26/21  0335 05/26/21 0822  BP: (!) 95/32 (!) 89/42  (!) 105/45  Pulse:  61 67 78  Resp: 20 17  (!) 25  Temp:  98 F (36.7 C) 97.6 F (36.4 C) 98 F (36.7 C)  TempSrc:  Oral Oral Oral  SpO2:  97% 98% 98%  Weight:      Height:       SpO2: 98 %   Intake/Output Summary (Last 24 hours) at 05/26/2021 0925 Last data filed at  05/26/2021 4401 Gross per 24 hour  Intake --  Output 500 ml  Net -500 ml   Filed Weights   05/24/21 1257  Weight: 117.9 kg    Exam: General exam: In no acute distress. Respiratory system: Good air movement and clear to auscultation. Cardiovascular system: S1 & S2 heard, RRR. No JVD. Gastrointestinal system: Abdomen is nondistended, soft and nontender.  Extremities: No pedal edema. Skin: No rashes, lesions or ulcers  Data Reviewed:    Labs: Basic Metabolic Panel: Recent Labs  Lab 05/24/21 1523 05/25/21 0602  NA 141 139  K 4.8 4.5  CL 101 102  CO2 31 32  GLUCOSE 80 117*  BUN 58* 57*  CREATININE 2.10* 2.14*  CALCIUM 9.0 8.8*  MG  --  1.9    GFR Estimated Creatinine Clearance: 27.8 mL/min (A) (by C-G formula based on SCr of 2.14 mg/dL (H)). Liver Function Tests: Recent Labs  Lab 05/24/21 1523 05/25/21 0602  AST 18 17  ALT 19 17  ALKPHOS 135* 130*  BILITOT 0.8 0.5  PROT 7.3 6.7  ALBUMIN 3.4* 3.0*    No results for input(s): LIPASE, AMYLASE in the last 168 hours. No results for input(s): AMMONIA in the last 168 hours. Coagulation profile No results for input(s): INR, PROTIME in the last 168 hours. COVID-19 Labs  No results for input(s): DDIMER, FERRITIN, LDH, CRP in the last 72 hours.  Lab Results  Component Value Date   SARSCOV2NAA NEGATIVE 05/24/2021   Pump Back NEGATIVE 11/24/2019    CBC: Recent Labs  Lab 05/24/21 1523 05/25/21 0602  WBC 5.7 4.0  NEUTROABS 4.6 2.6  HGB 8.8* 8.5*  HCT 29.5* 28.6*  MCV 103.5* 103.6*  PLT PLATELET CLUMPS NOTED ON SMEAR, UNABLE TO ESTIMATE 100*    Cardiac Enzymes: No results for input(s): CKTOTAL, CKMB, CKMBINDEX, TROPONINI in the last 168 hours. BNP (last 3 results) No results for input(s): PROBNP in the last 8760 hours. CBG: No results for input(s): GLUCAP in the last 168 hours. D-Dimer: No results for input(s): DDIMER in the last 72 hours. Hgb A1c: No results for input(s): HGBA1C in the last 72  hours. Lipid Profile: No results for input(s): CHOL, HDL, LDLCALC, TRIG, CHOLHDL, LDLDIRECT in the last 72 hours. Thyroid function studies: No results for input(s): TSH, T4TOTAL, T3FREE, THYROIDAB in the last 72 hours.  Invalid input(s): FREET3 Anemia work up: No results for input(s): VITAMINB12, FOLATE, FERRITIN, TIBC, IRON, RETICCTPCT in the last 72 hours. Sepsis Labs: Recent Labs  Lab 05/24/21 1523 05/25/21 0602  WBC 5.7 4.0    Microbiology Recent Results (from the past 240 hour(s))  Resp Panel by RT-PCR (Flu A&B, Covid) Nasopharyngeal Swab     Status: None   Collection Time: 05/24/21  6:17 PM   Specimen: Nasopharyngeal Swab; Nasopharyngeal(NP) swabs in vial transport medium  Result Value Ref Range Status   SARS Coronavirus 2 by RT PCR NEGATIVE NEGATIVE Final    Comment: (NOTE) SARS-CoV-2 target nucleic acids are NOT DETECTED.  The SARS-CoV-2 RNA is  generally detectable in upper respiratory specimens during the acute phase of infection. The lowest concentration of SARS-CoV-2 viral copies this assay can detect is 138 copies/mL. A negative result does not preclude SARS-Cov-2 infection and should not be used as the sole basis for treatment or other patient management decisions. A negative result may occur with  improper specimen collection/handling, submission of specimen other than nasopharyngeal swab, presence of viral mutation(s) within the areas targeted by this assay, and inadequate number of viral copies(<138 copies/mL). A negative result must be combined with clinical observations, patient history, and epidemiological information. The expected result is Negative.  Fact Sheet for Patients:  EntrepreneurPulse.com.au  Fact Sheet for Healthcare Providers:  IncredibleEmployment.be  This test is no t yet approved or cleared by the [place] FDA and  has been authorized for detection and/or diagnosis of SARS-CoV-2 by FDA under an  Emergency Use Authorization (EUA). This EUA will remain  in effect (meaning this test can be used) for the duration of the COVID-19 declaration under Section 564(b)(1) of the Act, 21 U.S.C.section 360bbb-3(b)(1), unless the authorization is terminated  or revoked sooner.       Influenza A by PCR NEGATIVE NEGATIVE Final   Influenza B by PCR NEGATIVE NEGATIVE Final    Comment: (NOTE) The Xpert Xpress SARS-CoV-2/FLU/RSV plus assay is intended as an aid in the diagnosis of influenza from Nasopharyngeal swab specimens and should not be used as a sole basis for treatment. Nasal washings and aspirates are unacceptable for Xpert Xpress SARS-CoV-2/FLU/RSV testing.  Fact Sheet for Patients: EntrepreneurPulse.com.au  Fact Sheet for Healthcare Providers: IncredibleEmployment.be  This test is not yet approved or cleared by the [place] FDA and has been authorized for detection and/or diagnosis of SARS-CoV-2 by FDA under an Emergency Use Authorization (EUA). This EUA will remain in effect (meaning this test can be used) for the duration of the COVID-19 declaration under Section 564(b)(1) of the Act, 21 U.S.C. section 360bbb-3(b)(1), unless the authorization is terminated or revoked.  Performed at [facility], [place] [address] [place], [place] [address]      Medications:    acetaminophen  1,000 mg Oral Once   feeding supplement  237 mL Oral BID BM   levothyroxine  200 mcg Oral Q0600   Continuous Infusions:    LOS: 2 days   Charlynne Cousins  [facility]  05/26/2021, 9:25 AM

## 2021-05-26 NOTE — Evaluation (Addendum)
Physical Therapy Evaluation Patient Details Name: Jamie Montoya MRN: [id number] DOB: Dec 18, 1945 Today's Date: 05/26/2021  History of Present Illness  75 y.o. female presents to [facility] hospital on 05/24/2021 with fall, possible syncope. CT head demonstrates 8 mm focus of hyperdensity in the right parietal lobe with significant surrounding edema suspicious for hemorrhagic metastases. PMH includes chronic diarrhea due to ulcerative colitis, chronic diastolic heart failure, chronic respiratory failure is on 2 L of oxygen morbid obesity, paroxysmal supraventricular tachycardia, stage IV chronic kidney disease and a recent diagnosed on stage high-grade serous fallopian tube carcinoma.  Clinical Impression  Pt presents to PT with deficits in endurance and power, but is likely not far from her baseline. Pt is able to ambulate for limited household distances at this time, while reporting she seldomly ambulates at home other than into the bathroom. Pt typically utilizes a power wheelchair to mobilize in the home and demonstrates the ability to transfer from different surfaces without assistance at this time. PT recommends return home with HHPT services when medically ready.       Recommendations for follow up therapy are one component of a multi-disciplinary discharge planning process, led by the attending physician.  Recommendations may be updated based on patient status, additional functional criteria and insurance authorization.  Follow Up Recommendations Home health PT    Assistance Recommended at Discharge Intermittent Supervision/Assistance  Functional Status Assessment Patient has had a recent decline in their functional status and demonstrates the ability to make significant improvements in function in a reasonable and predictable amount of time.  Equipment Recommendations  None recommended by PT    Recommendations for Other Services       Precautions / Restrictions Precautions Precautions:  Fall Restrictions Weight Bearing Restrictions: No      Mobility  Bed Mobility Overal bed mobility: Needs Assistance Bed Mobility: Supine to Sit;Sit to Supine     Supine to sit: Min assist;HOB elevated Sit to supine: Supervision   General bed mobility comments: pt utilizes PT hand hold to pull trunk up    Transfers Overall transfer level: Needs assistance Equipment used: Rolling walker (2 wheels) Transfers: Sit to/from Stand Sit to Stand: Supervision                Ambulation/Gait Ambulation/Gait assistance: Supervision Gait Distance (Feet): 30 Feet (30', 15' x 2) Assistive device: Rolling walker (2 wheels) Gait Pattern/deviations: Step-through pattern Gait velocity: reduced Gait velocity interpretation: <1.8 ft/sec, indicate of risk for recurrent falls   General Gait Details: pt with slowed step-through gait, widened BOS  Stairs            Wheelchair Mobility    Modified Rankin (Stroke Patients Only)       Balance Overall balance assessment: Needs assistance Sitting-balance support: No upper extremity supported;Feet supported Sitting balance-Leahy Scale: Good     Standing balance support: Bilateral upper extremity supported;Single extremity supported;Reliant on assistive device for balance Standing balance-Leahy Scale: Poor Standing balance comment: brief period of standing at sink washing hands without UE support                             Pertinent Vitals/Pain Pain Assessment: Faces Faces Pain Scale: Hurts little more Pain Location: head Pain Descriptors / Indicators: Aching Pain Intervention(s): Monitored during session    Home Living Family/patient expects to be discharged to:: Private residence Living Arrangements: Alone Available Help at Discharge: Personal care attendant (M/W/F 5 hours) Type of Home:  House Home Access: Ramped entrance       Home Layout: One level Home Equipment: Conservation officer, nature (2 wheels);Rollator  (4 wheels);Wheelchair - Education officer, community - power;Toilet riser;Hospital bed Additional Comments: pt reports she is unable to utilize her PWC in the bathroom, she utilizes UE support of furniture to make it to toilet    Prior Function Prior Level of Function : Independent/Modified Independent             Mobility Comments: pt reports transferring from bed to power wheelchair, only ambulating from entrance of bathroom to commode with UE support of furniture       Hand Dominance   Dominant Hand: Right    Extremity/Trunk Assessment   Upper Extremity Assessment Upper Extremity Assessment: Overall WFL for tasks assessed    Lower Extremity Assessment Lower Extremity Assessment: Overall WFL for tasks assessed    Cervical / Trunk Assessment Cervical / Trunk Assessment: Other exceptions Cervical / Trunk Exceptions: morbid obesity  Communication   Communication: No difficulties  Cognition Arousal/Alertness: Awake/alert Behavior During Therapy: WFL for tasks assessed/performed Overall Cognitive Status: Within Functional Limits for tasks assessed                                          General Comments General comments (skin integrity, edema, etc.): pt on 3L [place] upon PT arrival, reports she utilizes oxygen PRN at home, performs mobility during session without oxygen, denies SOB    Exercises     Assessment/Plan    PT Assessment Patient needs continued PT services  PT Problem List Decreased strength;Decreased activity tolerance;Decreased balance;Decreased mobility;Cardiopulmonary status limiting activity       PT Treatment Interventions DME instruction;Gait training;Functional mobility training;Therapeutic activities;Therapeutic exercise;Balance training;Neuromuscular re-education;Patient/family education    PT Goals (Current goals can be found in the Care Plan section)  Acute Rehab PT Goals Patient Stated Goal: to go home PT Goal Formulation: With  patient Time For Goal Achievement: 06/09/21 Potential to Achieve Goals: Fair    Frequency Min 3X/week   Barriers to discharge        Co-evaluation               AM-PAC PT "6 Clicks" Mobility  Outcome Measure Help needed turning from your back to your side while in a flat bed without using bedrails?: None Help needed moving from lying on your back to sitting on the side of a flat bed without using bedrails?: A Little Help needed moving to and from a bed to a chair (including a wheelchair)?: A Little Help needed standing up from a chair using your arms (e.g., wheelchair or bedside chair)?: A Little Help needed to walk in hospital room?: A Little Help needed climbing 3-5 steps with a railing? : A Little 6 Click Score: 19    End of Session   Activity Tolerance: Patient tolerated treatment well Patient left: in bed;with call bell/phone within reach Nurse Communication: Mobility status PT Visit Diagnosis: Other abnormalities of gait and mobility (R26.89);Muscle weakness (generalized) (M62.81);History of falling (Z91.81)    Time: 1515-1540 PT Time Calculation (min) (ACUTE ONLY): 25 min   Charges:   PT Evaluation $PT Eval Moderate Complexity: 1 Mod PT Treatments $Gait Training: 8-22 mins        Zenaida Niece, PT, DPT Acute Rehabilitation Pager: [phone] Office [phone]   Zenaida Niece 05/26/2021, 4:34 PM

## 2021-05-27 DIAGNOSIS — Z711 Person with feared health complaint in whom no diagnosis is made: Secondary | ICD-10-CM

## 2021-05-27 NOTE — TOC Progression Note (Signed)
Transition of Care [facility]) - Progression Note    Patient Details  Name: Jamie Montoya MRN: [id number] Date of Birth: 10/26/1945  Transition [facility]) CM/SW Contact  Blossom Crume, [place], [place] Phone [place] 05/27/2021, 2:25 PM  Clinical Narrative:    CSW consulted to provide patient with resources for private duty care. Patient confirmed that she has a caregiver that assists her 3x per week(Monday, Wednesday, Friday) for 5 hours per day however was looking for additional assistance to fill in when her caregiver is not there. Available out of pocket options discussed, resource list provided.   [address], LCSW Transition of Care [phone]    Expected Discharge Plan: [place] Barriers to Discharge: Continued Medical Work up  Expected Discharge Plan and Services Expected Discharge Plan: [place] In-house Referral: Clinical Social Work Discharge Planning Services: CM Consult Post Acute Care Choice: [place] arrangements for the past 2 months: Single Family Home                                       Social Determinants of Health (SDOH) Interventions    Readmission Risk Interventions No flowsheet data found.

## 2021-05-27 NOTE — Progress Notes (Signed)
Daily Progress Note   Patient Name: Jamie Montoya       Date: 05/27/2021 DOB: 03/08/1946  Age: 75 y.o. MRN#: [id number] Attending Physician: Charlynne Cousins, MD Primary Care Physician: Leonides Sake, MD Admit Date: 05/24/2021  Reason for Consultation/Follow-up: Establishing goals of care  Subjective: Chart review performed. Received report from primary RN - no acute concerns.   Went to visit patient at bedside - no family/visitors present. Patient was lying in bed awake, alert, oriented, and able to participate in conversation. She was eating lunch. No signs or non-verbal gestures of pain or discomfort noted. No respiratory distress, increased work of breathing, or secretions noted. She denies pain or shortness of breath.   Reviewed interval history since last PMT visit on Friday. Discussed thoughts and feelings around pursuing SRS. Reviewed treatment would be to improve seizure control as well as prevent further growth of lesion and possible side effects of treatment - all in context of quality of life. After long discussion, patient states she does not wish to undergo SRS treatment because she is "scared" and does not want to have side effects with her already limited prognosis - she wants to let nature take it's course and "keep going as I am."   Again discussed benefits of hospice care - she is familiar with their services from previous enrollment. She at first was agreeable for discharge with hospice; however, after reviewing she could not also received HHPT, she has opted for discharge home with HHPT with goal to enroll in hospice once HHPT is completed. She is agreeable to outpatient Palliative Care to follow and understands they can easily transition her to hospice when she's ready.  She understands she is at high risk for rehospitalization without discharging with hospice and not undergoing SRS.   Discussed concern for her safety at home, as it is anticipated she will continue to decline. Patient recognizes her need for additional support at home outside her current caregiver (helps 5 hours / day MWF). We discussed supplementing with other private duty caregivers - she understands this will likely be an OOP cost. She interested in getting additional information from [facility].   Again reviewed/discussed code status. Medical recommendation was given for DNR/DNI in light of the patient's current medical condition with education provided - patient was not agreeable to  DNR/DNI and wishes to remain full code as outlined in previous note (attempt resuscitation once, no trach/PEG).   All questions and concerns addressed. Encouraged to call with questions and/or concerns. PMT card provided.   Length of Stay: 3  Current Medications: Scheduled Meds:   acetaminophen  1,000 mg Oral Once   feeding supplement  237 mL Oral BID BM   levothyroxine  200 mcg Oral Q0600    Continuous Infusions:   PRN Meds: acetaminophen **OR** acetaminophen, albuterol, morphine injection, ondansetron **OR** ondansetron (ZOFRAN) IV, oxyCODONE  Physical Exam Vitals and nursing note reviewed.  Constitutional:      General: She is not in acute distress.    Appearance: She is ill-appearing.  Pulmonary:     Effort: No respiratory distress.     Comments: Congested cough Skin:    General: Skin is warm and dry.  Neurological:     Mental Status: She is alert and oriented to person, place, and time.     Motor: Weakness present.  Psychiatric:        Attention and Perception: Attention normal.        Behavior: Behavior is cooperative.        Cognition and Memory: Cognition and memory normal.            Vital Signs: BP (!) 98/35 (BP Location: Left Arm)   Pulse 69   Temp 97.6 F (36.4 C) (Oral)   Resp 20    Ht 5' 4"  (1.626 m)   Wt 117.4 kg   SpO2 93%   BMI 44.43 kg/m  SpO2: SpO2: 93 % O2 Device: O2 Device: Nasal Cannula O2 Flow Rate: O2 Flow Rate (L/min): 2 L/min  Intake/output summary:  Intake/Output Summary (Last 24 hours) at 05/27/2021 1124 Last data filed at 05/27/2021 1055 Gross per 24 hour  Intake 120 ml  Output 750 ml  Net -630 ml   LBM:   Baseline Weight: Weight: 117.9 kg Most recent weight: Weight: 117.4 kg       Palliative Assessment/Data: PPS 50-60%    Flowsheet Rows    Flowsheet Row Most Recent Value  Intake Tab   Referral Department Hospitalist  Unit at Time of Referral ER  Palliative Care Primary Diagnosis Cancer  Date Notified 05/24/21  Palliative Care Type New Palliative care  Reason for referral Clarify Goals of Care  Date of Admission 05/24/21  Date first seen by Palliative Care 05/25/21  # of days Palliative referral response time 1 Day(s)  # of days IP prior to Palliative referral 0  Clinical Assessment   Psychosocial & Spiritual Assessment   Palliative Care Outcomes   Patient/Family meeting held? Yes  Who was at the meeting? patient  Palliative Care Outcomes Clarified goals of care, Counseled regarding hospice, Provided advance care planning, Provided psychosocial or spiritual support, ACP counseling assistance  Patient/Family wishes: Interventions discontinued/not started  Lurline Idol, PEG       Patient Active Problem List   Diagnosis Date Noted   Brain lesion 05/25/2021   Mild protein-calorie malnutrition ([place]) 05/25/2021   Fall 05/24/2021   Chronic kidney disease (CKD), stage IV (severe) ([place]) 05/04/2020   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[place]) 05/04/2020   Anemia due to chronic illness 05/04/2020   Goals of care, counseling/discussion 05/04/2020   Diarrhea of presumed infectious origin    Adnexal mass    Acute respiratory failure with hypoxia ([place]) 11/25/2019   COPD with acute exacerbation ([place]) 11/25/2019   Thrombocytopenia ([place])  11/25/2019   Essential hypertension  11/25/2019   Acute heart failure with normal ejection fraction ([place]) 07/18/2017   CHF exacerbation ([place]) 07/17/2017   Bilateral lower leg cellulitis 12/04/2016   Acute on chronic diastolic heart failure ([place]) 09/18/2016   AKI (acute kidney injury) ([place]) 08/26/2016   Paroxysmal SVT (supraventricular tachycardia) ([place]) 08/19/2016   Chronic diastolic CHF (congestive heart failure) ([place]) 08/13/2016   Hearing loss due to cerumen impaction, left 08/04/2016   Hypokalemia 08/01/2016   Chronic venous stasis dermatitis of both lower extremities 07/31/2016   Acute on chronic respiratory failure with hypoxia ([place]) 07/30/2016   HLD (hyperlipidemia) 07/30/2016   Hypothyroidism 07/30/2016   Anxiety 07/30/2016   History of colonic polyps    Personal history of adenomatous colonic polyps 11/13/2011   GERD with stricture 09/20/2010   Obesity, Class III, BMI 40-49.9 (morbid obesity) ([place]) 09/06/2010   COPD (chronic obstructive pulmonary disease) ([place]) 09/06/2010   Ulcerative colitis, chronic ([place]) 09/06/2010    Palliative Care Assessment & Plan   Patient Profile: 75 y.o. female  with past medical history of chronic diarrhea from ulcerative colitis, chronic diastolic CHF, chronic respiratory failure on 3 L nasal cannula,  COPD, GERD, hyperlipidemia, hypothyroidism, morbid obesity, paroxysmal SVT, inoperable malignant tumor in her abdomen (ovarian cancer), and CKD stage IV presented to the ED on 05/24/21 from home after experiencing a fall with head injury. Head CT revealed left frontal and parietal hematoma without traumatic finding; however, right parietal hyperdensity was seen concerning for hemorrhagic metastasis with edema. Fall was felt likely due to focal LLE seizure. Neurosurgery has seen and because primary is known, they do not feel pathology from brain met is needed and feel it likely can be treated with SRS. SRS is recommended to hopefully improve seizure control  as well as prevent further growth of the lesion, no surgical resection planned given her poor overall health status. Patient was admitted on 05/24/2021 with fall, seizure, brain lesion, mild protein calorie malnutrition.   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 As her prognosis shortens, she will be more open to changing code status  She is not interested in pursuing SRS treatement at this time She is now open to re-enrolling with hospice. Short term goal is discharge home with HHPT. Once, PT is completed, she will transition to hospice [facility] consulted for: outpatient Palliative Care referral and private duty caregiver information (she recognizes she needs additional help at home) PMT will continue to follow peripherally. If there are any imminent needs please call the service directly   Goals of Care and Additional Recommendations: Limitations on Scope of Treatment: Full Scope Treatment, No Artificial Feeding, and No Tracheostomy  Code Status:    Code Status Orders  (From admission, onward)           Start     Ordered   05/25/21 0340  Full code  Continuous        05/25/21 0340           Code Status History     Date Active Date Inactive Code Status Order ID Comments User Context   11/25/2019 0026 12/01/2019 2115 Full Code [id number]  Orene Desanctis, DO ED  07/17/2017 2300 07/21/2017 1657 Full Code [id number]  Elwin Mocha, MD ED   08/19/2016 1955 08/21/2016 1605 Full Code [id number]  Isaiah Serge, NP ED   07/30/2016 2011 08/05/2016 1713 Full Code [id number]  Ivor Costa, MD ED        Prognosis:  Hard to determine exactly - likely <6 months - overall poor in context of advanced age, high grade fallopian tube carcinoma with mets to brain, and multiple other comorbidities   Discharge Planning: Home with Palliative Services  Care plan was discussed with primary RN, Dr. Aileen Fass, patient, [facility]  Thank you for allowing the Palliative Medicine Team to assist in the care of this patient.   Total Time 45 minutes Prolonged Time Billed  no       Greater than 50%  of this time was spent counseling and coordinating care related to the above assessment and plan.  Lin Landsman, NP  Please contact Palliative Medicine Team phone at [phone] for questions and concerns.

## 2021-05-27 NOTE — Progress Notes (Signed)
[facility] PROGRESS NOTE    Progress Note  Jamie Montoya  DDU:[id number] DOB: 1946-04-14 DOA: 05/24/2021 PCP: [place]ides Sake, MD     Brief Narrative:   Jamie Montoya is an 75 y.o. female past medical history significant for chronic diarrhea due to ulcerative colitis, chronic diastolic heart failure, chronic respiratory failure is on 2 L of oxygen morbid obesity, paroxysmal supraventricular tachycardia, stage IV chronic kidney disease and a recent diagnosed on stage high-grade serous fallopian tube carcinoma, she has been followed by hospice in the past but they have released her. Has not been completely treated due to underlying comorbidities comes into the ED for chief complaint of a fall she relates that when she stood up got dizzy fell and hit her head, she was able to get back up and hit her head again, there is reports she not did loss consciousness  In the ED: CT of the head showed an 8 mm focus of hyperdensity in the right parietal lobe with significant surrounding edema suspicious for hemorrhagic metastases, large left frontoparietal scalp hematoma and a smaller hematoma of the left frontal head.  CT of the C-spine showed no acute findings. MRI of the brain showed 7 mm hemorrhagic lesion of the right parietal vertex with vasogenic edema likely due to hemorrhagic metastases, there are also scattered punctuated fossa of hemosiderin deposits  Assessment/Plan:   Fall: She relates she did not lose consciousness. She is basically wheelchair-bound lives alone with a caregiver. She is never discussed her goals of care and her transitions to home. She relates she would like to avoid a hospice home. PT rec Home health PT, awaiting Home Health.  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 She met with PMT she wants to continue full scope of treatment, does not want life-prolonging trach or PEG after  talking with the [facility] care. Discussed the case with her oncologist Dr. Jinny Sanders who relates she has nothing else to offer. Patient has unrealistic expectations, she want to go home..  Chronic respiratory failure on 2 L of oxygen: Seems to be stable on supplemental oxygen.  Chronic kidney disease stage IV: Creatinine appears to be at baseline we will start her on gentle IV fluid hydration recheck a basic metabolic panel in the morning, she relates some nausea and no appetite.  Hypothyroidism: Continue with Synthroid.  Mild protein-calorie malnutrition ([place]) Counseling   DVT prophylaxis: SCD Family Communication:none Status is: Inpatient  Remains inpatient appropriate because: 75 year old with past medical history of chronic respiratory failure, ulcerative colitis with chronic diarrhea chronic disease stage IV with metastatic high-grade fallopian tube carcinoma and a hemorrhagic metastatic brain bleed      Code Status:     Code Status Orders  (From admission, onward)           Start     Ordered   05/25/21 0340  Full code  Continuous        05/25/21 0340           Code Status History     Date Active Date Inactive Code Status Order ID Comments User Context   11/25/2019 0026 12/01/2019 2115 Full Code [id number]  Orene Desanctis, DO ED   07/17/2017 2300 07/21/2017 1657 Full Code [id number]  [place] Mocha, MD ED   08/19/2016 1955 08/21/2016 1605 Full Code [id number]  Isaiah Serge, NP ED   07/30/2016 2011 08/05/2016 1713 Full Code [id number]  Blaine Hamper,  Soledad Gerlach, MD ED         IV Access:   Peripheral IV   Procedures and diagnostic studies:   No results found.   Medical Consultants:   None.   Subjective:    Jamie Montoya no complains  Objective:    Vitals:   05/26/21 2300 05/27/21 0400 05/27/21 0500 05/27/21 0814  BP: (!) 116/49   (!) 98/35  Pulse: 72   69  Resp: (!) 22 (!) 24  20  Temp: 98.2 F (36.8 C) 98 F (36.7 C)  97.6 F (36.4 C)  TempSrc: Oral  Oral  Oral  SpO2: 98% 93%    Weight:   117.4 kg   Height:       SpO2: 93 % O2 Flow Rate (L/min): 2 L/min   Intake/Output Summary (Last 24 hours) at 05/27/2021 0951 Last data filed at 05/26/2021 1608 Gross per 24 hour  Intake 120 ml  Output 200 ml  Net -80 ml    Filed Weights   05/24/21 1257 05/27/21 0500  Weight: 117.9 kg 117.4 kg    Exam: General exam: In no acute distress. Respiratory system: Good air movement and clear to auscultation. Cardiovascular system: S1 & S2 heard, RRR. No JVD. Gastrointestinal system: Abdomen is nondistended, soft and nontender.  Extremities: No pedal edema. Skin: No rashes, lesions or ulcers  Data Reviewed:    Labs: Basic Metabolic Panel: Recent Labs  Lab 05/24/21 1523 05/25/21 0602  NA 141 139  K 4.8 4.5  CL 101 102  CO2 31 32  GLUCOSE 80 117*  BUN 58* 57*  CREATININE 2.10* 2.14*  CALCIUM 9.0 8.8*  MG  --  1.9    GFR Estimated Creatinine Clearance: 27.7 mL/min (A) (by C-G formula based on SCr of 2.14 mg/dL (H)). Liver Function Tests: Recent Labs  Lab 05/24/21 1523 05/25/21 0602  AST 18 17  ALT 19 17  ALKPHOS 135* 130*  BILITOT 0.8 0.5  PROT 7.3 6.7  ALBUMIN 3.4* 3.0*    No results for input(s): LIPASE, AMYLASE in the last 168 hours. No results for input(s): AMMONIA in the last 168 hours. Coagulation profile No results for input(s): INR, PROTIME in the last 168 hours. COVID-19 Labs  No results for input(s): DDIMER, FERRITIN, LDH, CRP in the last 72 hours.  Lab Results  Component Value Date   SARSCOV2NAA NEGATIVE 05/24/2021   [place] NEGATIVE 11/24/2019    CBC: Recent Labs  Lab 05/24/21 1523 05/25/21 0602  WBC 5.7 4.0  NEUTROABS 4.6 2.6  HGB 8.8* 8.5*  HCT 29.5* 28.6*  MCV 103.5* 103.6*  PLT PLATELET CLUMPS NOTED ON SMEAR, UNABLE TO ESTIMATE 100*    Cardiac Enzymes: No results for input(s): CKTOTAL, CKMB, CKMBINDEX, TROPONINI in the last 168 hours. BNP (last 3 results) No results for  input(s): PROBNP in the last 8760 hours. CBG: No results for input(s): GLUCAP in the last 168 hours. D-Dimer: No results for input(s): DDIMER in the last 72 hours. Hgb A1c: No results for input(s): HGBA1C in the last 72 hours. Lipid Profile: No results for input(s): CHOL, HDL, LDLCALC, TRIG, CHOLHDL, LDLDIRECT in the last 72 hours. Thyroid function studies: No results for input(s): TSH, T4TOTAL, T3FREE, THYROIDAB in the last 72 hours.  Invalid input(s): FREET3 Anemia work up: No results for input(s): VITAMINB12, FOLATE, FERRITIN, TIBC, IRON, RETICCTPCT in the last 72 hours. Sepsis Labs: Recent Labs  Lab 05/24/21 1523 05/25/21 0602  WBC 5.7 4.0    Microbiology Recent Results (from the  past 240 hour(s))  Resp Panel by RT-PCR (Flu A&B, Covid) Nasopharyngeal Swab     Status: None   Collection Time: 05/24/21  6:17 PM   Specimen: Nasopharyngeal Swab; Nasopharyngeal(NP) swabs in vial transport medium  Result Value Ref Range Status   SARS Coronavirus 2 by RT PCR NEGATIVE NEGATIVE Final    Comment: (NOTE) SARS-CoV-2 target nucleic acids are NOT DETECTED.  The SARS-CoV-2 RNA is generally detectable in upper respiratory specimens during the acute phase of infection. The lowest concentration of SARS-CoV-2 viral copies this assay can detect is 138 copies/mL. A negative result does not preclude SARS-Cov-2 infection and should not be used as the sole basis for treatment or other patient management decisions. A negative result may occur with  improper specimen collection/handling, submission of specimen other than nasopharyngeal swab, presence of viral mutation(s) within the areas targeted by this assay, and inadequate number of viral copies(<138 copies/mL). A negative result must be combined with clinical observations, patient history, and epidemiological information. The expected result is Negative.  Fact Sheet for Patients:  EntrepreneurPulse.com.au  Fact Sheet  for Healthcare Providers:  IncredibleEmployment.be  This test is no t yet approved or cleared by the [place] FDA and  has been authorized for detection and/or diagnosis of SARS-CoV-2 by FDA under an Emergency Use Authorization (EUA). This EUA will remain  in effect (meaning this test can be used) for the duration of the COVID-19 declaration under Section 564(b)(1) of the Act, 21 U.S.C.section 360bbb-3(b)(1), unless the authorization is terminated  or revoked sooner.       Influenza A by PCR NEGATIVE NEGATIVE Final   Influenza B by PCR NEGATIVE NEGATIVE Final    Comment: (NOTE) The Xpert Xpress SARS-CoV-2/FLU/RSV plus assay is intended as an aid in the diagnosis of influenza from Nasopharyngeal swab specimens and should not be used as a sole basis for treatment. Nasal washings and aspirates are unacceptable for Xpert Xpress SARS-CoV-2/FLU/RSV testing.  Fact Sheet for Patients: EntrepreneurPulse.com.au  Fact Sheet for Healthcare Providers: IncredibleEmployment.be  This test is not yet approved or cleared by the [place] FDA and has been authorized for detection and/or diagnosis of SARS-CoV-2 by FDA under an Emergency Use Authorization (EUA). This EUA will remain in effect (meaning this test can be used) for the duration of the COVID-19 declaration under Section 564(b)(1) of the Act, 21 U.S.C. section 360bbb-3(b)(1), unless the authorization is terminated or revoked.  Performed at [facility], [place] [address], [place], [place] [address]      Medications:    acetaminophen  1,000 mg Oral Once   feeding supplement  237 mL Oral BID BM   levothyroxine  200 mcg Oral Q0600   Continuous Infusions:    LOS: 3 days   Charlynne Cousins  [facility]  05/27/2021, 9:51 AM

## 2021-05-28 NOTE — TOC Transition Note (Addendum)
Transition of Care [facility]) - CM/SW Discharge Note   Patient Details  Name: Jamie Montoya MRN: [id number] Date of Birth: March 17, 1946  [facility]) CM/SW Contact:  Cyndi Bender, RN Phone Number: 05/28/2021, 11:33 AM   Clinical Narrative:    Patient stable for discharge. Orders for Home Health-PT/OT/aide. Spoke to patient regarding transition needs.Choice offered with list provided per CMS guidelines from medicare.gov website with star ratings (copy placed in shadow chart).  Patient deferred to [facility] to choose [facility] agency. Spoke to [place] with [place] and referral accepted.  Patient has all needed DME and has home 02 3L with [employer] Daughter will transport patient home. Patient has caregiver that will be available to take to appointments.  Referred patient to [place] for outpatient palliative.  Address, Phone number and PCP verified.   Final next level of care: [place] Barriers to Discharge: Barriers Resolved   Patient Goals and CMS Choice Patient states their goals for this hospitalization and ongoing recovery are:: return home CMS Medicare.gov Compare Post Acute Care list provided to:: Patient Choice offered to / list presented to : Patient  Discharge Placement                 Home      Discharge Plan and Services In-house Referral: Clinical Social Work Discharge Planning Services: CM Consult Post Acute Care Choice: Home Health                    HH Arranged: PT, OT, Nurse's Aide [place] Agency: [place] Date [place]: 05/28/21 Time [place]: 1132 Representative spoke with at [place]: [place] ([place]) Interventions     Readmission Risk Interventions No flowsheet data found.

## 2021-05-28 NOTE — Progress Notes (Signed)
[facility] ([facility])  Hospital Liaison RN note         Notified by TOC manager of patient/family request for [facility] Palliative services at home after discharge.              [facility] Palliative team will follow up with patient after discharge.         Please call with any hospice or palliative related questions.         Thank you for the opportunity to participate in this patient's care.     Chrislyn King, BSN, RN [facility] Hospital Liaison (listed on [facility] under Hospice/[facility])    [phone] (24h on call)    

## 2021-05-28 NOTE — Care Management Important Message (Signed)
Important Message  Patient Details  Name: Jamie Montoya MRN: [id number] Date of Birth: [birth year]   Medicare Important Message Given:  Yes     [name] 05/28/2021, 4:31 PM

## 2021-05-28 NOTE — Discharge Summary (Signed)
Physician Discharge Summary  Jamie Montoya DZH:[id number] DOB: 26-Apr-1946 DOA: 05/24/2021  PCP: Leonides Sake, MD  Admit date: 05/24/2021 Discharge date: 05/28/2021  Admitted From: home Disposition:  Home  Recommendations for Outpatient Follow-up:  Follow up with PCP in 1-2 weeks Please obtain BMP/CBC in one week Hospice and Perative care to follow-up at home  Home Health:Yes Equipment/Devices:None  Discharge Condition:Stable CODE STATUS:Full Diet recommendation: Heart Healthy   Brief/Interim Summary: 75 y.o. female past medical history significant for chronic diarrhea due to ulcerative colitis, chronic diastolic heart failure, chronic respiratory failure is on 2 L of oxygen morbid obesity, paroxysmal supraventricular tachycardia, stage IV chronic kidney disease and a recent diagnosed on stage high-grade serous fallopian tube carcinoma, she has been followed by hospice in the past but they have released her. Has not been completely treated due to underlying comorbidities comes into the ED for chief complaint of a fall she relates that when she stood up got dizzy fell and hit her head, she was able to get back up and hit her head again, there is reports she not did loss consciousness.  CT of the head showed a 8 mm focus hyperdensity of the right parietal lobe with significant surrounding edema suspicious for hemorrhagic metastases, large frontal parietal scalp hematoma, CT of the C-spine showed no acute findings. MRI of the brain showed a 7 mm hemorrhagic lesion of the right parietal vertex with vasogenic edema.  Discharge Diagnoses:  Principal Problem:   Fall Active Problems:   Hypothyroidism   Brain lesion   Mild protein-calorie malnutrition ([place])  Mechanical fall: She relates she did not lose consciousness, there was no loss of control sphincters, no biting of the tongue she does recollect the episode.  Neurosurgery She is wheelchair-bound lives alone with a caregiver. According  to the patient she is never had discussion about goals of care I spoke with the oncologist who relates she is not a candidate for chemotherapy or any further interventions. Physical therapy evaluated the patient and recommended home health PT. Palliative care was consulted and the patient agreed to have hospice follow her at home after she completes her physical therapy.  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 Neurosurgery was consulted who recommended no seizure medications at this time, the case was discussed with neurology that due to the lack of documented seizures they would not start AEDs. She was monitored for 48 hours without any seizure events.  Chronic respiratory failure with hypoxia on 2 L of oxygen at home: Seems to be stable.  Chronic kidney disease stage IV: Her creatinine appears to be at baseline no changes made.  Hypothyroidism: Continue Synthroid.  Mild protein caloric malnutrition: Counseling.  Discharge Instructions  Discharge Instructions     Diet - low sodium heart healthy   Complete by: As directed    Increase activity slowly   Complete by: As directed       Allergies as of 05/28/2021       Reactions   Tape Rash        Medication List     STOP taking these medications    nystatin powder Commonly known as: MYCOSTATIN/NYSTOP       TAKE these medications    albuterol (2.5 MG/3ML) 0.083% nebulizer solution Commonly known as: PROVENTIL Take 3 mLs by nebulization 4 (four) times daily as  needed for wheezing or shortness of breath.   albuterol 108 (90 Base) MCG/ACT inhaler Commonly known as: VENTOLIN HFA Inhale 2 puffs into the lungs every 6 (six) hours as needed for wheezing or shortness of breath.   Biotin 5000 MCG Tabs Take 1 tablet by mouth in the morning.    levothyroxine 175 MCG tablet Commonly known as: SYNTHROID Take 175 mcg by mouth daily.   torsemide 20 MG tablet Commonly known as: DEMADEX Take 1 tablet (20 mg total) by mouth every morning. What changed: when to take this        Allergies  Allergen Reactions   Tape Rash    Consultations: Neurosurgery   Procedures/Studies: CT Head Wo Contrast  Result Date: 05/24/2021 CLINICAL DATA:  Fall, trauma EXAM: CT HEAD WITHOUT CONTRAST CT MAXILLOFACIAL WITHOUT CONTRAST TECHNIQUE: Multidetector CT imaging of the head and maxillofacial structures were performed using the standard protocol without intravenous contrast. Multiplanar CT image reconstructions of the maxillofacial structures were also generated. COMPARISON:  None. FINDINGS: CT HEAD FINDINGS Brain: There is a 0.8 cm focus of hyperdensity in the right parietal lobe with significant surrounding vasogenic edema. The appearance is not typical for a hemorrhagic contusion. Otherwise, there is no evidence of acute intracranial hemorrhage or extra-axial fluid collection. Parenchymal volume is normal. The ventricles are normal in size. There is no midline shift. Vascular: There is calcification of the bilateral cavernous ICAs. Skull: There is a large left frontoparietal scalp hematoma. There is a smaller hematoma over the left forehead. There is no underlying calvarial fracture. Other: There is a small amount of fluid in the right mastoid air cells. No obstructing lesion is seen in the right nasopharynx. CT MAXILLOFACIAL FINDINGS Osseous: No acute facial bone fracture is identified. There is no evidence of mandibular dislocation. There is no suspicious osseous lesion. Orbits: Bilateral lens implants are in place. The globes and orbits are otherwise unremarkable. Sinuses: There is mild mucosal thickening in the left maxillary sinus. Soft tissues: Unremarkable. IMPRESSION: 1. 8 mm focus of hyperdensity in the right parietal lobe with significant  surrounding edema is suspicious for a hemorrhagic metastasis. Recommend brain MRI with and without contrast for further evaluation. 2. Large left frontoparietal scalp hematoma and smaller hematoma in the left forehead without underlying calvarial fracture. 3. No facial bone fracture. Electronically Signed   By: Valetta Mole M.D.   On: 05/24/2021 13:49   CT Cervical Spine Wo Contrast  Result Date: 05/24/2021 CLINICAL DATA:  Head trauma EXAM: CT CERVICAL SPINE WITHOUT CONTRAST TECHNIQUE: Multidetector CT imaging of the cervical spine was performed without intravenous contrast. Multiplanar CT image reconstructions were also generated. COMPARISON:  None. FINDINGS: Alignment: There is no antero or retrolisthesis. There is no jumped or perched facets or other evidence of traumatic malalignment. Skull base and vertebrae: Skull base alignment is maintained. Vertebral body heights are preserved. There is no evidence of acute fracture. Soft tissues and spinal canal: No prevertebral fluid or swelling. No visible canal hematoma. Disc levels: There is intervertebral disc space narrowing with mild endplate irregularity at C5-C6 through C7-T1. The osseous spinal canal and neural foramina are patent. Upper chest: The lung apices are clear. Other: None. IMPRESSION: No acute fracture or traumatic malalignment of the cervical spine. Electronically Signed   By: Valetta Mole M.D.   On: 05/24/2021 13:47   MR BRAIN WO CONTRAST  Result Date: 05/24/2021 CLINICAL DATA:  Fall with trauma to the head. Right parietal hemorrhage with surrounding edema of felt suspicious for a  metastasis by CT. The patient would not allow postcontrast imaging. EXAM: MRI HEAD WITHOUT CONTRAST TECHNIQUE: Multiplanar, multiecho pulse sequences of the brain and surrounding structures were obtained without intravenous contrast. COMPARISON:  Head CT same day. FINDINGS: Brain: Diffusion imaging does not show any acute or subacute infarction. No focal abnormality  affects the brainstem, cerebellum or left cerebral hemisphere. At the right parietal vertex, there is a 9-10 mm lesion with internal hemorrhage and surrounding vasogenic edema. No second lesion is identified on this noncontrast exam. The patient does also have a few scattered punctate foci of hemosiderin deposition elsewhere including in the cerebellum, and in a few foci of the cerebral hemispheric white matter on both sides. Most likely diagnosis of the main lesion is hemorrhagic metastasis. Primary brain tumor is less likely. Inflammatory lesions such as neurocysticercosis could be considered. Postcontrast imaging suggested when the patient is able. No hydrocephalus.  No extra-axial collection. Vascular: Major vessels at the base of the brain show flow. Skull and upper cervical spine: Negative Sinuses/Orbits: Clear/normal Other: Left-sided scalp hematomas. IMPRESSION: 9-10 mm hemorrhagic lesion at the right parietal vertex with surrounding vasogenic edema. Most likely diagnosis would be hemorrhagic metastatic lesion. Primary brain tumor is less likely. Inflammatory focus such as neurocysticercosis could be considered. The patient would not tolerate postcontrast imaging. That would be suggested when the patient is able. There are a few other scattered punctate foci of susceptibility artifact/hemosiderin deposition scattered elsewhere throughout the brain including within the cerebellum and both cerebral hemispheres. These could be better evaluated with repeat imaging as well. These do not appear to be associated with any discernible edema. The could be chronic microhemorrhages related to hypertension, could relate to the current or previous trauma or could conceivably represent additional hemorrhagic tiny lesions. Electronically Signed   By: Nelson Chimes M.D.   On: 05/24/2021 17:29   CT Maxillofacial WO CM  Result Date: 05/24/2021 CLINICAL DATA:  Fall, trauma EXAM: CT HEAD WITHOUT CONTRAST CT MAXILLOFACIAL  WITHOUT CONTRAST TECHNIQUE: Multidetector CT imaging of the head and maxillofacial structures were performed using the standard protocol without intravenous contrast. Multiplanar CT image reconstructions of the maxillofacial structures were also generated. COMPARISON:  None. FINDINGS: CT HEAD FINDINGS Brain: There is a 0.8 cm focus of hyperdensity in the right parietal lobe with significant surrounding vasogenic edema. The appearance is not typical for a hemorrhagic contusion. Otherwise, there is no evidence of acute intracranial hemorrhage or extra-axial fluid collection. Parenchymal volume is normal. The ventricles are normal in size. There is no midline shift. Vascular: There is calcification of the bilateral cavernous ICAs. Skull: There is a large left frontoparietal scalp hematoma. There is a smaller hematoma over the left forehead. There is no underlying calvarial fracture. Other: There is a small amount of fluid in the right mastoid air cells. No obstructing lesion is seen in the right nasopharynx. CT MAXILLOFACIAL FINDINGS Osseous: No acute facial bone fracture is identified. There is no evidence of mandibular dislocation. There is no suspicious osseous lesion. Orbits: Bilateral lens implants are in place. The globes and orbits are otherwise unremarkable. Sinuses: There is mild mucosal thickening in the left maxillary sinus. Soft tissues: Unremarkable. IMPRESSION: 1. 8 mm focus of hyperdensity in the right parietal lobe with significant surrounding edema is suspicious for a hemorrhagic metastasis. Recommend brain MRI with and without contrast for further evaluation. 2. Large left frontoparietal scalp hematoma and smaller hematoma in the left forehead without underlying calvarial fracture. 3. No facial bone fracture. Electronically Signed  By: Valetta Mole M.D.   On: 05/24/2021 13:49   (Echo, Carotid, EGD, Colonoscopy, ERCP)    Subjective: No complaints  Discharge Exam: Vitals:   05/28/21 0318  05/28/21 0753  BP: (!) 107/48 (!) 116/41  Pulse: 73 83  Resp: 20 19  Temp: 97.6 F (36.4 C) 97.7 F (36.5 C)  SpO2: 94% 95%   Vitals:   05/27/21 2350 05/28/21 0318 05/28/21 0319 05/28/21 0753  BP: (!) 114/52 (!) 107/48  (!) 116/41  Pulse:  73  83  Resp: 20 20  19   Temp: (!) 97.3 F (36.3 C) 97.6 F (36.4 C)  97.7 F (36.5 C)  TempSrc: Oral Axillary  Oral  SpO2: 95% 94%  95%  Weight:   116.6 kg   Height:        General: Pt is alert, awake, not in acute distress Cardiovascular: RRR, S1/S2 +, no rubs, no gallops Respiratory: CTA bilaterally, no wheezing, no rhonchi Abdominal: Soft, NT, ND, bowel sounds + Extremities: no edema, no cyanosis    The results of significant diagnostics from this hospitalization (including imaging, microbiology, ancillary and laboratory) are listed below for reference.     Microbiology: Recent Results (from the past 240 hour(s))  Resp Panel by RT-PCR (Flu A&B, Covid) Nasopharyngeal Swab     Status: None   Collection Time: 05/24/21  6:17 PM   Specimen: Nasopharyngeal Swab; Nasopharyngeal(NP) swabs in vial transport medium  Result Value Ref Range Status   SARS Coronavirus 2 by RT PCR NEGATIVE NEGATIVE Final    Comment: (NOTE) SARS-CoV-2 target nucleic acids are NOT DETECTED.  The SARS-CoV-2 RNA is generally detectable in upper respiratory specimens during the acute phase of infection. The lowest concentration of SARS-CoV-2 viral copies this assay can detect is 138 copies/mL. A negative result does not preclude SARS-Cov-2 infection and should not be used as the sole basis for treatment or other patient management decisions. A negative result may occur with  improper specimen collection/handling, submission of specimen other than nasopharyngeal swab, presence of viral mutation(s) within the areas targeted by this assay, and inadequate number of viral copies(<138 copies/mL). A negative result must be combined with clinical observations,  patient history, and epidemiological information. The expected result is Negative.  Fact Sheet for Patients:  EntrepreneurPulse.com.au  Fact Sheet for Healthcare Providers:  IncredibleEmployment.be  This test is no t yet approved or cleared by the [place] FDA and  has been authorized for detection and/or diagnosis of SARS-CoV-2 by FDA under an Emergency Use Authorization (EUA). This EUA will remain  in effect (meaning this test can be used) for the duration of the COVID-19 declaration under Section 564(b)(1) of the Act, 21 U.S.C.section 360bbb-3(b)(1), unless the authorization is terminated  or revoked sooner.       Influenza A by PCR NEGATIVE NEGATIVE Final   Influenza B by PCR NEGATIVE NEGATIVE Final    Comment: (NOTE) The Xpert Xpress SARS-CoV-2/FLU/RSV plus assay is intended as an aid in the diagnosis of influenza from Nasopharyngeal swab specimens and should not be used as a sole basis for treatment. Nasal washings and aspirates are unacceptable for Xpert Xpress SARS-CoV-2/FLU/RSV testing.  Fact Sheet for Patients: EntrepreneurPulse.com.au  Fact Sheet for Healthcare Providers: IncredibleEmployment.be  This test is not yet approved or cleared by the [place] FDA and has been authorized for detection and/or diagnosis of SARS-CoV-2 by FDA under an Emergency Use Authorization (EUA). This EUA will remain in effect (meaning this test can be used) for the duration  of the COVID-19 declaration under Section 564(b)(1) of the Act, 21 U.S.C. section 360bbb-3(b)(1), unless the authorization is terminated or revoked.  Performed at [facility], [place] [address] [place], [place] [address]      Labs: BNP (last 3 results) No results for input(s): BNP in the last 8760 hours. Basic Metabolic Panel: Recent Labs  Lab 05/24/21 1523 05/25/21 0602  NA 141 139  K 4.8 4.5  CL 101 102  CO2 31 32   GLUCOSE 80 117*  BUN 58* 57*  CREATININE 2.10* 2.14*  CALCIUM 9.0 8.8*  MG  --  1.9   Liver Function Tests: Recent Labs  Lab 05/24/21 1523 05/25/21 0602  AST 18 17  ALT 19 17  ALKPHOS 135* 130*  BILITOT 0.8 0.5  PROT 7.3 6.7  ALBUMIN 3.4* 3.0*   No results for input(s): LIPASE, AMYLASE in the last 168 hours. No results for input(s): AMMONIA in the last 168 hours. CBC: Recent Labs  Lab 05/24/21 1523 05/25/21 0602  WBC 5.7 4.0  NEUTROABS 4.6 2.6  HGB 8.8* 8.5*  HCT 29.5* 28.6*  MCV 103.5* 103.6*  PLT PLATELET CLUMPS NOTED ON SMEAR, UNABLE TO ESTIMATE 100*   Cardiac Enzymes: No results for input(s): CKTOTAL, CKMB, CKMBINDEX, TROPONINI in the last 168 hours. BNP: Invalid input(s): POCBNP CBG: No results for input(s): GLUCAP in the last 168 hours. D-Dimer No results for input(s): DDIMER in the last 72 hours. Hgb A1c No results for input(s): HGBA1C in the last 72 hours. Lipid Profile No results for input(s): CHOL, HDL, LDLCALC, TRIG, CHOLHDL, LDLDIRECT in the last 72 hours. Thyroid function studies No results for input(s): TSH, T4TOTAL, T3FREE, THYROIDAB in the last 72 hours.  Invalid input(s): FREET3 Anemia work up No results for input(s): VITAMINB12, FOLATE, FERRITIN, TIBC, IRON, RETICCTPCT in the last 72 hours. Urinalysis    Component Value Date/Time   COLORURINE YELLOW 05/25/2021 0826   APPEARANCEUR CLOUDY (A) 05/25/2021 0826   LABSPEC 1.010 05/25/2021 0826   PHURINE 6.0 05/25/2021 0826   GLUCOSEU NEGATIVE 05/25/2021 0826   HGBUR TRACE (A) 05/25/2021 0826   BILIRUBINUR NEGATIVE 05/25/2021 0826   KETONESUR NEGATIVE 05/25/2021 0826   PROTEINUR NEGATIVE 05/25/2021 0826   NITRITE POSITIVE (A) 05/25/2021 0826   LEUKOCYTESUR LARGE (A) 05/25/2021 0826   Sepsis Labs Invalid input(s): PROCALCITONIN,  WBC,  LACTICIDVEN Microbiology Recent Results (from the past 240 hour(s))  Resp Panel by RT-PCR (Flu A&B, Covid) Nasopharyngeal Swab     Status: None    Collection Time: 05/24/21  6:17 PM   Specimen: Nasopharyngeal Swab; Nasopharyngeal(NP) swabs in vial transport medium  Result Value Ref Range Status   SARS Coronavirus 2 by RT PCR NEGATIVE NEGATIVE Final    Comment: (NOTE) SARS-CoV-2 target nucleic acids are NOT DETECTED.  The SARS-CoV-2 RNA is generally detectable in upper respiratory specimens during the acute phase of infection. The lowest concentration of SARS-CoV-2 viral copies this assay can detect is 138 copies/mL. A negative result does not preclude SARS-Cov-2 infection and should not be used as the sole basis for treatment or other patient management decisions. A negative result may occur with  improper specimen collection/handling, submission of specimen other than nasopharyngeal swab, presence of viral mutation(s) within the areas targeted by this assay, and inadequate number of viral copies(<138 copies/mL). A negative result must be combined with clinical observations, patient history, and epidemiological information. The expected result is Negative.  Fact Sheet for Patients:  EntrepreneurPulse.com.au  Fact Sheet for Healthcare Providers:  IncredibleEmployment.be  This test is no t yet approved or cleared by the [place] and  has been authorized for detection and/or diagnosis of SARS-CoV-2 by FDA under an Emergency Use Authorization (EUA). This EUA will remain  in effect (meaning this test can be used) for the duration of the COVID-19 declaration under Section 564(b)(1) of the Act, 21 U.S.C.section 360bbb-3(b)(1), unless the authorization is terminated  or revoked sooner.       Influenza A by PCR NEGATIVE NEGATIVE Final   Influenza B by PCR NEGATIVE NEGATIVE Final    Comment: (NOTE) The Xpert Xpress SARS-CoV-2/FLU/RSV plus assay is intended as an aid in the diagnosis of influenza from Nasopharyngeal swab specimens and should not be used as a sole basis for treatment.  Nasal washings and aspirates are unacceptable for Xpert Xpress SARS-CoV-2/FLU/RSV testing.  Fact Sheet for Patients: EntrepreneurPulse.com.au  Fact Sheet for Healthcare Providers: IncredibleEmployment.be  This test is not yet approved or cleared by the [place] FDA and has been authorized for detection and/or diagnosis of SARS-CoV-2 by FDA under an Emergency Use Authorization (EUA). This EUA will remain in effect (meaning this test can be used) for the duration of the COVID-19 declaration under Section 564(b)(1) of the Act, 21 U.S.C. section 360bbb-3(b)(1), unless the authorization is terminated or revoked.  Performed at [facility], [place] [address] [place], [place] [address]     SIGNED:   Charlynne Cousins, MD  [facility] 05/28/2021, 10:45 AM Pager   If 7PM-7AM, please contact night-coverage [email or web address] Password [email or web address]

## 2021-05-29 ENCOUNTER — Other Ambulatory Visit: Payer: Self-pay | Admitting: Radiation Therapy

## 2021-06-02 DIAGNOSIS — I5032 Chronic diastolic (congestive) heart failure: Secondary | ICD-10-CM | POA: Diagnosis not present

## 2021-06-02 DIAGNOSIS — Z87891 Personal history of nicotine dependence: Secondary | ICD-10-CM | POA: Diagnosis not present

## 2021-06-02 DIAGNOSIS — C7931 Secondary malignant neoplasm of brain: Secondary | ICD-10-CM | POA: Diagnosis not present

## 2021-06-02 DIAGNOSIS — E440 Moderate protein-calorie malnutrition: Secondary | ICD-10-CM | POA: Diagnosis not present

## 2021-06-02 DIAGNOSIS — I4510 Unspecified right bundle-branch block: Secondary | ICD-10-CM | POA: Diagnosis not present

## 2021-06-02 DIAGNOSIS — Z9181 History of falling: Secondary | ICD-10-CM | POA: Diagnosis not present

## 2021-06-02 DIAGNOSIS — N184 Chronic kidney disease, stage 4 (severe): Secondary | ICD-10-CM | POA: Diagnosis not present

## 2021-06-02 DIAGNOSIS — D630 Anemia in neoplastic disease: Secondary | ICD-10-CM | POA: Diagnosis not present

## 2021-06-02 DIAGNOSIS — J9611 Chronic respiratory failure with hypoxia: Secondary | ICD-10-CM | POA: Diagnosis not present

## 2021-06-02 DIAGNOSIS — D631 Anemia in chronic kidney disease: Secondary | ICD-10-CM | POA: Diagnosis not present

## 2021-06-02 DIAGNOSIS — J449 Chronic obstructive pulmonary disease, unspecified: Secondary | ICD-10-CM | POA: Diagnosis not present

## 2021-06-02 DIAGNOSIS — Z9981 Dependence on supplemental oxygen: Secondary | ICD-10-CM | POA: Diagnosis not present

## 2021-06-02 DIAGNOSIS — I471 Supraventricular tachycardia: Secondary | ICD-10-CM | POA: Diagnosis not present

## 2021-06-02 DIAGNOSIS — I130 Hypertensive heart and chronic kidney disease with heart failure and stage 1 through stage 4 chronic kidney disease, or unspecified chronic kidney disease: Secondary | ICD-10-CM | POA: Diagnosis not present

## 2021-06-02 DIAGNOSIS — K219 Gastro-esophageal reflux disease without esophagitis: Secondary | ICD-10-CM | POA: Diagnosis not present

## 2021-06-02 DIAGNOSIS — E785 Hyperlipidemia, unspecified: Secondary | ICD-10-CM | POA: Diagnosis not present

## 2021-06-02 DIAGNOSIS — E039 Hypothyroidism, unspecified: Secondary | ICD-10-CM | POA: Diagnosis not present

## 2021-06-04 ENCOUNTER — Other Ambulatory Visit: Payer: Self-pay

## 2021-06-04 ENCOUNTER — Encounter (HOSPITAL_COMMUNITY): Payer: Self-pay

## 2021-06-04 ENCOUNTER — Emergency Department (HOSPITAL_COMMUNITY): Payer: Medicare Other

## 2021-06-04 ENCOUNTER — Emergency Department (HOSPITAL_COMMUNITY)
Admission: EM | Admit: 2021-06-04 | Discharge: 2021-06-04 | Disposition: A | Discharge status: Home / Self Care | Payer: Medicare Other | Attending: Emergency Medicine | Admitting: Emergency Medicine

## 2021-06-04 ENCOUNTER — Inpatient Hospital Stay: Payer: Medicare Other | Attending: Neurological Surgery

## 2021-06-04 DIAGNOSIS — E039 Hypothyroidism, unspecified: Secondary | ICD-10-CM | POA: Diagnosis not present

## 2021-06-04 DIAGNOSIS — Z8542 Personal history of malignant neoplasm of other parts of uterus: Secondary | ICD-10-CM | POA: Insufficient documentation

## 2021-06-04 DIAGNOSIS — N184 Chronic kidney disease, stage 4 (severe): Secondary | ICD-10-CM | POA: Insufficient documentation

## 2021-06-04 DIAGNOSIS — S0003XA Contusion of scalp, initial encounter: Secondary | ICD-10-CM | POA: Diagnosis not present

## 2021-06-04 DIAGNOSIS — J449 Chronic obstructive pulmonary disease, unspecified: Secondary | ICD-10-CM | POA: Diagnosis not present

## 2021-06-04 DIAGNOSIS — Z87891 Personal history of nicotine dependence: Secondary | ICD-10-CM | POA: Diagnosis not present

## 2021-06-04 DIAGNOSIS — I5032 Chronic diastolic (congestive) heart failure: Secondary | ICD-10-CM | POA: Diagnosis not present

## 2021-06-04 DIAGNOSIS — I130 Hypertensive heart and chronic kidney disease with heart failure and stage 1 through stage 4 chronic kidney disease, or unspecified chronic kidney disease: Secondary | ICD-10-CM | POA: Diagnosis not present

## 2021-06-04 DIAGNOSIS — E1122 Type 2 diabetes mellitus with diabetic chronic kidney disease: Secondary | ICD-10-CM | POA: Insufficient documentation

## 2021-06-04 DIAGNOSIS — Z743 Need for continuous supervision: Secondary | ICD-10-CM | POA: Diagnosis not present

## 2021-06-04 DIAGNOSIS — I10 Essential (primary) hypertension: Secondary | ICD-10-CM | POA: Diagnosis not present

## 2021-06-04 DIAGNOSIS — R069 Unspecified abnormalities of breathing: Secondary | ICD-10-CM | POA: Diagnosis not present

## 2021-06-04 DIAGNOSIS — I4510 Unspecified right bundle-branch block: Secondary | ICD-10-CM | POA: Diagnosis not present

## 2021-06-04 DIAGNOSIS — R569 Unspecified convulsions: Secondary | ICD-10-CM

## 2021-06-04 DIAGNOSIS — R410 Disorientation, unspecified: Secondary | ICD-10-CM | POA: Diagnosis not present

## 2021-06-04 DIAGNOSIS — R6889 Other general symptoms and signs: Secondary | ICD-10-CM | POA: Diagnosis not present

## 2021-06-04 HISTORY — DX: Malignant (primary) neoplasm, unspecified: C80.1

## 2021-06-04 LAB — CBC WITH DIFFERENTIAL/PLATELET
Abs Immature Granulocytes: 0.02 10*3/uL (ref 0.00–0.07)
Basophils Absolute: 0 10*3/uL (ref 0.0–0.1)
Basophils Relative: 1 %
Eosinophils Absolute: 0.1 10*3/uL (ref 0.0–0.5)
Eosinophils Relative: 3 %
HCT: 28.7 % — ABNORMAL LOW (ref 36.0–46.0)
Hemoglobin: 8.4 g/dL — ABNORMAL LOW (ref 12.0–15.0)
Immature Granulocytes: 1 %
Lymphocytes Relative: 21 %
Lymphs Abs: 0.9 10*3/uL (ref 0.7–4.0)
MCH: 30.3 pg (ref 26.0–34.0)
MCHC: 29.3 g/dL — ABNORMAL LOW (ref 30.0–36.0)
MCV: 103.6 fL — ABNORMAL HIGH (ref 80.0–100.0)
Monocytes Absolute: 0.4 10*3/uL (ref 0.1–1.0)
Monocytes Relative: 8 %
Neutro Abs: 2.8 10*3/uL (ref 1.7–7.7)
Neutrophils Relative %: 66 %
Platelets: 136 10*3/uL — ABNORMAL LOW (ref 150–400)
RBC: 2.77 MIL/uL — ABNORMAL LOW (ref 3.87–5.11)
RDW: 13.8 % (ref 11.5–15.5)
WBC: 4.2 10*3/uL (ref 4.0–10.5)
nRBC: 0 % (ref 0.0–0.2)

## 2021-06-04 LAB — COMPREHENSIVE METABOLIC PANEL
ALT: 15 U/L (ref 0–44)
AST: 13 U/L — ABNORMAL LOW (ref 15–41)
Albumin: 3.4 g/dL — ABNORMAL LOW (ref 3.5–5.0)
Alkaline Phosphatase: 122 U/L (ref 38–126)
Anion gap: 8 (ref 5–15)
BUN: 63 mg/dL — ABNORMAL HIGH (ref 8–23)
CO2: 28 mmol/L (ref 22–32)
Calcium: 8.8 mg/dL — ABNORMAL LOW (ref 8.9–10.3)
Chloride: 106 mmol/L (ref 98–111)
Creatinine, Ser: 2.1 mg/dL — ABNORMAL HIGH (ref 0.44–1.00)
GFR, Estimated: 24 mL/min — ABNORMAL LOW (ref >60)
Glucose, Bld: 85 mg/dL (ref 70–99)
Potassium: 4.6 mmol/L (ref 3.5–5.1)
Sodium: 142 mmol/L (ref 135–145)
Total Bilirubin: 0.5 mg/dL (ref 0.3–1.2)
Total Protein: 7.3 g/dL (ref 6.5–8.1)

## 2021-06-04 LAB — TSH: TSH: 0.439 u[IU]/mL (ref 0.350–4.500)

## 2021-06-04 LAB — CBG MONITORING, ED: Glucose-Capillary: 73 mg/dL (ref 70–99)

## 2021-06-04 LAB — TROPONIN I (HIGH SENSITIVITY)
Troponin I (High Sensitivity): 12 ng/L (ref <18)
Troponin I (High Sensitivity): 13 ng/L (ref <18)

## 2021-06-04 IMAGING — CT CT HEAD W/O CM
3 series · 15 of 47 positions shown, 18 images · non-contrast
Comparison: Brain MRI [DATE].  Head CT [DATE].

CLINICAL DATA: Provided history: Altered mental status,
nontraumatic. Additional history provided: Patient reports
left-sided tremors followed by numbness and weakness on left side,
symptoms began at [DATE] p.m. today, EMS noted slight arm drift on
left side.

EXAM:
CT HEAD WITHOUT CONTRAST
TECHNIQUE: Contiguous axial images were obtained from the base of the skull
through the vertex without intravenous contrast.

[Series 3: head 5.0 h30s · axial · 0.42mm/px · z∈[-270,-130]mm · 9 of 34 slices shown, 12 images]
[im 3/34  brain]
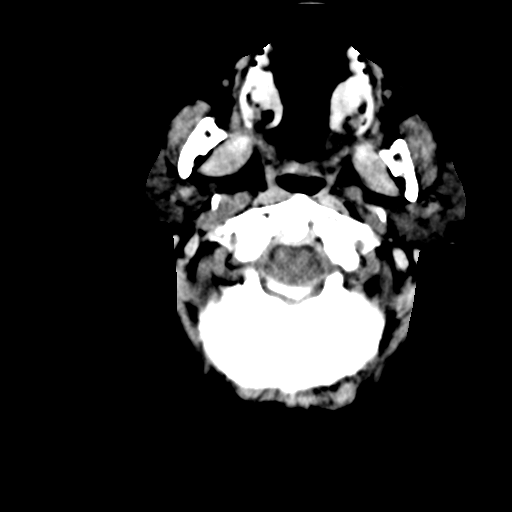
[im 3/34  bone]
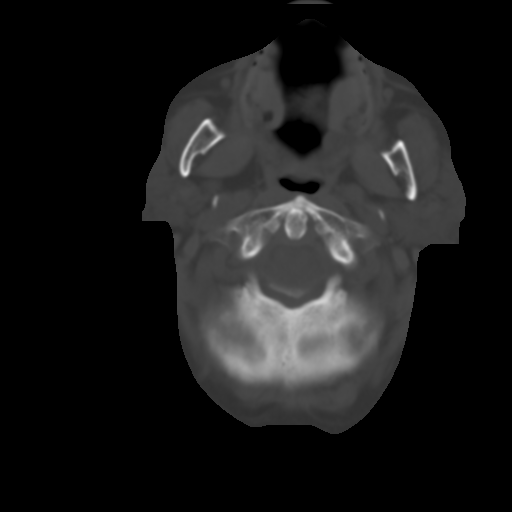
[im 6/34  brain]
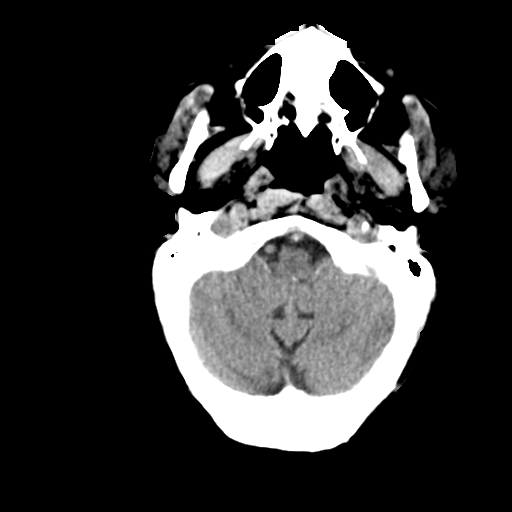
[im 10/34  brain]
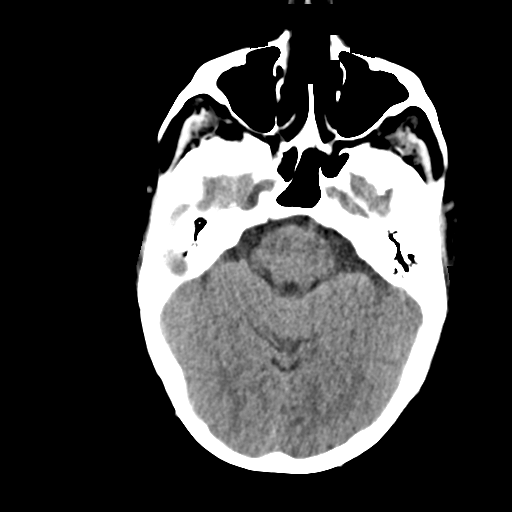
[im 13/34  brain]
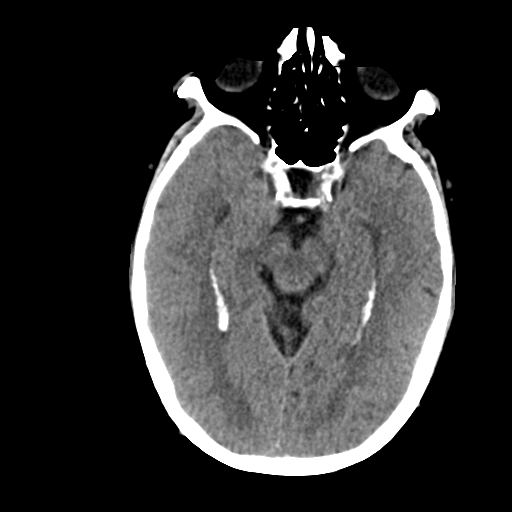
[im 18/34  brain]
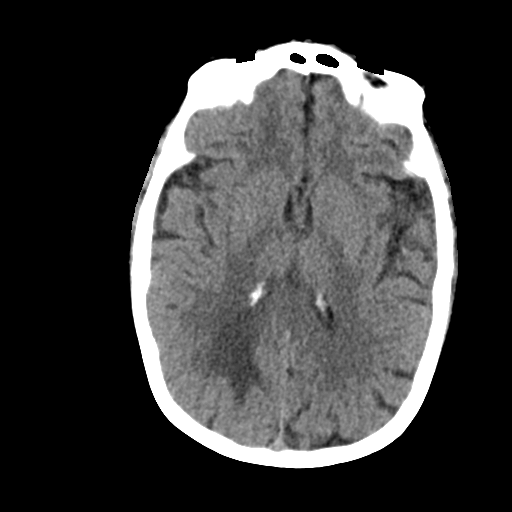
[im 18/34  bone]
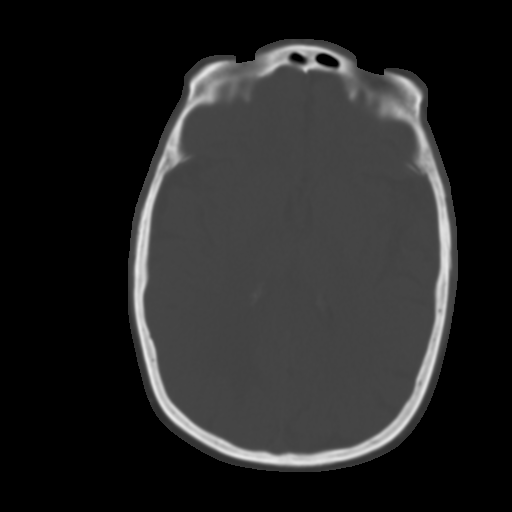
[im 21/34  brain]
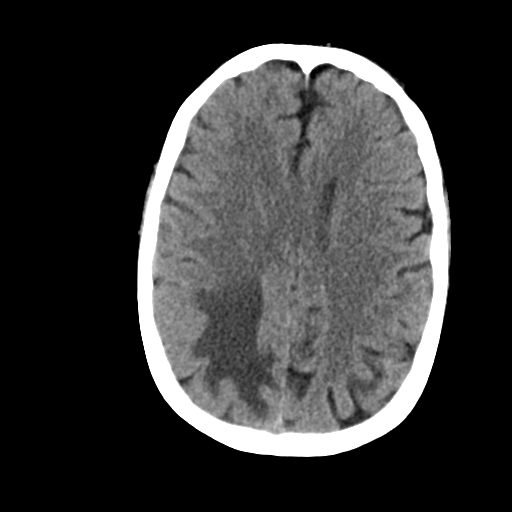
[im 24/34  brain]
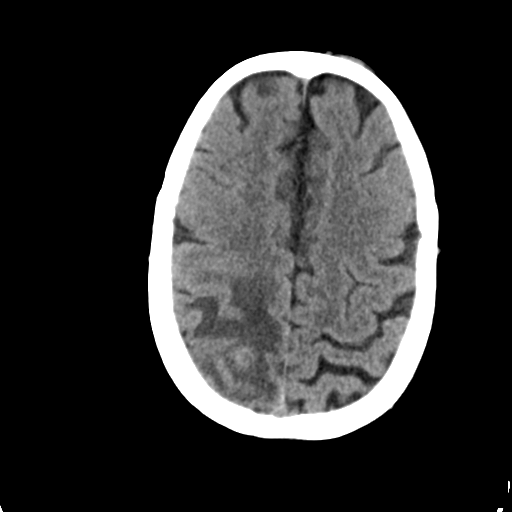
[im 28/34  brain]
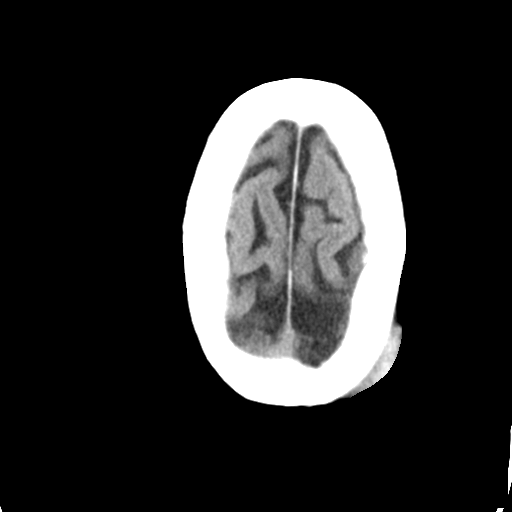
[im 31/34  brain]
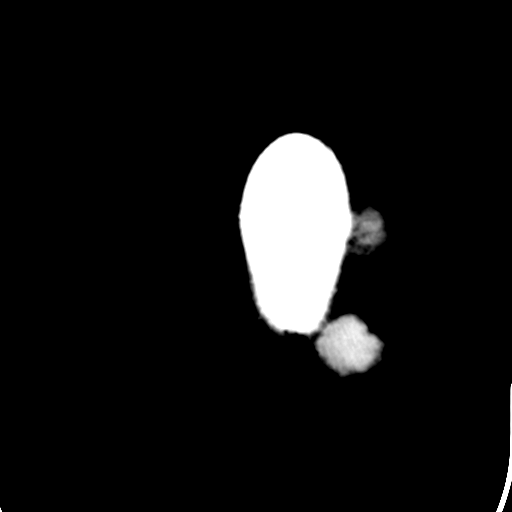
[im 31/34  bone]
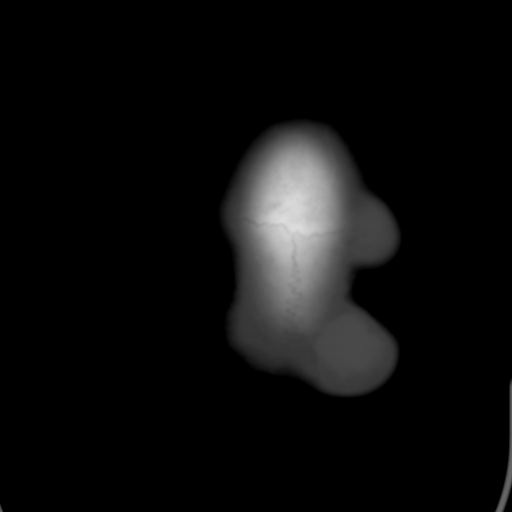

[Series 5: head 3.0 mpr cor · coronal · 0.33mm/px · 3 of 68 slices shown]
[im 23/68  brain]
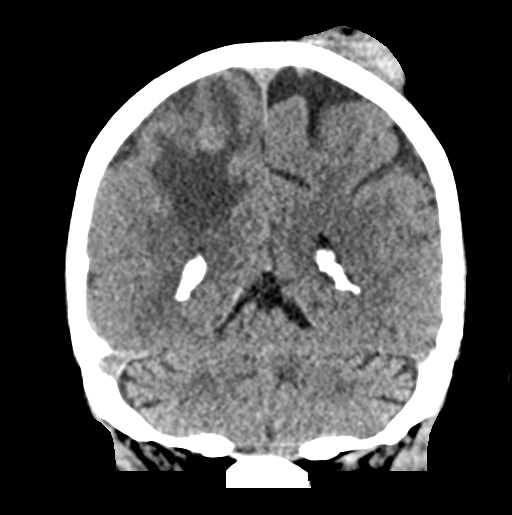
[im 30/68  brain]
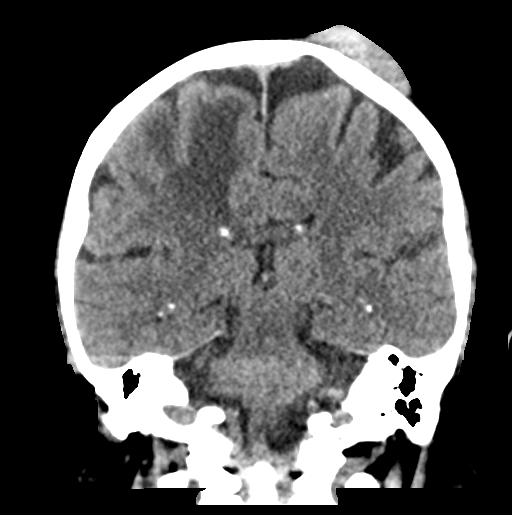
[im 38/68  brain]
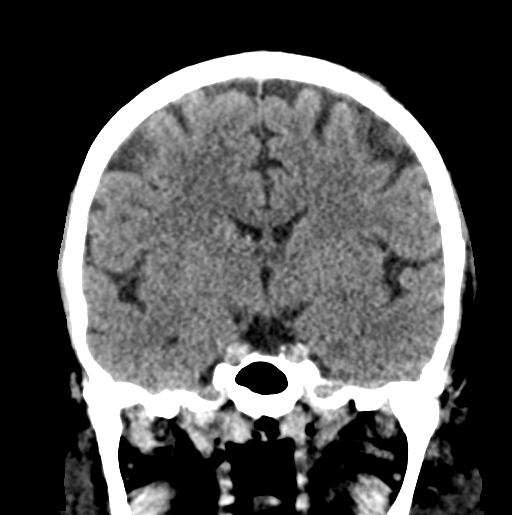

[Series 6: head 3.0 mpr sag · sagittal · 0.34mm/px · 3 of 54 slices shown]
[im 18/54  brain]
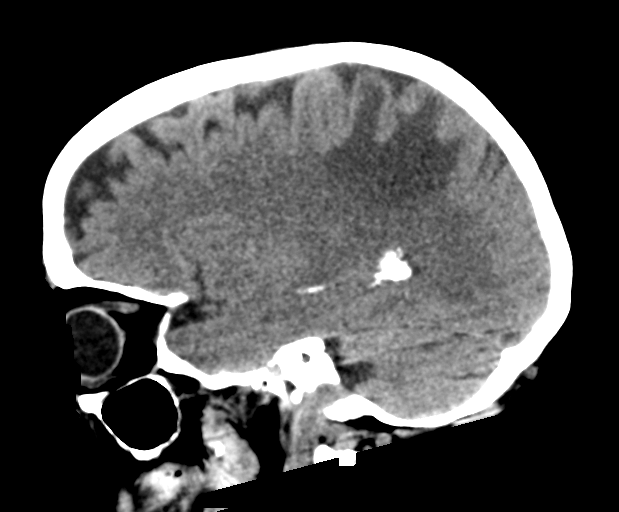
[im 27/54  brain]
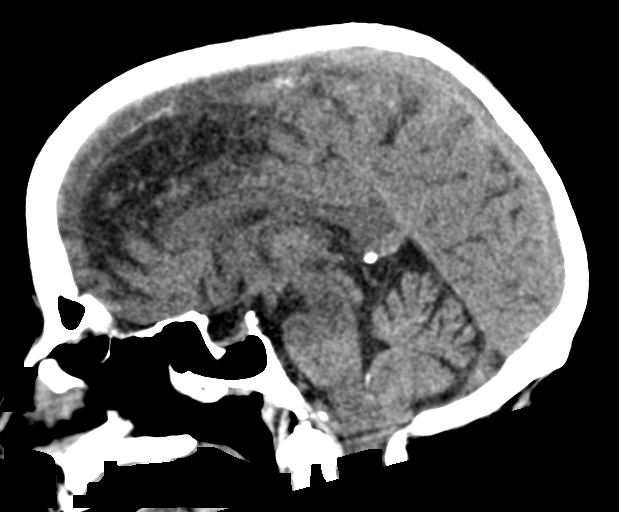
[im 36/54  brain]
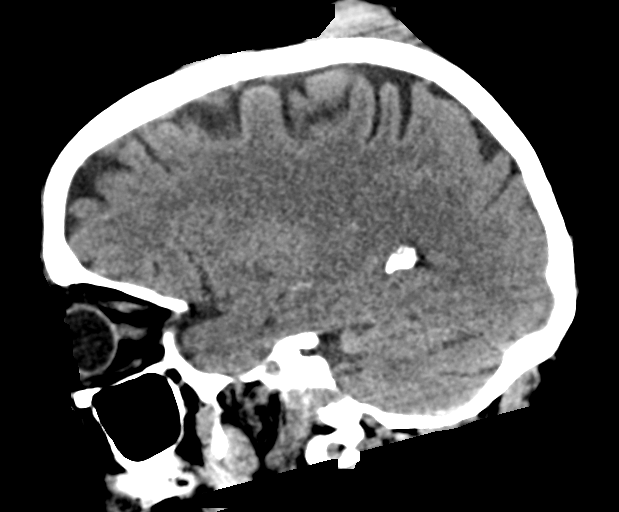

[15 of 47 positions shown; findings below may reference images not displayed]

FINDINGS: Brain:

Cerebral volume is normal.

An 8 mm round hyperdense lesion within the right parietal lobe has
not significantly changed in size from the head CT and brain MRI
examinations of [DATE]. However, moderate surrounding vasogenic
edema has progressed.

No acute demarcated cortical infarct.

No extra-axial fluid collection.

No midline shift.

Vascular: No hyperdense vessel. Atherosclerotic calcifications.

Skull: Normal. Negative for fracture or focal lesion.

Sinuses/Orbits: Visualized orbits show no acute finding. Minimal
mucosal thickening within the right ethmoid air cells.

Other: Small-volume fluid within the right mastoid air cells.
Persistent left frontoparietal scalp hematomas.
IMPRESSION: An 8 mm round hyperdense lesion within the right parietal lobe as
not significantly changed in size since the brain MRI and head CT
examinations of [DATE]. However, moderate surrounding vasogenic
edema has progressed. This is again thought to most likely reflect a
hemorrhagic metastasis. Although considered less likely, a primary
brain tumor or an infectious/inflammatory process (such as
neurocysticercosis) are also possibilities. The prior brain MRI of
[DATE] was motion degraded and performed without intravenous
contrast. Post-contrast MR imaging of the brain would be helpful to
further characterize the right parietal lobe lesion, and would be
more sensitive for the detection of additional small parenchymal
lesions (which may be occult on a non-contrast exam).

Localized mass effect at the right parietal lobe lesion without
midline shift.

Minimal mucosal thickening within the right ethmoid sinuses.

Persistent left frontoparietal scalp hematomas.

Small right mastoid effusion.

## 2021-06-04 MED ORDER — LEVETIRACETAM 500 MG PO TABS: 500.0000 mg | ORAL_TABLET | Freq: Two times a day (BID) | ORAL | 0 refills | Status: AC

## 2021-06-04 MED ORDER — LEVETIRACETAM IN NACL 1000 MG/100ML IV SOLN
1000.0000 mg | Freq: Once | INTRAVENOUS | Status: AC
  Administered 2021-06-04: 1000 mg via INTRAVENOUS
  Filled 2021-06-04: qty 100

## 2021-06-04 NOTE — ED Notes (Signed)
Pt's daughter arrived and stated pt did have a a period of unresponsiveness during the shaking episode of her L side.
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
## 2021-06-04 NOTE — ED Triage Notes (Signed)
Pt BIB [facility]. Pt states she started having tremors to the L side followed by numbness, and weakness on L side. Symptoms started at 13:45 today. By the time EMS got there symptoms resloved and EMS only appreciated a slight arm drift on the L side. Pt has known metastatic CA in the brain.

## 2021-06-04 NOTE — Discharge Instructions (Signed)
You were seen in the emergency department today with seizure like activity.  We are starting you on a seizure medication to help prevent these episodes but the underlying issue of the mass on the brain will be followed up by the neurosurgery team.  I have listed the name of the neurosurgeon on this paperwork.  You can call the office to confirm your next appointment.  They will discuss management options moving forward.  Please follow closely with your primary care doctor and return to the emergency department any new or suddenly worsening symptoms.  You should not drive a car since you are having seizure-like episodes.

## 2021-06-04 NOTE — ED Notes (Signed)
Pt to CT

## 2021-06-04 NOTE — ED Notes (Signed)
Pt placed on bedside commode.

## 2021-06-04 NOTE — ED Notes (Signed)
RN made aware of temp

## 2021-06-04 NOTE — ED Notes (Signed)
Pt refusing rectal temp to confirm oral reading.

## 2021-06-04 NOTE — ED Notes (Signed)
Pt continued to refuse rectal temp check. Pt refusing to keep warm blankets on. Pt adamant about discharge. Pt was stating she was about ti sign out AMA when EDP came in to update pt. Pt told EDP she did not want to state longer. EDP agreeable to discharge.   Pt provided discharge instructions and prescription information. Pt was given the opportunity to ask questions and questions were answered. Discharge signature not obtained in the setting of the COVID-19 pandemic in order to reduce high touch surfaces.

## 2021-06-05 DIAGNOSIS — K219 Gastro-esophageal reflux disease without esophagitis: Secondary | ICD-10-CM | POA: Diagnosis not present

## 2021-06-05 DIAGNOSIS — D631 Anemia in chronic kidney disease: Secondary | ICD-10-CM | POA: Diagnosis not present

## 2021-06-05 DIAGNOSIS — D630 Anemia in neoplastic disease: Secondary | ICD-10-CM | POA: Diagnosis not present

## 2021-06-05 DIAGNOSIS — I130 Hypertensive heart and chronic kidney disease with heart failure and stage 1 through stage 4 chronic kidney disease, or unspecified chronic kidney disease: Secondary | ICD-10-CM | POA: Diagnosis not present

## 2021-06-05 DIAGNOSIS — E785 Hyperlipidemia, unspecified: Secondary | ICD-10-CM | POA: Diagnosis not present

## 2021-06-05 DIAGNOSIS — J449 Chronic obstructive pulmonary disease, unspecified: Secondary | ICD-10-CM | POA: Diagnosis not present

## 2021-06-05 DIAGNOSIS — C7931 Secondary malignant neoplasm of brain: Secondary | ICD-10-CM | POA: Diagnosis not present

## 2021-06-05 DIAGNOSIS — J9611 Chronic respiratory failure with hypoxia: Secondary | ICD-10-CM | POA: Diagnosis not present

## 2021-06-05 DIAGNOSIS — I471 Supraventricular tachycardia: Secondary | ICD-10-CM | POA: Diagnosis not present

## 2021-06-05 DIAGNOSIS — Z9181 History of falling: Secondary | ICD-10-CM | POA: Diagnosis not present

## 2021-06-05 DIAGNOSIS — N184 Chronic kidney disease, stage 4 (severe): Secondary | ICD-10-CM | POA: Diagnosis not present

## 2021-06-05 DIAGNOSIS — I4510 Unspecified right bundle-branch block: Secondary | ICD-10-CM | POA: Diagnosis not present

## 2021-06-05 DIAGNOSIS — E039 Hypothyroidism, unspecified: Secondary | ICD-10-CM | POA: Diagnosis not present

## 2021-06-05 DIAGNOSIS — Z87891 Personal history of nicotine dependence: Secondary | ICD-10-CM | POA: Diagnosis not present

## 2021-06-05 DIAGNOSIS — I5032 Chronic diastolic (congestive) heart failure: Secondary | ICD-10-CM | POA: Diagnosis not present

## 2021-06-05 DIAGNOSIS — E440 Moderate protein-calorie malnutrition: Secondary | ICD-10-CM | POA: Diagnosis not present

## 2021-06-05 DIAGNOSIS — Z9981 Dependence on supplemental oxygen: Secondary | ICD-10-CM | POA: Diagnosis not present

## 2021-06-06 ENCOUNTER — Other Ambulatory Visit: Payer: Self-pay | Admitting: Radiation Therapy

## 2021-06-06 DIAGNOSIS — I4510 Unspecified right bundle-branch block: Secondary | ICD-10-CM | POA: Diagnosis not present

## 2021-06-06 DIAGNOSIS — E039 Hypothyroidism, unspecified: Secondary | ICD-10-CM | POA: Diagnosis not present

## 2021-06-06 DIAGNOSIS — C7931 Secondary malignant neoplasm of brain: Secondary | ICD-10-CM | POA: Diagnosis not present

## 2021-06-06 DIAGNOSIS — E440 Moderate protein-calorie malnutrition: Secondary | ICD-10-CM | POA: Diagnosis not present

## 2021-06-06 DIAGNOSIS — J449 Chronic obstructive pulmonary disease, unspecified: Secondary | ICD-10-CM | POA: Diagnosis not present

## 2021-06-06 DIAGNOSIS — K219 Gastro-esophageal reflux disease without esophagitis: Secondary | ICD-10-CM | POA: Diagnosis not present

## 2021-06-06 DIAGNOSIS — D631 Anemia in chronic kidney disease: Secondary | ICD-10-CM | POA: Diagnosis not present

## 2021-06-06 DIAGNOSIS — I5032 Chronic diastolic (congestive) heart failure: Secondary | ICD-10-CM | POA: Diagnosis not present

## 2021-06-06 DIAGNOSIS — D630 Anemia in neoplastic disease: Secondary | ICD-10-CM | POA: Diagnosis not present

## 2021-06-06 DIAGNOSIS — N184 Chronic kidney disease, stage 4 (severe): Secondary | ICD-10-CM | POA: Diagnosis not present

## 2021-06-06 DIAGNOSIS — J9611 Chronic respiratory failure with hypoxia: Secondary | ICD-10-CM | POA: Diagnosis not present

## 2021-06-06 DIAGNOSIS — E785 Hyperlipidemia, unspecified: Secondary | ICD-10-CM | POA: Diagnosis not present

## 2021-06-06 DIAGNOSIS — I130 Hypertensive heart and chronic kidney disease with heart failure and stage 1 through stage 4 chronic kidney disease, or unspecified chronic kidney disease: Secondary | ICD-10-CM | POA: Diagnosis not present

## 2021-06-06 DIAGNOSIS — Z87891 Personal history of nicotine dependence: Secondary | ICD-10-CM | POA: Diagnosis not present

## 2021-06-06 DIAGNOSIS — Z9181 History of falling: Secondary | ICD-10-CM | POA: Diagnosis not present

## 2021-06-06 DIAGNOSIS — I471 Supraventricular tachycardia: Secondary | ICD-10-CM | POA: Diagnosis not present

## 2021-06-06 DIAGNOSIS — Z9981 Dependence on supplemental oxygen: Secondary | ICD-10-CM | POA: Diagnosis not present

## 2021-06-11 DIAGNOSIS — D630 Anemia in neoplastic disease: Secondary | ICD-10-CM | POA: Diagnosis not present

## 2021-06-11 DIAGNOSIS — D631 Anemia in chronic kidney disease: Secondary | ICD-10-CM | POA: Diagnosis not present

## 2021-06-11 DIAGNOSIS — Z9181 History of falling: Secondary | ICD-10-CM | POA: Diagnosis not present

## 2021-06-11 DIAGNOSIS — K219 Gastro-esophageal reflux disease without esophagitis: Secondary | ICD-10-CM | POA: Diagnosis not present

## 2021-06-11 DIAGNOSIS — Z87891 Personal history of nicotine dependence: Secondary | ICD-10-CM | POA: Diagnosis not present

## 2021-06-11 DIAGNOSIS — E039 Hypothyroidism, unspecified: Secondary | ICD-10-CM | POA: Diagnosis not present

## 2021-06-11 DIAGNOSIS — I471 Supraventricular tachycardia: Secondary | ICD-10-CM | POA: Diagnosis not present

## 2021-06-11 DIAGNOSIS — I130 Hypertensive heart and chronic kidney disease with heart failure and stage 1 through stage 4 chronic kidney disease, or unspecified chronic kidney disease: Secondary | ICD-10-CM | POA: Diagnosis not present

## 2021-06-11 DIAGNOSIS — E440 Moderate protein-calorie malnutrition: Secondary | ICD-10-CM | POA: Diagnosis not present

## 2021-06-11 DIAGNOSIS — Z9981 Dependence on supplemental oxygen: Secondary | ICD-10-CM | POA: Diagnosis not present

## 2021-06-11 DIAGNOSIS — I4510 Unspecified right bundle-branch block: Secondary | ICD-10-CM | POA: Diagnosis not present

## 2021-06-11 DIAGNOSIS — J9611 Chronic respiratory failure with hypoxia: Secondary | ICD-10-CM | POA: Diagnosis not present

## 2021-06-11 DIAGNOSIS — I5032 Chronic diastolic (congestive) heart failure: Secondary | ICD-10-CM | POA: Diagnosis not present

## 2021-06-11 DIAGNOSIS — E785 Hyperlipidemia, unspecified: Secondary | ICD-10-CM | POA: Diagnosis not present

## 2021-06-11 DIAGNOSIS — C7931 Secondary malignant neoplasm of brain: Secondary | ICD-10-CM | POA: Diagnosis not present

## 2021-06-11 DIAGNOSIS — N184 Chronic kidney disease, stage 4 (severe): Secondary | ICD-10-CM | POA: Diagnosis not present

## 2021-06-11 DIAGNOSIS — J449 Chronic obstructive pulmonary disease, unspecified: Secondary | ICD-10-CM | POA: Diagnosis not present

## 2021-06-13 ENCOUNTER — Encounter: Payer: Self-pay | Admitting: Radiation Oncology

## 2021-06-13 ENCOUNTER — Other Ambulatory Visit: Payer: Self-pay

## 2021-06-13 ENCOUNTER — Ambulatory Visit
Admission: RE | Admit: 2021-06-13 | Discharge: 2021-06-13 | Disposition: A | Discharge status: Home / Self Care | Payer: Medicare Other | Source: Ambulatory Visit | Attending: Radiation Oncology | Admitting: Radiation Oncology

## 2021-06-13 ENCOUNTER — Telehealth: Payer: Self-pay | Admitting: *Deleted

## 2021-06-13 VITALS — BP 137/51 | HR 55 | Temp 96.7°F | Resp 18 | Ht 64.0 in

## 2021-06-13 DIAGNOSIS — C561 Malignant neoplasm of right ovary: Secondary | ICD-10-CM | POA: Diagnosis not present

## 2021-06-13 DIAGNOSIS — C7931 Secondary malignant neoplasm of brain: Secondary | ICD-10-CM

## 2021-06-13 DIAGNOSIS — E876 Hypokalemia: Secondary | ICD-10-CM | POA: Insufficient documentation

## 2021-06-13 DIAGNOSIS — K219 Gastro-esophageal reflux disease without esophagitis: Secondary | ICD-10-CM | POA: Insufficient documentation

## 2021-06-13 DIAGNOSIS — I5032 Chronic diastolic (congestive) heart failure: Secondary | ICD-10-CM | POA: Diagnosis not present

## 2021-06-13 DIAGNOSIS — J449 Chronic obstructive pulmonary disease, unspecified: Secondary | ICD-10-CM | POA: Diagnosis not present

## 2021-06-13 DIAGNOSIS — R251 Tremor, unspecified: Secondary | ICD-10-CM | POA: Diagnosis not present

## 2021-06-13 DIAGNOSIS — Z87891 Personal history of nicotine dependence: Secondary | ICD-10-CM | POA: Insufficient documentation

## 2021-06-13 DIAGNOSIS — E039 Hypothyroidism, unspecified: Secondary | ICD-10-CM | POA: Insufficient documentation

## 2021-06-13 DIAGNOSIS — E669 Obesity, unspecified: Secondary | ICD-10-CM | POA: Insufficient documentation

## 2021-06-13 DIAGNOSIS — I471 Supraventricular tachycardia: Secondary | ICD-10-CM | POA: Diagnosis not present

## 2021-06-13 DIAGNOSIS — G936 Cerebral edema: Secondary | ICD-10-CM | POA: Insufficient documentation

## 2021-06-13 DIAGNOSIS — E785 Hyperlipidemia, unspecified: Secondary | ICD-10-CM | POA: Diagnosis not present

## 2021-06-13 DIAGNOSIS — K449 Diaphragmatic hernia without obstruction or gangrene: Secondary | ICD-10-CM | POA: Diagnosis not present

## 2021-06-13 DIAGNOSIS — Z79899 Other long term (current) drug therapy: Secondary | ICD-10-CM | POA: Diagnosis not present

## 2021-06-13 MED ORDER — LORAZEPAM 0.5 MG PO TABS: ORAL_TABLET | ORAL | 0 refills | Status: AC

## 2021-06-13 NOTE — Progress Notes (Signed)
Location/Histology of Brain Tumor: Parietal Lobe  Patient presented after having a fall due to focal LLE seizure.  MRI Brain 06/22/2021:  CT Head 06/04/2021: An 8 mm round hyperdense lesion within the right parietal lobe as not significantly changed in size since the brain MRI and head CT examinations of 05/24/2021. However, moderate surrounding vasogenic edema has progressed. This is again thought to most likely reflect a hemorrhagic metastasis. Although considered less likely, a primary brain tumor or an infectious/inflammatory process (such as neurocysticercosis) are also possibilities  MRI Brain 05/24/2021: 9-10 mm hemorrhagic lesion at the right parietal vertex with surrounding vasogenic edema. Most likely diagnosis would be hemorrhagic metastatic lesion. Primary brain tumor is less likely  CT Head 05/24/2021: 8 mm focus of hyperdensity in the right parietal lobe with significant surrounding edema is suspicious for a hemorrhagic metastasis. Recommend brain MRI with and without contrast for further evaluation.  Large left frontoparietal scalp hematoma and smaller hematoma in the left forehead without underlying calvarial fracture.  Past or anticipated interventions, if any, per neurosurgery:  Dr. Zada Finders 05/25/2021 -Dr. Aileen Fass informed me that she has a known primary - high grade fallopian ca, easy decision in my opinion to proceed with SRS to hopefully improve seizure control as well as prevent further growth of the lesion. -No surgical resection planned given her poor overall health status. -Will add her on to tumor board for next week to start the process of getting her plugged in to get SRS.  05/24/2021 -recommend metastatic workup to search for primary, I do agree that this looks like a metastatic lesion.  -If no primary can be found, will need a craniotomy for resection to get a diagnosis / guide treatment and help with seizure control.  -If primary can be found and pathology  isn't needed from the brain met, likely can be treated with SRS depending on suspected pathology.     Past or anticipated interventions, if any, per medical oncology:   Dose of Decadron, if applicable: n/a  Recent neurologic symptoms, if any:  Seizures: No Headaches: No Nausea: No Dizziness/ataxia: No Difficulty with hand coordination: Left side, unable to hold anything in the left hand. Focal numbness/weakness: She reports numbness on her left side. Visual deficits/changes: No Confusion/Memory deficits: Yes.    SAFETY ISSUES: Prior radiation? No Pacemaker/ICD? No Possible current pregnancy? Postmenopausal Is the patient on methotrexate? No  Additional Complaints / other details:  -Wears 3 liters oxygen on occasion.

## 2021-06-13 NOTE — Telephone Encounter (Signed)
RETURNED PATIENT'S CAREGIVER'S PHONE CALL, SPOKE WITH PATIENT'S CAREGIVER
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
## 2021-06-14 ENCOUNTER — Other Ambulatory Visit: Payer: Self-pay

## 2021-06-14 ENCOUNTER — Emergency Department (HOSPITAL_COMMUNITY): Payer: Medicare Other

## 2021-06-14 ENCOUNTER — Encounter (HOSPITAL_COMMUNITY): Payer: Self-pay | Admitting: Oncology

## 2021-06-14 ENCOUNTER — Emergency Department (HOSPITAL_COMMUNITY)
Admission: EM | Admit: 2021-06-14 | Discharge: 2021-06-15 | Disposition: A | Discharge status: Home / Self Care | Payer: Medicare Other | Attending: Emergency Medicine | Admitting: Emergency Medicine

## 2021-06-14 ENCOUNTER — Telehealth: Payer: Self-pay | Admitting: *Deleted

## 2021-06-14 DIAGNOSIS — D630 Anemia in neoplastic disease: Secondary | ICD-10-CM | POA: Diagnosis not present

## 2021-06-14 DIAGNOSIS — Z8542 Personal history of malignant neoplasm of other parts of uterus: Secondary | ICD-10-CM | POA: Diagnosis not present

## 2021-06-14 DIAGNOSIS — E039 Hypothyroidism, unspecified: Secondary | ICD-10-CM | POA: Diagnosis not present

## 2021-06-14 DIAGNOSIS — I509 Heart failure, unspecified: Secondary | ICD-10-CM | POA: Insufficient documentation

## 2021-06-14 DIAGNOSIS — I6523 Occlusion and stenosis of bilateral carotid arteries: Secondary | ICD-10-CM | POA: Diagnosis not present

## 2021-06-14 DIAGNOSIS — I5032 Chronic diastolic (congestive) heart failure: Secondary | ICD-10-CM | POA: Diagnosis not present

## 2021-06-14 DIAGNOSIS — R609 Edema, unspecified: Secondary | ICD-10-CM | POA: Diagnosis not present

## 2021-06-14 DIAGNOSIS — Z8541 Personal history of malignant neoplasm of cervix uteri: Secondary | ICD-10-CM | POA: Insufficient documentation

## 2021-06-14 DIAGNOSIS — E785 Hyperlipidemia, unspecified: Secondary | ICD-10-CM | POA: Diagnosis not present

## 2021-06-14 DIAGNOSIS — Z79899 Other long term (current) drug therapy: Secondary | ICD-10-CM | POA: Insufficient documentation

## 2021-06-14 DIAGNOSIS — Z9981 Dependence on supplemental oxygen: Secondary | ICD-10-CM | POA: Diagnosis not present

## 2021-06-14 DIAGNOSIS — Z9181 History of falling: Secondary | ICD-10-CM | POA: Diagnosis not present

## 2021-06-14 DIAGNOSIS — J449 Chronic obstructive pulmonary disease, unspecified: Secondary | ICD-10-CM | POA: Diagnosis not present

## 2021-06-14 DIAGNOSIS — I10 Essential (primary) hypertension: Secondary | ICD-10-CM | POA: Diagnosis not present

## 2021-06-14 DIAGNOSIS — K219 Gastro-esophageal reflux disease without esophagitis: Secondary | ICD-10-CM | POA: Diagnosis not present

## 2021-06-14 DIAGNOSIS — D496 Neoplasm of unspecified behavior of brain: Secondary | ICD-10-CM

## 2021-06-14 DIAGNOSIS — C7982 Secondary malignant neoplasm of genital organs: Secondary | ICD-10-CM

## 2021-06-14 DIAGNOSIS — I130 Hypertensive heart and chronic kidney disease with heart failure and stage 1 through stage 4 chronic kidney disease, or unspecified chronic kidney disease: Secondary | ICD-10-CM | POA: Diagnosis not present

## 2021-06-14 DIAGNOSIS — D631 Anemia in chronic kidney disease: Secondary | ICD-10-CM | POA: Diagnosis not present

## 2021-06-14 DIAGNOSIS — Z87891 Personal history of nicotine dependence: Secondary | ICD-10-CM | POA: Diagnosis not present

## 2021-06-14 DIAGNOSIS — N184 Chronic kidney disease, stage 4 (severe): Secondary | ICD-10-CM | POA: Diagnosis not present

## 2021-06-14 DIAGNOSIS — E1122 Type 2 diabetes mellitus with diabetic chronic kidney disease: Secondary | ICD-10-CM | POA: Insufficient documentation

## 2021-06-14 DIAGNOSIS — R6889 Other general symptoms and signs: Secondary | ICD-10-CM | POA: Diagnosis not present

## 2021-06-14 DIAGNOSIS — Z743 Need for continuous supervision: Secondary | ICD-10-CM | POA: Diagnosis not present

## 2021-06-14 DIAGNOSIS — J9611 Chronic respiratory failure with hypoxia: Secondary | ICD-10-CM | POA: Diagnosis not present

## 2021-06-14 DIAGNOSIS — R29818 Other symptoms and signs involving the nervous system: Secondary | ICD-10-CM | POA: Diagnosis not present

## 2021-06-14 DIAGNOSIS — E440 Moderate protein-calorie malnutrition: Secondary | ICD-10-CM | POA: Diagnosis not present

## 2021-06-14 DIAGNOSIS — R531 Weakness: Secondary | ICD-10-CM | POA: Diagnosis not present

## 2021-06-14 DIAGNOSIS — C7931 Secondary malignant neoplasm of brain: Secondary | ICD-10-CM | POA: Diagnosis not present

## 2021-06-14 DIAGNOSIS — G936 Cerebral edema: Secondary | ICD-10-CM | POA: Diagnosis not present

## 2021-06-14 DIAGNOSIS — C719 Malignant neoplasm of brain, unspecified: Secondary | ICD-10-CM | POA: Diagnosis not present

## 2021-06-14 DIAGNOSIS — I471 Supraventricular tachycardia: Secondary | ICD-10-CM | POA: Diagnosis not present

## 2021-06-14 DIAGNOSIS — I4510 Unspecified right bundle-branch block: Secondary | ICD-10-CM | POA: Diagnosis not present

## 2021-06-14 LAB — COMPREHENSIVE METABOLIC PANEL
ALT: 22 U/L (ref 0–44)
AST: 18 U/L (ref 15–41)
Albumin: 3.7 g/dL (ref 3.5–5.0)
Alkaline Phosphatase: 114 U/L (ref 38–126)
Anion gap: 4 — ABNORMAL LOW (ref 5–15)
BUN: 63 mg/dL — ABNORMAL HIGH (ref 8–23)
CO2: 29 mmol/L (ref 22–32)
Calcium: 8.7 mg/dL — ABNORMAL LOW (ref 8.9–10.3)
Chloride: 110 mmol/L (ref 98–111)
Creatinine, Ser: 2.14 mg/dL — ABNORMAL HIGH (ref 0.44–1.00)
GFR, Estimated: 23 mL/min — ABNORMAL LOW (ref >60)
Glucose, Bld: 79 mg/dL (ref 70–99)
Potassium: 4.7 mmol/L (ref 3.5–5.1)
Sodium: 143 mmol/L (ref 135–145)
Total Bilirubin: 0.6 mg/dL (ref 0.3–1.2)
Total Protein: 7.5 g/dL (ref 6.5–8.1)

## 2021-06-14 LAB — CBC WITH DIFFERENTIAL/PLATELET
Abs Immature Granulocytes: 0.01 10*3/uL (ref 0.00–0.07)
Basophils Absolute: 0 10*3/uL (ref 0.0–0.1)
Basophils Relative: 1 %
Eosinophils Absolute: 0.1 10*3/uL (ref 0.0–0.5)
Eosinophils Relative: 2 %
HCT: 27.9 % — ABNORMAL LOW (ref 36.0–46.0)
Hemoglobin: 8.4 g/dL — ABNORMAL LOW (ref 12.0–15.0)
Immature Granulocytes: 0 %
Lymphocytes Relative: 22 %
Lymphs Abs: 0.7 10*3/uL (ref 0.7–4.0)
MCH: 31 pg (ref 26.0–34.0)
MCHC: 30.1 g/dL (ref 30.0–36.0)
MCV: 103 fL — ABNORMAL HIGH (ref 80.0–100.0)
Monocytes Absolute: 0.3 10*3/uL (ref 0.1–1.0)
Monocytes Relative: 10 %
Neutro Abs: 2.1 10*3/uL (ref 1.7–7.7)
Neutrophils Relative %: 65 %
Platelets: 107 10*3/uL — ABNORMAL LOW (ref 150–400)
RBC: 2.71 MIL/uL — ABNORMAL LOW (ref 3.87–5.11)
RDW: 14.3 % (ref 11.5–15.5)
WBC: 3.2 10*3/uL — ABNORMAL LOW (ref 4.0–10.5)
nRBC: 0 % (ref 0.0–0.2)

## 2021-06-14 IMAGING — CT CT HEAD W/O CM
3 series · 14 of 47 positions shown, 16 images · non-contrast
Comparison: CT head [DATE], MRI head [DATE]

CLINICAL DATA: Neuro deficit, acute, stroke suspected Brain/CNS
neoplasm, monitor Brain metastases suspected But with history of
metastatic brain tumor.

EXAM:
CT HEAD WITHOUT CONTRAST
TECHNIQUE: Contiguous axial images were obtained from the base of the skull
through the vertex without intravenous contrast.

[Series 3: head wo · axial · 0.47mm/px · z∈[-150,-20]mm · 8 of 32 slices shown, 10 images]
[im 3/32  brain]
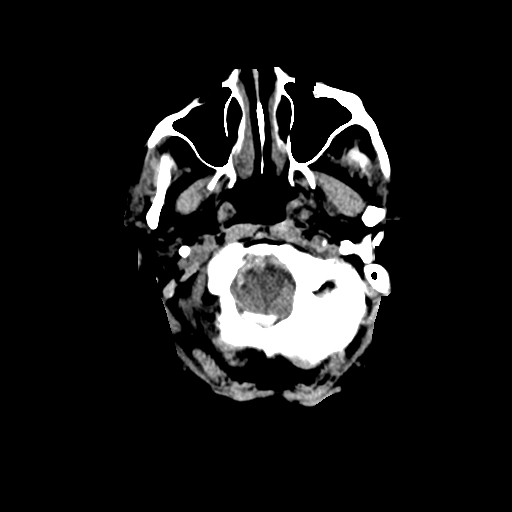
[im 3/32  bone]
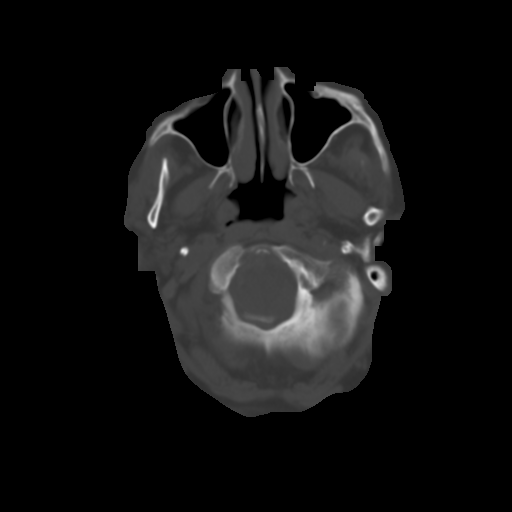
[im 7/32  brain]
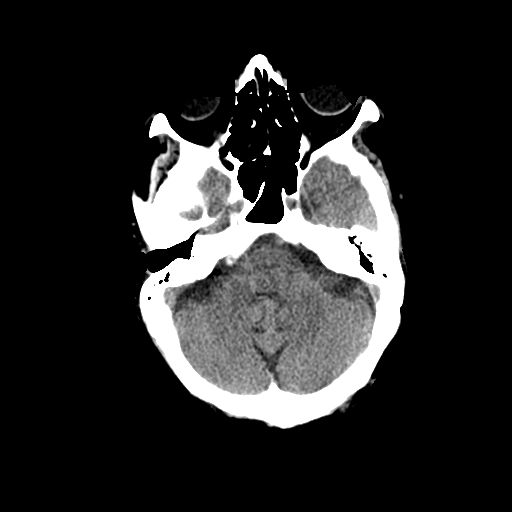
[im 10/32  brain]
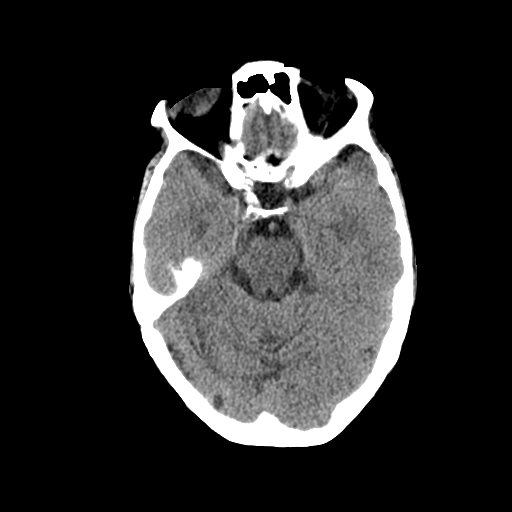
[im 14/32  brain]
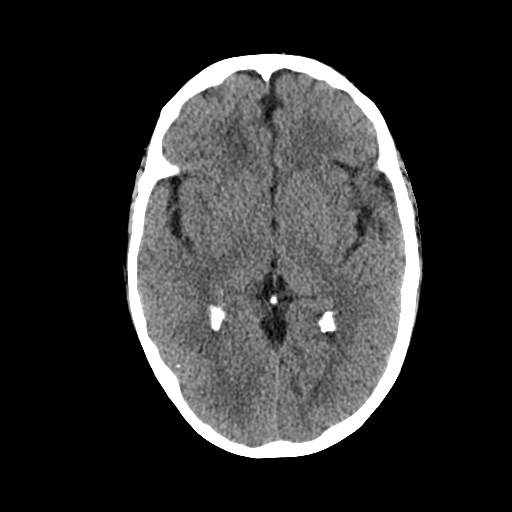
[im 18/32  brain]
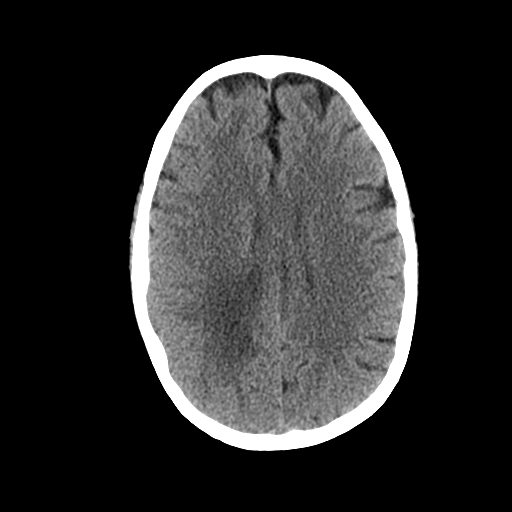
[im 18/32  bone]
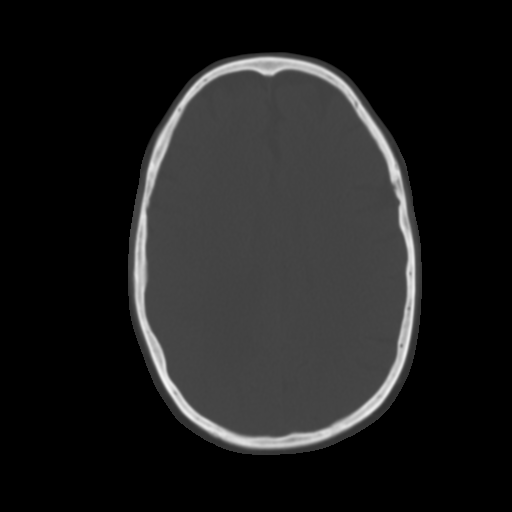
[im 22/32  brain]
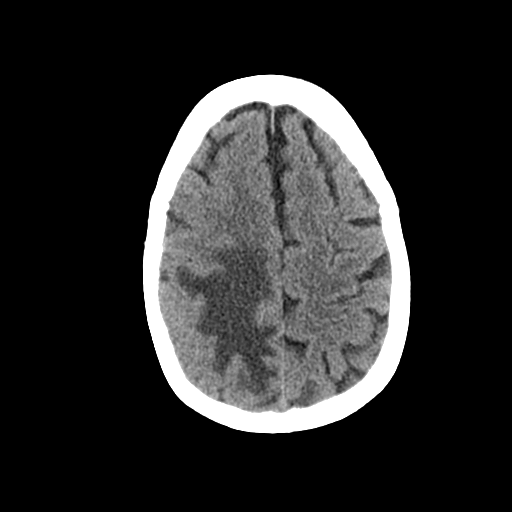
[im 25/32  brain]
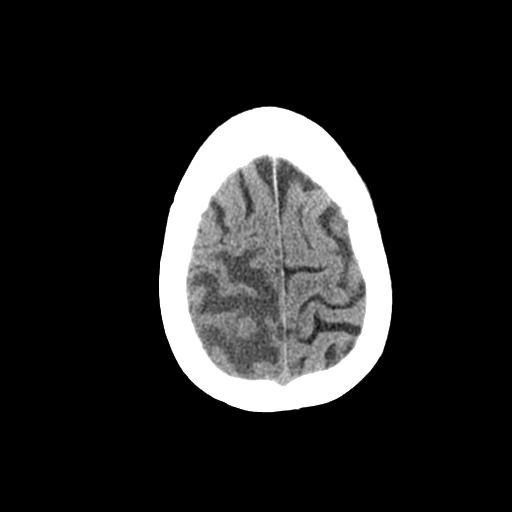
[im 29/32  brain]
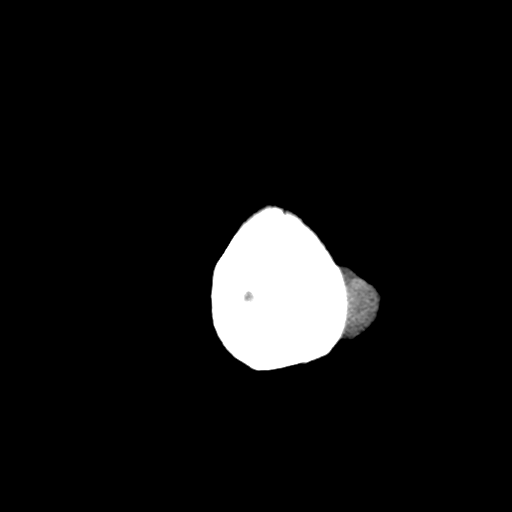

[Series 4: coronal soft tissue · coronal · 0.33mm/px · 3 of 66 slices shown]
[im 22/66  brain]
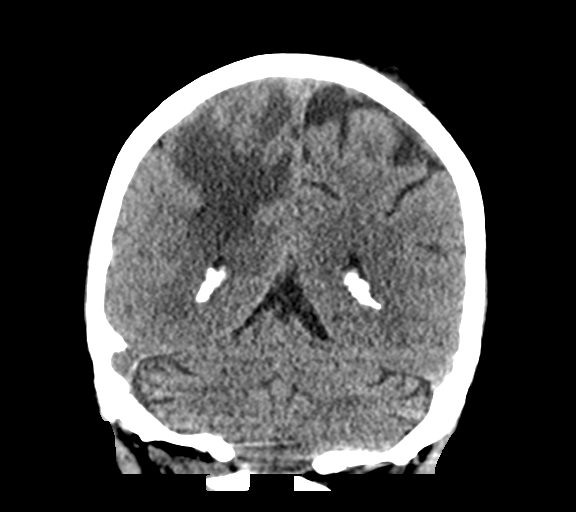
[im 29/66  brain]
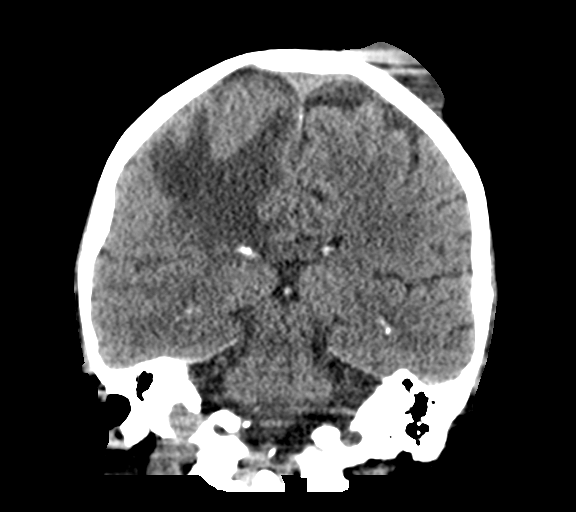
[im 37/66  brain]
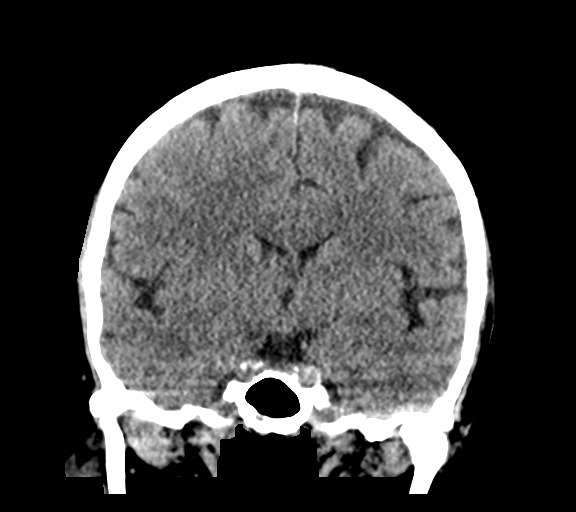

[Series 5: sagittal soft tissue · sagittal · 0.34mm/px · 3 of 50 slices shown]
[im 17/50  brain]
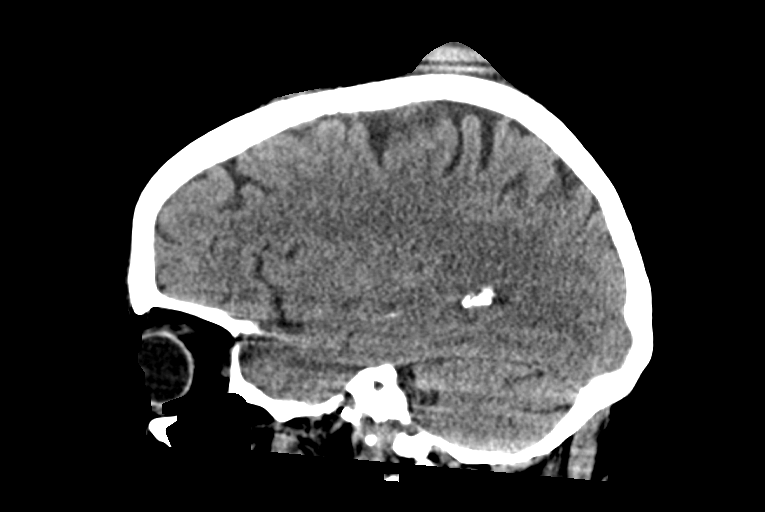
[im 25/50  brain]
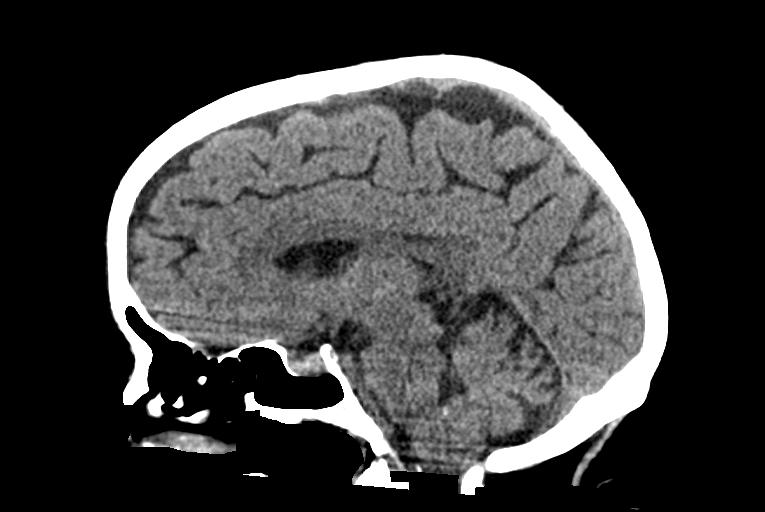
[im 33/50  brain]
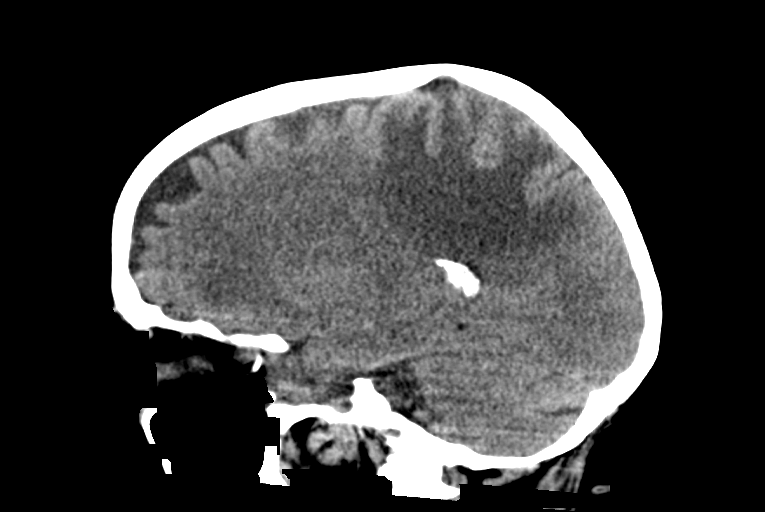

[14 of 47 positions shown; findings below may reference images not displayed]

FINDINGS: Brain:

No evidence of large-territorial acute infarction. No parenchymal
hemorrhage. Redemonstration of an approximately 1 cm right parietal
lesion with associated vasogenic edema. No extra-axial collection.

No mass effect or midline shift. No hydrocephalus. Basilar cisterns
are patent.

Vascular: No hyperdense vessel. Atherosclerotic calcifications are
present within the cavernous internal carotid arteries.

Skull: No acute fracture or focal lesion.

Sinuses/Orbits: Paranasal sinuses and mastoid air cells are clear.
Bilateral lens replacement. Otherwise orbits are unremarkable.

Other: Similar-appearing 1.1 cm left parietal scalp density. Smaller
density has decreased in size and is almost resolved.
IMPRESSION: 1.  No acute intracranial abnormality.
2. Persistent approximately 1 cm right parietal lesion with
associated vasogenic edema.
3. Similar-appearing 1.1 cm left parietal scalp density. Smaller
density has decreased in size and is almost resolved.

## 2021-06-14 MED ORDER — DEXAMETHASONE SODIUM PHOSPHATE 10 MG/ML IJ SOLN
6.0000 mg | Freq: Once | INTRAMUSCULAR | Status: AC
  Administered 2021-06-14: 20:00:00 6 mg via INTRAVENOUS
  Filled 2021-06-14: qty 1

## 2021-06-14 MED ORDER — DEXAMETHASONE 0.5 MG PO TABS: 2.0000 mg | ORAL_TABLET | Freq: Two times a day (BID) | ORAL | 1 refills | Status: AC

## 2021-06-14 NOTE — ED Notes (Signed)
Consult call for call back to [facility] MD from [phone]

## 2021-06-14 NOTE — ED Triage Notes (Signed)
Pt bib [facility] d/t left arm weakness that began last night.  Pt dx w/ brain tumor 05/24/21.  Pt denies pain.  EMS reports 1st degree block w/ HR in the 40-50's.

## 2021-06-14 NOTE — Telephone Encounter (Signed)
Called patient to inform of appt.with Dr. Alvy Bimler on 07-03-21- arrival time-12:45 pm @ [facility], spoke with patient's caregiver- Marcene Brawn and she is aware of this appt.
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
## 2021-06-14 NOTE — Discharge Instructions (Signed)
Follow-up with neurosurgery and radiation oncology as scheduled.  If you do not have an appointment with neurosurgery call and make an appointment.  They were recommending that you take the Decadron 2 mg twice a day.  Continue taking your Keppra.  Return for any new or worse symptoms.

## 2021-06-15 ENCOUNTER — Encounter (HOSPITAL_COMMUNITY): Payer: Self-pay

## 2021-06-15 ENCOUNTER — Emergency Department (HOSPITAL_COMMUNITY)
Admission: EM | Admit: 2021-06-15 | Discharge: 2021-06-15 | Disposition: A | Discharge status: Home / Self Care | Payer: Medicare Other | Source: Home / Self Care | Attending: Emergency Medicine | Admitting: Emergency Medicine

## 2021-06-15 DIAGNOSIS — Z8542 Personal history of malignant neoplasm of other parts of uterus: Secondary | ICD-10-CM | POA: Insufficient documentation

## 2021-06-15 DIAGNOSIS — R6889 Other general symptoms and signs: Secondary | ICD-10-CM | POA: Diagnosis not present

## 2021-06-15 DIAGNOSIS — N184 Chronic kidney disease, stage 4 (severe): Secondary | ICD-10-CM | POA: Insufficient documentation

## 2021-06-15 DIAGNOSIS — I5032 Chronic diastolic (congestive) heart failure: Secondary | ICD-10-CM | POA: Insufficient documentation

## 2021-06-15 DIAGNOSIS — J449 Chronic obstructive pulmonary disease, unspecified: Secondary | ICD-10-CM | POA: Insufficient documentation

## 2021-06-15 DIAGNOSIS — E1122 Type 2 diabetes mellitus with diabetic chronic kidney disease: Secondary | ICD-10-CM | POA: Insufficient documentation

## 2021-06-15 DIAGNOSIS — I130 Hypertensive heart and chronic kidney disease with heart failure and stage 1 through stage 4 chronic kidney disease, or unspecified chronic kidney disease: Secondary | ICD-10-CM | POA: Insufficient documentation

## 2021-06-15 DIAGNOSIS — Z7401 Bed confinement status: Secondary | ICD-10-CM | POA: Diagnosis not present

## 2021-06-15 DIAGNOSIS — E039 Hypothyroidism, unspecified: Secondary | ICD-10-CM | POA: Insufficient documentation

## 2021-06-15 DIAGNOSIS — M2550 Pain in unspecified joint: Secondary | ICD-10-CM | POA: Diagnosis not present

## 2021-06-15 DIAGNOSIS — Z87891 Personal history of nicotine dependence: Secondary | ICD-10-CM | POA: Insufficient documentation

## 2021-06-15 DIAGNOSIS — J9611 Chronic respiratory failure with hypoxia: Secondary | ICD-10-CM | POA: Insufficient documentation

## 2021-06-15 DIAGNOSIS — Z79899 Other long term (current) drug therapy: Secondary | ICD-10-CM | POA: Insufficient documentation

## 2021-06-15 MED ORDER — LEVETIRACETAM 500 MG PO TABS
500.0000 mg | ORAL_TABLET | Freq: Two times a day (BID) | ORAL | Status: DC
  Administered 2021-06-15: 07:00:00 500 mg via ORAL
  Filled 2021-06-15: qty 1

## 2021-06-15 MED ORDER — LEVOTHYROXINE SODIUM 50 MCG PO TABS: 150.0000 ug | ORAL_TABLET | Freq: Every day | ORAL | Status: DC

## 2021-06-15 MED ORDER — TORSEMIDE 20 MG PO TABS
20.0000 mg | ORAL_TABLET | Freq: Every morning | ORAL | Status: DC
  Filled 2021-06-15: qty 1

## 2021-06-15 MED ORDER — ONDANSETRON HCL 4 MG PO TABS: 4.0000 mg | ORAL_TABLET | Freq: Three times a day (TID) | ORAL | Status: DC | PRN

## 2021-06-15 MED ORDER — DEXAMETHASONE 2 MG PO TABS
2.0000 mg | ORAL_TABLET | Freq: Two times a day (BID) | ORAL | Status: DC
  Administered 2021-06-15: 15:00:00 2 mg via ORAL
  Filled 2021-06-15: qty 1

## 2021-06-15 MED ORDER — ACETAMINOPHEN 325 MG PO TABS: 650.0000 mg | ORAL_TABLET | ORAL | Status: DC | PRN

## 2021-06-15 MED ORDER — LEVETIRACETAM 500 MG PO TABS: 500.0000 mg | ORAL_TABLET | Freq: Two times a day (BID) | ORAL | Status: DC

## 2021-06-15 MED ORDER — LEVOTHYROXINE SODIUM 50 MCG PO TABS
150.0000 ug | ORAL_TABLET | Freq: Every day | ORAL | Status: DC
  Administered 2021-06-15: 07:00:00 150 ug via ORAL
  Filled 2021-06-15: qty 1

## 2021-06-15 MED ORDER — TORSEMIDE 20 MG PO TABS
20.0000 mg | ORAL_TABLET | Freq: Every day | ORAL | Status: DC
  Administered 2021-06-15: 07:00:00 20 mg via ORAL
  Filled 2021-06-15: qty 1

## 2021-06-15 MED ORDER — NYSTATIN 100000 UNIT/GM EX POWD
1.0000 "application " | Freq: Two times a day (BID) | CUTANEOUS | Status: DC | PRN
  Filled 2021-06-15: qty 15
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
## 2021-06-15 NOTE — ED Notes (Signed)
PTAR called for transport.  
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
## 2021-06-15 NOTE — ED Notes (Signed)
PTAR called to arrange transport back home.

## 2021-06-19 DIAGNOSIS — Z7409 Other reduced mobility: Secondary | ICD-10-CM | POA: Diagnosis not present

## 2021-06-19 DIAGNOSIS — R5381 Other malaise: Secondary | ICD-10-CM | POA: Diagnosis not present

## 2021-06-19 DIAGNOSIS — C7931 Secondary malignant neoplasm of brain: Secondary | ICD-10-CM | POA: Diagnosis not present

## 2021-06-19 DIAGNOSIS — R29898 Other symptoms and signs involving the musculoskeletal system: Secondary | ICD-10-CM | POA: Diagnosis not present

## 2021-06-19 DIAGNOSIS — I739 Peripheral vascular disease, unspecified: Secondary | ICD-10-CM | POA: Diagnosis not present

## 2021-06-21 ENCOUNTER — Telehealth: Payer: Self-pay

## 2021-06-21 DIAGNOSIS — N184 Chronic kidney disease, stage 4 (severe): Secondary | ICD-10-CM | POA: Diagnosis not present

## 2021-06-21 DIAGNOSIS — E039 Hypothyroidism, unspecified: Secondary | ICD-10-CM | POA: Diagnosis not present

## 2021-06-21 DIAGNOSIS — Z87891 Personal history of nicotine dependence: Secondary | ICD-10-CM | POA: Diagnosis not present

## 2021-06-21 DIAGNOSIS — I4510 Unspecified right bundle-branch block: Secondary | ICD-10-CM | POA: Diagnosis not present

## 2021-06-21 DIAGNOSIS — I130 Hypertensive heart and chronic kidney disease with heart failure and stage 1 through stage 4 chronic kidney disease, or unspecified chronic kidney disease: Secondary | ICD-10-CM | POA: Diagnosis not present

## 2021-06-21 DIAGNOSIS — J449 Chronic obstructive pulmonary disease, unspecified: Secondary | ICD-10-CM | POA: Diagnosis not present

## 2021-06-21 DIAGNOSIS — K219 Gastro-esophageal reflux disease without esophagitis: Secondary | ICD-10-CM | POA: Diagnosis not present

## 2021-06-21 DIAGNOSIS — J9611 Chronic respiratory failure with hypoxia: Secondary | ICD-10-CM | POA: Diagnosis not present

## 2021-06-21 DIAGNOSIS — E785 Hyperlipidemia, unspecified: Secondary | ICD-10-CM | POA: Diagnosis not present

## 2021-06-21 DIAGNOSIS — Z9981 Dependence on supplemental oxygen: Secondary | ICD-10-CM | POA: Diagnosis not present

## 2021-06-21 DIAGNOSIS — C7931 Secondary malignant neoplasm of brain: Secondary | ICD-10-CM | POA: Diagnosis not present

## 2021-06-21 DIAGNOSIS — I5032 Chronic diastolic (congestive) heart failure: Secondary | ICD-10-CM | POA: Diagnosis not present

## 2021-06-21 DIAGNOSIS — E440 Moderate protein-calorie malnutrition: Secondary | ICD-10-CM | POA: Diagnosis not present

## 2021-06-21 DIAGNOSIS — I471 Supraventricular tachycardia: Secondary | ICD-10-CM | POA: Diagnosis not present

## 2021-06-21 DIAGNOSIS — Z9181 History of falling: Secondary | ICD-10-CM | POA: Diagnosis not present

## 2021-06-21 DIAGNOSIS — D631 Anemia in chronic kidney disease: Secondary | ICD-10-CM | POA: Diagnosis not present

## 2021-06-21 DIAGNOSIS — D630 Anemia in neoplastic disease: Secondary | ICD-10-CM | POA: Diagnosis not present

## 2021-06-21 NOTE — Telephone Encounter (Signed)
Called regarding appt scheduled 1/10 with Dr. Alvy Bimler. Told per Dr. Alvy Bimler, she has no treatment options for her unfortunately. Ask if she wanted a referral for a second opinion to [facility], she declined referral to [facility] [address]. She does not want to waste her time coming to appts with no options. 1/10 appt canceled with Dr. Alvy Bimler. She is aware. She started palliative care last week. She will call the office back if she changes her mind about a referral to [facility].

## 2021-06-22 ENCOUNTER — Other Ambulatory Visit: Payer: Self-pay

## 2021-06-22 ENCOUNTER — Ambulatory Visit
Admission: RE | Admit: 2021-06-22 | Discharge: 2021-06-22 | Disposition: A | Discharge status: Home / Self Care | Payer: Medicare Other | Source: Ambulatory Visit | Attending: Radiation Oncology | Admitting: Radiation Oncology

## 2021-06-22 DIAGNOSIS — C719 Malignant neoplasm of brain, unspecified: Secondary | ICD-10-CM | POA: Diagnosis not present

## 2021-06-22 DIAGNOSIS — C7931 Secondary malignant neoplasm of brain: Secondary | ICD-10-CM

## 2021-06-22 DIAGNOSIS — Z01818 Encounter for other preprocedural examination: Secondary | ICD-10-CM | POA: Diagnosis not present

## 2021-06-22 IMAGING — MR MR HEAD WO/W CM
13 series · 48 of 48 positions shown · IV contrast (multihance)
Comparison: Head CT [DATE] and MRI [DATE]

.  CLINICAL DATA:
Brain/CNS neoplasm, staging [G4] protocol for treatment planning.
Metastatic ovarian cancer.

EXAM:
MRI HEAD WITHOUT AND WITH CONTRAST
TECHNIQUE: Multiplanar, multiecho pulse sequences of the brain and surrounding
structures were obtained without and with intravenous contrast.
CONTRAST:  20mL MULTIHANCE GADOBENATE DIMEGLUMINE 529 MG/ML IV SOLN

[Series 2: FLAIR · sagittal · 3.0mm · 0.75mm/px · 2 of 39 slices shown (1 of 2)]
[im 1/39]
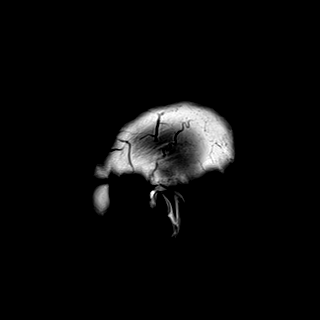
[im 39/39]
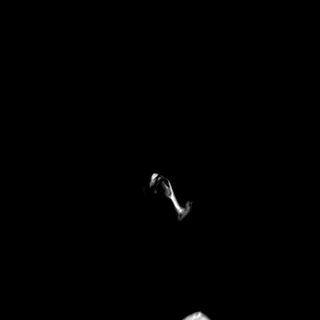

[Series 3: DWI · axial · 3.0mm · 1.50mm/px · z∈[-88,+60]mm · 4 of 78 slices shown (1 of 2)]
[im 1/78]
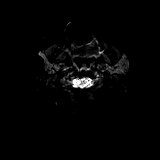
[im 26/78]
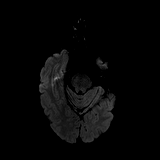
[im 52/78]
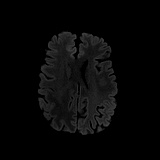
[im 78/78]
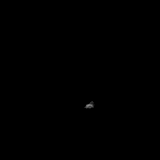

[Series 4: DWI · axial · 3.0mm · 1.50mm/px · z∈[-88,+60]mm · 2 of 39 slices shown (2 of 2)]
[im 1/39]
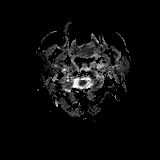
[im 39/39]
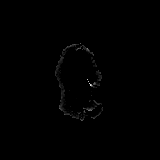

[Series 5: T2 · axial · 5.0mm · 0.57mm/px · 1 of 27 slices shown (1 of 2)]
[im 1/27]
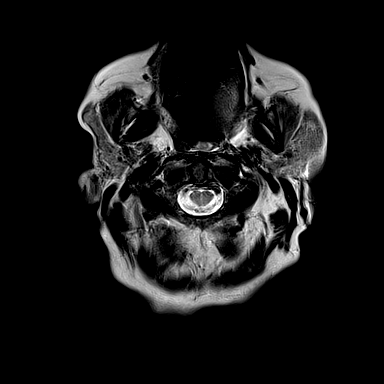

[Series 6: swi_images · axial · 1.5mm · 0.90mm/px · z∈[-87,+67]mm · 5 of 104 slices shown]
[im 1/104]
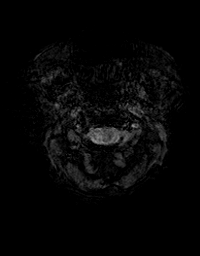
[im 26/104]
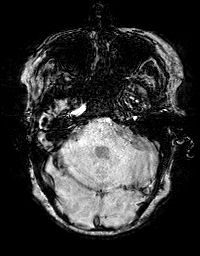
[im 52/104]
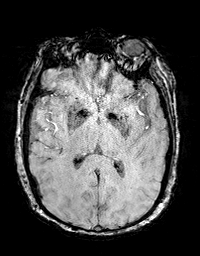
[im 78/104]
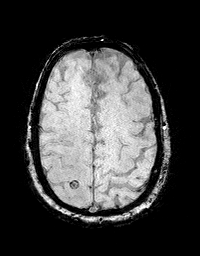
[im 104/104]
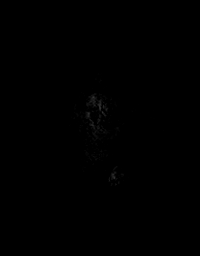

[Series 7: mip_images(sw) · axial · 12.0mm · 0.90mm/px · z∈[-82,+62]mm · 4 of 97 slices shown]
[im 1/97]
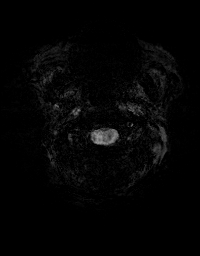
[im 33/97]
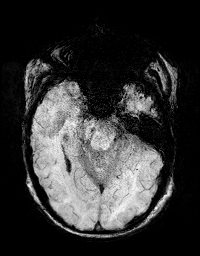
[im 65/97]
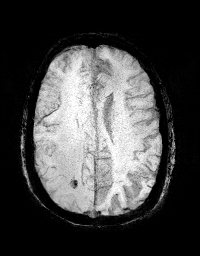
[im 97/97]
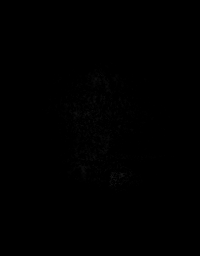

[Series 8: FLAIR · axial · 3.0mm · 0.86mm/px · z∈[-115,+68]mm · 3 of 62 slices shown (2 of 2)]
[im 1/62]
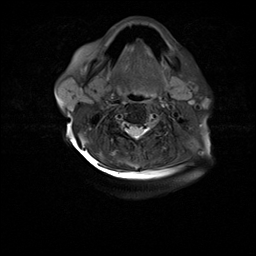
[im 31/62]
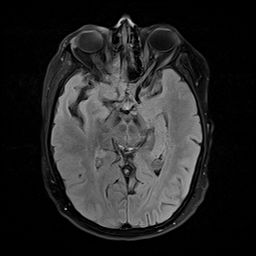
[im 62/62]
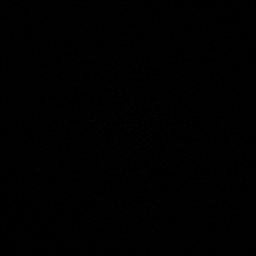

[Series 9: T2 · axial · non-contrast · 1.0mm · 0.86mm/px · z∈[-85,+71]mm · 7 of 160 slices shown (2 of 2)]
[im 1/160]
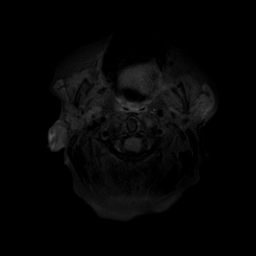
[im 27/160]
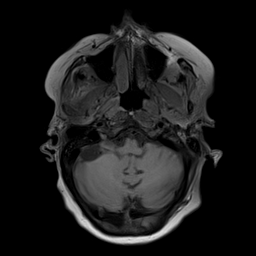
[im 54/160]
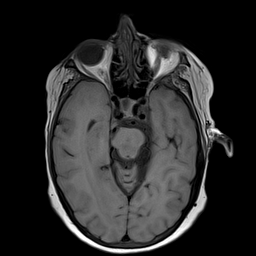
[im 80/160]
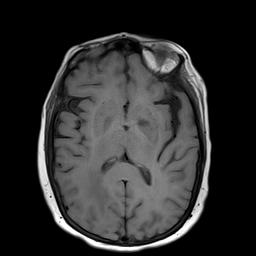
[im 107/160]
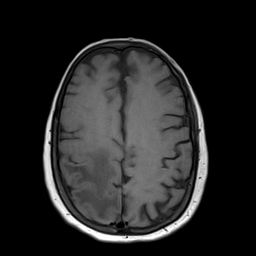
[im 133/160]
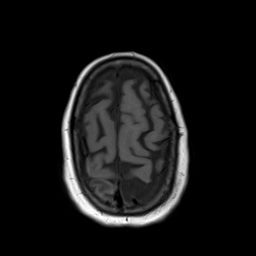
[im 160/160]
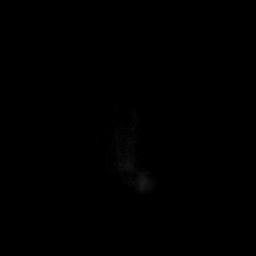

[Series 10: T2 post-contrast · coronal · 3.0mm · 0.57mm/px · 2 of 47 slices shown (1 of 2)]
[im 1/47]
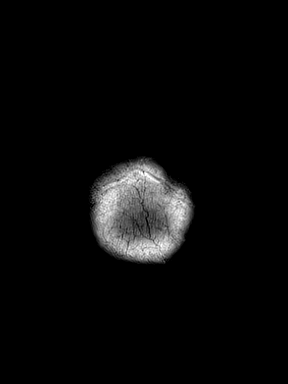
[im 47/47]
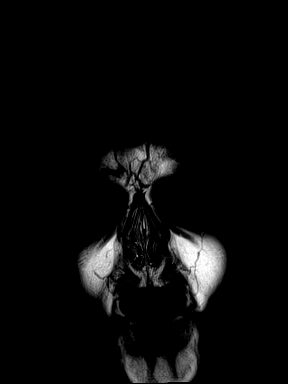

[Series 11: T2 post-contrast · axial · 1.0mm · 0.86mm/px · z∈[-85,+71]mm · 7 of 160 slices shown (2 of 2)]
[im 1/160]
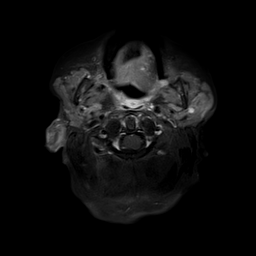
[im 27/160]
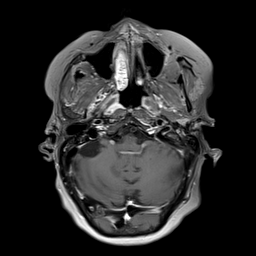
[im 54/160]
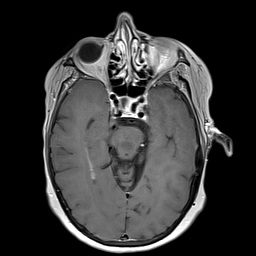
[im 80/160]
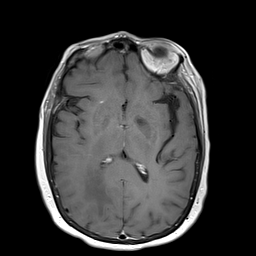
[im 107/160]
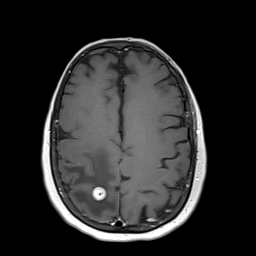
[im 133/160]
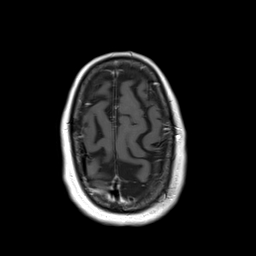
[im 160/160]
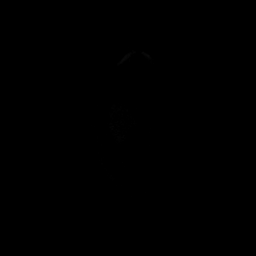

[Series 12: T1 post-contrast · axial · 1.0mm · 0.75mm/px · z∈[-87,+71]mm · 7 of 158 slices shown (1 of 2)]
[im 1/158]
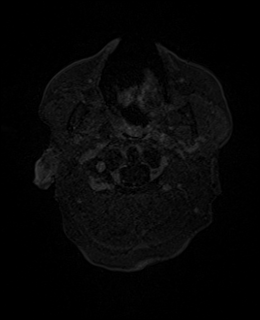
[im 27/158]
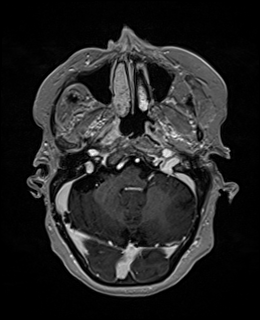
[im 53/158]
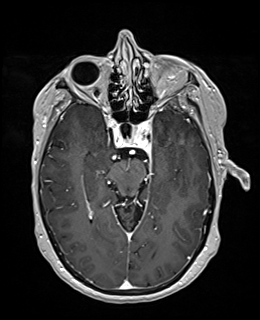
[im 79/158]
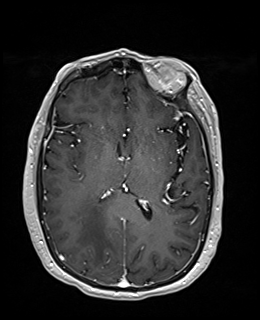
[im 105/158]
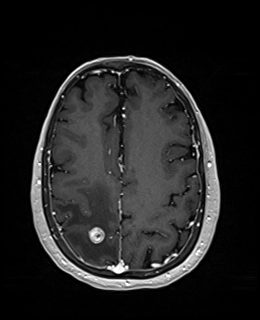
[im 131/158]
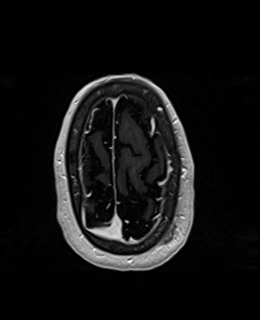
[im 158/158]
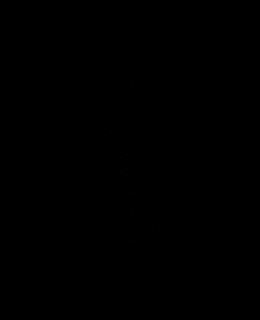

[Series 13: T1 post-contrast · coronal · 3.0mm · 0.57mm/px · 2 of 47 slices shown (2 of 2)]
[im 1/47]
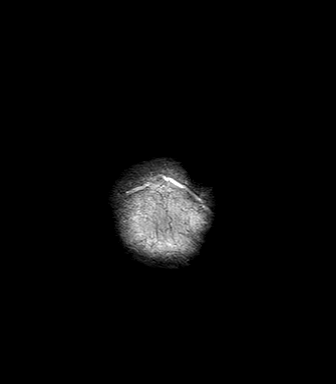
[im 47/47]
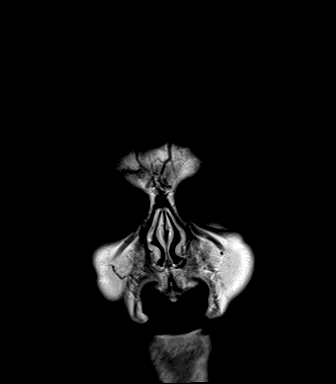

[Series 14: FLAIR post-contrast · sagittal · 3.0mm · 0.75mm/px · 2 of 39 slices shown]
[im 1/39]
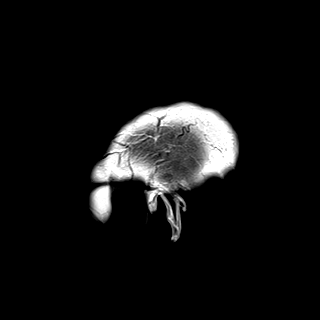
[im 39/39]
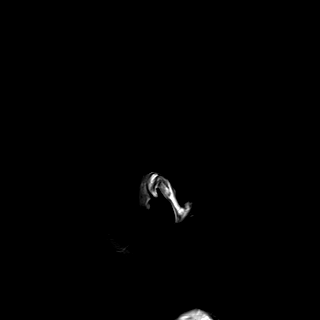

[48 of 48 positions shown; findings below may reference images not displayed]

FINDINGS: The study is mildly motion degraded.

Brain:

Enhancing brain lesions as follows:

1. 1.4 cm in the right parietal lobe with moderate surrounding edema
which has increased from the prior MRI but is similar to the more
recent CT (series 11, image 110).
2. 2 mm in the posterior left frontal lobe (series 11, image 120).
3. 2 mm in the anterior right frontal lobe (series 11, image 112).
4. 5 mm in the more inferior aspect of anterior right frontal lobe
with mild edema (series 11, image 85).
5. 2 mm in the anterior left temporal lobe (series 11, image 57).
6. 6 mm focus of curvilinear enhancement at the anterior aspect of
the right basal ganglia, favor vascular enhancement over a
metastasis (series 11, image 79 and series 14, image 14).

There are chronic blood products associated with some of these
lesions. A few additional foci of chronic microhemorrhage are
present in both cerebral hemispheres and cerebellum without
associated enhancement or edema. No acute infarct, midline shift, or
extra-axial fluid collection is identified. Cerebral volume is
normal. There is no ventriculomegaly. A mildly enlarged partially
empty sella is noted.

Vascular: Major intracranial vascular flow voids are preserved.

Skull and upper cervical spine: No suspicious marrow lesion.

Sinuses/Orbits: Bilateral cataract extraction. Clear paranasal
sinuses. Trace bilateral mastoid effusions.

Other: Persistent small left parietal scalp hematoma.
IMPRESSION: 1. Five enhancing brain lesions consistent with metastases. Moderate
edema associated with the right parietal lesion.
2. Subcentimeter focus of enhancement at the anterior aspect of the
right basal ganglia, favor vascular enhancement over a sixth
metastasis.

## 2021-06-22 MED ORDER — GADOBENATE DIMEGLUMINE 529 MG/ML IV SOLN
20.0000 mL | Freq: Once | INTRAVENOUS | Status: AC | PRN
  Administered 2021-06-22: 20 mL via INTRAVENOUS

## 2021-06-27 ENCOUNTER — Ambulatory Visit
Admission: RE | Admit: 2021-06-27 | Discharge: 2021-06-27 | Disposition: A | Discharge status: Home / Self Care | Payer: Medicare Other | Source: Ambulatory Visit | Attending: Radiation Oncology | Admitting: Radiation Oncology

## 2021-06-27 ENCOUNTER — Other Ambulatory Visit: Payer: Self-pay

## 2021-06-27 DIAGNOSIS — Z510 Encounter for antineoplastic radiation therapy: Secondary | ICD-10-CM | POA: Diagnosis not present

## 2021-06-27 DIAGNOSIS — C561 Malignant neoplasm of right ovary: Secondary | ICD-10-CM | POA: Insufficient documentation

## 2021-06-27 DIAGNOSIS — C7931 Secondary malignant neoplasm of brain: Secondary | ICD-10-CM | POA: Insufficient documentation

## 2021-06-29 DIAGNOSIS — I130 Hypertensive heart and chronic kidney disease with heart failure and stage 1 through stage 4 chronic kidney disease, or unspecified chronic kidney disease: Secondary | ICD-10-CM | POA: Diagnosis not present

## 2021-06-29 DIAGNOSIS — E039 Hypothyroidism, unspecified: Secondary | ICD-10-CM | POA: Diagnosis not present

## 2021-06-29 DIAGNOSIS — I5032 Chronic diastolic (congestive) heart failure: Secondary | ICD-10-CM | POA: Diagnosis not present

## 2021-06-29 DIAGNOSIS — C7931 Secondary malignant neoplasm of brain: Secondary | ICD-10-CM | POA: Diagnosis not present

## 2021-06-29 DIAGNOSIS — Z87891 Personal history of nicotine dependence: Secondary | ICD-10-CM | POA: Diagnosis not present

## 2021-06-29 DIAGNOSIS — I4510 Unspecified right bundle-branch block: Secondary | ICD-10-CM | POA: Diagnosis not present

## 2021-06-29 DIAGNOSIS — D630 Anemia in neoplastic disease: Secondary | ICD-10-CM | POA: Diagnosis not present

## 2021-06-29 DIAGNOSIS — E785 Hyperlipidemia, unspecified: Secondary | ICD-10-CM | POA: Diagnosis not present

## 2021-06-29 DIAGNOSIS — J9611 Chronic respiratory failure with hypoxia: Secondary | ICD-10-CM | POA: Diagnosis not present

## 2021-06-29 DIAGNOSIS — Z9981 Dependence on supplemental oxygen: Secondary | ICD-10-CM | POA: Diagnosis not present

## 2021-06-29 DIAGNOSIS — E440 Moderate protein-calorie malnutrition: Secondary | ICD-10-CM | POA: Diagnosis not present

## 2021-06-29 DIAGNOSIS — D631 Anemia in chronic kidney disease: Secondary | ICD-10-CM | POA: Diagnosis not present

## 2021-06-29 DIAGNOSIS — I471 Supraventricular tachycardia: Secondary | ICD-10-CM | POA: Diagnosis not present

## 2021-06-29 DIAGNOSIS — K219 Gastro-esophageal reflux disease without esophagitis: Secondary | ICD-10-CM | POA: Diagnosis not present

## 2021-06-29 DIAGNOSIS — J449 Chronic obstructive pulmonary disease, unspecified: Secondary | ICD-10-CM | POA: Diagnosis not present

## 2021-06-29 DIAGNOSIS — N184 Chronic kidney disease, stage 4 (severe): Secondary | ICD-10-CM | POA: Diagnosis not present

## 2021-06-29 DIAGNOSIS — Z9181 History of falling: Secondary | ICD-10-CM | POA: Diagnosis not present

## 2021-07-02 DIAGNOSIS — K51011 Ulcerative (chronic) pancolitis with rectal bleeding: Secondary | ICD-10-CM | POA: Diagnosis not present

## 2021-07-03 ENCOUNTER — Ambulatory Visit: Payer: Medicare Other | Admitting: Hematology and Oncology

## 2021-07-03 ENCOUNTER — Ambulatory Visit
Admission: RE | Admit: 2021-07-03 | Discharge: 2021-07-03 | Disposition: A | Discharge status: Home / Self Care | Payer: Medicare Other | Source: Ambulatory Visit | Attending: Radiation Oncology | Admitting: Radiation Oncology

## 2021-07-03 ENCOUNTER — Encounter: Payer: Self-pay | Admitting: Radiation Oncology

## 2021-07-03 ENCOUNTER — Telehealth: Payer: Self-pay | Admitting: Student

## 2021-07-03 DIAGNOSIS — C7931 Secondary malignant neoplasm of brain: Secondary | ICD-10-CM | POA: Diagnosis not present

## 2021-07-03 DIAGNOSIS — Z510 Encounter for antineoplastic radiation therapy: Secondary | ICD-10-CM | POA: Diagnosis not present

## 2021-07-03 NOTE — Telephone Encounter (Signed)
Spoke with patient again regarding the Palliative referral/services and all questions were answered and she was in agreement with scheduling visit.  I have scheduled an In-home Consult for 07/05/21 @ 12:30 PM

## 2021-07-03 NOTE — Progress Notes (Signed)
Mrs. Spina rested with [place] for 30 minutes following her [place] treatment.  Patient denies headache, dizziness, nausea, diplopia or ringing in the ears. Denies fatigue. Patient without complaints. Understands to avoid strenuous activity for the next 24 hours and call [phone] with needs.  Patient was advised to give me a call to discuss her current dosage of Decadron and to review taper instructions.  Gloriajean Dell. [name], BSN

## 2021-07-03 NOTE — Op Note (Signed)
°  Name: Jamie Montoya  MRN: [id number]  Date: 07/03/2021   DOB: 10/17/1945  Stereotactic Radiosurgery Operative Note  PRE-OPERATIVE DIAGNOSIS:  Multiple Brain Metastases  POST-OPERATIVE DIAGNOSIS:  Multiple Brain Metastases  PROCEDURE:  Stereotactic Radiosurgery  SURGEON:  Judith Part, MD  NARRATIVE: The patient underwent a radiation treatment planning session in the radiation oncology simulation suite under the care of the radiation oncology physician and physicist.  I participated closely in the radiation treatment planning afterwards. The patient underwent planning CT which was fused to 3T high resolution MRI with 1 mm axial slices.  These images were fused on the planning system.  We contoured the gross target volumes and subsequently expanded this to yield the Planning Target Volume. I actively participated in the planning process.  I helped to define and review the target contours and also the contours of the optic pathway, eyes, brainstem and selected nearby organs at risk.  All the dose constraints for critical structures were reviewed and compared to AAPM Task Group 101.  The prescription dose conformity was reviewed.  I approved the plan electronically.    Accordingly, Jamie Montoya was brought to the TrueBeam stereotactic radiation treatment linac and placed in the custom immobilization mask.  The patient was aligned according to the IR fiducial markers with BrainLab Exactrac, then orthogonal x-rays were used in ExacTrac with the 6DOF robotic table and the shifts were made to align the patient  Jamie Montoya received stereotactic radiosurgery uneventfully.    Lesions treated:  5   Complex lesions treated:  1 (>3.5 cm, <78m of optic path, or within the brainstem)   The detailed description of the procedure is recorded in the radiation oncology procedure note.  I was present for the duration of the procedure.  DISPOSITION:  Following delivery, the patient was transported to  nursing in stable condition and monitored for possible acute effects to be discharged to home in stable condition with follow-up in one month.  TJudith Part MD 07/03/2021 3:17 PM

## 2021-07-04 ENCOUNTER — Telehealth: Payer: Self-pay | Admitting: Radiation Therapy

## 2021-07-04 ENCOUNTER — Telehealth: Payer: Self-pay | Admitting: *Deleted

## 2021-07-04 DIAGNOSIS — E039 Hypothyroidism, unspecified: Secondary | ICD-10-CM | POA: Diagnosis not present

## 2021-07-04 DIAGNOSIS — N184 Chronic kidney disease, stage 4 (severe): Secondary | ICD-10-CM | POA: Diagnosis not present

## 2021-07-04 NOTE — Telephone Encounter (Signed)
Spoke with Jamie Montoya to verify the patients current dosage of Dexamethasone.  She reported that Jamie Montoya is not taking dexamethasone at this time.  We discussed her upcoming appointments and she verbalized understanding.  Jamie Montoya. [name], BSN

## 2021-07-04 NOTE — Telephone Encounter (Signed)
I called Ms. Henthorn to check in and also to verify what her current steroid dose is so we can give her taper instructions. She is doing great, no issues or complaints since completing the [facility] treatment yesterday. When asked about the steroid dosage, she could not find that medication with her other medications. She does not think that she picked it up after her ED visit on 06/14/21 and has only been taking Keppra for seizure prevention.   I will let Shona Simpson PA-C and Dr. Ida Rogue nurse know that she has not been taking this medication and has not had any new or worsening symptoms since completing the [place] procedure on 07/03/21.   Mont Dutton R.T.(R)(T) Radiation Special Procedures Navigator

## 2021-07-04 NOTE — Progress Notes (Signed)
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  Intent: Palliative  Radiation Treatment Dates: 07/03/2021 through 07/03/2021 Site Technique Total Dose (Gy) Dose per Fx (Gy) Completed Fx Beam Energies  Brain:   PTV_1_Rpariet_98m  PTV_2_LfrontPost_249mPTV_3_RfrontAnt_2m28mPTV_4_RfrontInf_5mm30mV_5_LtempAnt_2mm 81mT 20/20 20 1/1 6XFFF   Narrative: The patient tolerated radiation therapy relatively well. Of note she had been given a prescription for dexamethasone but only took this for a few days since it did not seem to alleviate her left sided weakness, and has not taken any recently.  Plan: The patient will receive a call in about one month from the radiation oncology department. She wanted to discuss her care again with Dr. GorsuAlvy Bimlerugh Dr. GorsuAlvy Bimlernot recommend re-evaluation as she felt the patient was still not a candidate for systemic therapy. She [facility]______________________________________________    AlisoCarola Rhine

## 2021-07-05 ENCOUNTER — Other Ambulatory Visit: Payer: Medicare Other | Admitting: Student

## 2021-07-05 ENCOUNTER — Other Ambulatory Visit: Payer: Self-pay

## 2021-07-05 DIAGNOSIS — C561 Malignant neoplasm of right ovary: Secondary | ICD-10-CM

## 2021-07-05 DIAGNOSIS — Z515 Encounter for palliative care: Secondary | ICD-10-CM | POA: Diagnosis not present

## 2021-07-05 DIAGNOSIS — R531 Weakness: Secondary | ICD-10-CM

## 2021-07-05 DIAGNOSIS — K5900 Constipation, unspecified: Secondary | ICD-10-CM | POA: Diagnosis not present

## 2021-07-05 DIAGNOSIS — I5032 Chronic diastolic (congestive) heart failure: Secondary | ICD-10-CM | POA: Diagnosis not present

## 2021-07-05 NOTE — Progress Notes (Signed)
[facility] Palliative Care Consult Note Telephone: [phone]  Fax: [phone]   Date of encounter: 07/05/21 1:06 PM PATIENT NAME: Jamie Montoya [address] [place] [address]   [phone] (home)  DOB: Dec 13, 1945 MRN: [id number] PRIMARY CARE PROVIDER:    Leonides Sake, MD,  Jamie Montoya [place] [address] [phone]  REFERRING PROVIDER:   Leonides Sake, MD [address] Jamie Montoya,  Jamie Montoya [address] [phone]  RESPONSIBLE PARTY:    Contact Information     Name Relation Home Work Mobile   Two  Niece   [phone]   Jamie Montoya Other   [phone]        I met face to face with patient in the home. Palliative Care was asked to follow this patient by consultation request of  Jamie Montoya, Jamie Mercy, MD to address advance care planning and complex medical decision making. This is the initial visit.                                     ASSESSMENT AND PLAN / RECOMMENDATIONS:   Advance Care Planning/Goals of Care: Goals include to maximize quality of life and symptom management. Patient/health care surrogate gave his/her permission to discuss.Our advance care planning conversation included a discussion about:    The value and importance of advance care planning  Experiences with loved ones who have been seriously ill or have died  Exploration of personal, cultural or spiritual beliefs that might influence medical decisions  Exploration of goals of care in the event of a sudden injury or illness  Identification  of a healthcare agent  Reviewed MOST form today; completed. CODE STATUS: DNR  Education provided on palliative medicine versus hospice services we discussed her advance cancer.  Patient is not eligible for further treatments.  She acknowledges needing more support.  Will discuss with hospice medical director eligibility. Palliative medicine will continue to provide ongoing support.  I spent 20  minutes providing this consultation. More than 50% of the time in this consultation was spent in counseling and care coordination.  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 She is agreeable to being referred back to hospice services.   Constipation-patient would like to defer taking any more medications at this time. She is to add fiber to diet; prunes.   Chronic Diastolic HF-endorses shortness of breath with exertion, sometimes at rest. Continue oxygen at 2 lpm; wears PRN during the day, continuously at night. Education on having sats 92% or greater. Continue torsemide as directed.   Generalized weakness-continued support from caregivers; motorized scooter for locomotion.   Follow up Palliative Care Visit: Palliative care will continue to follow for complex medical decision making, advance care planning, and clarification of goals. Return 4 weeks or prn.   This visit was coded based on medical decision making (MDM).  PPS: 40%  HOSPICE ELIGIBILITY/DIAGNOSIS: TBD  Chief Complaint: Palliative Medicine initial consult.   HISTORY OF PRESENT ILLNESS:  Jamie Montoya is a 76 y.o. 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 Patient with [place] treatment on 07/03/21 to help with seizures. She states she will not receive any further treatments. Patient was discharged from [facility] about 6 months ago per patient.  Patient resides at home. She has a daily caregiver and family friend Jamie Montoya assist with care. She denies pain, nausea. She endorses shortness of breath with exertion. She wears oxygen PRN during the day and routinely at night. She does report  occasional constipation. Endorses a good appetite. She recently receiving Entyvio infusion for her chronic ulcerative colitis. She denies any diarrhea. She uses motorized scooter for locomotion. Home health services discontinued recent hospitalization. She is wanting to see a podiatrist to have her nails trimmed.   History obtained from review of EMR, discussion with primary team, and interview with family, facility staff/caregiver and/or Jamie Montoya.  I reviewed available labs, medications, imaging, studies and related documents from the EMR.  Records reviewed and summarized above.   ROS  General: NAD EYES: denies vision changes ENMT: denies dysphagia Cardiovascular: denies chest pain  Pulmonary: denies cough, SOB with exertion Abdomen: endorses good appetite, +constipation, endorses continence of bowel GU: denies dysuria MSK: +increased weakness,  41flls reported Skin: denies rashes or wounds Neurological: denies pain, denies insomnia Psych: Endorses positive mood Heme/lymph/immuno: denies bruises, abnormal bleeding  Physical Exam: Pulse 62, resp 18, b/p 120/78, sats 97% on room air Constitutional: NAD General: frail appearing, obese  EYES: anicteric sclera, lids intact, no discharge  ENMT: intact hearing, oral mucous membranes moist CV: S1S2, RRR, no LE edema Pulmonary: LCTA, no increased work of breathing, no cough, room air Abdomen: normo-active BS + 4 quadrants, soft and non tender, no ascites GU: deferred MSK: moves all extremities Skin: warm and dry, no rashes or wounds on visible skin Neuro: generalized weakness, A & O x 3 Psych: non-anxious affect, pleasant Hem/lymph/immuno: no widespread bruising CURRENT PROBLEM LIST:  Patient Active Problem List   Diagnosis Date Noted   Brain lesion 05/25/2021   Mild protein-calorie malnutrition ([facility] 05/25/2021   Fall 05/24/2021   Chronic kidney disease (CKD), stage IV (severe) (HCC) 05/04/2020   Right ovarian epithelial cancer  ([facility] 05/04/2020   Anemia due to chronic illness 05/04/2020   Goals of care, counseling/discussion 05/04/2020   Diarrhea of presumed infectious origin    Adnexal mass    Acute respiratory failure with hypoxia ([facility] 11/25/2019   COPD with acute exacerbation (H[facility] [place] 11/25/2019   Thrombocytopenia (H[facility] 11/25/2019   Essential hypertension 11/25/2019   Acute heart failure with normal ejection fraction (HNorth [facility] 07/18/2017   CHF exacerbation ([facility] Oliv[facility] 07/17/2017   Bilateral lower leg cellulitis 12/04/2016   Acute on chronic diastolic heart failure (HCC) 09/18/2016   AKI (acute kidney injury) (HDoctor Phillips 08/26/2016   Paroxysmal SVT (supraventricular tachycardia) (HCC) 08/19/2016   Chronic diastolic CHF (congestive heart failure) (HBrady 08/13/2016   Hearing loss due to cerumen impaction, left 08/04/2016   Hypokalemia 08/01/2016   Chronic venous stasis dermatitis of both lower extremities 07/31/2016   Acute on chronic respiratory failure with hypoxia (HH[facility] 07/30/2016   HLD (hyperlipidemia) 07/30/2016   Hypothyroidism 07/30/2016   Anxiety 07/30/2016   History of colonic polyps    Personal history of adenomatous colonic polyps 11/13/2011   GERD with stricture 09/20/2010   Obesity, Class III, BMI 40-49.9 (morbid obesity) (HGos[facility] 09/06/2010   COPD (chronic obstructive pulmonary disease) (HNew [place] 09/06/2010   Ulcerative colitis, chronic (HFollett 09/06/2010   PAST MEDICAL HISTORY:  Active Ambulatory Problems    Diagnosis Date Noted   Obesity, Class III, BMI 40-49.9 (morbid obesity) (HF[facility] Springs 09/06/2010   COPD (chronic obstructive pulmonary disease) (HPatrick 09/06/2010   Ulcerative colitis, chronic (HNorth Great River 09/06/2010   GERD with stricture 09/20/2010   Personal history  of adenomatous colonic polyps 11/13/2011   History of colonic polyps    Acute on chronic respiratory failure with hypoxia ([place]) 07/30/2016   HLD (hyperlipidemia) 07/30/2016   Hypothyroidism 07/30/2016   Anxiety 07/30/2016   Chronic venous stasis dermatitis of  both lower extremities 07/31/2016   Hypokalemia 08/01/2016   Hearing loss due to cerumen impaction, left 08/04/2016   Chronic diastolic CHF (congestive heart failure) ([place]) 08/13/2016   Paroxysmal SVT (supraventricular tachycardia) ([place]) 08/19/2016   AKI (acute kidney injury) ([place]) 08/26/2016   Acute on chronic diastolic heart failure ([place]) 09/18/2016   Bilateral lower leg cellulitis 12/04/2016   CHF exacerbation ([place]) 07/17/2017   Acute heart failure with normal ejection fraction ([place]) 07/18/2017   Acute respiratory failure with hypoxia ([place]) 11/25/2019   COPD with acute exacerbation ([place]) 11/25/2019   Thrombocytopenia ([place]) 11/25/2019   Essential hypertension 11/25/2019   Diarrhea of presumed infectious origin    Adnexal mass    Chronic kidney disease (CKD), stage IV (severe) ([place]) 05/04/2020   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[place]) 05/04/2020   Anemia due to chronic illness 05/04/2020   Goals of care, counseling/discussion 05/04/2020   Fall 05/24/2021   Brain lesion 05/25/2021   Mild protein-calorie malnutrition ([place]) 05/25/2021   Resolved Ambulatory Problems    Diagnosis Date Noted   Stricture and stenosis of esophagus 09/06/2010   Benign neoplasm of rectum    Benign neoplasm of transverse colon    Benign neoplasm of cecum    CHF exacerbation ([place]) 07/30/2016   Past Medical History:  Diagnosis Date   Cancer ([place])    Cataract    Chronic diarrhea    Chronic edema    Chronic respiratory failure (HCC)    Esophageal stricture    GERD (gastroesophageal reflux disease) 09/10/2010   Hiatal hernia 09/10/2010   Hyperlipidemia    Hypoxia 11/2019   Morbid obesity ([place])    Oxygen deficiency    Ulcerative colitis, universal ([place]) 09/10/2010   SOCIAL HX:  Social History   Tobacco Use   Smoking status: Former    Types: Cigarettes    Quit date: 06/04/2003    Years since quitting: 18.0   Smokeless tobacco: Never  Substance Use Topics   Alcohol use: No    Alcohol/week: 0.0  standard drinks    Comment: occasional ,very rare   FAMILY HX:  Family History  Problem Relation Age of Onset   Heart disease Father    Peripheral vascular disease Father    Esophageal cancer Mother    Colon cancer Neg Hx       ALLERGIES:  Allergies  Allergen Reactions   Tape Rash     PERTINENT MEDICATIONS:  Outpatient Encounter Medications as of 07/05/2021  Medication Sig   albuterol (PROVENTIL) (2.5 MG/3ML) 0.083% nebulizer solution Take 3 mLs by nebulization 4 (four) times daily as needed for wheezing or shortness of breath.  (Patient not taking: Reported on 06/14/2021)   Biotin 5000 MCG TABS Take 5,000 mcg by mouth in the morning.   Cholecalciferol (VITAMIN D3) 125 MCG (5000 UT) CAPS Take 5,000 Units by mouth daily.   dexamethasone (DECADRON) 0.5 MG tablet Take 4 tablets (2 mg total) by mouth 2 (two) times daily.   levETIRAcetam (KEPPRA) 500 MG tablet Take 1 tablet (500 mg total) by mouth 2 (two) times daily.   levothyroxine (SYNTHROID) 150 MCG tablet Take 150 mcg by mouth daily.   LORazepam (ATIVAN) 0.5 MG tablet 1 tab po 30 minutes prior to radiation or MRI  scans (Patient not taking: Reported on 06/14/2021)   nystatin (MYCOSTATIN/NYSTOP) powder Apply 1 application topically 2 (two) times daily as needed (for itching under stomach fold and groin area).   torsemide (DEMADEX) 20 MG tablet Take 1 tablet (20 mg total) by mouth every morning. (Patient taking differently: Take 20 mg by mouth daily.)   No facility-administered encounter medications on file as of 07/05/2021.   Thank you for the opportunity to participate in the care of Jamie Montoya.  The palliative care team will continue to follow. Please call our office at [phone] if we can be of additional assistance.   Ezekiel Slocumb, NP   COVID-19 PATIENT SCREENING TOOL Asked and negative response unless otherwise noted:  Have you had symptoms of covid, tested positive or been in contact with someone with symptoms/positive  test in the past 5-10 days? No

## 2021-07-06 ENCOUNTER — Telehealth: Payer: Self-pay | Admitting: Student

## 2021-07-06 NOTE — Telephone Encounter (Signed)
Palliative NP spoke with PCP office to request hospice order and to see if  Dr. Lisbeth Ply will serve as hospice attending. Awaiting return call.

## 2021-07-10 NOTE — Progress Notes (Signed)
[facility] Palliative Care Consult Note Telephone: [phone]  Fax: [phone]   Date of encounter: 07/05/21 1:06 PM PATIENT NAME: Jamie Montoya [address] [place] [address]   [phone] (home)  DOB: 1946-04-21 MRN: [id number] PRIMARY CARE PROVIDER:    Leonides Sake, Mont[place],  [place] [address] [phone]  REFERRING PROVIDER:   Leonides Sake, Montoya [address] [place],  [place] [address] [phone]  RESPONSIBLE PARTY:    Contact Information     Name Relation Home Work Mobile   Jamie Montoya   [phone]   kara,kubala-caretaker Other   [phone]        I met face to face with patient in the home. Palliative Care was asked to follow this patient by consultation request of  Jamie Montoya to address advance care planning and complex medical decision making. This is the initial visit.                                     ASSESSMENT AND PLAN / RECOMMENDATIONS:   Advance Care Planning/Goals of Care: Goals include to maximize quality of life and symptom management. Patient/health care surrogate gave his/her permission to discuss.Our advance care planning conversation included a discussion about:    The value and importance of advance care planning  Experiences with loved ones who have been seriously ill or have died  Exploration of personal, cultural or spiritual beliefs that might influence medical decisions  Exploration of goals of care in the event of a sudden injury or illness  Identification  of a healthcare agent  Reviewed [facility] form today; completed. CODE STATUS: DNR  Education provided on palliative medicine versus hospice services we discussed her advance cancer.  Patient is not eligible for further treatments.  She acknowledges needing more support.  Will discuss with hospice medical director eligibility. Palliative medicine will continue to provide ongoing support.  I spent 20  minutes providing this consultation. More than 50% of the time in this consultation was spent in counseling and care coordination.  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 She is agreeable to being referred back to hospice services.   Constipation-patient would like to defer taking any more medications at this time. She is to add fiber to diet; prunes.   Chronic diastolic HF-endorses shortness of breath with exertion, sometimes at rest. Continue oxygen at 2 lpm; wears PRN during the day, continuously at night. Education on having sats 92% or greater. Continue torsemide as directed.   Generalized weakness-continued support from caregivers; motorized scooter for locomotion.   Follow up Palliative Care Visit: Palliative care will continue to follow for complex medical decision making, advance care planning, and clarification of goals. Return 4 weeks or prn.   This visit was coded based on medical decision making (MDM).  PPS: 40%  HOSPICE ELIGIBILITY/DIAGNOSIS: Ovarian Cancer with brain mets.  Chief Complaint: Palliative Medicine initial consult.   HISTORY OF PRESENT ILLNESS:  Jamie Montoya is a 76 y.o. 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 Patient last received SRS treatment on 07/03/21 to help with seizures. Patient states she will not receive any further treatments. She was last discharged from [facility] around  6 months ago per patient.   Patient resides at home; she has daily caregiver and family friend Jamie Montoya that helps out with daily care. Home health ended since she was last hospitalized. She denies pain, nausea. She has shortness of  breath with exertion; wears oxygen PRN during the day, routinely at night at 2 lpm. She endorses occasional constipation; no longer having loose stools. She recently receiving [facility] infusion for her ulcerative colitis. She is wanting to see a podiatrist due to needing her nails trimmed.    History obtained from review of EMR, discussion with primary team, and interview with family, facility staff/caregiver and/or Jamie [facility]oya.  I reviewed available labs, medications, imaging, studies and related documents from the EMR.  Records reviewed and summarized above.   ROS  General: NAD EYES: denies vision changes ENMT: denies dysphagia Cardiovascular: denies chest pain Pulmonary: denies cough, SOB with exertion Abdomen: endorses good appetite, +constipation GU: denies dysuria MSK: + weakness,  2 falls reported Skin: denies rashes or wounds Neurological: denies pain, denies insomnia Psych: Endorses stable mood Heme/lymph/immuno: denies bruises, abnormal bleeding  Physical Exam:  Pulse 62, resp 18, b/p 120/78, sats 97% on room air Constitutional: NAD General: frail appearing, obese  EYES: anicteric sclera, lids intact, no discharge  ENMT: intact hearing, oral mucous membranes moist, dentition intact CV: S1S2, RRR, no LE edema Pulmonary: LCTA, no increased work of breathing, no cough, room air Abdomen: normo-active BS + 4 quadrants, soft and non tender, no ascites GU: deferred MSK: moves all extremities Skin: warm and dry, no rashes or wounds on visible skin Neuro: generalized weakness, A & O x 3 Psych: non-anxious affect, pleasant Hem/lymph/immuno: no widespread bruising CURRENT PROBLEM LIST:  Patient Active Problem List   Diagnosis Date Noted   Brain lesion 05/25/2021   Mild protein-calorie malnutrition ([place]) 05/25/2021   Fall 05/24/2021   Chronic kidney disease (CKD), stage IV (severe) (HCC) 05/04/2020   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[place]) 05/04/2020   Anemia due to chronic  illness 05/04/2020   Goals of care, counseling/discussion 05/04/2020   Diarrhea of presumed infectious origin    Adnexal mass    Acute respiratory failure with hypoxia ([place]) 11/25/2019   COPD with acute exacerbation ([place]) 11/25/2019   Thrombocytopenia ([place]) 11/25/2019   Essential hypertension 11/25/2019   Acute heart failure with normal ejection fraction ([place]) 07/18/2017   CHF exacerbation ([place]) 07/17/2017   Bilateral lower leg cellulitis 12/04/2016   Acute on chronic diastolic heart failure (HCC) 09/18/2016   AKI (acute kidney injury) ([place]) 08/26/2016   Paroxysmal SVT (supraventricular tachycardia) (HCC) 08/19/2016   Chronic diastolic CHF (congestive heart failure) ([place]) 08/13/2016   Hearing loss due to cerumen impaction, left 08/04/2016   Hypokalemia 08/01/2016   Chronic venous stasis dermatitis of both lower extremities 07/31/2016   Acute on chronic respiratory failure with hypoxia ([place]) 07/30/2016   HLD (hyperlipidemia) 07/30/2016   Hypothyroidism 07/30/2016   Anxiety 07/30/2016   History of colonic polyps    Personal history of adenomatous colonic polyps 11/13/2011   GERD with stricture 09/20/2010   Obesity, Class III, BMI 40-49.9 (morbid obesity) ([place]) 09/06/2010   COPD (chronic obstructive pulmonary disease) ([place]) 09/06/2010   Ulcerative colitis, chronic ([place]) 09/06/2010   PAST MEDICAL HISTORY:  Active Ambulatory Problems    Diagnosis Date Noted   Obesity, Class III, BMI 40-49.9 (morbid obesity) ([place]) 09/06/2010   COPD (chronic obstructive pulmonary disease) ([place]) 09/06/2010   Ulcerative colitis, chronic ([place]) 09/06/2010   GERD with stricture 09/20/2010  Personal history of adenomatous colonic polyps 11/13/2011   History of colonic polyps    Acute on chronic respiratory failure with hypoxia (HCC) 07/30/2016   HLD (hyperlipidemia) 07/30/2016   Hypothyroidism 07/30/2016   Anxiety 07/30/2016   Chronic venous stasis dermatitis of both lower extremities 07/31/2016    Hypokalemia 08/01/2016   Hearing loss due to cerumen impaction, left 08/04/2016   Chronic diastolic CHF (congestive heart failure) ([place]) 08/13/2016   Paroxysmal SVT (supraventricular tachycardia) ([place]) 08/19/2016   AKI (acute kidney injury) ([place]) 08/26/2016   Acute on chronic diastolic heart failure ([place]) 09/18/2016   Bilateral lower leg cellulitis 12/04/2016   CHF exacerbation ([place]) 07/17/2017   Acute heart failure with normal ejection fraction ([place]) 07/18/2017   Acute respiratory failure with hypoxia ([place]) 11/25/2019   COPD with acute exacerbation ([place]) 11/25/2019   Thrombocytopenia ([place]) 11/25/2019   Essential hypertension 11/25/2019   Diarrhea of presumed infectious origin    Adnexal mass    Chronic kidney disease (CKD), stage IV (severe) ([place]) 05/04/2020   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[place]) 05/04/2020   Anemia due to chronic illness 05/04/2020   Goals of care, counseling/discussion 05/04/2020   Fall 05/24/2021   Brain lesion 05/25/2021   Mild protein-calorie malnutrition ([employer]) 05/25/2021   Resolved Ambulatory Problems    Diagnosis Date Noted   Stricture and stenosis of esophagus 09/06/2010   Benign neoplasm of rectum    Benign neoplasm of transverse colon    Benign neoplasm of cecum    CHF exacerbation ([place]) 07/30/2016   Past Medical History:  Diagnosis Date   Cancer ([place])    Cataract    Chronic diarrhea    Chronic edema    Chronic respiratory failure (HCC)    Esophageal stricture    GERD (gastroesophageal reflux disease) 09/10/2010   Hiatal hernia 09/10/2010   Hyperlipidemia    Hypoxia 11/2019   Morbid obesity ([place])    Oxygen deficiency    Ulcerative colitis, universal ([place]) 09/10/2010   SOCIAL HX:  Social History   Tobacco Use   Smoking status: Former    Types: Cigarettes    Quit date: 06/04/2003    Years since quitting: 18.0   Smokeless tobacco: Never  Substance Use Topics   Alcohol use: No    Alcohol/week: 0.0 standard drinks    Comment:  occasional ,very rare   FAMILY HX:  Family History  Problem Relation Age of Onset   Heart disease Father    Peripheral vascular disease Father    Esophageal cancer Mother    Colon cancer Neg Hx       ALLERGIES:  Allergies  Allergen Reactions   Tape Rash     PERTINENT MEDICATIONS:  Outpatient Encounter Medications as of 07/05/2021  Medication Sig   albuterol (PROVENTIL) (2.5 MG/3ML) 0.083% nebulizer solution Take 3 mLs by nebulization 4 (four) times daily as needed for wheezing or shortness of breath.  (Patient not taking: Reported on 06/14/2021)   Biotin 5000 MCG TABS Take 5,000 mcg by mouth in the morning.   Cholecalciferol (VITAMIN D3) 125 MCG (5000 UT) CAPS Take 5,000 Units by mouth daily.   dexamethasone (DECADRON) 0.5 MG tablet Take 4 tablets (2 mg total) by mouth 2 (two) times daily.   levETIRAcetam (KEPPRA) 500 MG tablet Take 1 tablet (500 mg total) by mouth 2 (two) times daily.   levothyroxine (SYNTHROID) 150 MCG tablet Take 150 mcg by mouth daily.   LORazepam (ATIVAN) 0.5 MG tablet 1 tab po 30 minutes prior to radiation  or MRI scans (Patient not taking: Reported on 06/14/2021)   nystatin (MYCOSTATIN/NYSTOP) powder Apply 1 application topically 2 (two) times daily as needed (for itching under stomach fold and groin area).   torsemide (DEMADEX) 20 MG tablet Take 1 tablet (20 mg total) by mouth every morning. (Patient taking differently: Take 20 mg by mouth daily.)   No facility-administered encounter medications on file as of 07/05/2021.   Thank you for the opportunity to participate in the care of Ms. Florance.  The palliative care team will continue to follow. Please call our office at [phone] if we can be of additional assistance.   Ezekiel Slocumb, NP   COVID-19 PATIENT SCREENING TOOL Asked and negative response unless otherwise noted:  Have you had symptoms of covid, tested positive or been in contact with someone with symptoms/positive test in the past 5-10 days?  No

## 2021-07-12 ENCOUNTER — Telehealth: Payer: Self-pay

## 2021-07-12 NOTE — Telephone Encounter (Signed)
Podiatry referral sent to [facility] on 07/10/21. Follow up message sent to request receipt of referral and confirm that patient can be seen

## 2021-07-13 DIAGNOSIS — M2062 Acquired deformities of toe(s), unspecified, left foot: Secondary | ICD-10-CM | POA: Diagnosis not present

## 2021-07-13 DIAGNOSIS — C7931 Secondary malignant neoplasm of brain: Secondary | ICD-10-CM | POA: Diagnosis not present

## 2021-08-06 ENCOUNTER — Ambulatory Visit
Admission: RE | Admit: 2021-08-06 | Discharge: 2021-08-06 | Disposition: A | Discharge status: Home / Self Care | Source: Ambulatory Visit | Attending: Radiation Oncology | Admitting: Radiation Oncology

## 2021-08-06 DIAGNOSIS — G939 Disorder of brain, unspecified: Secondary | ICD-10-CM

## 2021-08-06 NOTE — Progress Notes (Signed)
Pt not contacted for one month follow up due to enrollment in hospice care.

## 2021-08-10 ENCOUNTER — Other Ambulatory Visit: Payer: Self-pay | Admitting: *Deleted

## 2021-08-10 MED ORDER — TORSEMIDE 20 MG PO TABS: 20.0000 mg | ORAL_TABLET | Freq: Every morning | ORAL | 0 refills | Status: DC

## 2021-09-03 ENCOUNTER — Other Ambulatory Visit: Payer: Self-pay | Admitting: *Deleted

## 2021-09-03 MED ORDER — TORSEMIDE 20 MG PO TABS: 20.0000 mg | ORAL_TABLET | Freq: Every morning | ORAL | 0 refills | Status: DC

## 2021-10-08 DIAGNOSIS — N184 Chronic kidney disease, stage 4 (severe): Secondary | ICD-10-CM | POA: Diagnosis not present

## 2021-10-23 DIAGNOSIS — A498 Other bacterial infections of unspecified site: Secondary | ICD-10-CM | POA: Diagnosis not present

## 2021-10-23 DIAGNOSIS — K51018 Ulcerative (chronic) pancolitis with other complication: Secondary | ICD-10-CM | POA: Diagnosis not present

## 2021-10-25 ENCOUNTER — Emergency Department (HOSPITAL_COMMUNITY)

## 2021-10-25 ENCOUNTER — Emergency Department (HOSPITAL_COMMUNITY)
Admission: EM | Admit: 2021-10-25 | Discharge: 2021-10-25 | Disposition: A | Discharge status: Home / Self Care | Attending: Student | Admitting: Student

## 2021-10-25 ENCOUNTER — Other Ambulatory Visit: Payer: Self-pay

## 2021-10-25 ENCOUNTER — Encounter (HOSPITAL_COMMUNITY): Payer: Self-pay | Admitting: Emergency Medicine

## 2021-10-25 DIAGNOSIS — D72819 Decreased white blood cell count, unspecified: Secondary | ICD-10-CM | POA: Diagnosis not present

## 2021-10-25 DIAGNOSIS — N184 Chronic kidney disease, stage 4 (severe): Secondary | ICD-10-CM | POA: Insufficient documentation

## 2021-10-25 DIAGNOSIS — Z79899 Other long term (current) drug therapy: Secondary | ICD-10-CM | POA: Insufficient documentation

## 2021-10-25 DIAGNOSIS — J441 Chronic obstructive pulmonary disease with (acute) exacerbation: Secondary | ICD-10-CM | POA: Diagnosis not present

## 2021-10-25 DIAGNOSIS — I5032 Chronic diastolic (congestive) heart failure: Secondary | ICD-10-CM | POA: Diagnosis not present

## 2021-10-25 DIAGNOSIS — R0602 Shortness of breath: Secondary | ICD-10-CM | POA: Diagnosis not present

## 2021-10-25 DIAGNOSIS — I130 Hypertensive heart and chronic kidney disease with heart failure and stage 1 through stage 4 chronic kidney disease, or unspecified chronic kidney disease: Secondary | ICD-10-CM | POA: Diagnosis not present

## 2021-10-25 DIAGNOSIS — Z87891 Personal history of nicotine dependence: Secondary | ICD-10-CM | POA: Insufficient documentation

## 2021-10-25 DIAGNOSIS — D696 Thrombocytopenia, unspecified: Secondary | ICD-10-CM | POA: Diagnosis not present

## 2021-10-25 DIAGNOSIS — R569 Unspecified convulsions: Secondary | ICD-10-CM | POA: Insufficient documentation

## 2021-10-25 DIAGNOSIS — Z8542 Personal history of malignant neoplasm of other parts of uterus: Secondary | ICD-10-CM | POA: Diagnosis not present

## 2021-10-25 DIAGNOSIS — E039 Hypothyroidism, unspecified: Secondary | ICD-10-CM | POA: Insufficient documentation

## 2021-10-25 DIAGNOSIS — D649 Anemia, unspecified: Secondary | ICD-10-CM | POA: Insufficient documentation

## 2021-10-25 DIAGNOSIS — C7931 Secondary malignant neoplasm of brain: Secondary | ICD-10-CM

## 2021-10-25 DIAGNOSIS — Z7401 Bed confinement status: Secondary | ICD-10-CM | POA: Diagnosis not present

## 2021-10-25 DIAGNOSIS — R059 Cough, unspecified: Secondary | ICD-10-CM | POA: Diagnosis not present

## 2021-10-25 DIAGNOSIS — G9389 Other specified disorders of brain: Secondary | ICD-10-CM | POA: Diagnosis not present

## 2021-10-25 DIAGNOSIS — M25511 Pain in right shoulder: Secondary | ICD-10-CM | POA: Diagnosis not present

## 2021-10-25 DIAGNOSIS — R6889 Other general symptoms and signs: Secondary | ICD-10-CM | POA: Diagnosis not present

## 2021-10-25 DIAGNOSIS — M2550 Pain in unspecified joint: Secondary | ICD-10-CM | POA: Diagnosis not present

## 2021-10-25 DIAGNOSIS — Z743 Need for continuous supervision: Secondary | ICD-10-CM | POA: Diagnosis not present

## 2021-10-25 DIAGNOSIS — Z7189 Other specified counseling: Secondary | ICD-10-CM

## 2021-10-25 DIAGNOSIS — R0902 Hypoxemia: Secondary | ICD-10-CM | POA: Diagnosis not present

## 2021-10-25 DIAGNOSIS — R531 Weakness: Secondary | ICD-10-CM | POA: Diagnosis not present

## 2021-10-25 LAB — BASIC METABOLIC PANEL
Anion gap: 10 (ref 5–15)
BUN: 42 mg/dL — ABNORMAL HIGH (ref 8–23)
CO2: 24 mmol/L (ref 22–32)
Calcium: 8.9 mg/dL (ref 8.9–10.3)
Chloride: 98 mmol/L (ref 98–111)
Creatinine, Ser: 1.7 mg/dL — ABNORMAL HIGH (ref 0.44–1.00)
GFR, Estimated: 31 mL/min — ABNORMAL LOW (ref >60)
Glucose, Bld: 90 mg/dL (ref 70–99)
Potassium: 3.7 mmol/L (ref 3.5–5.1)
Sodium: 132 mmol/L — ABNORMAL LOW (ref 135–145)

## 2021-10-25 LAB — CBC WITH DIFFERENTIAL/PLATELET
Abs Immature Granulocytes: 0.02 10*3/uL (ref 0.00–0.07)
Basophils Absolute: 0 10*3/uL (ref 0.0–0.1)
Basophils Relative: 0 %
Eosinophils Absolute: 0 10*3/uL (ref 0.0–0.5)
Eosinophils Relative: 1 %
HCT: 26.4 % — ABNORMAL LOW (ref 36.0–46.0)
Hemoglobin: 8.4 g/dL — ABNORMAL LOW (ref 12.0–15.0)
Immature Granulocytes: 1 %
Lymphocytes Relative: 20 %
Lymphs Abs: 0.5 10*3/uL — ABNORMAL LOW (ref 0.7–4.0)
MCH: 31.9 pg (ref 26.0–34.0)
MCHC: 31.8 g/dL (ref 30.0–36.0)
MCV: 100.4 fL — ABNORMAL HIGH (ref 80.0–100.0)
Monocytes Absolute: 0.2 10*3/uL (ref 0.1–1.0)
Monocytes Relative: 8 %
Neutro Abs: 1.6 10*3/uL — ABNORMAL LOW (ref 1.7–7.7)
Neutrophils Relative %: 70 %
Platelets: 72 10*3/uL — ABNORMAL LOW (ref 150–400)
RBC: 2.63 MIL/uL — ABNORMAL LOW (ref 3.87–5.11)
RDW: 13.8 % (ref 11.5–15.5)
WBC: 2.3 10*3/uL — ABNORMAL LOW (ref 4.0–10.5)
nRBC: 0 % (ref 0.0–0.2)

## 2021-10-25 LAB — CBG MONITORING, ED
Glucose-Capillary: 89 mg/dL (ref 70–99)
Glucose-Capillary: 90 mg/dL (ref 70–99)

## 2021-10-25 LAB — TSH: TSH: 0.095 u[IU]/mL — ABNORMAL LOW (ref 0.350–4.500)

## 2021-10-25 LAB — T4, FREE: Free T4: 3.98 ng/dL — ABNORMAL HIGH (ref 0.61–1.12)

## 2021-10-25 IMAGING — CR DG CHEST 2V
2 series · 2 of 2 positions shown · non-contrast
Comparison: Chest x-ray [DATE].

CLINICAL DATA: Shortness of breath.

EXAM:
CHEST - 2 VIEW

[chest lat]
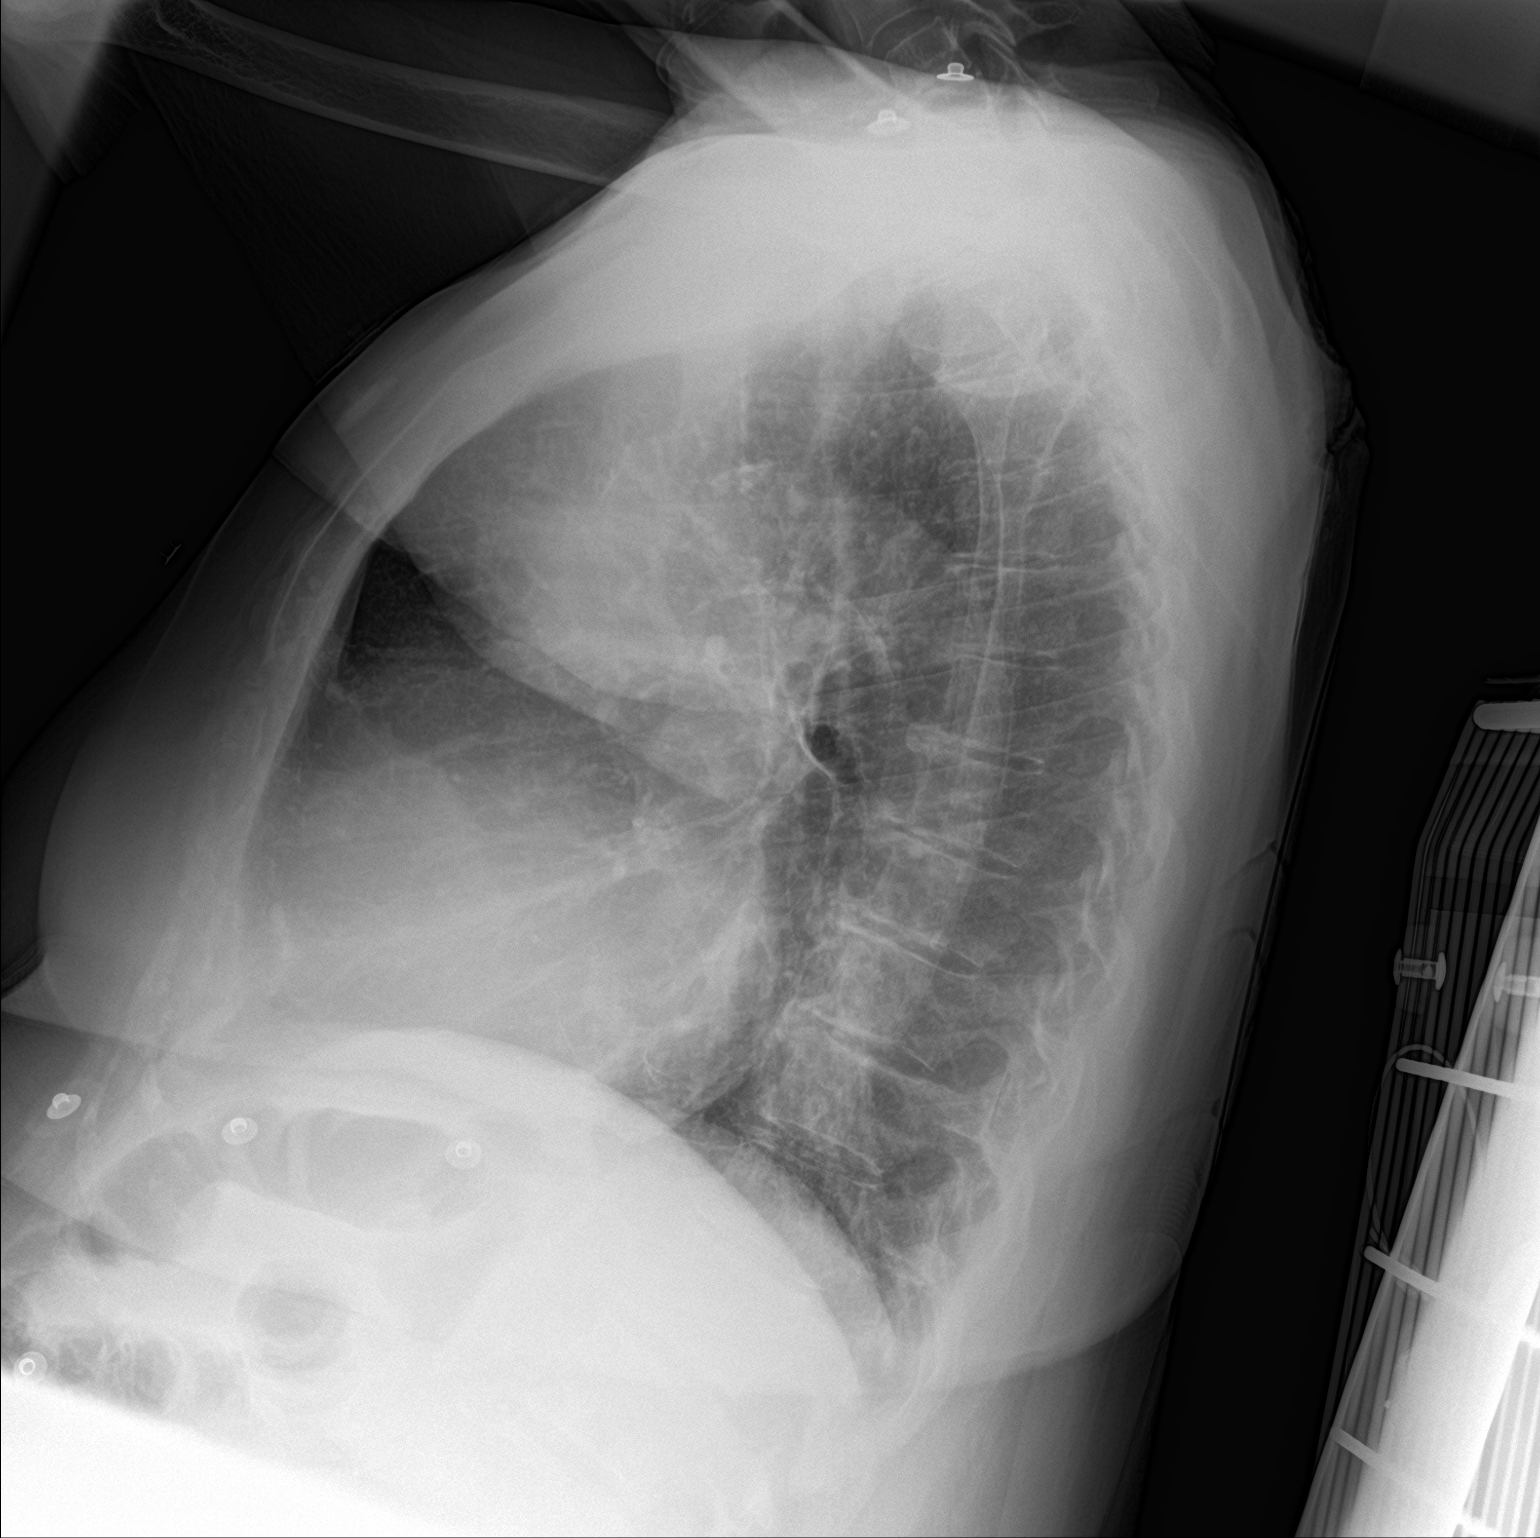

[chest ap]
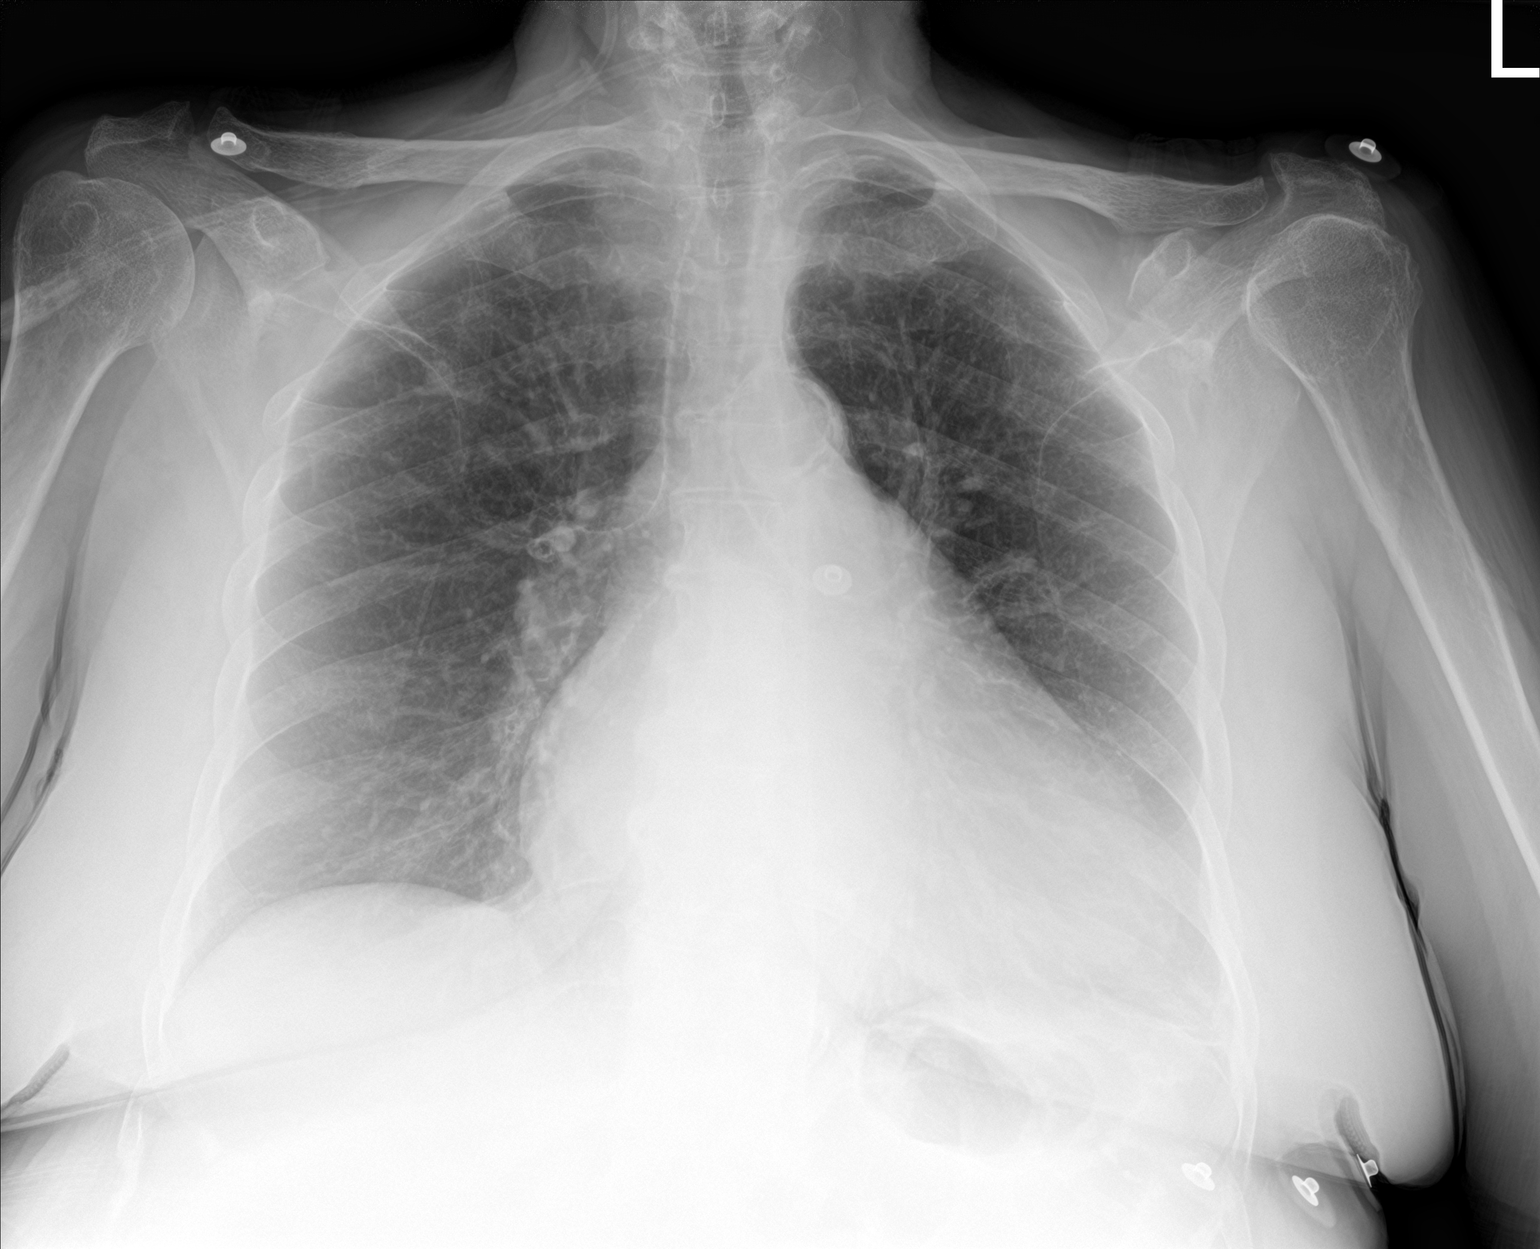

[2 of 2 positions shown; findings below may reference images not displayed]

FINDINGS: Cardiac silhouette is enlarged, unchanged. There is a nodular
density projecting over the right upper lobe measuring 9 mm. The
lungs are otherwise clear. There is no pleural effusion or
pneumothorax. No acute fractures are seen.
IMPRESSION: 1. Stable cardiomegaly.
2. No evidence for pneumonia or edema.
3. New nodular density in the right upper lobe. Recommend further
evaluation with chest CT. This can be performed non emergently.

## 2021-10-25 IMAGING — CR DG SHOULDER 2+V*R*
2 series · 2 of 2 positions shown · non-contrast
Comparison: None Available.

CLINICAL DATA: Right shoulder pain.

EXAM:
RIGHT SHOULDER - 2+ VIEW

[shoulder grashey]
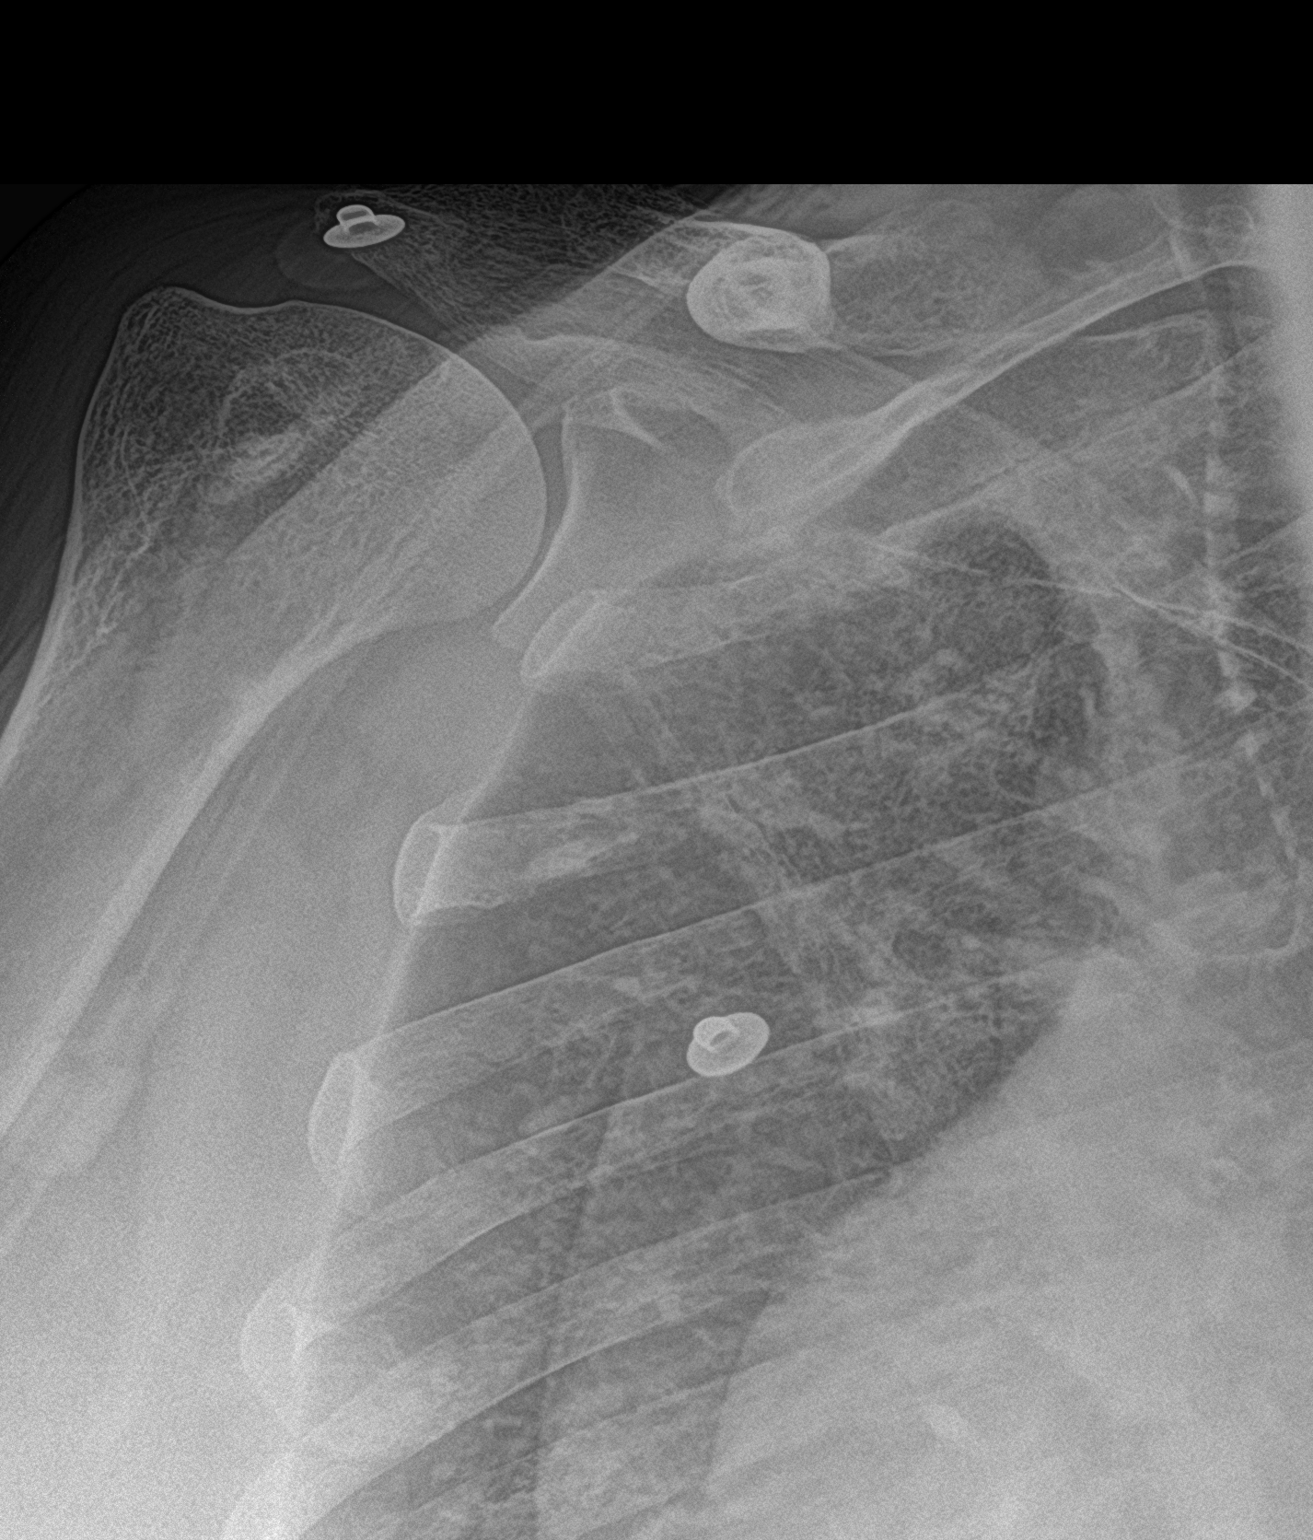

[shoulder ap neutral]
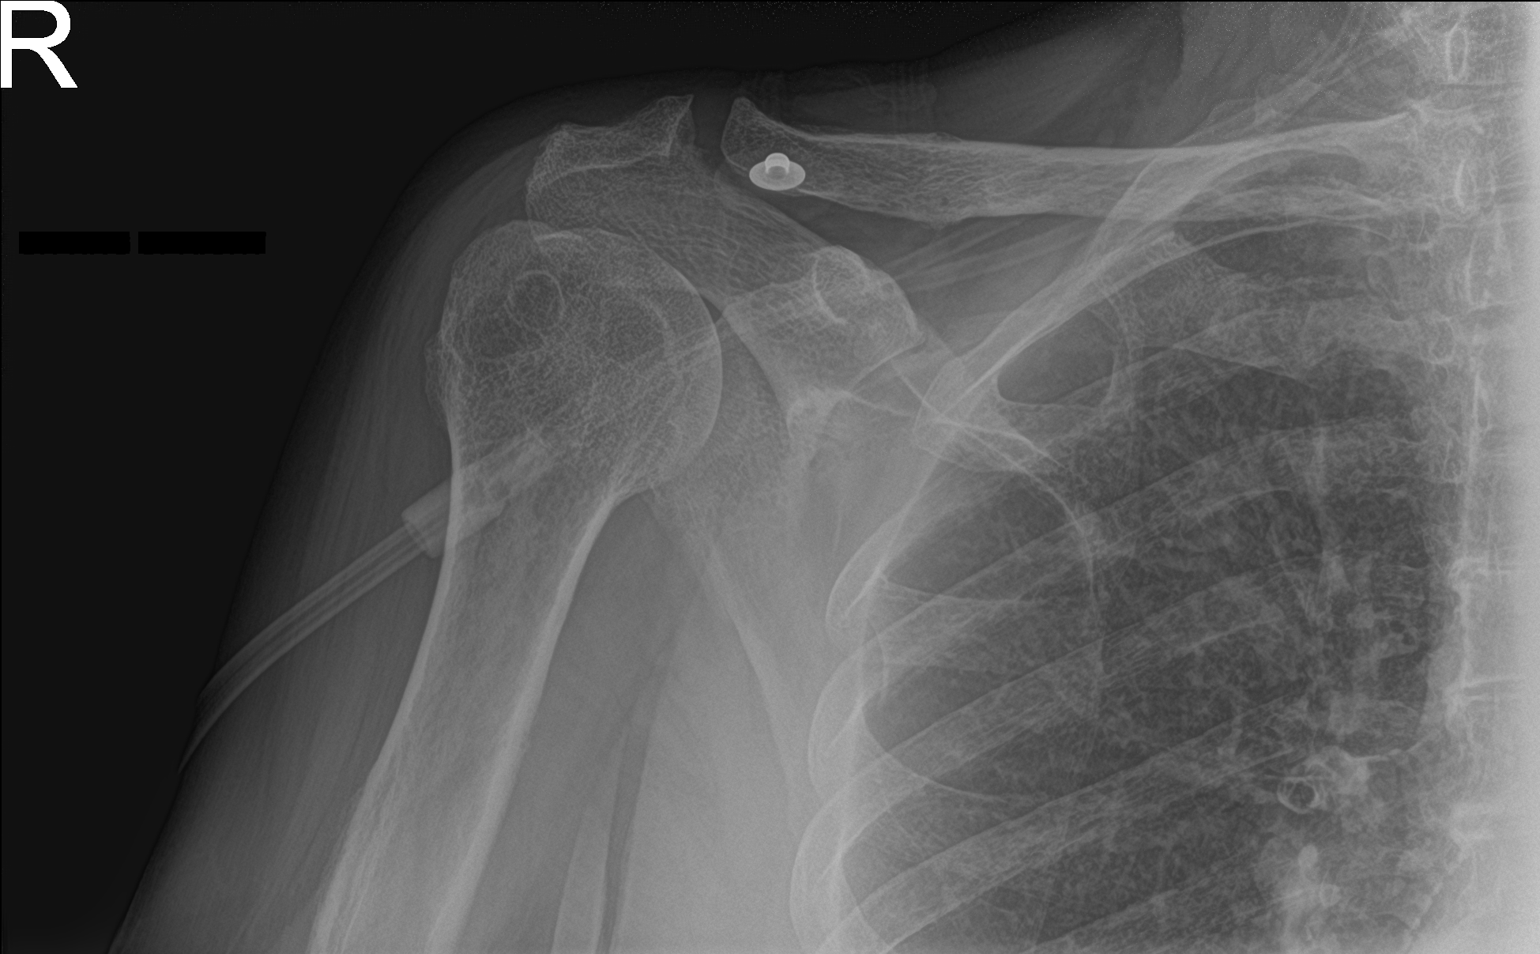

[2 of 2 positions shown; findings below may reference images not displayed]

FINDINGS: There is osteopenia without evidence of fracture or dislocation.
There is trace spurring of the AC joint.

Unremarkable glenohumeral joint with well-circumscribed cystic
changes in the humeral head which have a benign appearance and could
be degenerative or otherwise benign etiology in nature.
IMPRESSION: Osteopenia and degenerative change without evidence of fractures.
Benign-appearing cystic changes in the humeral head.

## 2021-10-25 IMAGING — CT CT HEAD W/O CM
3 series · 14 of 47 positions shown, 16 images · non-contrast
Comparison: [DATE] [DATE], [DATE].  [DATE] [DATE], [DATE].

CLINICAL DATA: Seizure disorder. History of metastatic ovarian
cancer.



[Series 3: head 5.0 h30s · axial · 0.47mm/px · z∈[-177,-52]mm · 8 of 31 slices shown, 10 images]
[im 3/31  brain]
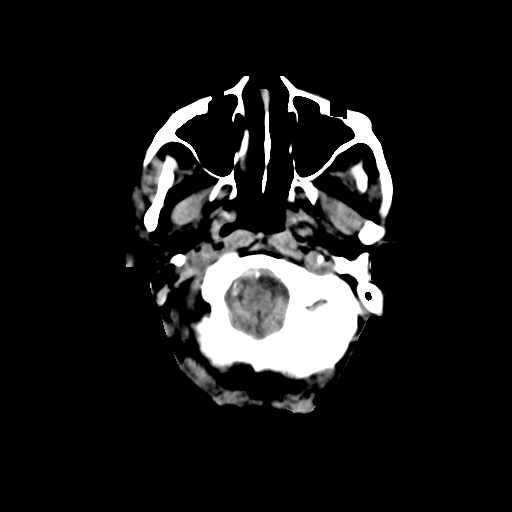
[im 3/31  bone]
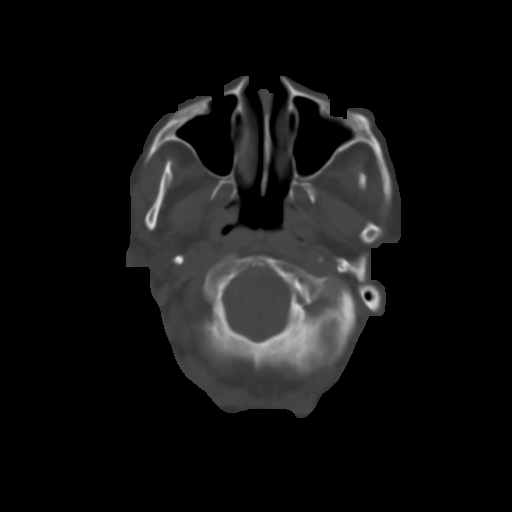
[im 7/31  brain]
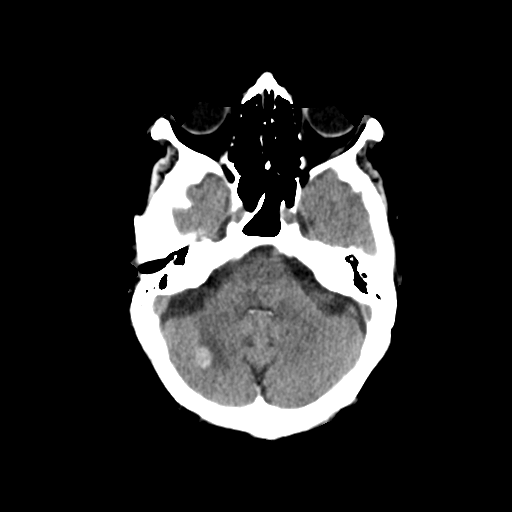
[im 10/31  brain]
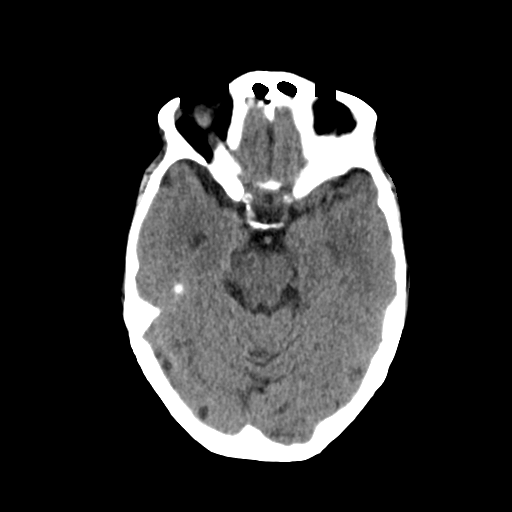
[im 14/31  brain]
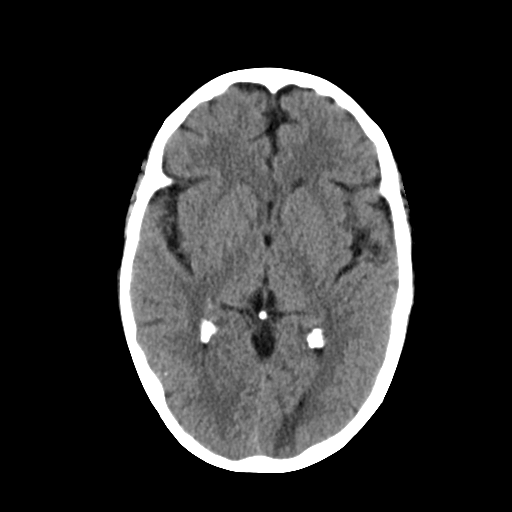
[im 17/31  brain]
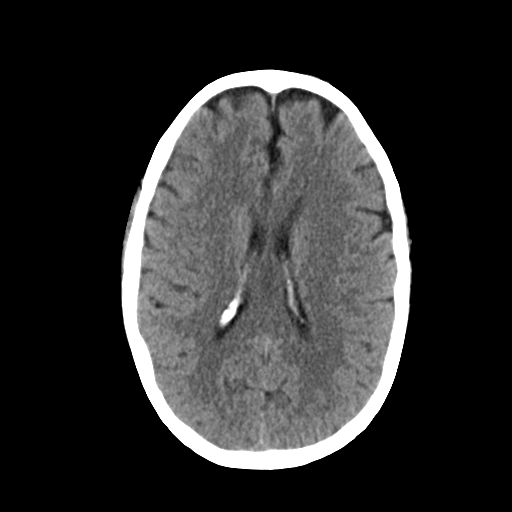
[im 17/31  bone]
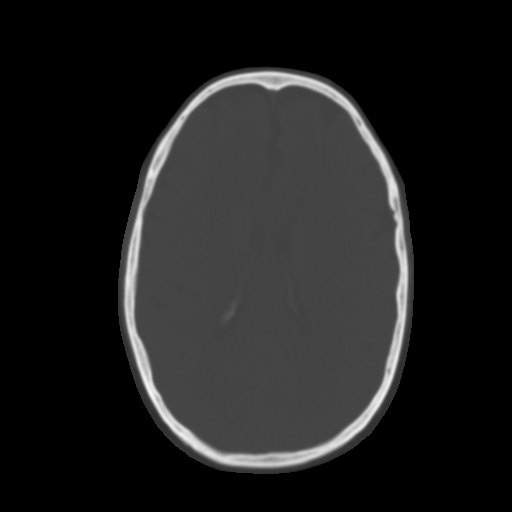
[im 21/31  brain]
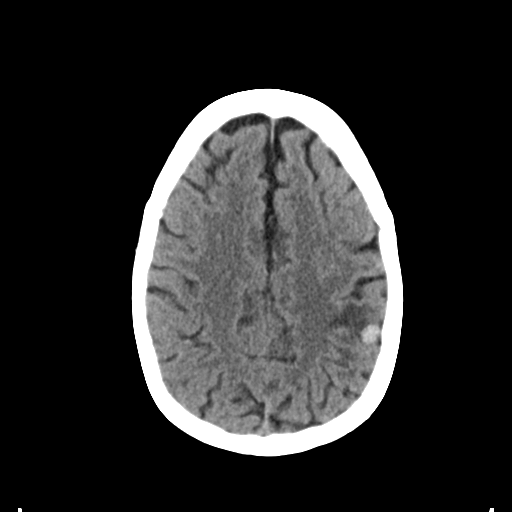
[im 24/31  brain]
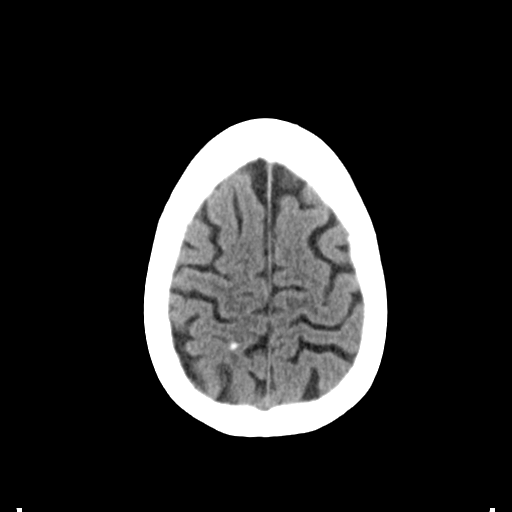
[im 28/31  brain]
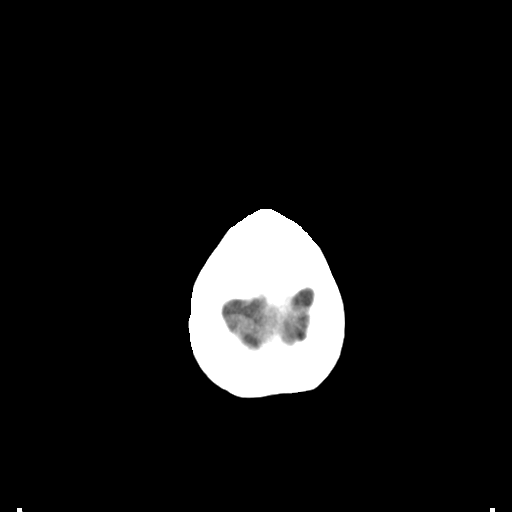

[Series 5: head 3.0 mpr cor · coronal · 0.30mm/px · 3 of 67 slices shown]
[im 23/67  brain]
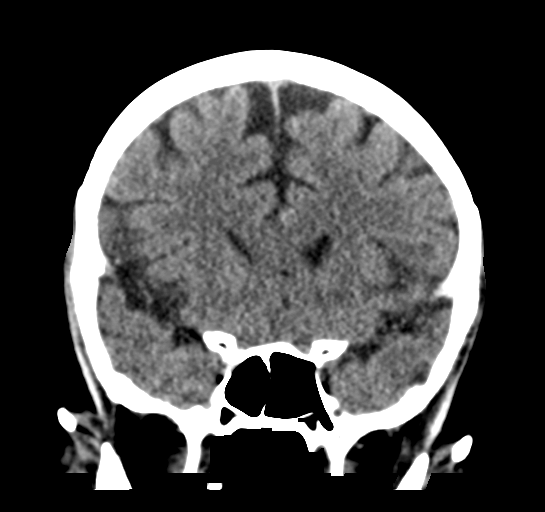
[im 30/67  brain]
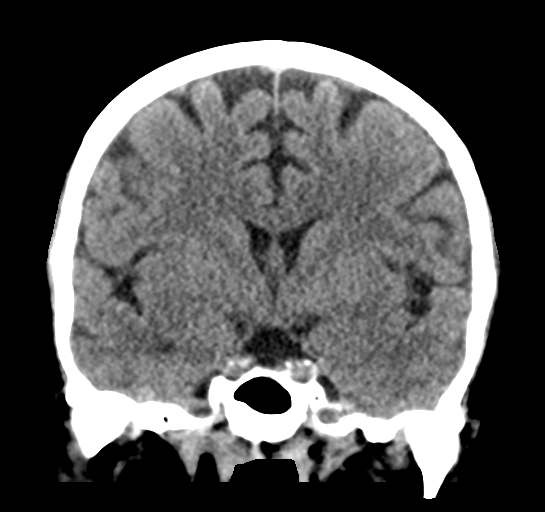
[im 37/67  brain]
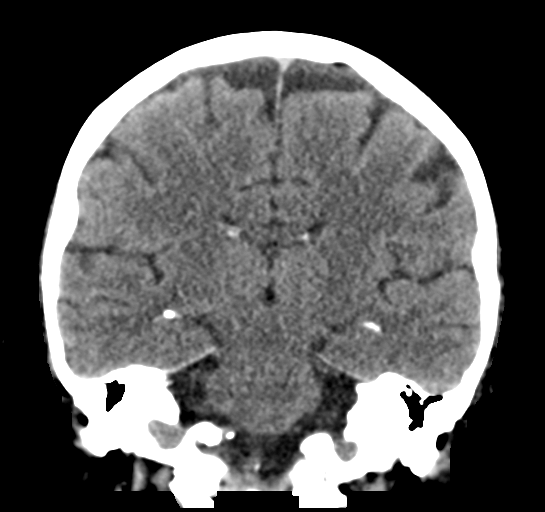

[Series 6: head 3.0 mpr sag · sagittal · 0.30mm/px · 3 of 58 slices shown]
[im 20/58  brain]
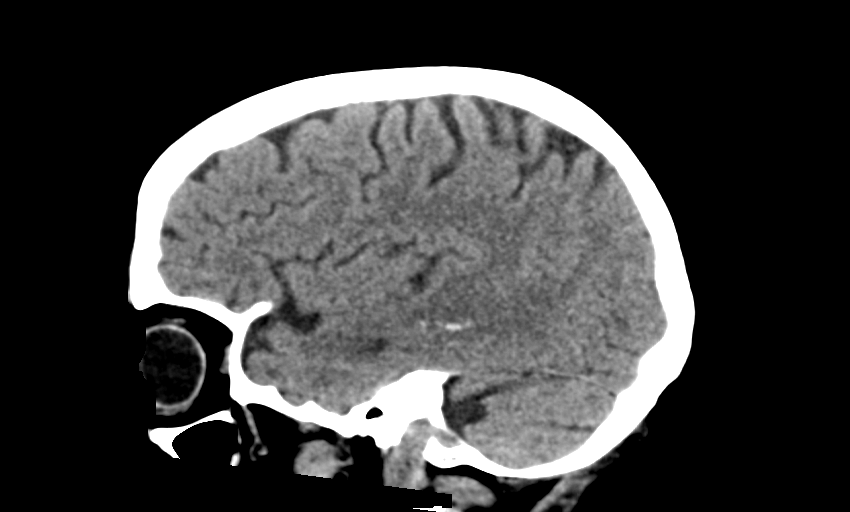
[im 29/58  brain]
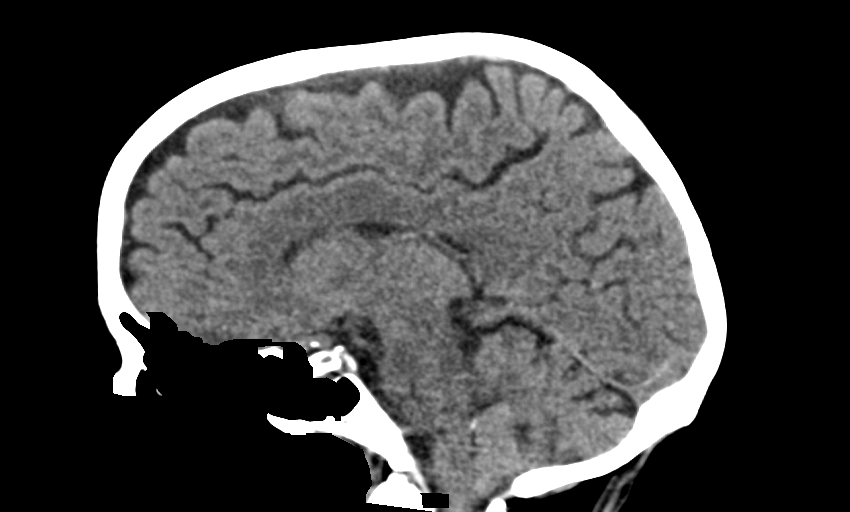
[im 39/58  brain]
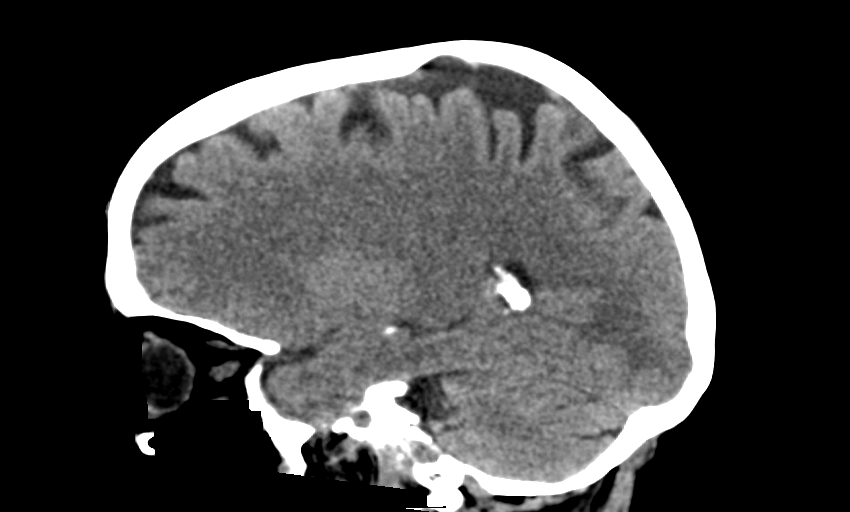

[14 of 47 positions shown; findings below may reference images not displayed]

FINDINGS: Brain: Ventricular size is within normal limits. 9 mm rounded
hyperdensity is noted in right cerebellar hemisphere with
surrounding low density concerning for metastatic disease with
edema. Also noted is 1 cm high density in left occipital lobe with
surrounding low density consistent with metastatic disease. 9 mm
left parietal high density density is noted with surrounding low
density also consistent with metastatic disease.

Vascular: No hyperdense vessel or unexpected calcification.

Skull: Normal. Negative for fracture or focal lesion.

Sinuses/Orbits: No acute finding.

Other: None.
IMPRESSION: Rounded hyperdense lesions are noted in the right cerebellar
hemisphere, left occipital lobe and left parietal cortex with
surrounding white matter edema, most consistent with metastatic
lesions. MRI is recommended for further evaluation.

## 2021-10-25 IMAGING — MR MR HEAD WO/W CM
15 of 17 series · 40 of 48 positions shown · IV contrast (Contrast agent)
Comparison: MRI head [DATE]

CLINICAL DATA: Metastatic fallopian tube carcinoma.

EXAM:
MRI HEAD WITHOUT AND WITH CONTRAST
TECHNIQUE: Multiplanar, multiecho pulse sequences of the brain and surrounding
structures were obtained without and with intravenous contrast.
CONTRAST:  10mL GADAVIST GADOBUTROL 1 MMOL/ML IV SOLN

[Series 5: DWI · axial · 3.0mm · 0.88mm/px · z∈[-129,+18]mm · 5 of 100 slices shown (1 of 4)]
[im 1/100]
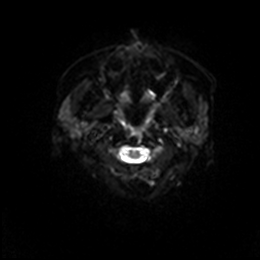
[im 25/100]
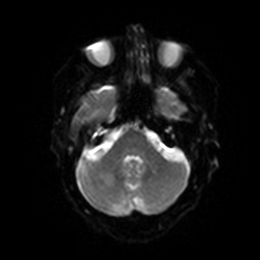
[im 50/100]
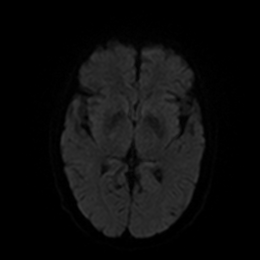
[im 75/100]
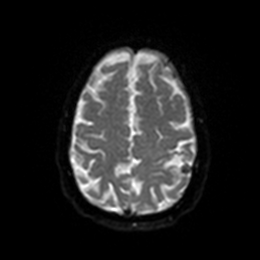
[im 100/100]
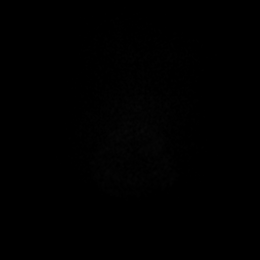

[Series 6: DWI · axial · 3.0mm · 0.88mm/px · z∈[-129,+18]mm · 2 of 50 slices shown (2 of 4)]
[im 1/50]
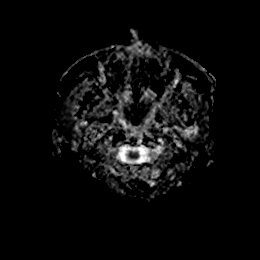
[im 50/50]
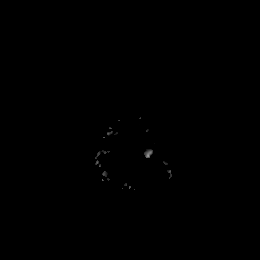

[Series 7: DWI · coronal · 4.0mm · 0.88mm/px · 5 of 72 slices shown (3 of 4)]
[im 1/72]
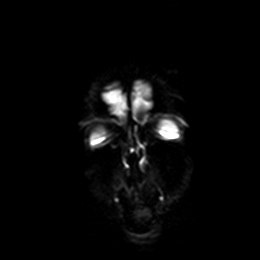
[im 18/72]
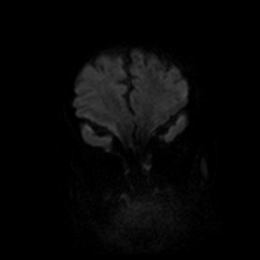
[im 36/72]
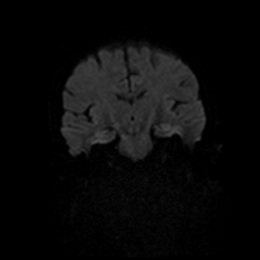
[im 54/72]
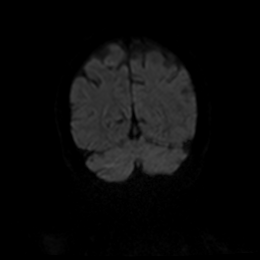
[im 72/72]
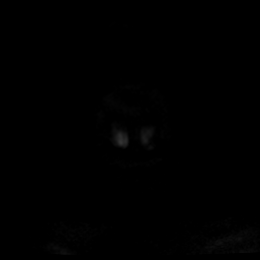

[Series 8: DWI · coronal · 4.0mm · 0.88mm/px · 2 of 36 slices shown (4 of 4)]
[im 1/36]
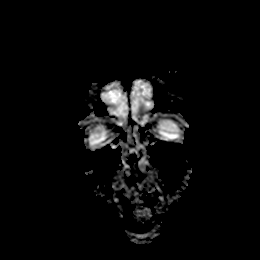
[im 36/36]
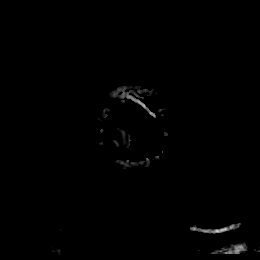

[Series 10: mag_images · axial · 3.0mm · 0.90mm/px · z∈[-137,+28]mm · 4 of 56 slices shown]
[im 1/56]
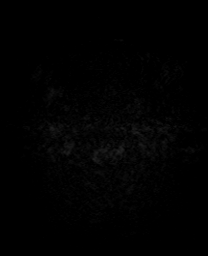
[im 19/56]
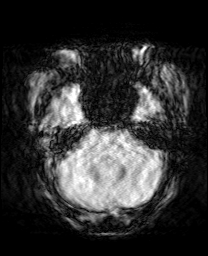
[im 37/56]
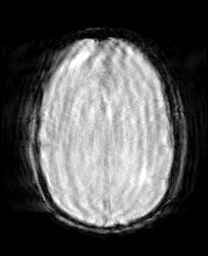
[im 56/56]
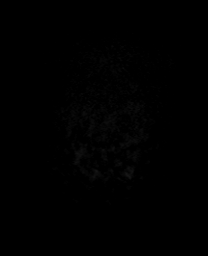

[Series 11: pha_images · axial · 3.0mm · 0.90mm/px · z∈[-137,+25]mm · 3 of 49 slices shown]
[im 1/49]
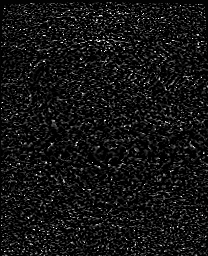
[im 25/49]
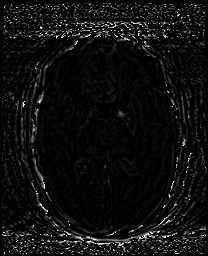
[im 49/49]
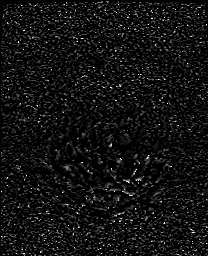

[Series 12: swi_images · axial · 3.0mm · 0.90mm/px · z∈[-137,+28]mm · 4 of 56 slices shown]
[im 1/56]
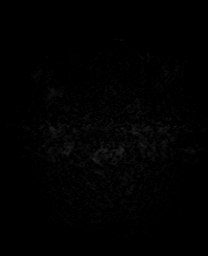
[im 19/56]
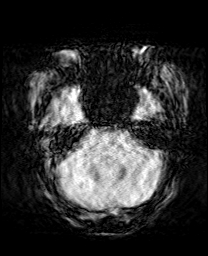
[im 37/56]
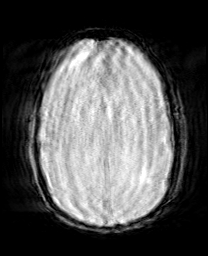
[im 56/56]
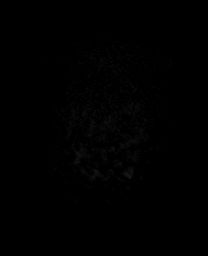

[Series 13: mip_images(sw) · axial · 24.0mm · 0.90mm/px · z∈[-126,+18]mm · 3 of 49 slices shown]
[im 1/49]
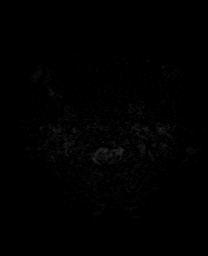
[im 25/49]
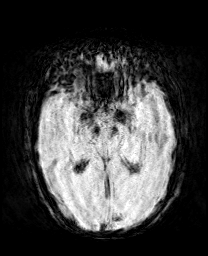
[im 49/49]
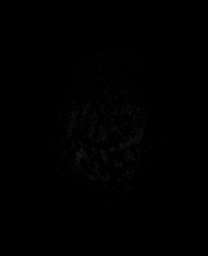

[Series 15: T2 · axial · 5.0mm · 0.72mm/px · z∈[-127,+17]mm · 2 of 25 slices shown]
[im 1/25]
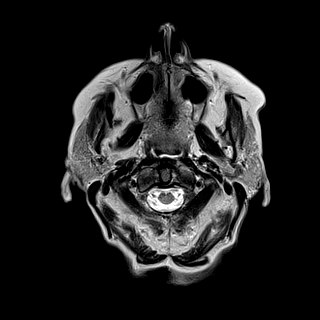
[im 25/25]
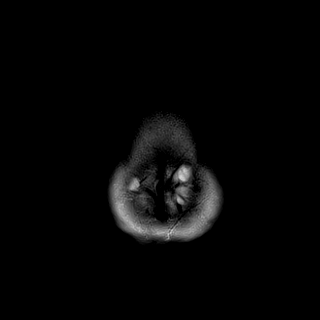

[Series 16: FLAIR · axial · 5.0mm · 0.90mm/px · z∈[-118,+26]mm · 2 of 25 slices shown]
[im 1/25]
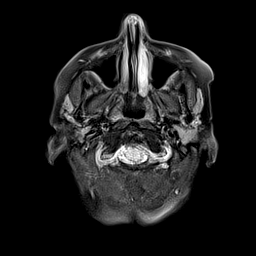
[im 25/25]
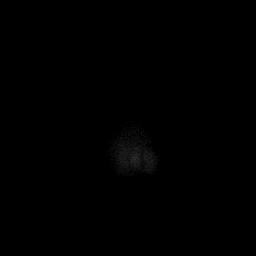

[Series 17: T1 · sagittal · 5.0mm · 0.75mm/px · 1 of 23 slices shown]
[im 1/23]
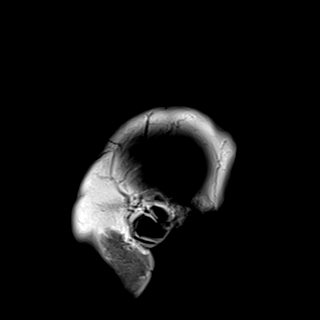

[Series 18: ax hemo · axial · 5.0mm · 0.86mm/px · z∈[-121,+23]mm · 2 of 25 slices shown]
[im 1/25]
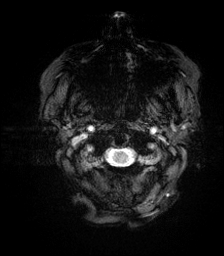
[im 25/25]
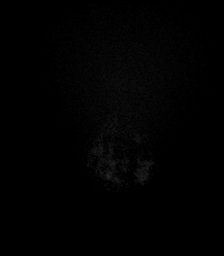

[Series 20: T2 post-contrast · coronal · 5.0mm · 0.72mm/px · 2 of 31 slices shown]
[im 1/31]
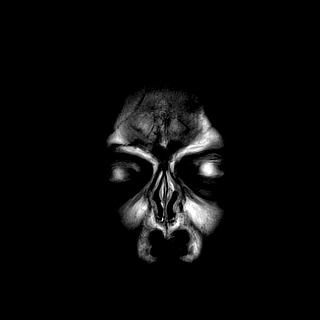
[im 31/31]
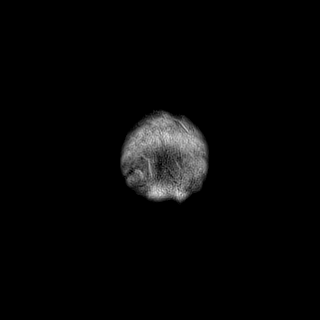

[Series 22: T1 post-contrast · coronal · 5.0mm · 0.34mm/px · 2 of 31 slices shown (1 of 2)]
[im 1/31]
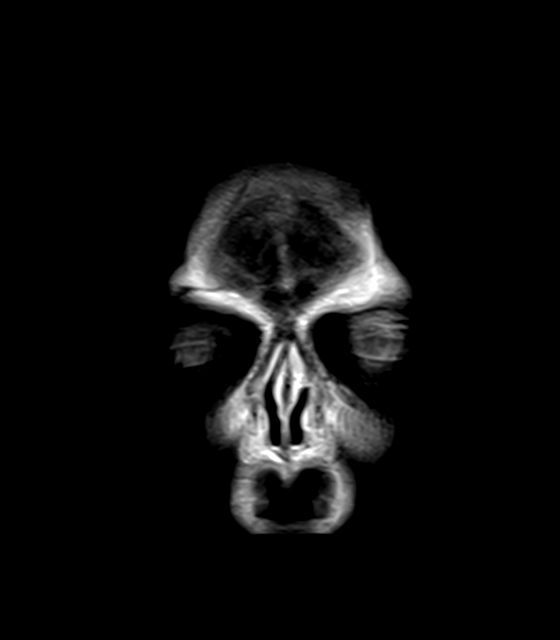
[im 31/31]
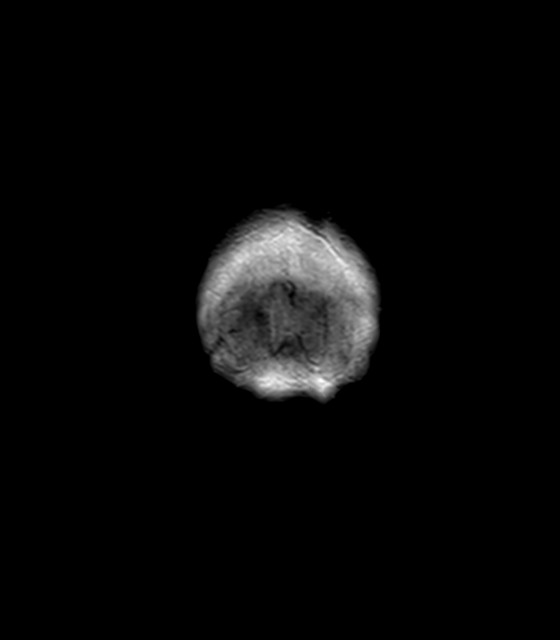

[Series 23: T1 post-contrast · sagittal · 5.0mm · 0.72mm/px · 1 of 23 slices shown (2 of 2)]
[im 1/23]
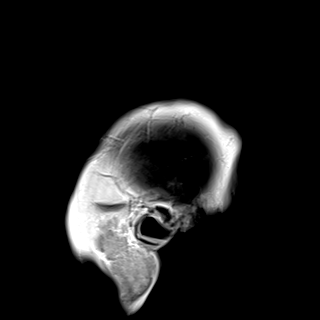

[40 of 48 positions shown; findings below may reference images not displayed]

FINDINGS: Brain: Multiple enhancing metastatic deposits in the brain with
progression from the prior study. Findings compatible with
metastatic disease.

New lesions: Right cerebellar lesion 7.5 mm with surrounding edema
and hemorrhage.

9 mm left occipital lesion with surrounding edema and mild
hemorrhage.

3 mm enhancing lesion left medial frontal lobe anteriorly axial
image 34

4 mm left parietal lesion with mild edema and hemorrhage. Axial
image 35

7.4 mm lesion in the left parietal lobe peripherally with
surrounding edema and hemorrhage.

Improved lesion: Right parietal metastatic deposit is significantly
smaller. Edema has resolved. Chronic blood products are present in
the region of the previously treated tumor.

Punctate enhancing lesion right frontal lobe has resolved

5 mm lesion right frontal lobe has resolved

Punctate lesion in the left medial temporal lobe has resolved

Image quality degraded by motion especially on the postcontrast
images

Linear enhancement in the right frontal lobe axial image 24 was
present previously and may be a small vessel or developmental venous
anomaly.

Ventricle size normal. No midline shift. Negative for acute infarct.

Vascular: Normal arterial flow voids at the skull base.

Skull and upper cervical spine: No focal skeletal metastasis.

Sinuses/Orbits: Paranasal sinuses clear. Mild mastoid effusion.
Bilateral cataract extraction

Other: None
IMPRESSION: 1. Multiple new areas of metastatic disease in the brain. At least 5
new lesions are identified. Several lesions show edema and
hemorrhage.
2. Multiple treated lesions have improved since the prior MRI of
[DATE].

## 2021-10-25 MED ORDER — LEVETIRACETAM IN NACL 1000 MG/100ML IV SOLN
1000.0000 mg | Freq: Once | INTRAVENOUS | Status: AC
  Administered 2021-10-25: 1000 mg via INTRAVENOUS

## 2021-10-25 MED ORDER — LORAZEPAM 2 MG/ML IJ SOLN
1.0000 mg | Freq: Once | INTRAMUSCULAR | Status: AC | PRN
  Administered 2021-10-25: 1 mg via INTRAVENOUS
  Filled 2021-10-25: qty 1

## 2021-10-25 MED ORDER — LACTATED RINGERS IV BOLUS
500.0000 mL | Freq: Once | INTRAVENOUS | Status: AC
  Administered 2021-10-25: 500 mL via INTRAVENOUS

## 2021-10-25 MED ORDER — DEXAMETHASONE 4 MG PO TABS: 4.0000 mg | ORAL_TABLET | Freq: Three times a day (TID) | ORAL | 0 refills | Status: AC

## 2021-10-25 MED ORDER — GADOBUTROL 1 MMOL/ML IV SOLN
10.0000 mL | Freq: Once | INTRAVENOUS | Status: AC | PRN
  Administered 2021-10-25: 10 mL via INTRAVENOUS

## 2021-10-25 MED ORDER — DEXAMETHASONE SODIUM PHOSPHATE 10 MG/ML IJ SOLN
8.0000 mg | Freq: Once | INTRAMUSCULAR | Status: AC
  Administered 2021-10-25: 8 mg via INTRAVENOUS
  Filled 2021-10-25: qty 1

## 2021-10-25 MED ORDER — SODIUM CHLORIDE 0.9 % IV SOLN: 2000.0000 mg | Freq: Once | INTRAVENOUS | Status: DC

## 2021-10-25 NOTE — ED Triage Notes (Signed)
Pt is hospice pt BIB [facility] from home following seizure, per EMS upon arrival O2 was 84%, pt was placed on 4L, later en route pt was 93% on RA and placed back on 2L [place] ?

## 2021-10-25 NOTE — Discharge Instructions (Addendum)
Recommend taking the steroids as recommended by the neurosurgeon.  Follow-up with neurosurgeon, your primary doctor, your cancer doctor.  Come back to ER as needed.  Follow-up with hospice as well. ?

## 2021-10-25 NOTE — Consult Note (Signed)
? ?                                                                                ?Consultation Note ?Date: 10/25/2021  ? ?Patient Name: Jamie Montoya  ?DOB: 1946-01-02  MRN: [id number]  Age / Sex: 76 y.o., female  ?PCP: Leonides Sake, MD ?Referring Physician: Teressa Lower, MD ? ?Reason for Consultation: Establishing goals of care ? ?HPI/Patient Profile: 76 y.o. 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 ? ?Patient and family have unclear wishes regarding code status and level of care, with MOST form on file indicating DNR/full comfort care. PMT has been consulted to assist with goals of care conversation. ? ?Clinical Assessment and Goals of Care: ? ?I have reviewed medical records including EPIC notes, labs and imaging, received report from RN, assessed the patient and then met at the bedside along with her two granddaughters in person and daughter Melody via phone to discuss diagnosis prognosis, [place], EOL wishes, disposition and options. ? ?I introduced Palliative Medicine as specialized medical care for people living with serious illness. It focuses on providing relief from the symptoms and stress of a serious illness. The goal is to improve quality of life for both the patient and the family. ? ?We discussed a brief life review of the patient and then focused on their current illness. The natural disease trajectory and expectations at EOL were discussed. ? ?I attempted to elicit values and goals of care important to the patient.   ? ?Medical History Review and Understanding: ?Patient shares that she has had fallopian cancer for a couple of years, which was then thought to be in her uterus. She tells me she was advised against hysterectomy and eventually it was determined she had mets to  the brain. She states "there is a lot wrong with me." Reviewed poor prognosis in patients with metastases to the brain, patient and family verbalize their understanding. ? ?Social History: ?Patient has been divorced twice. She lives at home with her family for caregiver support. She has 1 daughter and 1 son, 2 granddaughters and 3 grandsons. She enjoys spending time setting up her bird feeders and being outdoors.  ? ?Functional and Nutritional State: ?Patient uses a motorized scooter to ambulate and can take a few steps to get on and off. She has been able to take visits to the bank as recently as yesterday, doing dishes last week. She endorses having a rapid decline with worsening seizures.   ? ?Palliative Symptoms: ?Diarrhea, seizures, pain, anxiety ? ?Advance Directives: ?A detailed discussion regarding advanced directives was had. No HCPOA on file. ? ?Code Status: ?Concepts specific to code status, artifical feeding and hydration, and rehospitalization were considered and discussed. Recommended consideration of DNR status, understanding evidenced-based poor outcomes in similar hospitalized patients, as the cause of the arrest is likely associated with chronic/terminal disease rather than a reversible acute cardio-pulmonary event.  ? ?Discussion: ?Patient shares that she has had a great  experience with hospice and appreciates her nurses, however she is not ready to die and wants to "go all the way" with interventions to prolong her life. At the same time, she tells me her brother died just last week and states "I'm next I guess." Explored her thoughts and feelings on her current illness and provided emotional support, therapeutic listening. Patient has been refusing pain medications at home and states that she has a fear of developing tolerance and less effectiveness "later on when I really need it." Education was provided on end of life pain management. She also has likely missed some doses of her seizure  medication. Patient's family reports she has been repeatedly that stating her wishes are now to continue fighting to live. She is looking forward to family activities at the end of this month and would want any available interventions to achieve this goal. Her goal is also to find out more information about her disease progression and she would probably want to continue hospice once she returns home. After a detailed conversation about code status and the risks of causing more suffering and harm than benefit, patient and family tearfully agree that she would rather have a peaceful and natural death if she passes despite all efforts to prolong her life. A MOST form was reviewed and updated today. Patient tells me she would accept an ICU admission if it would prolong her life. ? ? ?Cardiopulmonary Resuscitation: Do Not Attempt Resuscitation (DNR/No CPR)  ?Medical Interventions: Limited additional interventions: Use medical treatment, IV fluids, and cardiac monitoring as indicated. Do not use intubation or mechanical ventilation. May consider use of less invasive airway support such as BiPAP or CPAP. Also provide comfort measures. Transfer to hospital if indicated.  ?Antibiotics: Antibiotics if indicated  ?IV Fluids: IV fluids if indicated  ?Feeding Tube: No feeding tube  ?   ? ?Discussed the importance of continued conversation with family and the medical providers regarding overall plan of care and treatment options, ensuring decisions are within the context of the patient?s values and GOCs.  ? ?Questions and concerns were addressed. The family was encouraged to call with questions or concerns.  PMT will continue to support holistically.  ? ?  ? ?SUMMARY OF RECOMMENDATIONS   ?-DNR order placed ?-MOST form updated today and prior form has been deleted from [facility]; provided family with original and a copy, will scan copy into [employer]  ?-Psychosocial and emotional support provided ?-PMT will continue to follow and support  as needed ? ?Prognosis:  ?Poor prognosis, remains hospice appropriate ? ?Discharge Planning: Home with Hospice  ? ?  ? ?Primary Diagnoses: ?Present on Admission: ?**None** ? ? ?I have reviewed the medical record, interviewed the patient and family, and examined the patient. The following aspects are pertinent. ? ?Past Medical History:  ?Diagnosis Date  ? Cancer [facility])   ? uterine  ? Cataract   ? bilateral  ? Chronic diarrhea   ? pt reports r/t Crohn's  ? Chronic diastolic CHF (congestive heart failure) ([place])   ? Chronic edema   ? bilateral lower extremity edema  ? Chronic respiratory failure (HCC)   ? 3L  ? COPD (chronic obstructive pulmonary disease) ([place])   ? Esophageal stricture   ? GERD (gastroesophageal reflux disease) 09/10/2010  ? Hiatal hernia 09/10/2010  ? 1-2 cm  ? Hyperlipidemia   ? borderline  ? Hypokalemia   ? Hypothyroidism   ? Hypoxia 11/2019  ? Morbid obesity ([place])   ? Oxygen deficiency   ?  has 02 at home to use as needed. no use in the last several months 2.5L as needed   ? Paroxysmal SVT (supraventricular tachycardia) (HCC)   ? Stricture and stenosis of esophagus 09/10/2010  ? Ulcerative colitis, universal ([place]) 09/10/2010  ? endoscopic changes in rectum and sigmoid, microscopic elsewhere  ? ?Social History  ? ?Socioeconomic History  ? Marital status: Divorced  ?  Spouse name: Not on file  ? Number of children: 2  ? Years of education: Not on file  ? Highest education level: Not on file  ?Occupational History  ? Occupation: retired [employer]  ?Tobacco Use  ? Smoking status: Former  ?  Types: Cigarettes  ?  Quit date: 06/04/2003  ?  Years since quitting: 18.4  ? Smokeless tobacco: Never  ?Vaping Use  ? Vaping Use: Never used  ?Substance and Sexual Activity  ? Alcohol use: No  ?  Alcohol/week: 0.0 standard drinks  ?  Comment: occasional ,very rare  ? Drug use: No  ? Sexual activity: Not on file  ?Other Topics Concern  ? Not on file  ?Social History Narrative  ? Not on file  ? ?Social Determinants  of Health  ? ?Financial Resource Strain: Not on file  ?Food Insecurity: Not on file  ?Transportation Needs: Not on file  ?Physical Activity: Not on file  ?Stress: Not on file  ?Social Connections: Not on file

## 2021-10-25 NOTE — ED Notes (Signed)
Patient transported to MRI 

## 2021-10-25 NOTE — Progress Notes (Signed)
Manufacturing engineer [facility] Hospitalized Hospice Patient RN Note: ? ?[name] is a current [place] patient with a terminal diagnosis of Fallopian tube cancer with metastasis to the brain. Patient was admitted on [facility] services on 07/18/21 and is a DNR.  ? ?Received update from bedside RN and gave her [facility] home Medication List and our Transfer Summary information. Visited patient in room with granddaughter at side. ACC Chaplain and ACC SW were completing a visit of support at time of my visit. Patient was sleepy with no complaints at this time, stating that she was ready to go home once her tests were complete. Encouraged patient and family to call [facility] for support as needed - family has our contact information. ? ?ACC will continue to follow patient daily during this hospitalization. Please reach out for hospice related questions or concerns.  ? ?Thank you, ? ?Gar Ponto, RN ?[place] (on [place]) ?[phone] ?

## 2021-10-25 NOTE — ED Provider Notes (Signed)
Signout note ? ?76 year old lady presenting to ER due to concern for seizures.  Has history of prior seizures.  Notably has history of fallopian tube carcinoma with metastases to the brain.  Not been taking Keppra lately.  No further seizures after receiving Keppra load.  CT with worsening mets.  Case discussed with Dr. Venetia Constable who advised checking MRI of the brain. ? ?I reviewed MRI brain findings.  Multiple new areas of metastatic disease, several lesions with edema and hemorrhage.  I discussed the case with Dr. Ellene Route.  He states that he has reviewed the MRI.  Recommends discharge, outpatient follow-up, 4 mg Decadron 3 times daily for now.  Reviewed the rest of patient's labs.  TSH low, T4 elevated.  I discussed all these findings with patient and patient's granddaughter at bedside.  Granddaughter states they desire to have patient home if at all possible.  They are open to some interventions but would not want any aggressive care or invasive care.  Patient is enrolled in hospice services.  I advised follow-up with hospice care, neurosurgery, primary care and oncology.  Discussed importance of compliance with Keppra going forward.  Reviewed return precautions and discharged.  Will go by PTR. ?  ?[name], MD ?10/25/21 2028 ? ?

## 2021-10-25 NOTE — ED Provider Triage Note (Signed)
Emergency Medicine Provider Triage Evaluation Note ? ?Jamie Montoya , a 76 y.o. female  was evaluated in triage.  Pt complains of seizure.  Patient brought by EMS from home, on Keppra at home as seizure prophylaxis.  Patient was found to be hypoxic at home, per EMS patient is typically on room air and was found to be at 84%.  Patient denies any chest pain or shortness of breath, per EMS seizure lasted less than 3 minutes and she did not hit her head or lose consciousness.  Patient is under treatment by radiology oncology for metastatic cancer.. ? ?Review of Systems  ?Per HPI ? ?Physical Exam  ?Ht 5' 4"  (1.626 m)   Wt 112.9 kg   BMI 42.74 kg/m?  ?Gen:   Awake, no distress.  Cachectic ?Resp:  Normal effort  ?MSK:   Moves extremities without difficulty  ?Other:   ? ?Medical Decision Making  ?Medically screening exam initiated at 2:19 AM.  Appropriate orders placed.  Jamie Montoya was informed that the remainder of the evaluation will be completed by another provider, this initial triage assessment does not replace that evaluation, and the importance of remaining in the ED until their evaluation is complete. ? ?On 2 L nasal cannula, 89% on room air here in the ED. ?  ?Sherrill Raring, PA-C ?10/25/21 1595 ? ?
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
## 2021-10-25 NOTE — ED Notes (Signed)
Pt went home with ptar. Granddaughter at bedside at the time of departure  ?

## 2021-10-25 NOTE — ED Notes (Signed)
Pt returned form MRI.  ?

## 2021-10-26 LAB — LEVETIRACETAM LEVEL: Levetiracetam Lvl: 31.1 ug/mL (ref 10.0–40.0)

## 2021-10-26 LAB — T3, FREE: T3, Free: 2.4 pg/mL (ref 2.0–4.4)

## 2021-11-14 ENCOUNTER — Emergency Department (HOSPITAL_COMMUNITY)
Admission: EM | Admit: 2021-11-14 | Discharge: 2021-11-14 | Disposition: A | Discharge status: Home / Self Care | Attending: Emergency Medicine | Admitting: Emergency Medicine

## 2021-11-14 ENCOUNTER — Encounter (HOSPITAL_COMMUNITY): Payer: Self-pay

## 2021-11-14 ENCOUNTER — Emergency Department (HOSPITAL_COMMUNITY)

## 2021-11-14 ENCOUNTER — Other Ambulatory Visit: Payer: Self-pay

## 2021-11-14 DIAGNOSIS — Z66 Do not resuscitate: Secondary | ICD-10-CM | POA: Insufficient documentation

## 2021-11-14 DIAGNOSIS — C7931 Secondary malignant neoplasm of brain: Secondary | ICD-10-CM | POA: Insufficient documentation

## 2021-11-14 DIAGNOSIS — R569 Unspecified convulsions: Secondary | ICD-10-CM | POA: Insufficient documentation

## 2021-11-14 DIAGNOSIS — Z8543 Personal history of malignant neoplasm of ovary: Secondary | ICD-10-CM | POA: Diagnosis not present

## 2021-11-14 DIAGNOSIS — R6889 Other general symptoms and signs: Secondary | ICD-10-CM | POA: Diagnosis not present

## 2021-11-14 DIAGNOSIS — Z7401 Bed confinement status: Secondary | ICD-10-CM | POA: Diagnosis not present

## 2021-11-14 DIAGNOSIS — Z743 Need for continuous supervision: Secondary | ICD-10-CM | POA: Diagnosis not present

## 2021-11-14 DIAGNOSIS — R410 Disorientation, unspecified: Secondary | ICD-10-CM | POA: Diagnosis not present

## 2021-11-14 DIAGNOSIS — Z79899 Other long term (current) drug therapy: Secondary | ICD-10-CM | POA: Diagnosis not present

## 2021-11-14 DIAGNOSIS — R251 Tremor, unspecified: Secondary | ICD-10-CM | POA: Diagnosis not present

## 2021-11-14 DIAGNOSIS — Z85841 Personal history of malignant neoplasm of brain: Secondary | ICD-10-CM | POA: Insufficient documentation

## 2021-11-14 DIAGNOSIS — I10 Essential (primary) hypertension: Secondary | ICD-10-CM | POA: Diagnosis not present

## 2021-11-14 LAB — CBC WITH DIFFERENTIAL/PLATELET
Abs Immature Granulocytes: 0.01 10*3/uL (ref 0.00–0.07)
Basophils Absolute: 0 10*3/uL (ref 0.0–0.1)
Basophils Relative: 0 %
Eosinophils Absolute: 0 10*3/uL (ref 0.0–0.5)
Eosinophils Relative: 1 %
HCT: 25.8 % — ABNORMAL LOW (ref 36.0–46.0)
Hemoglobin: 8.2 g/dL — ABNORMAL LOW (ref 12.0–15.0)
Immature Granulocytes: 0 %
Lymphocytes Relative: 24 %
Lymphs Abs: 0.7 10*3/uL (ref 0.7–4.0)
MCH: 32 pg (ref 26.0–34.0)
MCHC: 31.8 g/dL (ref 30.0–36.0)
MCV: 100.8 fL — ABNORMAL HIGH (ref 80.0–100.0)
Monocytes Absolute: 0.3 10*3/uL (ref 0.1–1.0)
Monocytes Relative: 9 %
Neutro Abs: 1.9 10*3/uL (ref 1.7–7.7)
Neutrophils Relative %: 66 %
Platelets: 177 10*3/uL (ref 150–400)
RBC: 2.56 MIL/uL — ABNORMAL LOW (ref 3.87–5.11)
RDW: 13.6 % (ref 11.5–15.5)
WBC: 2.9 10*3/uL — ABNORMAL LOW (ref 4.0–10.5)
nRBC: 0 % (ref 0.0–0.2)

## 2021-11-14 LAB — COMPREHENSIVE METABOLIC PANEL
ALT: 14 U/L (ref 0–44)
AST: 10 U/L — ABNORMAL LOW (ref 15–41)
Albumin: 2.9 g/dL — ABNORMAL LOW (ref 3.5–5.0)
Alkaline Phosphatase: 98 U/L (ref 38–126)
Anion gap: 12 (ref 5–15)
BUN: 38 mg/dL — ABNORMAL HIGH (ref 8–23)
CO2: 22 mmol/L (ref 22–32)
Calcium: 8.4 mg/dL — ABNORMAL LOW (ref 8.9–10.3)
Chloride: 103 mmol/L (ref 98–111)
Creatinine, Ser: 1.92 mg/dL — ABNORMAL HIGH (ref 0.44–1.00)
GFR, Estimated: 27 mL/min — ABNORMAL LOW (ref >60)
Glucose, Bld: 86 mg/dL (ref 70–99)
Potassium: 3.6 mmol/L (ref 3.5–5.1)
Sodium: 137 mmol/L (ref 135–145)
Total Bilirubin: 0.3 mg/dL (ref 0.3–1.2)
Total Protein: 5.7 g/dL — ABNORMAL LOW (ref 6.5–8.1)

## 2021-11-14 IMAGING — CT CT HEAD W/O CM
4 series · 16 of 47 positions shown, 18 images · non-contrast
Comparison: CT and brain MRI from 20 days ago

CLINICAL DATA: Mental status change.



[Series 3: head without · axial · non-contrast · 0.40mm/px · z∈[-50,+70]mm · 7 of 33 slices shown, 9 images]
[im 5/33  brain]
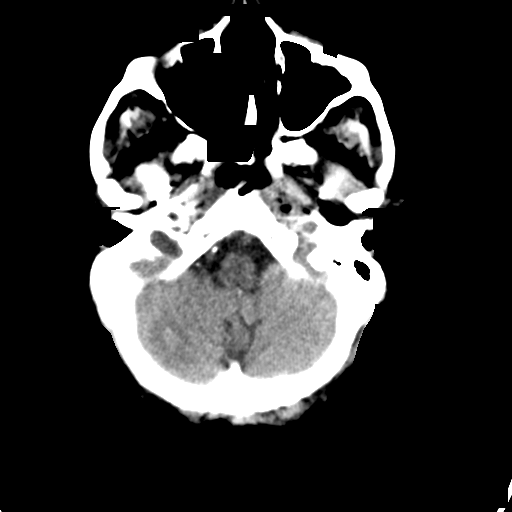
[im 5/33  bone]
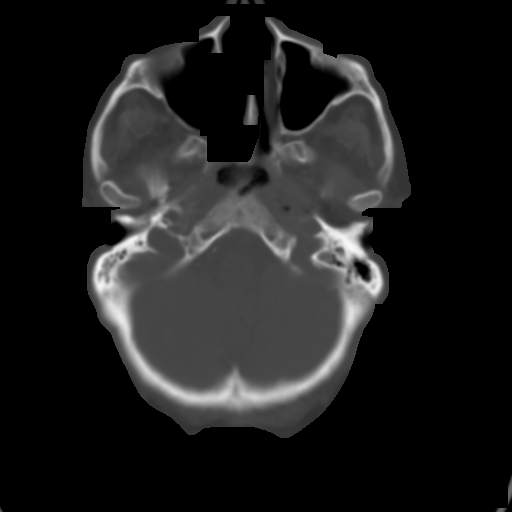
[im 9/33  brain]
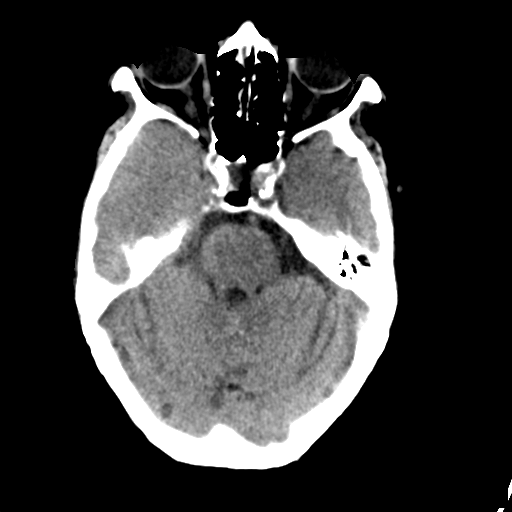
[im 13/33  brain]
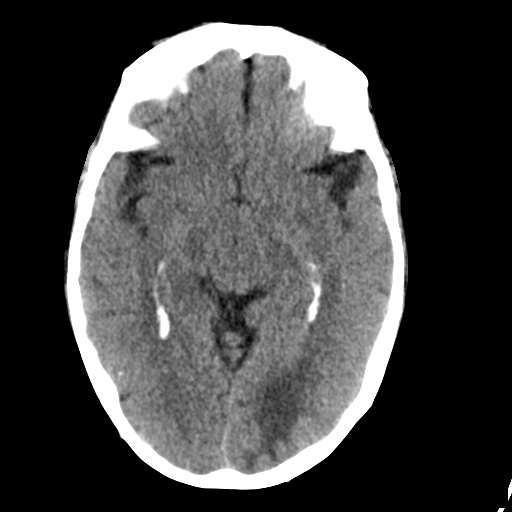
[im 17/33  brain]
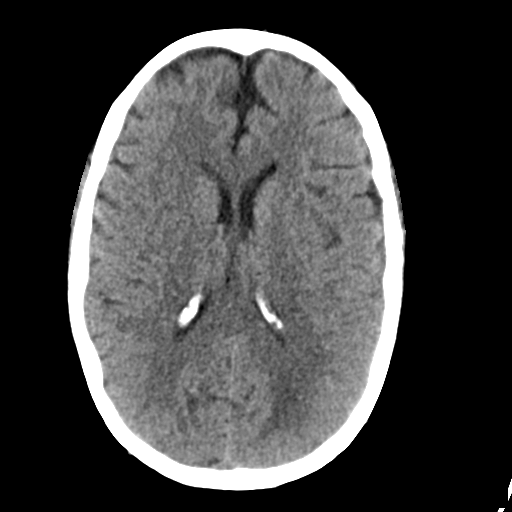
[im 21/33  brain]
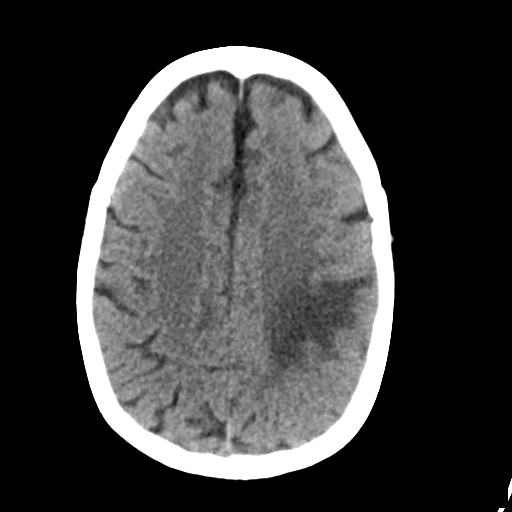
[im 21/33  bone]
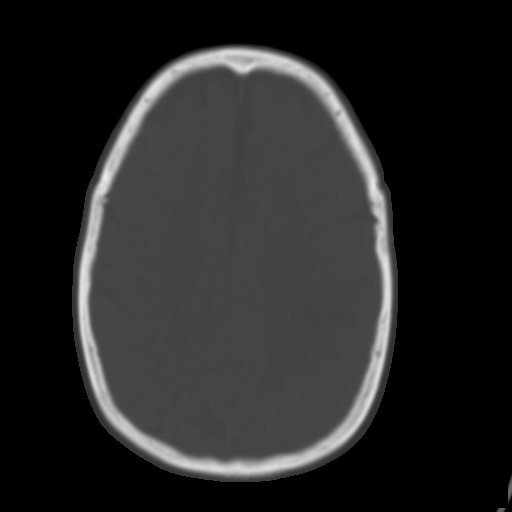
[im 25/33  brain]
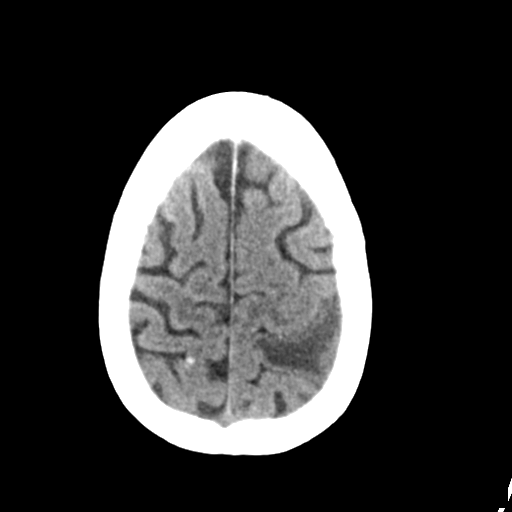
[im 29/33  brain]
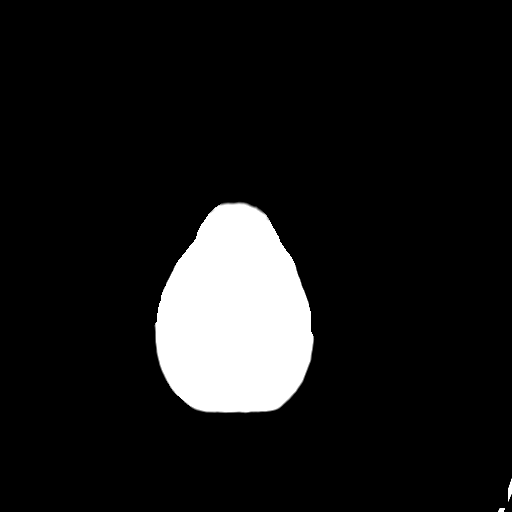

[Series 4: head bone · axial · 0.40mm/px · z∈[-54,-22]mm · 3 of 81 slices shown]
[im 9/81  bone]
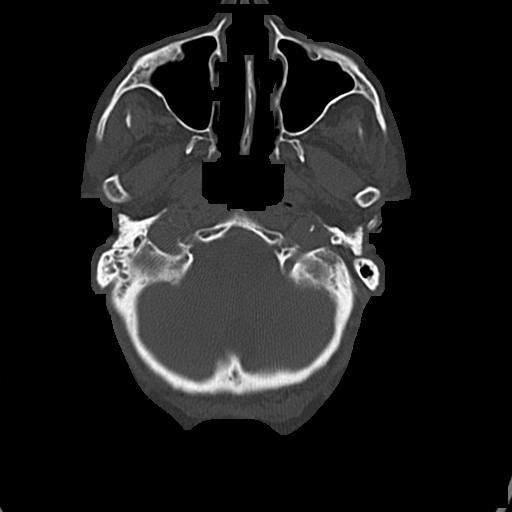
[im 17/81  bone]
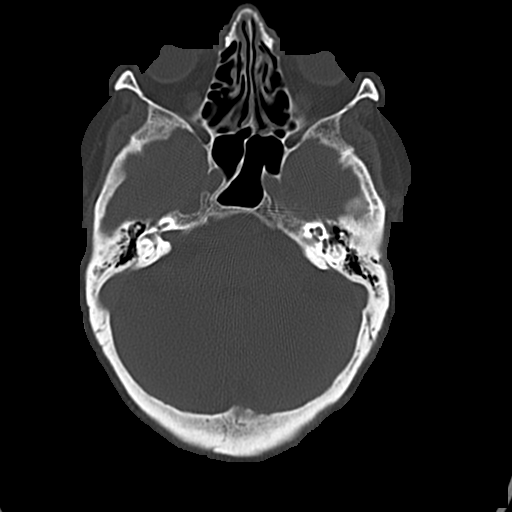
[im 25/81  bone]
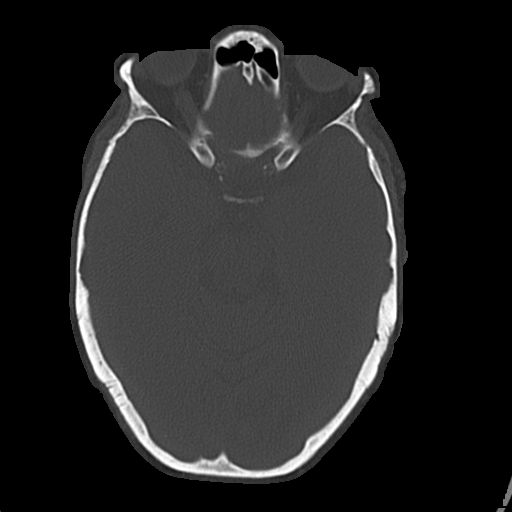

[Series 5: head without cor · coronal · non-contrast · 0.30mm/px · 3 of 67 slices shown]
[im 23/67  brain]
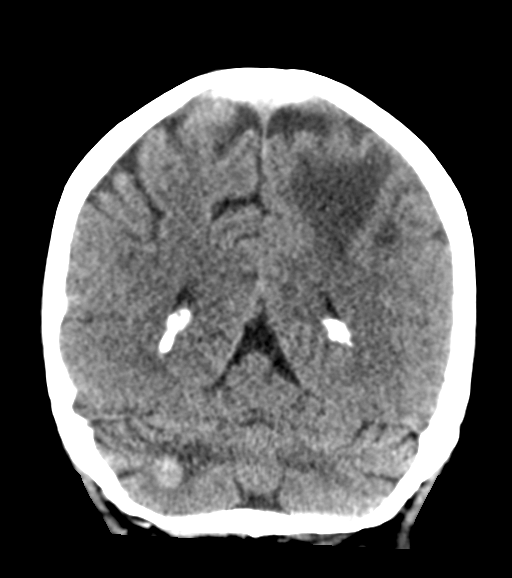
[im 30/67  brain]
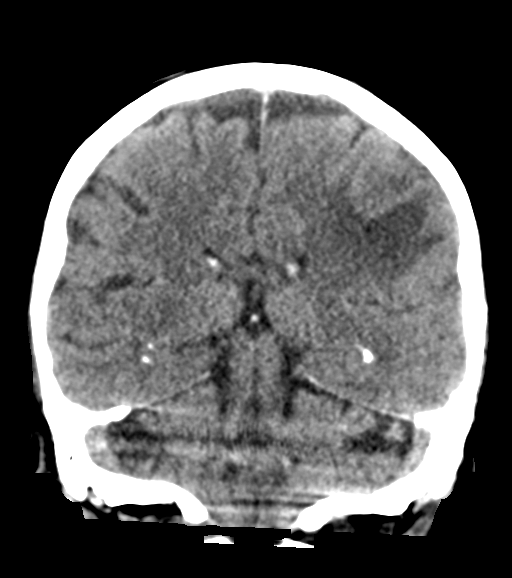
[im 37/67  brain]
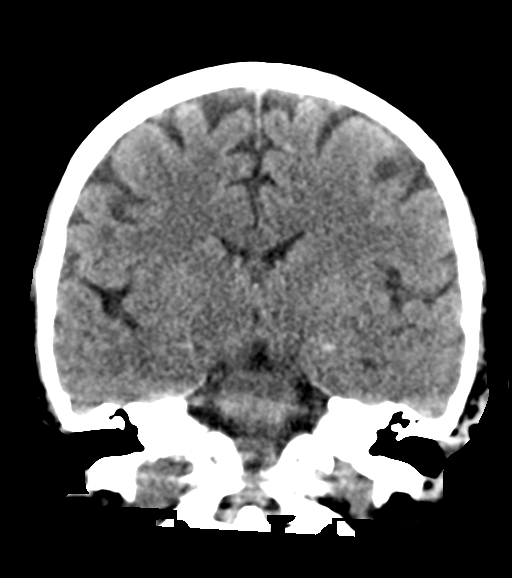

[Series 6: head without sag · sagittal · non-contrast · 0.34mm/px · 3 of 53 slices shown]
[im 18/53  brain]
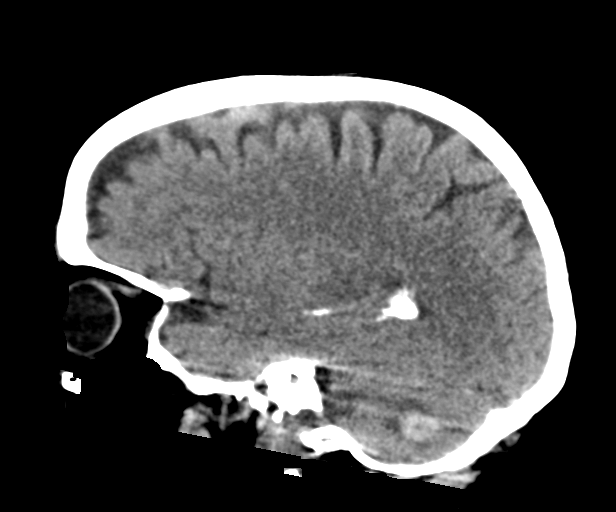
[im 27/53  brain]
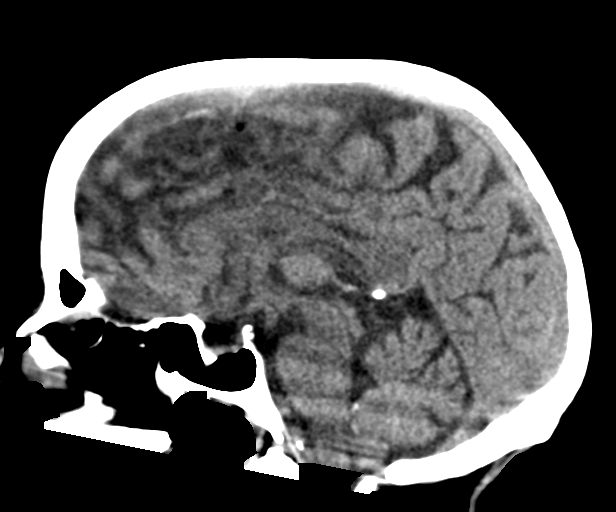
[im 35/53  brain]
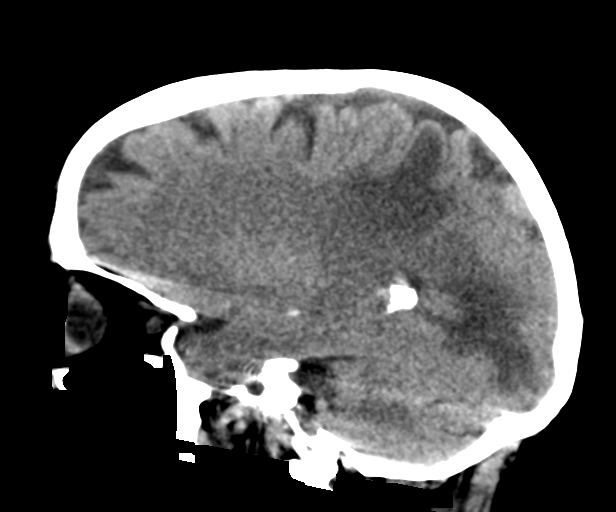

[16 of 47 positions shown; findings below may reference images not displayed]

FINDINGS: Brain: Known metastatic disease:

*A 12 mm high-density mass in the right cerebellum previously
measured 9 mm and shows mild increase in edema.
*A left occipital lobe mass measures 11 mm, previously 10 mm, with
mild increase in vasogenic edema.
*There is a left frontal parietal lesion which is no longer
high-density by CT but associated vasogenic edema is moderate and
progressed.

No cytotoxic edema appearance.  No hydrocephalus or midline shift.

Vascular: Atheromatous calcification.

Skull: Normal. Negative for fracture or focal lesion.

Sinuses/Orbits: Bilateral cataract resection
IMPRESSION: 1. Known metastatic disease. Hemorrhagic metastases in the right
cerebellum and left occipital lobe have increased by few millimeters
since imaging earlier this month.
2. Increased vasogenic edema associated with 3 the metastases,
especially progressed in the left frontal parietal region.

## 2021-11-14 MED ORDER — SODIUM CHLORIDE 0.9 % IV SOLN
200.0000 mg | Freq: Once | INTRAVENOUS | Status: AC
  Administered 2021-11-14: 200 mg via INTRAVENOUS
  Filled 2021-11-14: qty 20

## 2021-11-14 MED ORDER — DEXAMETHASONE SODIUM PHOSPHATE 10 MG/ML IJ SOLN
10.0000 mg | Freq: Once | INTRAMUSCULAR | Status: AC
  Administered 2021-11-14: 10 mg via INTRAVENOUS
  Filled 2021-11-14: qty 1

## 2021-11-14 MED ORDER — LEVETIRACETAM IN NACL 1500 MG/100ML IV SOLN
1500.0000 mg | Freq: Once | INTRAVENOUS | Status: AC
  Administered 2021-11-14: 1500 mg via INTRAVENOUS
  Filled 2021-11-14: qty 100

## 2021-11-14 MED ORDER — LACTATED RINGERS IV BOLUS
1000.0000 mL | Freq: Once | INTRAVENOUS | Status: AC
  Administered 2021-11-14: 1000 mL via INTRAVENOUS

## 2021-11-14 MED ORDER — LACOSAMIDE 100 MG PO TABS: 1.0000 | ORAL_TABLET | Freq: Two times a day (BID) | ORAL | 0 refills | Status: AC

## 2021-11-14 MED ORDER — LORAZEPAM 2 MG/ML IJ SOLN
1.0000 mg | Freq: Once | INTRAMUSCULAR | Status: AC
  Administered 2021-11-14: 1 mg via INTRAVENOUS
  Filled 2021-11-14: qty 1

## 2021-11-14 NOTE — ED Notes (Signed)
Patient transported to CT 

## 2021-11-14 NOTE — ED Notes (Signed)
Pure wick was placed on patient, with suction on 90.

## 2021-11-14 NOTE — ED Notes (Signed)
[facility] called  

## 2021-11-14 NOTE — Discharge Instructions (Signed)
Resume taking Decadron at home.  Continue taking Keppra for seizure prevention.  Additionally, there is a prescription sent to your pharmacy for another medication to prevent seizures.  Work with hospice nursing to maximize comfort.  Return to the emergency department as needed.

## 2021-11-14 NOTE — ED Provider Notes (Signed)
Patient assumed from Dr. Sherron Monday at 7:15 AM.  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  She is currently on hospice care.  She has developed some new symptoms that include right facial and upper extremity tremors and dysarthria.  She has reportedly not been on her Decadron at home.  She was given Keppra load and Decadron here in the ED.  Neurology recommended Vimpat as well.  Reassess at 8:00 AM to see if there is improvement in symptoms if not, will reengage neurology. Physical Exam  BP (!) 105/38   Pulse (!) 54   Temp (!) 97.5 F (36.4 C) (Oral)   Resp 15   SpO2 100%   Physical Exam Constitutional:      General: She is sleeping. She is not in acute distress.    Appearance: She is ill-appearing (Chronically). She is not toxic-appearing or diaphoretic.  HENT:     Head: Normocephalic and atraumatic.     Right Ear: External ear normal.     Left Ear: External ear normal.     Nose: Nose normal.     Mouth/Throat:     Mouth: Mucous membranes are moist.  Eyes:     Extraocular Movements: Extraocular movements intact.  Cardiovascular:     Rate and Rhythm: Regular rhythm. Bradycardia present.  Pulmonary:     Effort: Pulmonary effort is normal.  Abdominal:     Palpations: Abdomen is soft.  Musculoskeletal:     Cervical back: No rigidity.  Skin:    General: Skin is warm and dry.  Neurological:     General: No focal deficit present.     Mental Status: She is oriented to person, place, and time and easily aroused.  Psychiatric:        Mood and Affect: Mood normal.        Behavior: Behavior normal.        Thought Content: Thought content normal.        Judgment: Judgment normal.    Procedures  Procedures  ED Course / MDM    Medical Decision Making Amount and/or Complexity of Data Reviewed Labs: ordered. Radiology: ordered.  Risk Prescription drug management.   Following dose of Vimpat, patient had resolution of arm and face tremoring.  Her  granddaughter, who is present at bedside, reports that her speech has improved.  Patient, on assessment, is sleeping but is easily arousable.  She is alert and oriented.  She states that she would like to go home.  Granddaughter is in agreement.  Blood pressures are soft which granddaughter states is her baseline.  Prescription for Vimpat was sent.  Patient was advised to resume Decadron, to continue Keppra, and to initiate Vimpat.  Patient to work with home hospice for optimization of palliative care.       Godfrey Pick, MD 11/14/21 [phone]

## 2021-11-14 NOTE — ED Notes (Signed)
Pts Jamie Montoya was adjusted due to her mouth breathing and desating when she falls asleep

## 2021-11-14 NOTE — ED Triage Notes (Addendum)
Pt BIB EMS from home due to right twitching of her right side of face & and tremor of hand.Pt has h/o 5 brain tumors. The tremors and twitching started around 0230 and has been on and off.

## 2021-11-14 NOTE — ED Provider Notes (Incomplete)
[facility] EMERGENCY DEPARTMENT Provider Note   CSN: [id number] Arrival date & time: 11/14/21  0413     History {Add pertinent medical, surgical, social history, OB history to HPI:1} Chief Complaint  Patient presents with   Tremors    Jamie Montoya is a 77 y.o. female.  76 year old female who has known metastatic cancer to the brain was seen here few weeks ago with seizures and is now back today with tremors.  Saw that it started earlier today.  EMS was called brought here for further evaluation.  DNR MOST form at bedside.  Per nursing her tremors have seemed to have gotten worse that she has been here.  Patient is alert and awake but not able to really offer any history secondary to the tremors of her face.  Contact from the chart is not answering the phone at this time.        Home Medications Prior to Admission medications   Medication Sig Start Date End Date Taking? Authorizing Provider  albuterol (PROVENTIL) (2.5 MG/3ML) 0.083% nebulizer solution Take 3 mLs by nebulization 4 (four) times daily as needed for wheezing or shortness of breath. 06/27/16   [provider]  Biotin 5000 MCG TABS Take 5,000 mcg by mouth in the morning.    [provider]  Cholecalciferol (VITAMIN D3) 125 MCG (5000 UT) CAPS Take 5,000 Units by mouth daily.    [provider]  dexamethasone (DECADRON) 0.5 MG tablet Take 4 tablets (2 mg total) by mouth 2 (two) times daily. Patient not taking: Reported on 10/25/2021 06/14/21   [name], MD  dexamethasone (DECADRON) 4 MG tablet Take 1 tablet (4 mg total) by mouth 3 (three) times daily. 10/25/21   [name], MD  levETIRAcetam (KEPPRA) 500 MG tablet Take 1 tablet (500 mg total) by mouth 2 (two) times daily. 06/04/21 10/25/21  [name], MD  levothyroxine (SYNTHROID) 150 MCG tablet Take 150 mcg by mouth daily before breakfast. 11/08/19   [provider]  LORazepam (ATIVAN) 0.5 MG tablet 1 tab  po 30 minutes prior to radiation or MRI scans Patient taking differently: Take 0.5 mg by mouth See admin instructions. Take 0.5 mg by mouth 30 minutes prior to radiation or MRI scans 06/13/21   [name], PA-C  nystatin (MYCOSTATIN/NYSTOP) powder Apply 1 application topically 2 (two) times daily as needed (for itching under stomach fold and groin area).    [provider]  torsemide (DEMADEX) 20 MG tablet Take 1 tablet (20 mg total) by mouth every morning. 09/03/21   [name], MD      Allergies    Tape    Review of Systems   Review of Systems  Physical Exam Updated Vital Signs BP (!) 131/43 (BP Location: Right Arm)   Pulse (!) 57   Temp (!) 97.5 F (36.4 C) (Oral)   Resp 18   SpO2 97%  Physical Exam Vitals and nursing note reviewed.  Constitutional:      Appearance: She is well-developed.  HENT:     Head: Normocephalic and atraumatic.     Mouth/Throat:     Mouth: Mucous membranes are dry.  Cardiovascular:     Rate and Rhythm: Normal rate and regular rhythm.  Pulmonary:     Effort: Pulmonary effort is normal. No respiratory distress.     Breath sounds: No stridor.  Abdominal:     General: Abdomen is flat. There is no distension.  Musculoskeletal:  Cervical back: Normal range of motion.  Skin:    General: Skin is warm and dry.  Neurological:     Mental Status: She is alert.     Comments: Rhtythmic contractions of face, eyebrows and forehead    ED Results / Procedures / Treatments   Labs (all labs ordered are listed, but only abnormal results are displayed) Labs Reviewed  CBC WITH DIFFERENTIAL/PLATELET  COMPREHENSIVE METABOLIC PANEL  LEVETIRACETAM LEVEL    EKG None  Radiology No results found.  Procedures Procedures  {Document cardiac monitor, telemetry assessment procedure when appropriate:1}  Medications Ordered in ED Medications  levETIRAcetam (KEPPRA) IVPB 1500 mg/ 100 mL premix (has no administration in time range)   LORazepam (ATIVAN) injection 1 mg (has no administration in time range)    ED Course/ Medical Decision Making/ A&P                           Medical Decision Making Amount and/or Complexity of Data Reviewed Labs: ordered. Radiology: ordered.  Risk Prescription drug management.  With her history, most likely partial seizures. Will load keppra/ativan in lieu of getting a hold of family.  Improved symptoms but CT with worsening edema.  Family states that they stopped the Decadron on advice of the hospice nurses the patient is having some side effects to it.  We will give a dose of IV Decadron here. Reevaluation patient still having some what looks like focal seizures in her right facial muscle area.  Still dysarthric.  Discussed with neurologist for over the phone recommendations for a second agent that does not cause too much sleepiness.  She recommended Vimpat, 200 mg IV once and 100 mg twice daily if it works.  She states that if it does not work within 40 to 60 minutes of infusing that they need to get a call back because the quicker thinking since get her out of status the better off she will be. ***  {Document critical care time when appropriate:1} {Document review of labs and clinical decision tools ie heart score, Chads2Vasc2 etc:1}  {Document your independent review of radiology images, and any outside records:1} {Document your discussion with family members, caretakers, and with consultants:1} {Document social determinants of health affecting pt's care:1} {Document your decision making why or why not admission, treatments were needed:1} Final Clinical Impression(s) / ED Diagnoses Final diagnoses:  None    Rx / DC Orders ED Discharge Orders     None

## 2021-11-15 LAB — LEVETIRACETAM LEVEL: Levetiracetam Lvl: 56.9 ug/mL — ABNORMAL HIGH (ref 10.0–40.0)

## 2021-11-16 ENCOUNTER — Other Ambulatory Visit: Payer: Self-pay

## 2021-11-16 MED ORDER — TORSEMIDE 20 MG PO TABS: 20.0000 mg | ORAL_TABLET | Freq: Every morning | ORAL | 0 refills | Status: AC

## 2021-12-22 DEATH — deceased
